# Patient Record
Sex: Male | Born: 1980 | Race: White | Hispanic: No | Marital: Single | Smoking: Current every day smoker
Health system: Southern US, Community
[De-identification: ages and names within clinical notes are randomized; demographics above are authoritative.]

## PROBLEM LIST (undated history)

## (undated) DIAGNOSIS — G43909 Migraine, unspecified, not intractable, without status migrainosus: Secondary | ICD-10-CM

## (undated) DIAGNOSIS — F329 Major depressive disorder, single episode, unspecified: Secondary | ICD-10-CM

## (undated) DIAGNOSIS — F32A Depression, unspecified: Secondary | ICD-10-CM

## (undated) DIAGNOSIS — F419 Anxiety disorder, unspecified: Secondary | ICD-10-CM

## (undated) HISTORY — DX: Migraine, unspecified, not intractable, without status migrainosus: G43.909

---

## 2004-11-25 ENCOUNTER — Emergency Department (HOSPITAL_COMMUNITY): Admission: EM | Admit: 2004-11-25 | Discharge: 2004-11-25 | Payer: Self-pay | Admitting: Emergency Medicine

## 2005-08-05 ENCOUNTER — Emergency Department (HOSPITAL_COMMUNITY): Admission: EM | Admit: 2005-08-05 | Discharge: 2005-08-06 | Payer: Self-pay | Admitting: *Deleted

## 2008-06-07 ENCOUNTER — Emergency Department (HOSPITAL_COMMUNITY): Admission: EM | Admit: 2008-06-07 | Discharge: 2008-06-07 | Payer: Self-pay | Admitting: Emergency Medicine

## 2008-06-19 ENCOUNTER — Emergency Department (HOSPITAL_COMMUNITY): Admission: EM | Admit: 2008-06-19 | Discharge: 2008-06-19 | Payer: Self-pay | Admitting: Emergency Medicine

## 2008-07-25 ENCOUNTER — Emergency Department (HOSPITAL_COMMUNITY): Admission: EM | Admit: 2008-07-25 | Discharge: 2008-07-25 | Payer: Self-pay | Admitting: Emergency Medicine

## 2008-07-26 ENCOUNTER — Emergency Department (HOSPITAL_COMMUNITY): Admission: EM | Admit: 2008-07-26 | Discharge: 2008-07-26 | Payer: Self-pay | Admitting: Emergency Medicine

## 2013-04-26 ENCOUNTER — Emergency Department (HOSPITAL_COMMUNITY)
Admission: EM | Admit: 2013-04-26 | Discharge: 2013-04-26 | Disposition: A | Discharge status: Home / Self Care | Payer: Self-pay | Attending: Emergency Medicine | Admitting: Emergency Medicine

## 2013-04-26 ENCOUNTER — Encounter (HOSPITAL_COMMUNITY): Payer: Self-pay | Admitting: *Deleted

## 2013-04-26 DIAGNOSIS — K047 Periapical abscess without sinus: Secondary | ICD-10-CM | POA: Insufficient documentation

## 2013-04-26 DIAGNOSIS — F17200 Nicotine dependence, unspecified, uncomplicated: Secondary | ICD-10-CM | POA: Insufficient documentation

## 2013-04-26 DIAGNOSIS — Z79899 Other long term (current) drug therapy: Secondary | ICD-10-CM | POA: Insufficient documentation

## 2013-04-26 DIAGNOSIS — K029 Dental caries, unspecified: Secondary | ICD-10-CM | POA: Insufficient documentation

## 2013-04-26 MED ORDER — OXYCODONE-ACETAMINOPHEN 5-325 MG PO TABS: 2.0000 | ORAL_TABLET | ORAL | Status: DC | PRN

## 2013-04-26 MED ORDER — IBUPROFEN 800 MG PO TABS: 800.0000 mg | ORAL_TABLET | Freq: Three times a day (TID) | ORAL | Status: DC

## 2013-04-26 MED ORDER — OXYCODONE-ACETAMINOPHEN 5-325 MG PO TABS
2.0000 | ORAL_TABLET | Freq: Once | ORAL | Status: AC
  Administered 2013-04-26: 2 via ORAL
  Filled 2013-04-26: qty 2

## 2013-04-26 MED ORDER — PENICILLIN V POTASSIUM 500 MG PO TABS: 500.0000 mg | ORAL_TABLET | Freq: Four times a day (QID) | ORAL | Status: AC

## 2013-04-26 NOTE — ED Notes (Signed)
Pt alert & oriented x4, stable gait. Patient given discharge instructions, paperwork & prescription(s). Patient  instructed to stop at the registration desk to finish any additional paperwork. Patient verbalized understanding. Pt left department w/ no further questions. 

## 2013-04-26 NOTE — ED Notes (Signed)
Pt reprtrs teeth hurting for 1 week. Pt has not contacted a dentist. Pt states problem is on the left top & bottom.

## 2013-04-26 NOTE — ED Provider Notes (Signed)
History     CSN: [id number]  Arrival date & time 04/26/13  0203   First MD Initiated Contact with Patient 04/26/13 [phone]      Chief Complaint  Patient presents with  . Dental Pain    (Consider location/radiation/quality/duration/timing/severity/associated sxs/prior treatment) HPI History is provided by the patient. Dental pain onset about 6 days ago progressively getting worse affecting left upper and lower mouth. No fevers. No vomiting. No difficulty swallowing or breathing. It hurts to chew. Pain is sharp in quality and not radiating. No trauma. Pain is moderate to severe. Worse tonight.  History reviewed. No pertinent past medical history.  History reviewed. No pertinent past surgical history.  No family history on file.  History  Substance Use Topics  . Smoking status: Current Every Day Smoker -- 1.00 packs/day    Types: Cigarettes  . Smokeless tobacco: Not on file  . Alcohol Use: No      Review of Systems  Constitutional: Negative for fever and chills.  HENT: Positive for dental problem. Negative for sore throat, drooling, mouth sores, neck pain and neck stiffness.   Eyes: Negative for pain.  Respiratory: Negative for shortness of breath.   Cardiovascular: Negative for chest pain.  Gastrointestinal: Negative for abdominal pain.  Genitourinary: Negative for dysuria.  Musculoskeletal: Negative for back pain.  Skin: Negative for rash.  Neurological: Negative for headaches.  All other systems reviewed and are negative.    Allergies  Review of patient's allergies indicates no known allergies.  Home Medications   Current Outpatient Rx  Name  Route  Sig  Dispense  Refill  . ibuprofen (ADVIL,MOTRIN) 800 MG tablet   Oral   Take 1 tablet (800 mg total) by mouth 3 (three) times daily.   21 tablet   0   . oxyCODONE-acetaminophen (PERCOCET/ROXICET) 5-325 MG per tablet   Oral   Take 2 tablets by mouth every 4 (four) hours as needed for pain.   6 tablet   0    . penicillin v potassium (VEETID) 500 MG tablet   Oral   Take 1 tablet (500 mg total) by mouth 4 (four) times daily.   40 tablet   0     BP 131/71  Pulse 79  Temp(Src) 97.8 F (36.6 C) (Oral)  Resp 20  Ht 5\' 10"  (1.778 m)  Wt 187 lb (84.823 kg)  BMI 26.83 kg/m2  SpO2 97%  Physical Exam  Constitutional: He is oriented to person, place, and time. He appears well-developed and well-nourished.  HENT:  Head: Normocephalic and atraumatic.  Very poor dentition, TTP L upper first molar and left lower first molar with severe decay, no gingival fluctuance. uvula midline, mild trismus  Eyes: EOM are normal. Pupils are equal, round, and reactive to light.  Neck: Neck supple.  Cardiovascular: Regular rhythm and intact distal pulses.   Pulmonary/Chest: Effort normal. No respiratory distress.  Musculoskeletal: Normal range of motion. He exhibits no edema.  Neurological: He is alert and oriented to person, place, and time.  Skin: Skin is warm and dry.    ED Course  Dental Date/Time: 04/26/2013 2:32 AM Performed by: Sunnie Nielsen Authorized by: Sunnie Nielsen Consent: Verbal consent obtained. Risks and benefits: risks, benefits and alternatives were discussed Consent given by: patient Patient understanding: patient states understanding of the procedure being performed Patient consent: the patient's understanding of the procedure matches consent given Procedure consent: procedure consent matches procedure scheduled Required items: required blood products, implants, devices, and special equipment available Patient  identity confirmed: verbally with patient Time out: Immediately prior to procedure a "time out" was called to verify the correct patient, procedure, equipment, support staff and site/side marked as required. Preparation: Patient was prepped and draped in the usual sterile fashion. Local anesthesia used: yes Local anesthetic: bupivacaine 0.5% without epinephrine Anesthetic total:  1.8 ml Patient tolerance: Patient tolerated the procedure well with no immediate complications. Comments: L lower first molar injected 27 G needle L upper first molar injected 27 G needle   (including critical care time)  Percocet by mouth  1. Dental abscess    Plan f/u DDS - referral provided. RX PCN and pain medications  Dental infection precautions provided and verbalized as understood MDM  Dental abscess Dental block Medications provided Vital signs and nursing notes reviewed and considered        Sunnie Nielsen, MD 04/26/13 [phone]

## 2013-09-01 ENCOUNTER — Encounter (HOSPITAL_COMMUNITY): Payer: Self-pay | Admitting: *Deleted

## 2013-09-01 ENCOUNTER — Emergency Department (HOSPITAL_COMMUNITY)
Admission: EM | Admit: 2013-09-01 | Discharge: 2013-09-01 | Disposition: A | Discharge status: Home / Self Care | Payer: Self-pay | Attending: Emergency Medicine | Admitting: Emergency Medicine

## 2013-09-01 DIAGNOSIS — Z792 Long term (current) use of antibiotics: Secondary | ICD-10-CM | POA: Insufficient documentation

## 2013-09-01 DIAGNOSIS — K0381 Cracked tooth: Secondary | ICD-10-CM | POA: Insufficient documentation

## 2013-09-01 DIAGNOSIS — Z791 Long term (current) use of non-steroidal anti-inflammatories (NSAID): Secondary | ICD-10-CM | POA: Insufficient documentation

## 2013-09-01 DIAGNOSIS — K029 Dental caries, unspecified: Secondary | ICD-10-CM | POA: Insufficient documentation

## 2013-09-01 DIAGNOSIS — K089 Disorder of teeth and supporting structures, unspecified: Secondary | ICD-10-CM | POA: Insufficient documentation

## 2013-09-01 DIAGNOSIS — K0889 Other specified disorders of teeth and supporting structures: Secondary | ICD-10-CM

## 2013-09-01 DIAGNOSIS — F17200 Nicotine dependence, unspecified, uncomplicated: Secondary | ICD-10-CM | POA: Insufficient documentation

## 2013-09-01 MED ORDER — HYDROCODONE-ACETAMINOPHEN 5-325 MG PO TABS: 2.0000 | ORAL_TABLET | ORAL | Status: DC | PRN

## 2013-09-01 MED ORDER — AMOXICILLIN 500 MG PO CAPS: 500.0000 mg | ORAL_CAPSULE | Freq: Three times a day (TID) | ORAL | Status: DC

## 2013-09-01 NOTE — ED Provider Notes (Signed)
CSN: [id number]     Arrival date & time 09/01/13  1738 History   First MD Initiated Contact with Patient 09/01/13 1839     Chief Complaint  Patient presents with  . Dental Pain   (Consider location/radiation/quality/duration/timing/severity/associated sxs/prior Treatment) Patient is a 32 y.o. male presenting with tooth pain. The history is provided by the patient. No language interpreter was used.  Dental Pain Location:  Upper Upper teeth location:  1/RU 3rd molar Quality:  Aching Severity:  Moderate Onset quality:  Gradual Duration:  2 days Progression:  Worsening Context: abscess and dental caries   Relieved by:  Nothing Worsened by:  Nothing tried Associated symptoms: facial pain     History reviewed. No pertinent past medical history. History reviewed. No pertinent past surgical history. No family history on file. History  Substance Use Topics  . Smoking status: Current Every Day Smoker -- 1.00 packs/day    Types: Cigarettes  . Smokeless tobacco: Not on file  . Alcohol Use: No    Review of Systems  All other systems reviewed and are negative.    Allergies  Review of patient's allergies indicates no known allergies.  Home Medications   Current Outpatient Rx  Name  Route  Sig  Dispense  Refill  . amoxicillin (AMOXIL) 500 MG capsule   Oral   Take 1 capsule (500 mg total) by mouth 3 (three) times daily.   21 capsule   0   . HYDROcodone-acetaminophen (NORCO/VICODIN) 5-325 MG per tablet   Oral   Take 2 tablets by mouth every 4 (four) hours as needed for pain.   20 tablet   0   . ibuprofen (ADVIL,MOTRIN) 800 MG tablet   Oral   Take 1 tablet (800 mg total) by mouth 3 (three) times daily.   21 tablet   0   . oxyCODONE-acetaminophen (PERCOCET/ROXICET) 5-325 MG per tablet   Oral   Take 2 tablets by mouth every 4 (four) hours as needed for pain.   6 tablet   0    BP 129/82  Pulse 92  Temp(Src) 98.2 F (36.8 C) (Oral)  Resp 16  Wt 187 lb (84.823 kg)   BMI 26.83 kg/m2  SpO2 98% Physical Exam  Nursing note and vitals reviewed. Constitutional: He appears well-developed and well-nourished.  HENT:  Head: Normocephalic and atraumatic.  Multiple broken decayed teeth.   Eyes: Pupils are equal, round, and reactive to light.  Neck: Normal range of motion.  Cardiovascular: Normal rate.   Pulmonary/Chest: Effort normal.  Musculoskeletal: Normal range of motion.  Neurological: He is alert.  Skin: Skin is warm.    ED Course  Procedures (including critical care time) Labs Review Labs Reviewed - No data to display Imaging Review No results found.  MDM   1. Tooth pain    amoxicillian and hydrocodone    Elson Areas, [place] 09/01/13 2128

## 2013-09-01 NOTE — ED Notes (Signed)
Dental pain x 2 days

## 2013-09-01 NOTE — ED Notes (Signed)
Here for dental pain. NAD

## 2013-09-03 NOTE — ED Provider Notes (Signed)
Medical screening examination/treatment/procedure(s) were performed by non-physician practitioner and as supervising physician I was immediately available for consultation/collaboration.   Alyss Granato L Sheriff Rodenberg, MD 09/03/13 0804 

## 2013-09-06 ENCOUNTER — Emergency Department (HOSPITAL_COMMUNITY)
Admission: EM | Admit: 2013-09-06 | Discharge: 2013-09-06 | Disposition: A | Discharge status: Home / Self Care | Payer: Self-pay | Attending: Emergency Medicine | Admitting: Emergency Medicine

## 2013-09-06 ENCOUNTER — Encounter (HOSPITAL_COMMUNITY): Payer: Self-pay | Admitting: Emergency Medicine

## 2013-09-06 DIAGNOSIS — K0889 Other specified disorders of teeth and supporting structures: Secondary | ICD-10-CM

## 2013-09-06 DIAGNOSIS — K029 Dental caries, unspecified: Secondary | ICD-10-CM | POA: Insufficient documentation

## 2013-09-06 DIAGNOSIS — K089 Disorder of teeth and supporting structures, unspecified: Secondary | ICD-10-CM | POA: Insufficient documentation

## 2013-09-06 DIAGNOSIS — F17200 Nicotine dependence, unspecified, uncomplicated: Secondary | ICD-10-CM | POA: Insufficient documentation

## 2013-09-06 DIAGNOSIS — Z792 Long term (current) use of antibiotics: Secondary | ICD-10-CM | POA: Insufficient documentation

## 2013-09-06 DIAGNOSIS — Z791 Long term (current) use of non-steroidal anti-inflammatories (NSAID): Secondary | ICD-10-CM | POA: Insufficient documentation

## 2013-09-06 MED ORDER — OXYCODONE-ACETAMINOPHEN 5-325 MG PO TABS
1.0000 | ORAL_TABLET | Freq: Once | ORAL | Status: AC
  Administered 2013-09-06: 1 via ORAL
  Filled 2013-09-06: qty 1

## 2013-09-06 MED ORDER — CLINDAMYCIN HCL 150 MG PO CAPS
300.0000 mg | ORAL_CAPSULE | Freq: Once | ORAL | Status: AC
  Administered 2013-09-06: 300 mg via ORAL
  Filled 2013-09-06: qty 2

## 2013-09-06 MED ORDER — CLINDAMYCIN HCL 150 MG PO CAPS: 300.0000 mg | ORAL_CAPSULE | Freq: Four times a day (QID) | ORAL | Status: DC

## 2013-09-06 MED ORDER — TRAMADOL HCL 50 MG PO TABS: 50.0000 mg | ORAL_TABLET | Freq: Four times a day (QID) | ORAL | Status: DC | PRN

## 2013-09-06 NOTE — ED Notes (Signed)
Continued dental pain since last visit.

## 2013-09-06 NOTE — ED Provider Notes (Signed)
CSN: [id number]     Arrival date & time 09/06/13  1508 History   First MD Initiated Contact with Patient 09/06/13 1620     Chief Complaint  Patient presents with  . Dental Pain   (Consider location/radiation/quality/duration/timing/severity/associated sxs/prior Treatment) HPI Comments: Angel [name] is a 32 y.o. male who presents to the Emergency Department complaining of dental pain for one week.  States the medication he received on previous visit are not helping the pain and he has ran out of the vicodin.  He states that he cannot afford dental follow-up.  Cold or hot liquids or food make the pain worse, nothing makes it better.  He denies fever, facial swelling, difficulty swallowing or breathing  Patient is a 31 y.o. male presenting with tooth pain. The history is provided by the patient.  Dental Pain Associated symptoms: no congestion, no facial swelling, no fever, no headaches and no neck pain     History reviewed. No pertinent past medical history. History reviewed. No pertinent past surgical history. No family history on file. History  Substance Use Topics  . Smoking status: Current Every Day Smoker -- 1.00 packs/day    Types: Cigarettes  . Smokeless tobacco: Not on file  . Alcohol Use: No    Review of Systems  Constitutional: Negative for fever and appetite change.  HENT: Positive for dental problem. Negative for congestion, facial swelling, sore throat and trouble swallowing.   Eyes: Negative for pain and visual disturbance.  Musculoskeletal: Negative for neck pain and neck stiffness.  Neurological: Negative for dizziness, facial asymmetry and headaches.  Hematological: Negative for adenopathy.  All other systems reviewed and are negative.    Allergies  Review of patient's allergies indicates no known allergies.  Home Medications   Current Outpatient Rx  Name  Route  Sig  Dispense  Refill  . amoxicillin (AMOXIL) 500 MG capsule   Oral   Take 1 capsule (500  mg total) by mouth 3 (three) times daily.   21 capsule   0   . HYDROcodone-acetaminophen (NORCO/VICODIN) 5-325 MG per tablet   Oral   Take 2 tablets by mouth every 4 (four) hours as needed for pain.   20 tablet   0   . ibuprofen (ADVIL,MOTRIN) 800 MG tablet   Oral   Take 1 tablet (800 mg total) by mouth 3 (three) times daily.   21 tablet   0   . oxyCODONE-acetaminophen (PERCOCET/ROXICET) 5-325 MG per tablet   Oral   Take 2 tablets by mouth every 4 (four) hours as needed for pain.   6 tablet   0    BP 117/77  Pulse 85  Temp(Src) 98 F (36.7 C) (Oral)  Resp 18  Ht 5\' 5"  (1.651 m)  Wt 182 lb (82.555 kg)  BMI 30.29 kg/m2  SpO2 100% Physical Exam  Nursing note and vitals reviewed. Constitutional: He is oriented to person, place, and time. He appears well-developed and well-nourished. No distress.  HENT:  Head: Normocephalic and atraumatic.  Right Ear: Tympanic membrane and ear canal normal.  Left Ear: Tympanic membrane and ear canal normal.  Mouth/Throat: Uvula is midline, oropharynx is clear and moist and mucous membranes are normal. No trismus in the jaw. Dental caries present. No dental abscesses or uvula swelling.  Multiple dental caries of the left upper and lower premolars and molars.  Discoloration to the remaining teeth.  No facial swelling, obvious dental abscess, trismus, or sublingual abnml. Pt handles secretions well  Neck: Normal range of motion. Neck supple.  Cardiovascular: Normal rate, regular rhythm and normal heart sounds.   No murmur heard. Pulmonary/Chest: Effort normal and breath sounds normal. No respiratory distress.  Musculoskeletal: Normal range of motion.  Lymphadenopathy:    He has no cervical adenopathy.  Neurological: He is alert and oriented to person, place, and time. He exhibits normal muscle tone. Coordination normal.  Skin: Skin is warm and dry.    ED Course  Procedures (including critical care time) Labs Review Labs Reviewed - No  data to display Imaging Review No results found.    MDM  Previous ED chart reviewed.   Multiple dental caries and ttp of the left upper and lower gums.  No drainable abscess at this time.  Pt given referral info.  Will prescribe clindamycin and ultram.  Pt understands that further narcotics are not indicated at this time.    VSS.  Pt appears stable for discharge.  Tiziana Cislo L. [place] Youngblood, PA-C 09/08/13 0016

## 2013-09-08 NOTE — ED Provider Notes (Signed)
Medical screening examination/treatment/procedure(s) were performed by non-physician practitioner and as supervising physician I was immediately available for consultation/collaboration.  Temprance Wyre R. Ostin Mathey, MD 09/08/13 1515 

## 2013-10-31 ENCOUNTER — Encounter (HOSPITAL_COMMUNITY): Payer: Self-pay | Admitting: Behavioral Health

## 2013-10-31 ENCOUNTER — Encounter (HOSPITAL_COMMUNITY): Payer: Self-pay | Admitting: Emergency Medicine

## 2013-10-31 ENCOUNTER — Emergency Department (HOSPITAL_COMMUNITY)
Admission: EM | Admit: 2013-10-31 | Discharge: 2013-10-31 | Disposition: A | Discharge status: Other Acute Inpatient Hospital | Payer: Self-pay | Attending: Emergency Medicine | Admitting: Emergency Medicine

## 2013-10-31 ENCOUNTER — Inpatient Hospital Stay (HOSPITAL_COMMUNITY)
Admission: AD | Admit: 2013-10-31 | Discharge: 2013-11-06 | DRG: 885 | Disposition: A | Discharge status: Home / Self Care | Payer: 59 | Source: Intra-hospital | Attending: Psychiatry | Admitting: Psychiatry

## 2013-10-31 DIAGNOSIS — F411 Generalized anxiety disorder: Secondary | ICD-10-CM | POA: Insufficient documentation

## 2013-10-31 DIAGNOSIS — R45851 Suicidal ideations: Secondary | ICD-10-CM

## 2013-10-31 DIAGNOSIS — X789XXA Intentional self-harm by unspecified sharp object, initial encounter: Secondary | ICD-10-CM | POA: Insufficient documentation

## 2013-10-31 DIAGNOSIS — F332 Major depressive disorder, recurrent severe without psychotic features: Principal | ICD-10-CM | POA: Diagnosis present

## 2013-10-31 DIAGNOSIS — Z5987 Material hardship due to limited financial resources, not elsewhere classified: Secondary | ICD-10-CM

## 2013-10-31 DIAGNOSIS — F17200 Nicotine dependence, unspecified, uncomplicated: Secondary | ICD-10-CM | POA: Insufficient documentation

## 2013-10-31 DIAGNOSIS — Z598 Other problems related to housing and economic circumstances: Secondary | ICD-10-CM

## 2013-10-31 DIAGNOSIS — Z79899 Other long term (current) drug therapy: Secondary | ICD-10-CM

## 2013-10-31 DIAGNOSIS — S51809A Unspecified open wound of unspecified forearm, initial encounter: Secondary | ICD-10-CM | POA: Insufficient documentation

## 2013-10-31 LAB — BASIC METABOLIC PANEL
CO2: 30 mEq/L (ref 19–32)
Calcium: 9 mg/dL (ref 8.4–10.5)
Creatinine, Ser: 0.9 mg/dL (ref 0.50–1.35)
GFR calc Af Amer: >90 mL/min (ref >90)
GFR calc non Af Amer: >90 mL/min (ref >90)
Sodium: 138 mEq/L (ref 135–145)

## 2013-10-31 LAB — CBC WITH DIFFERENTIAL/PLATELET
Basophils Absolute: 0 10*3/uL (ref 0.0–0.1)
Basophils Relative: 1 % (ref 0–1)
Eosinophils Absolute: 0.1 10*3/uL (ref 0.0–0.7)
Eosinophils Relative: 2 % (ref 0–5)
HCT: 46.5 % (ref 39.0–52.0)
Lymphocytes Relative: 43 % (ref 12–46)
MCH: 30.7 pg (ref 26.0–34.0)
MCHC: 35.1 g/dL (ref 30.0–36.0)
MCV: 87.6 fL (ref 78.0–100.0)
Monocytes Absolute: 0.5 10*3/uL (ref 0.1–1.0)
Platelets: 188 10*3/uL (ref 150–400)
RDW: 13.5 % (ref 11.5–15.5)
WBC: 5.5 10*3/uL (ref 4.0–10.5)

## 2013-10-31 LAB — HEPATIC FUNCTION PANEL
Albumin: 4 g/dL (ref 3.5–5.2)
Alkaline Phosphatase: 66 U/L (ref 39–117)
Bilirubin, Direct: <0.1 mg/dL (ref 0.0–0.3)
Total Bilirubin: 0.3 mg/dL (ref 0.3–1.2)

## 2013-10-31 LAB — ETHANOL: Alcohol, Ethyl (B): <11 mg/dL (ref 0–11)

## 2013-10-31 LAB — ACETAMINOPHEN LEVEL: Acetaminophen (Tylenol), Serum: <15 ug/mL (ref 10–30)

## 2013-10-31 MED ORDER — ALUM & MAG HYDROXIDE-SIMETH 200-200-20 MG/5ML PO SUSP: 30.0000 mL | ORAL | Status: DC | PRN

## 2013-10-31 MED ORDER — HYDROXYZINE HCL 25 MG PO TABS
25.0000 mg | ORAL_TABLET | Freq: Four times a day (QID) | ORAL | Status: DC | PRN
  Administered 2013-11-03 – 2013-11-05 (×4): 25 mg via ORAL
  Filled 2013-10-31: qty 1
  Filled 2013-10-31: qty 28
  Filled 2013-10-31 (×3): qty 1

## 2013-10-31 MED ORDER — NICOTINE 21 MG/24HR TD PT24
MEDICATED_PATCH | TRANSDERMAL | Status: AC
  Filled 2013-10-31: qty 1

## 2013-10-31 MED ORDER — ACETAMINOPHEN 325 MG PO TABS
650.0000 mg | ORAL_TABLET | Freq: Four times a day (QID) | ORAL | Status: DC | PRN
  Administered 2013-11-01 – 2013-11-03 (×2): 650 mg via ORAL
  Filled 2013-10-31 (×2): qty 2

## 2013-10-31 MED ORDER — TRAZODONE HCL 100 MG PO TABS
100.0000 mg | ORAL_TABLET | Freq: Every evening | ORAL | Status: DC | PRN
  Administered 2013-10-31: 100 mg via ORAL
  Filled 2013-10-31: qty 1

## 2013-10-31 MED ORDER — NICOTINE 21 MG/24HR TD PT24
21.0000 mg | MEDICATED_PATCH | Freq: Every day | TRANSDERMAL | Status: DC
  Administered 2013-10-31 – 2013-11-06 (×6): 21 mg via TRANSDERMAL
  Filled 2013-10-31 (×10): qty 1

## 2013-10-31 MED ORDER — BUPROPION HCL ER (XL) 150 MG PO TB24
150.0000 mg | ORAL_TABLET | Freq: Every day | ORAL | Status: DC
  Administered 2013-10-31 – 2013-11-01 (×2): 150 mg via ORAL
  Filled 2013-10-31 (×5): qty 1

## 2013-10-31 MED ORDER — BUTALBITAL-APAP-CAFFEINE 50-325-40 MG PO TABS: 1.0000 | ORAL_TABLET | Freq: Four times a day (QID) | ORAL | Status: DC | PRN

## 2013-10-31 MED ORDER — MAGNESIUM HYDROXIDE 400 MG/5ML PO SUSP: 30.0000 mL | Freq: Every day | ORAL | Status: DC | PRN

## 2013-10-31 MED ORDER — BUTALBITAL-APAP-CAFFEINE 50-325-40 MG PO TABS: 2.0000 | ORAL_TABLET | Freq: Four times a day (QID) | ORAL | Status: DC | PRN

## 2013-10-31 MED ORDER — BACITRACIN-NEOMYCIN-POLYMYXIN 400-5-5000 EX OINT
TOPICAL_OINTMENT | Freq: Two times a day (BID) | CUTANEOUS | Status: DC
  Administered 2013-10-31 – 2013-11-06 (×12): 1 via TOPICAL
  Filled 2013-10-31 (×5): qty 1

## 2013-10-31 NOTE — Progress Notes (Signed)
Patient ID: Angel [name], male   DOB: [birth year], 33 y.o.   MRN: [id number] This is a 32 year old male admitted for increased suicidal ideations after losing his job and his girlfriend. Pt mood is depressed and his affect is sad/depressed. Pt reports passive suicidal thoughts but is able to contract for safety. Pt denies drug and ETOH use. Pt has a hx of migraine HA. Pt has poor eye contact and forwards little to staff. Food and drink were provided. Writer oriented pt to the milieu and 15 minute checks were intiated for safety. Also, pt has a small self inflicted laceration to left forearm that reopened after taking a shower. Provider notified.
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
## 2013-10-31 NOTE — BH Assessment (Signed)
Tele Assessment Note   Angel [name] is an 32 y.o. male that was assessed via tele assessment this day per EDP Rancour.  This clinician obtained clinical information from EDP Rancour @ [phone].  Pt presents with suicide attempt by cutting his right arm superficially with broken glass.  Pt is still suicidal and unable to contract for safety.  Pt has a hx of a previous suicide attempt.  Pt stated he has been feeling sucidal for about 2 mos, since a break up and losing his job, but has been feeling depressed for about one year by report.  Pt has had no other mental health treatment other than being hospitalized at [facility] at age 50 for a previous suicide attempt due to depression.  Pt denies HI or psychosis.  Pt denies SA.  Pt is tearful and presents with depressed affect during assessment.  Pt has little support and is living with friends.  Pt stated he wants help.  Pt stated his only medical problem is migraines and pt cannot remember the medication he takes for this.  Pt self-referred to [facility].  Completed tele assessment, consulted with [facility] Berneice Heinrich and pt ran by Nanine Means, NP, @ [phone], who accepted pt to bed 506-2 to Dr. Elsie Saas.  Informed pt's nurse, Darel Hong, and she will arrange transport with [facility] to [facility] once pt's voluntary paperwork obtained.  Updated TTS staff and [facility] staff, including EDp Rancour.   Axis I: 296.33 Major Depressive Disorder, Recurrent, Severe Without Psychotic Features Axis II: Deferred Axis III: History reviewed. No pertinent past medical history. Axis IV: economic problems, housing problems, occupational problems, other psychosocial or environmental problems, problems related to social environment, problems with access to health care services and problems with primary support group Axis V: 21-30 behavior considerably influenced by delusions or hallucinations OR serious impairment in judgment, communication OR inability to function in almost all areas  Past  Medical History: History reviewed. No pertinent past medical history.  History reviewed. No pertinent past surgical history.  Family History: No family history on file.  Social History:  reports that he has been smoking Cigarettes.  He has been smoking about 1.00 pack per day. He does not have any smokeless tobacco history on file. He reports that he does not drink alcohol or use illicit drugs.  Additional Social History:  Alcohol / Drug Use Pain Medications: see MAR Prescriptions: see MAR Over the Counter: see MAR History of alcohol / drug use?: No history of alcohol / drug abuse Longest period of sobriety (when/how long):  (na) Negative Consequences of Use:  (na) Withdrawal Symptoms:  (na)  CIWA: CIWA-Ar BP: 107/74 mmHg Pulse Rate: 99 COWS:    Allergies: No Known Allergies  Home Medications:  (Not in a hospital admission)  OB/GYN Status:  No LMP for male patient.  General Assessment Data Location of Assessment: [facility] Is this a Tele or Face-to-Face Assessment?: Tele Assessment Is this an Initial Assessment or a Re-assessment for this encounter?: Initial Assessment Living Arrangements: Non-relatives/Friends Can pt return to current living arrangement?: Yes Admission Status: Voluntary Is patient capable of signing voluntary admission?: Yes Transfer from: Acute Hospital Referral Source: Self/Family/Friend  Medical Screening Exam [facility] Walk-in ONLY) Medical Exam completed: No Reason for MSE not completed: Other: (pt med cleared at [facility])  [facility] Crisis Care Plan Living Arrangements: Non-relatives/Friends Name of Psychiatrist: none Name of Therapist: none  Education Status Is patient currently in school?: No  Risk to self Suicidal Ideation: Yes-Currently Present  Suicidal Intent: Yes-Currently Present Is patient at risk for suicide?: Yes Suicidal Plan?: Yes-Currently Present Specify Current Suicidal Plan: pt cut self on arm superficially with glass Access to Means:  Yes Specify Access to Suicidal Means: sharps What has been your use of drugs/alcohol within the last 12 months?: pt denies Previous Attempts/Gestures: Yes How many times?: 1 (Age 43 - tried to cut wrists) Other Self Harm Risks: pt denies Triggers for Past Attempts: Other (Comment) (Depression) Intentional Self Injurious Behavior: None Family Suicide History: No Recent stressful life event(s): Loss (Comment);Financial Problems;Turmoil (Comment) (SI, recent breakup, no job, living with friends) Persecutory voices/beliefs?: No Depression: Yes Depression Symptoms: Despondent;Insomnia;Tearfulness;Isolating;Fatigue;Loss of interest in usual pleasures;Feeling worthless/self pity Substance abuse history and/or treatment for substance abuse?: No Suicide prevention information given to non-admitted patients: Not applicable  Risk to Others Homicidal Ideation: No Thoughts of Harm to Others: No Current Homicidal Intent: No Current Homicidal Plan: No Access to Homicidal Means: No Identified Victim: pt denies History of harm to others?: No Assessment of Violence: None Noted Violent Behavior Description: na - pt calm, cooperative Does patient have access to weapons?: No Criminal Charges Pending?: No Does patient have a court date: No  Psychosis Hallucinations: None noted Delusions: None noted  Mental Status Report Appear/Hygiene: Disheveled Eye Contact: Good Motor Activity: Freedom of movement;Unremarkable Speech: Logical/coherent;Soft Level of Consciousness: Quiet/awake;Crying Mood: Depressed;Sad Affect: Depressed;Sad;Appropriate to circumstance Anxiety Level: None Thought Processes: Coherent;Relevant Judgement: Unimpaired Orientation: Person;Place;Time;Situation;Appropriate for developmental age Obsessive Compulsive Thoughts/Behaviors: None  Cognitive Functioning Concentration: Decreased Memory: Recent Intact;Remote Intact IQ: Average Insight: Poor Impulse Control:  Fair Appetite: Fair Weight Loss:  (pt stated he has lost, but not sure how much) Weight Gain: 0 Sleep: Decreased Total Hours of Sleep:  (Between 4-8 hrs per night) Vegetative Symptoms: Staying in bed;Not bathing;Decreased grooming  ADLScreening [facility] Assessment Services) Patient's cognitive ability adequate to safely complete daily activities?: Yes Patient able to express need for assistance with ADLs?: Yes Independently performs ADLs?: Yes (appropriate for developmental age)  Prior Inpatient Therapy Prior Inpatient Therapy: Yes Prior Therapy Dates: Age 68 Prior Therapy Facilty/Provider(s): [facility] Reason for Treatment: Suicide attempt  Prior Outpatient Therapy Prior Outpatient Therapy: No Prior Therapy Dates: na Prior Therapy Facilty/Provider(s): na Reason for Treatment: na  ADL Screening (condition at time of admission) Patient's cognitive ability adequate to safely complete daily activities?: Yes Is the patient deaf or have difficulty hearing?: No Does the patient have difficulty seeing, even when wearing glasses/contacts?: No Does the patient have difficulty concentrating, remembering, or making decisions?: No Patient able to express need for assistance with ADLs?: Yes Does the patient have difficulty dressing or bathing?: No Independently performs ADLs?: Yes (appropriate for developmental age) Does the patient have difficulty walking or climbing stairs?: No  Home Assistive Devices/Equipment Home Assistive Devices/Equipment: None    Abuse/Neglect Assessment (Assessment to be complete while patient is alone) Physical Abuse: Denies Verbal Abuse: Denies Sexual Abuse: Denies Exploitation of patient/patient's resources: Denies Self-Neglect: Denies Values / Beliefs Cultural Requests During Hospitalization: None Spiritual Requests During Hospitalization: None Consults Spiritual Care Consult Needed: No Social Work Consult Needed: No Merchant navy officer (For  Healthcare) Advance Directive: Patient does not have advance directive;Patient would not like information    Additional Information 1:1 In Past 12 Months?: No CIRT Risk: No Elopement Risk: No Does patient have medical clearance?: Yes     Disposition:  Disposition Initial Assessment Completed for this Encounter: Yes Disposition of Patient: Inpatient treatment program Type of inpatient treatment program: Adult (Pt accepted  [facility])  Hetty Ely [facility] 10/31/2013 10:36 AM

## 2013-10-31 NOTE — ED Notes (Signed)
Pt has been accepted at [facility].  

## 2013-10-31 NOTE — ED Notes (Signed)
I spoke with [facility] at this time will send truck within 45 mins. Felicia Both

## 2013-10-31 NOTE — ED Notes (Signed)
Pt states a lot of things have happened in his life but will not say what. States " life stinks " pt avoids direst eye contact. Pt made aware of guidelines pertaining to his care

## 2013-10-31 NOTE — Tx Team (Signed)
Initial Interdisciplinary Treatment Plan  PATIENT STRENGTHS: (choose at least two) Average or above average intelligence Capable of independent living Motivation for treatment/growth  PATIENT STRESSORS: Loss of Girlfriend and job   PROBLEM LIST: Problem List/Patient Goals Date to be addressed Date deferred Reason deferred Estimated date of resolution  Suicidal Ideations 10/31/2013   11/07/2013  Depression 10/31/2013   11/07/2013  Loss of Relationship and Job 10/31/2013   11/07/2013                                       DISCHARGE CRITERIA:  Improved stabilization in mood, thinking, and/or behavior Motivation to continue treatment in a less acute level of care Need for constant or close observation no longer present Reduction of life-threatening or endangering symptoms to within safe limits Verbal commitment to aftercare and medication compliance  PRELIMINARY DISCHARGE PLAN: Outpatient therapy Return to previous living arrangement  PATIENT/FAMIILY INVOLVEMENT: This treatment plan has been presented to and reviewed with the patient, Angel [name], and/or family member.  The patient and family have been given the opportunity to ask questions and make suggestions.  Bryan Omura Shari Prows 10/31/2013, 2:31 PM

## 2013-10-31 NOTE — ED Provider Notes (Signed)
CSN: [id number]     Arrival date & time 10/31/13  4098 History  This chart was scribed for Glynn Octave, MD by Leone Payor, ED Scribe. This patient was seen in room APA15/[facility] and the patient's care was started 8:59 AM.    Chief Complaint  Patient presents with  . V70.1    HPI  HPI Comments: Angel [name] is a 32 y.o. male who presents to the Emergency Department complaining of ongoing, constant, unchanged SI that began about 1 month ago. Pt states he has a suicide plan that involved cutting his wrist. He reports cutting his left forearm this morning with a piece of broken glass. He denies having a history of depression or taking any medications for this. He denies taking any medications that were not prescribed to him. Pt states he lives with friends and states there is not a gun in the house. He denies HI, auditory or visual hallucinations.   History reviewed. No pertinent past medical history. History reviewed. No pertinent past surgical history. No family history on file. History  Substance Use Topics  . Smoking status: Current Every Day Smoker -- 1.00 packs/day    Types: Cigarettes  . Smokeless tobacco: Not on file  . Alcohol Use: No    Review of Systems A complete 10 system review of systems was obtained and all systems are negative except as noted in the HPI and PMH.   Allergies  Review of patient's allergies indicates no known allergies.  Home Medications   No current outpatient prescriptions on file. BP 113/81  Pulse 73  Temp(Src) 98.1 F (36.7 C) (Oral)  Resp 18  Wt 175 lb (79.379 kg)  SpO2 98% Physical Exam  Nursing note and vitals reviewed. Constitutional: He is oriented to person, place, and time. He appears well-developed and well-nourished. No distress.  HENT:  Head: Normocephalic and atraumatic.  Eyes: EOM are normal.  Neck: Neck supple. No tracheal deviation present.  Cardiovascular: Normal rate.   Pulmonary/Chest: Effort normal. No respiratory  distress.  Musculoskeletal: Normal range of motion.  Neurological: He is alert and oriented to person, place, and time.  Skin: Skin is warm and dry.  Mild superficial lacerations to the right forearm.   Psychiatric: His behavior is normal. His mood appears anxious.  Tearful and anxious.     ED Course  Procedures   DIAGNOSTIC STUDIES: Oxygen Saturation is 100% on RA, normal by my interpretation.    COORDINATION OF CARE: 9:00 AM Will order CBC, BMP, and drug screen. Discussed treatment plan with pt at bedside and pt agreed to plan.  Labs Review Labs Reviewed  SALICYLATE LEVEL - Abnormal; Notable for the following:    Salicylate Lvl <2.0 (*)    All other components within normal limits  CBC WITH DIFFERENTIAL  BASIC METABOLIC PANEL  ETHANOL  ACETAMINOPHEN LEVEL  HEPATIC FUNCTION PANEL  URINE RAPID DRUG SCREEN (HOSP PERFORMED)   Imaging Review No results found.  EKG Interpretation   None       MDM   1. Suicidal ideation    Suicidal thoughts with depression and plan a slight wrists. Denies HIV hallucinations. Denies alcohol or drug abuse.  Small laceration to right forearm. Tetanus is up-to-date.  Screen labs are unremarkable. Patient accepted at behavioral health hospital by Dr. Shela Commons.   I personally performed the services described in this documentation, which was scribed in my presence. The recorded information has been reviewed and is accurate.   Glynn Octave, MD 10/31/13 [phone]

## 2013-10-31 NOTE — [facility] Counselor (Signed)
Adult Comprehensive Assessment  Patient ID: WILLETT LEFEBER, male   DOB: 1981-04-28, 32 y.o.   MRN: [id number]  Information Source:    Current Stressors:  Educational / Learning stressors: None Employment / Job issues: Patient has been unemployed for six months Family Relationships: Family does not contact him unless they have a need Surveyor, quantity / Lack of resources (include bankruptcy): Struggling due to being out of work Housing / Lack of housing: none Physical health (include injuries & life threatening diseases): Non Social relationships: Does not like ot be in crowds Substance abuse: Denies Bereavement / Loss: Denies  Living/Environment/Situation:  Living Arrangements: Non-relatives/Friends Living conditions (as described by patient or guardian): Not good How long has patient lived in current situation?: three months What is atmosphere in current home: Temporary  Family History:  Marital status: Single Does patient have children?: No  Childhood History:  By whom was/is the patient raised?: Both parents Additional childhood history information: Patient reports having a poor and rough childhood Description of patient's relationship with caregiver when they were a child: Good with mother but father was never at home Patient's description of current relationship with people who raised him/her: No relationship with father - mother is deceased Does patient have siblings?: Yes Number of Siblings: 3 Description of patient's current relationship with siblings: Okay relationships Did patient suffer any verbal/emotional/physical/sexual abuse as a child?: No Did patient suffer from severe childhood neglect?: No Has patient ever been sexually abused/assaulted/raped as an adolescent or adult?: No Was the patient ever a victim of a crime or a disaster?: No Witnessed domestic violence?: No Has patient been effected by domestic violence as an adult?: No  Education:  Highest grade of school  patient has completed: 9th Currently a Consulting civil engineer?: No Learning disability?: No  Employment/Work Situation:   Employment situation: Unemployed Patient's job has been impacted by current illness: No What is the longest time patient has a held a job?: 20 years 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 Has patient ever been in the [employer]?: No Has patient ever served in Buyer, retail?: No  Financial Resources:   Surveyor, quantity resources: No income Does patient have a Lawyer or guardian?: No  Alcohol/Substance Abuse:   What has been your use of drugs/alcohol within the last 12 months?: Patient denies If attempted suicide, did drugs/alcohol play a role in this?: No Alcohol/Substance Abuse Treatment Hx: Denies past history Has alcohol/substance abuse ever caused legal problems?: No  Social Support System:   Forensic psychologist System: None Describe Community Support System: N/A Type of faith/religion: None How does patient's faith help to cope with current illness?: N/A  Leisure/Recreation:   Leisure and Hobbies: None  Strengths/Needs:   What things does the patient do well?: Kind hearted In what areas does patient struggle / problems for patient: Everything  Discharge Plan:   Does patient have access to transportation?: Yes Will patient be returning to same living situation after discharge?: Yes Currently receiving community mental health services: No If no, would patient like referral for services when discharged?: Yes (What county?) ([facility] [place]) Does patient have financial barriers related to discharge medications?: Yes Patient description of barriers related to discharge medications: No income or insurance.  Summary/Recommendations:  Jaelon Gatley is a 32 year old male admitted with Major Depression Disorder.  He will benefit from crisis stabilization, evaluation for medication, psycho-education groups for coping skills development, group therapy  and case management for discharge planning.     Allana Shrestha, Joesph July.  10/31/2013 

## 2013-10-31 NOTE — ED Notes (Signed)
Pt c/o feeling depressed and having SI.  Says has been thinking of cutting his wrists for the past 2 months.  EMS reports vss.  Denies HI.  Pt reports cut left forearm this morning with a piece of broken glass.  Small laceration noted.  No bleeding at this time.

## 2013-10-31 NOTE — ED Notes (Signed)
TTS being done 

## 2013-10-31 NOTE — ED Notes (Signed)
Unable to give report at present. Nurse not available 

## 2013-10-31 NOTE — [facility] Group Notes (Signed)
[facility] LCSW Group Therapy  Feelings Around Relapse  10/31/2013 2:25 PM  Type of Therapy:  Group Therapy  Participation Level:  Did Not Attend   Wynn Banker 10/31/2013, 2:25 PM

## 2013-11-01 ENCOUNTER — Encounter (HOSPITAL_COMMUNITY): Payer: Self-pay | Admitting: Psychiatry

## 2013-11-01 DIAGNOSIS — F411 Generalized anxiety disorder: Secondary | ICD-10-CM | POA: Diagnosis present

## 2013-11-01 MED ORDER — SERTRALINE HCL 25 MG PO TABS
25.0000 mg | ORAL_TABLET | Freq: Every day | ORAL | Status: DC
  Administered 2013-11-01 – 2013-11-03 (×3): 25 mg via ORAL
  Filled 2013-11-01 (×5): qty 1

## 2013-11-01 MED ORDER — PNEUMOCOCCAL VAC POLYVALENT 25 MCG/0.5ML IJ INJ
0.5000 mL | INJECTION | INTRAMUSCULAR | Status: AC
  Administered 2013-11-02: 0.5 mL via INTRAMUSCULAR

## 2013-11-01 MED ORDER — TRAZODONE HCL 50 MG PO TABS
50.0000 mg | ORAL_TABLET | Freq: Every evening | ORAL | Status: DC | PRN
  Administered 2013-11-01: 50 mg via ORAL
  Filled 2013-11-01: qty 1

## 2013-11-01 NOTE — Progress Notes (Signed)
D) Pt has attended the groups and interacts with select peers. Affect is blunted and mood depressed. Rates his depression at a 53, his hopelessness at a 6 and states he has had thoughts of SI on and off. Has attended the groups, but offers little in them. Today in goals group, Pt stated that he did not have any goals. C?O a headache this morning and was given tylenol. A) Pt given support when appropriate. Encouraged to attend the groups. Given praise when appropriate. Admits to Si on and off and contracts for safety.

## 2013-11-01 NOTE — Progress Notes (Signed)
Writer spoke with patient 1:1 and he reports that he has seen better day when writer asked how his day has been. Patient has a flat, sad affect and currently reports that he needs to find a job. 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 Patient is focused on the need to find a job and reports that he had an interview with [employer] in his home town but had no luck with being hired. Patient reports passive si and verbally contracts for safety, denies hi/a/v hallucinations. Encouraged patient to attend groups and listen in if not feeling up to participating. Patient was receptive. Safety maintained on unit with 15 min checks.

## 2013-11-01 NOTE — Progress Notes (Signed)
Adult Psychoeducational Group Note  Date:  11/01/2013 Time:  2:26 PM  Group Topic/Focus:  Healthy Communication:   The focus of this group is to discuss communication, barriers to communication, as well as healthy ways to communicate with others.  Participation Level:  Minimal  Participation Quality:  Resistant  Affect:  Depressed and Flat  Cognitive:  Appropriate  Insight: Lacking  Engagement in Group:  Limited  Modes of Intervention:  Activity and Discussion  Additional Comments:  Pt was resistant to open up and share during group session. Pt does not communicate or interact w/ peers. Pt insist he is "okay" and is hesitant to open up.   Roanna Banning, [place] N 11/01/2013, 2:26 PM

## 2013-11-01 NOTE — [facility] Group Notes (Signed)
[facility] Group Notes:  (Clinical Social Work)  11/01/2013   3:00-4:00PM  Summary of Progress/Problems:   The main focus of today's process group was for the patient to identify ways in which they have sabotaged their own mental health wellness/recovery.  Motivational interviewing was used to explore the reasons they engage in this behavior, and reasons they may have for wanting to change.  The Stages of Change were explained to the group using a handout, and patients identified where they are with regard to changing self-defeating behaviors.  The patient expressed that he is in the hospital for a suicide attempt, but he declined participating in the group discussion.  He did say at the end of the group that he is in the Contemplation Stage of Change.  Type of Therapy:  Process Group  Participation Level:  Active  Participation Quality:  Attentive  Affect:  Anxious, Depressed and Flat  Cognitive:  Alert  Insight:  Improving  Engagement in Therapy:  Improving  Modes of Intervention:  Education, Motivational Interviewing   Angel Mantle, LCSW 11/01/2013, 4:00pm

## 2013-11-01 NOTE — [facility] Group Notes (Signed)
[facility] Group Notes:  (Nursing/MHT/Case Management/Adjunct)  Date:  11/01/2013  Time:  2:44 PM  Type of Therapy:  Nurse Education  Participation Level:  Minimal  Participation Quality:  Appropriate  Affect:  Appropriate  Cognitive:  Appropriate  Insight:  Limited  Engagement in Group:  Lacking  Modes of Intervention:  Education  Summary of Progress/Problems:pt stated he did not have a goal for today  Nicole Cella 11/01/2013, 2:44 PM

## 2013-11-01 NOTE — Progress Notes (Signed)
Adult Psychoeducational Group Note  Date:  11/01/2013 Time:  8:00 pm  Group Topic/Focus:  Wrap-Up Group:   The focus of this group is to help patients review their daily goal of treatment and discuss progress on daily workbooks.  Participation Level:  Active  Participation Quality:  Resistant  Affect:  Resistant  Cognitive:  Lacking  Insight: Limited  Engagement in Group:  Resistant  Modes of Intervention:  Discussion, Education, Socialization and Support  Additional Comments:  Pt decided to not share anything during group. Pt did not speak.   Laural Benes, Annarose Ouellet 11/01/2013, 11:24 PM

## 2013-11-01 NOTE — [facility] Suicide Risk Assessment (Signed)
Suicide Risk Assessment  Admission Assessment     Nursing information obtained from:  Patient Demographic factors:  Male;Adolescent or young adult;Caucasian;Low socioeconomic status;Unemployed Current Mental Status:  Suicidal ideation indicated by patient;Suicide plan;Plan includes specific time, place, or method;Self-harm thoughts;Intention to act on suicide plan Loss Factors:  Decrease in vocational status Historical Factors:  Prior suicide attempts Risk Reduction Factors:  Living with another person, especially a relative  CLINICAL FACTORS:   Severe Anxiety and/or Agitation Depression:   Anhedonia Comorbid alcohol abuse/dependence Hopelessness Impulsivity Insomnia Dysthymia Alcohol/Substance Abuse/Dependencies Unstable or Poor Therapeutic Relationship Previous Psychiatric Diagnoses and Treatments  COGNITIVE FEATURES THAT CONTRIBUTE TO RISK:  Closed-mindedness Polarized thinking Thought constriction (tunnel vision)    SUICIDE RISK:   Severe:  Frequent, intense, and enduring suicidal ideation, specific plan, no subjective intent, but some objective markers of intent (i.e., choice of lethal method), the method is accessible, some limited preparatory behavior, evidence of impaired self-control, severe dysphoria/symptomatology, multiple risk factors present, and few if any protective factors, particularly a lack of social support.  PLAN OF CARE:  I certify that inpatient services furnished can reasonably be expected to improve the patient's condition.  Derrill Bagnell 11/01/2013, 11:23 AM

## 2013-11-01 NOTE — Progress Notes (Signed)
Writer observed patient standing outside the dayroom in the hallway. Writer asked how his day had been and patient reports that it has been about the same as yesterday. Patient has flat, sad affect and in our conversation is still very hopeless about his life currently. Patient talks about how he has lost everything for the second time and blames his brother for most of it. Writer spent 1:1 time with patient and suggested that he look into outpatient therapy once discharged and he is agreeable. Patient reports that he has a lot of stuff on his mind and doesn't really have anyone to talk to. Patient reports passive si and verbally contracts for safety, denies hi/a/v hallucinations. Safety maintained on unit with 15 min checks.

## 2013-11-01 NOTE — Progress Notes (Signed)
[facility] MD Progress Note  11/01/2013 9:57 AM Angel [name]  MRN:  [id number] Subjective:  Patient was pacing the floors this morning--discontinued his Wellbutrin and Zoloft started--Vistaril in place PRN for anxiety.  Sleep was fair, appetite is poor, patient remains severely depressed with a congruent affect, denies physical pain.  Dressing to right forearm dry and intact with no signs or symptoms of infections--monitoring continues. Diagnosis:   DSM5:  Depressive Disorders:  Major Depressive Disorder - Severe (296.23)  Axis I: Anxiety Disorder NOS and Major Depression, Recurrent severe Axis II: Deferred Axis III: History reviewed. No pertinent past medical history. Axis IV: economic problems, housing problems, occupational problems, other psychosocial or environmental problems, problems related to social environment and problems with primary support group Axis V: 41-50 serious symptoms  ADL's:  Intact  Sleep: Fair  Appetite:  Poor  Suicidal Ideation:  Plan:  cut himself Intent:  yes Means:  none Homicidal Ideation:  None  Psychiatric Specialty Exam: Review of Systems  Constitutional: Negative.   HENT: Negative.   Eyes: Negative.   Respiratory: Negative.   Cardiovascular: Negative.   Gastrointestinal: Negative.   Genitourinary: Negative.   Musculoskeletal: Negative.   Skin: Negative.   Neurological: Negative.   Endo/Heme/Allergies: Negative.   Psychiatric/Behavioral: Positive for depression and suicidal ideas. The patient is nervous/anxious.     Blood pressure 90/67, pulse 112, temperature 98 F (36.7 C), resp. rate 18.There is no weight on file to calculate BMI.  General Appearance: Casual  Eye Contact::  Minimal  Speech:  Normal Rate  Volume:  Normal  Mood:  Anxious and Depressed  Affect:  Congruent  Thought Process:  Coherent  Orientation:  Full (Time, Place, and Person)  Thought Content:  WDL  Suicidal Thoughts:  Yes.  with intent/plan  Homicidal Thoughts:   No  Memory:  Immediate;   Fair Recent;   Fair Remote;   Fair  Judgement:  Poor  Insight:  Lacking  Psychomotor Activity:  Increased  Concentration:  Fair  Recall:  Fair  Akathisia:  No  Handed:  Right  AIMS (if indicated):     Assets:  Leisure Time Physical Health Resilience Social Support  Sleep:      Current Medications: Current Facility-Administered Medications  Medication Dose Route Frequency Provider Last Rate Last Dose  . acetaminophen (TYLENOL) tablet 650 mg  650 mg Oral Q6H PRN Nanine Means, NP      . alum & mag hydroxide-simeth (MAALOX/MYLANTA) 200-200-20 MG/5ML suspension 30 mL  30 mL Oral Q4H PRN Nanine Means, NP      . butalbital-acetaminophen-caffeine (FIORICET, ESGIC) 50-325-40 MG per tablet 2 tablet  2 tablet Oral Q6H PRN Nanine Means, NP      . hydrOXYzine (ATARAX/VISTARIL) tablet 25 mg  25 mg Oral Q6H PRN Nanine Means, NP      . magnesium hydroxide (MILK OF MAGNESIA) suspension 30 mL  30 mL Oral Daily PRN Nanine Means, NP      . neomycin-bacitracin-polymyxin (NEOSPORIN) ointment   Topical BID Nanine Means, NP   1 application at 11/01/13 0835  . nicotine (NICODERM CQ - dosed in mg/24 hours) patch 21 mg  21 mg Transdermal Q0600 Nehemiah Settle, MD   21 mg at 11/01/13 4742  . sertraline (ZOLOFT) tablet 25 mg  25 mg Oral Daily Nanine Means, NP      . traZODone (DESYREL) tablet 50 mg  50 mg Oral QHS PRN Nanine Means, NP        Lab Results:  Results  for orders placed during the hospital encounter of 10/31/13 (from the past 48 hour(s))  CBC WITH DIFFERENTIAL     Status: None   Collection Time    10/31/13  9:04 AM      Result Value Range   WBC 5.5  4.0 - 10.5 K/uL   RBC 5.31  4.22 - 5.81 MIL/uL   Hemoglobin 16.3  13.0 - 17.0 g/dL   HCT 16.1  09.6 - 04.5 %   MCV 87.6  78.0 - 100.0 fL   MCH 30.7  26.0 - 34.0 pg   MCHC 35.1  30.0 - 36.0 g/dL   RDW 40.9  81.1 - 91.4 %   Platelets 188  150 - 400 K/uL   Neutrophils Relative % 46  43 - 77 %   Neutro Abs 2.5   1.7 - 7.7 K/uL   Lymphocytes Relative 43  12 - 46 %   Lymphs Abs 2.4  0.7 - 4.0 K/uL   Monocytes Relative 9  3 - 12 %   Monocytes Absolute 0.5  0.1 - 1.0 K/uL   Eosinophils Relative 2  0 - 5 %   Eosinophils Absolute 0.1  0.0 - 0.7 K/uL   Basophils Relative 1  0 - 1 %   Basophils Absolute 0.0  0.0 - 0.1 K/uL  BASIC METABOLIC PANEL     Status: None   Collection Time    10/31/13  9:04 AM      Result Value Range   Sodium 138  135 - 145 mEq/L   Potassium 3.5  3.5 - 5.1 mEq/L   Chloride 100  96 - 112 mEq/L   CO2 30  19 - 32 mEq/L   Glucose, Bld 93  70 - 99 mg/dL   BUN 8  6 - 23 mg/dL   Creatinine, Ser 7.82  0.50 - 1.35 mg/dL   Calcium 9.0  8.4 - 95.6 mg/dL   GFR calc non Af Amer >90  >90 mL/min   GFR calc Af Amer >90  >90 mL/min   Comment: (NOTE)     The eGFR has been calculated using the CKD EPI equation.     This calculation has not been validated in all clinical situations.     eGFR's persistently <90 mL/min signify possible Chronic Kidney     Disease.  ETHANOL     Status: None   Collection Time    10/31/13  9:04 AM      Result Value Range   Alcohol, Ethyl (B) <11  0 - 11 mg/dL   Comment:            LOWEST DETECTABLE LIMIT FOR     SERUM ALCOHOL IS 11 mg/dL     FOR MEDICAL PURPOSES ONLY  ACETAMINOPHEN LEVEL     Status: None   Collection Time    10/31/13  9:04 AM      Result Value Range   Acetaminophen (Tylenol), Serum <15.0  10 - 30 ug/mL   Comment:            THERAPEUTIC CONCENTRATIONS VARY     SIGNIFICANTLY. A RANGE OF 10-30     ug/mL MAY BE AN EFFECTIVE     CONCENTRATION FOR MANY PATIENTS.     HOWEVER, SOME ARE BEST TREATED     AT CONCENTRATIONS OUTSIDE THIS     RANGE.     ACETAMINOPHEN CONCENTRATIONS     >150 ug/mL AT 4 HOURS AFTER     INGESTION AND >50 ug/mL  AT 12     HOURS AFTER INGESTION ARE     OFTEN ASSOCIATED WITH TOXIC     REACTIONS.  SALICYLATE LEVEL     Status: Abnormal   Collection Time    10/31/13  9:04 AM      Result Value Range   Salicylate  Lvl <2.0 (*) 2.8 - 20.0 mg/dL  HEPATIC FUNCTION PANEL     Status: None   Collection Time    10/31/13  9:04 AM      Result Value Range   Total Protein 6.9  6.0 - 8.3 g/dL   Albumin 4.0  3.5 - 5.2 g/dL   AST 13  0 - 37 U/L   ALT 15  0 - 53 U/L   Alkaline Phosphatase 66  39 - 117 U/L   Total Bilirubin 0.3  0.3 - 1.2 mg/dL   Bilirubin, Direct <1.6  0.0 - 0.3 mg/dL   Indirect Bilirubin NOT CALCULATED  0.3 - 0.9 mg/dL    Physical Findings: AIMS: Facial and Oral Movements Muscles of Facial Expression: None, normal Lips and Perioral Area: None, normal Jaw: None, normal Tongue: None, normal,Extremity Movements Upper (arms, wrists, hands, fingers): None, normal Lower (legs, knees, ankles, toes): None, normal, Trunk Movements Neck, shoulders, hips: None, normal, Overall Severity Incapacitation due to abnormal movements: None, normal, Dental Status Current problems with teeth and/or dentures?: No Does patient usually wear dentures?: No  CIWA:    COWS:     Treatment Plan Summary: Daily contact with patient to assess and evaluate symptoms and progress in treatment Medication management  Plan:  Review of chart, vital signs, medications, and notes. 1-Individual and group therapy 2-Medication management for depression and anxiety:  Medications reviewed with the patient and due to his increased anxiety, Wellbutrin changed to Zoloft 3-Coping skills for depression, anxiety 4-Continue crisis stabilization and management 5-Address health issues--monitoring vital signs, stable 6-Treatment plan in progress to prevent relapse of depression and anxiety  Medical Decision Making Problem Points:  Established problem, stable/improving (1) and Review of psycho-social stressors (1) Data Points:  Review of new medications or change in dosage (2)  I certify that inpatient services furnished can reasonably be expected to improve the patient's condition.   Nanine Means, PMH-NP 11/01/2013, 9:57 AM

## 2013-11-02 MED ORDER — TRAZODONE HCL 100 MG PO TABS: 100.0000 mg | ORAL_TABLET | Freq: Every evening | ORAL | Status: DC | PRN

## 2013-11-02 MED ORDER — MIRTAZAPINE 15 MG PO TABS
15.0000 mg | ORAL_TABLET | Freq: Every day | ORAL | Status: DC
  Administered 2013-11-02 – 2013-11-04 (×3): 15 mg via ORAL
  Filled 2013-11-02 (×4): qty 1

## 2013-11-02 NOTE — Progress Notes (Signed)
Psychoeducational Group Note  Date: 11/02/2013 Time: 1015  Group Topic/Focus:  Making Healthy Choices:   The focus of this group is to help patients identify negative/unhealthy choices they were using prior to admission and identify positive/healthier coping strategies to replace them upon discharge.  Participation Level:  Active  Participation Quality:  Appropriate  Affect:  Appropriate  Cognitive:  Appropriate  Insight:  Engaged  Engagement in Group:  Engaged  Additional Comments:    11/02/2013,7:44 PM Satoru Milich, Joie Bimler

## 2013-11-02 NOTE — Progress Notes (Signed)
Adult Psychoeducational Group Note  Date:  11/02/2013 Time: 08:00pm Group Topic/Focus:  Wrap-Up Group:   The focus of this group is to help patients review their daily goal of treatment and discuss progress on daily workbooks.  Participation Level:  Minimal  Participation Quality:  Appropriate and Attentive  Affect:  Flat  Cognitive:  Alert and Appropriate  Insight: Limited  Engagement in Group:  Limited  Modes of Intervention:  Discussion and Education  Additional Comments: Pt attended and participated in group. Wrap up group consisted of going over unit rules and asking how their day went and if they had any concerns or questions. Pt stated his day went ok just having his ups and downs.   Shelly Bombard D 11/02/2013, 9:30 PM

## 2013-11-02 NOTE — Progress Notes (Signed)
Psychoeducational Group Note  Date: 11/02/2013 Time:  0930  Group Topic/Focus:  Gratefulness:  The focus of this group is to help patients identify what two things they are most grateful for in their lives. What helps ground them and to center them on their work to their recovery.  Participation Level:  Active  Participation Quality:  Appropriate  Affect:  Appropriate  Cognitive:  Oriented  Insight:  Lacking  Engagement in Group:  Engaged  Additional Comments:   Mahati Vajda A  

## 2013-11-02 NOTE — Progress Notes (Signed)
Adult Psychoeducational Group Note  Date:  11/02/2013 Time:  1:57 PM  Group Topic/Focus:  Therapeutic Activity  Participation Level:  Active  Participation Quality:  Appropriate  Affect:  Appropriate  Cognitive:  Appropriate  Insight: Appropriate  Engagement in Group:  Engaged  Modes of Intervention:  Activity  Additional Comments:  Pt appeared to enjoy the group activity. Pt actively participated in entire group session.   Tora Perches N 11/02/2013, 1:57 PM

## 2013-11-02 NOTE — Progress Notes (Signed)
[facility] MD Progress Note  11/02/2013 9:25 AM Angel [name]  MRN:  [id number] Subjective:  Sleep was poor due to nightmares and he kept waking up--sleep medication changed.  Appetite "not that good", depression is high with suicidal ideations, anxiety less than yesterday but pacing the halls at times.  He is worried most about getting a job, wants to find a new job that does not require him to be outside. Diagnosis:   DSM5:  Depressive Disorders:  Major Depressive Disorder - Severe (296.23)  Axis I: Anxiety Disorder NOS and Major Depression, Recurrent severe Axis II: Deferred Axis III: History reviewed. No pertinent past medical history. Axis IV: other psychosocial or environmental problems, problems related to social environment and problems with primary support group Axis V: 41-50 serious symptoms  ADL's:  Intact  Sleep: Poor  Appetite:  Poor  Suicidal Ideation:  Plan:  cut himself Intent:  yes Means:  no Homicidal Ideation:  None  Psychiatric Specialty Exam: Review of Systems  Constitutional: Negative.   HENT: Negative.   Eyes: Negative.   Respiratory: Negative.   Cardiovascular: Negative.   Gastrointestinal: Negative.   Genitourinary: Negative.   Musculoskeletal: Negative.   Skin: Negative.   Neurological: Negative.   Endo/Heme/Allergies: Negative.   Psychiatric/Behavioral: Positive for depression and suicidal ideas. The patient is nervous/anxious.     Blood pressure 100/71, pulse 86, temperature 97.8 F (36.6 C), resp. rate 20.There is no weight on file to calculate BMI.  General Appearance: Casual  Eye Contact::  Poor  Speech:  Normal Rate  Volume:  Decreased  Mood:  Anxious and Depressed  Affect:  Congruent  Thought Process:  Coherent  Orientation:  Full (Time, Place, and Person)  Thought Content:  WDL  Suicidal Thoughts:  Yes.  with intent/plan  Homicidal Thoughts:  No  Memory:  Immediate;   Fair Recent;   Fair Remote;   Fair  Judgement:  Poor   Insight:  Fair  Psychomotor Activity:  Decreased  Concentration:  Fair  Recall:  Fair  Akathisia:  No  Handed:  Right  AIMS (if indicated):     Assets:  Leisure Time Physical Health Resilience Social Support  Sleep:  Number of Hours: 6.5   Current Medications: Current Facility-Administered Medications  Medication Dose Route Frequency Provider Last Rate Last Dose  . acetaminophen (TYLENOL) tablet 650 mg  650 mg Oral Q6H PRN Nanine Means, NP   650 mg at 11/01/13 1030  . alum & mag hydroxide-simeth (MAALOX/MYLANTA) 200-200-20 MG/5ML suspension 30 mL  30 mL Oral Q4H PRN Nanine Means, NP      . butalbital-acetaminophen-caffeine (FIORICET, ESGIC) 50-325-40 MG per tablet 2 tablet  2 tablet Oral Q6H PRN Nanine Means, NP      . hydrOXYzine (ATARAX/VISTARIL) tablet 25 mg  25 mg Oral Q6H PRN Nanine Means, NP      . magnesium hydroxide (MILK OF MAGNESIA) suspension 30 mL  30 mL Oral Daily PRN Nanine Means, NP      . neomycin-bacitracin-polymyxin (NEOSPORIN) ointment   Topical BID Nanine Means, NP   1 application at 11/02/13 [phone]  . nicotine (NICODERM CQ - dosed in mg/24 hours) patch 21 mg  21 mg Transdermal Q0600 Nehemiah Settle, MD   21 mg at 11/02/13 0630  . pneumococcal 23 valent vaccine (PNU-IMMUNE) injection 0.5 mL  0.5 mL Intramuscular Tomorrow-1000 Nehemiah Settle, MD      . sertraline (ZOLOFT) tablet 25 mg  25 mg Oral Daily Nanine Means, NP   25  mg at 11/02/13 4540  . traZODone (DESYREL) tablet 100 mg  100 mg Oral QHS PRN Nanine Means, NP        Lab Results: No results found for this or any previous visit (from the past 48 hour(s)).  Physical Findings: AIMS: Facial and Oral Movements Muscles of Facial Expression: None, normal Lips and Perioral Area: None, normal Jaw: None, normal Tongue: None, normal,Extremity Movements Upper (arms, wrists, hands, fingers): None, normal Lower (legs, knees, ankles, toes): None, normal, Trunk Movements Neck, shoulders, hips:  None, normal, Overall Severity Severity of abnormal movements (highest score from questions above): None, normal Incapacitation due to abnormal movements: None, normal Patient's awareness of abnormal movements (rate only patient's report): No Awareness, Dental Status Current problems with teeth and/or dentures?: No Does patient usually wear dentures?: No  CIWA:    COWS:     Treatment Plan Summary: Daily contact with patient to assess and evaluate symptoms and progress in treatment Medication management  Plan:  Review of chart, vital signs, medications, and notes. 1-Individual and group therapy 2-Medication management for depression and anxiety:  Medications reviewed with the patient and his Trazodone discontinued and Remeron started 3-Coping skills for depression, anxiety 4-Continue crisis stabilization and management 5-Address health issues--monitoring vital signs, stable 6-Treatment plan in progress to prevent relapse of depression and anxiety  Medical Decision Making Problem Points:  Established problem, stable/improving (1) and Review of psycho-social stressors (1) Data Points:  Review of new medications or change in dosage (2)  I certify that inpatient services furnished can reasonably be expected to improve the patient's condition.   Nanine Means, PMH-NP 11/02/2013, 9:25 AM I agreed with findings and treatment plan of this patient

## 2013-11-02 NOTE — [facility] Group Notes (Signed)
.  [facility] Group Notes: (Clinical Social Work)   11/02/2013   3:00-4:00PM  Summary of Progress/Problems: The main focus of today's process group was to identify the patient's current support system and explore what other supports can be put in place. There was also an extensive discussion about what constitutes a healthy support versus an unhealthy support. With some group members' miscomprehension about the role of counseling in recovery, a great deal of the time was spent on discussing expectations for self and others in treatment. The patient refused to share what his current supports are and did not speak during group.  However, when asked if he would be willing to go to a counselor, he stated that he woud.   Type of Therapy: Process Group with Motivational Interviewing   Participation Level: Minimal  Participation Quality: Attentive   Affect: Blunted   Cognitive: Oriented   Insight: Improving   Engagement in Therapy: Improving   Modes of Intervention: Support and Processing   Ambrose Mantle, LCSW 11/02/2013, 4:27 PM

## 2013-11-02 NOTE — Progress Notes (Addendum)
D) Pt has attended the groups and does respond when asked questions. Interacts with select peers. Rates his depression at an 8 and his hopelessness at a 7. Has thoughts of SI on and off throughout the day. Does interact with select peers. And will  converse in 1:1 conversation with this Clinical research associate. Continued to talk about his mother and how her death has truly rocked his world A) given support and provided with a 1:1. Encouragement and praise given. Therapeutic humor used. R) Pt contracts for his safety.

## 2013-11-02 NOTE — Progress Notes (Signed)
Writer observed patient standing in the hallway as he usually does and Clinical research associate spoke with him 1:1. Patient appears brighter upon approach and he reported that his family visited him on today and he was surprised. Patient continues to report passive si and verbally contracts. Writer informed patient of sleep medication change and he is fine with the change. Patient voiced no complaints and is more talkative with Clinical research associate. Encouraged patient to speak with his social worker regarding any concerns or issues he needs help with before discharge. Safety maintained on unit with 15 min checks.

## 2013-11-03 DIAGNOSIS — R45851 Suicidal ideations: Secondary | ICD-10-CM

## 2013-11-03 DIAGNOSIS — F411 Generalized anxiety disorder: Secondary | ICD-10-CM

## 2013-11-03 MED ORDER — SERTRALINE HCL 50 MG PO TABS
50.0000 mg | ORAL_TABLET | Freq: Every day | ORAL | Status: DC
  Administered 2013-11-03 – 2013-11-05 (×3): 50 mg via ORAL
  Filled 2013-11-03 (×3): qty 1
  Filled 2013-11-03: qty 14
  Filled 2013-11-03: qty 1

## 2013-11-03 MED ORDER — SERTRALINE HCL 50 MG PO TABS
50.0000 mg | ORAL_TABLET | Freq: Every day | ORAL | Status: DC
  Filled 2013-11-03: qty 1

## 2013-11-03 MED ORDER — ASPIRIN-ACETAMINOPHEN-CAFFEINE 250-250-65 MG PO TABS
2.0000 | ORAL_TABLET | Freq: Three times a day (TID) | ORAL | Status: DC | PRN
  Administered 2013-11-03 – 2013-11-05 (×3): 2 via ORAL
  Filled 2013-11-03 (×3): qty 2

## 2013-11-03 NOTE — Progress Notes (Signed)
Pt reports to be adjusting well to the unit's regimen. The busy schedule keeps his mind occupied and at ease. He reports that Zoloft makes him feel weird. Writer verified with pt that his Zoloft is now scheduled for the night time to help with this previous concern voiced with his physician. He is denying any HI/AVH. He is passive SI and contracts for safety. Pt observed interacting appropriately within the milieu.  A: Writer administered scheduled and prn medications to pt. Continued support and availability as needed was extended to this pt. Staff continue to monitor pt with q56min checks.  R: No adverse drug reactions noted. Pt receptive to treatment. Pt remains safe at this time.

## 2013-11-03 NOTE — Progress Notes (Signed)
Adult Psychoeducational Group Note  Date:  11/03/2013 Time:  9:29 PM  Group Topic/Focus:  Wrap-Up Group:   The focus of this group is to help patients review their daily goal of treatment and discuss progress on daily workbooks.  Participation Level:  Active  Participation Quality:  Appropriate  Affect:  Appropriate  Cognitive:  Appropriate  Insight: Appropriate  Engagement in Group:  Engaged  Modes of Intervention:  Support  Additional Comments:  Pt stated that one thing he learned today was how to deal with things in a different way. He says an example would be if he is feeling depressed to start thinking positive thoughts. He stated that the skill usually helps and that when it doesn't he tries to just stay busy.   Tameeka Luo 11/03/2013, 9:29 PM

## 2013-11-03 NOTE — Progress Notes (Signed)
Patient ID: Angel [name], male   DOB: 05/16/1981, 32 y.o.   MRN: [id number] [facility] MD Progress Note  11/03/2013 2:30 PM Angel [name]  MRN:  [id number]  Subjective: patient reports that he is "so-so" today and feels that his zoloft is making him drowsy. He also states he didn't sleep that well. He states his appetite is up and down, his depression is about average 5/10, he still reports suicidal ideation and does not have a plan. He has had a previous attempt. He is concerned about getting a job when he leaves here. He also reports a headache which could be a side effect of the Zoloft, vs the migraine that he normally has. He states he has taken Fioricet in the past. This is discontinued and he will be encouraged to use Excedrine Migraine.  Diagnosis:   DSM5:  Depressive Disorders:  Major Depressive Disorder - Severe (296.23)  Axis I: Anxiety Disorder NOS and Major Depression, Recurrent severe Axis II: Deferred Axis III: History reviewed. No pertinent past medical history. Axis IV: other psychosocial or environmental problems, problems related to social environment and problems with primary support group Axis V: 41-50 serious symptoms  ADL's:  Intact  Sleep: Poor  Appetite:  Poor  Suicidal Ideation:  Plan:  cut himself Intent:  yes Means:  no Homicidal Ideation:  None  Psychiatric Specialty Exam: Review of Systems  Constitutional: Negative.   HENT: Negative.   Eyes: Negative.   Respiratory: Negative.   Cardiovascular: Negative.   Gastrointestinal: Negative.   Genitourinary: Negative.   Musculoskeletal: Negative.   Skin: Negative.   Neurological: Negative.   Endo/Heme/Allergies: Negative.   Psychiatric/Behavioral: Positive for depression and suicidal ideas. The patient is nervous/anxious.     Blood pressure 112/77, pulse 98, temperature 97.3 F (36.3 C), temperature source Oral, resp. rate 18.There is no weight on file to calculate BMI.  General Appearance: Casual  Eye  Contact::  Poor  Speech:  Normal Rate  Volume:  Decreased  Mood:  Anxious and Depressed  Affect:  Congruent  Thought Process:  Coherent  Orientation:  Full (Time, Place, and Person)  Thought Content:  WDL  Suicidal Thoughts:  Yes.  with intent/plan  Homicidal Thoughts:  No  Memory:  Immediate;   Fair Recent;   Fair Remote;   Fair  Judgement:  Poor  Insight:  Fair  Psychomotor Activity:  Decreased  Concentration:  Fair  Recall:  Fair  Akathisia:  No  Handed:  Right  AIMS (if indicated):     Assets:  Leisure Time Physical Health Resilience Social Support  Sleep:  Number of Hours: 6.75   Current Medications: Current Facility-Administered Medications  Medication Dose Route Frequency Provider Last Rate Last Dose  . acetaminophen (TYLENOL) tablet 650 mg  650 mg Oral Q6H PRN Nanine Means, NP   650 mg at 11/03/13 0820  . alum & mag hydroxide-simeth (MAALOX/MYLANTA) 200-200-20 MG/5ML suspension 30 mL  30 mL Oral Q4H PRN Nanine Means, NP      . aspirin-acetaminophen-caffeine (EXCEDRIN MIGRAINE) per tablet 2 tablet  2 tablet Oral Q8H PRN Verne Spurr, PA-C      . hydrOXYzine (ATARAX/VISTARIL) tablet 25 mg  25 mg Oral Q6H PRN Nanine Means, NP      . magnesium hydroxide (MILK OF MAGNESIA) suspension 30 mL  30 mL Oral Daily PRN Nanine Means, NP      . mirtazapine (REMERON) tablet 15 mg  15 mg Oral QHS Nanine Means, NP   15 mg at  11/02/13 2203  . neomycin-bacitracin-polymyxin (NEOSPORIN) ointment   Topical BID Nanine Means, NP   1 application at 11/03/13 0814  . nicotine (NICODERM CQ - dosed in mg/24 hours) patch 21 mg  21 mg Transdermal Q0600 Nehemiah Settle, MD   21 mg at 11/03/13 0616  . [START ON 11/04/2013] sertraline (ZOLOFT) tablet 50 mg  50 mg Oral Daily Verne Spurr, PA-C        Lab Results: No results found for this or any previous visit (from the past 48 hour(s)).  Physical Findings: AIMS: Facial and Oral Movements Muscles of Facial Expression: None, normal Lips  and Perioral Area: None, normal Jaw: None, normal Tongue: None, normal,Extremity Movements Upper (arms, wrists, hands, fingers): None, normal Lower (legs, knees, ankles, toes): None, normal, Trunk Movements Neck, shoulders, hips: None, normal, Overall Severity Severity of abnormal movements (highest score from questions above): None, normal Incapacitation due to abnormal movements: None, normal Patient's awareness of abnormal movements (rate only patient's report): No Awareness, Dental Status Current problems with teeth and/or dentures?: No Does patient usually wear dentures?: No  CIWA:  CIWA-Ar Total: 1 COWS:  COWS Total Score: 2  Treatment Plan Summary: Daily contact with patient to assess and evaluate symptoms and progress in treatment Medication management  Plan:  Review of chart, vital signs, medications, and notes. 1. D/C Fioricet. 2. Increased the Zoloft to 50mg  po qhs . 3. Will use Excedrine migraine for headaches. 4. Will monitor for side effects. Medical Decision Making Problem Points:  Established problem, stable/improving (1) and Review of psycho-social stressors (1) Data Points:  Review of new medications or change in dosage (2)  I certify that inpatient services furnished can reasonably be expected to improve the patient's condition.  Rona Ravens. Mashburn RPAC 2:36 PM 11/03/2013  Reviewed the information documented and agree with the treatment plan.  Macklyn Glandon,JANARDHAHA R. 11/03/2013 3:17 PM

## 2013-11-03 NOTE — Progress Notes (Addendum)
Patient ID: Angel [name], male   DOB: [birth year], 32 y.o.   MRN: [id number] D:  Patient's self inventory sheet, patient sleeps fair, improving appetite, low energy level, poor attention span.  Rated depression and anxiety 5, hopeless #3.  Denied withdrawals.  SI off/on, contracts for safety.  Denied physical problems.  Does have discharge plans.  No problems taking meds after discharge. A:  Medications administered per MD Orders.  Emotional support and encouragement given patient. R:  Denied HI.  SI of/on, contracts for safety.  Denied A/V hallucinations.  Will continue to monitor patient for safety with 15 minute checks.  Safety maintained. 1830  Patient stated his sock rubbed sore on upper right foot.  Covered with telfa non adhesive pad and tape.  Patient stated he would like to return to school, find a job he would enjoy for the remainder of his life.  Encouraged patient to talk to SW/staff and look into opportunities for him.  Patient has been cooperative and pleasant.

## 2013-11-03 NOTE — Tx Team (Signed)
Interdisciplinary Treatment Plan Update (Adult)  Date: 11/03/2013  Time Reviewed:  9:45 AM  Progress in Treatment: Attending groups: Yes Participating in groups:  Yes Taking medication as prescribed:  Yes Tolerating medication:  Yes Family/Significant othe contact made: CSW assessing  Patient understands diagnosis:  Yes Discussing patient identified problems/goals with staff:  Yes Medical problems stabilized or resolved:  Yes Denies suicidal/homicidal ideation: Yes Issues/concerns per patient self-inventory:  Yes Other:  New problem(s) identified: N/A  Discharge Plan or Barriers: CSW assessing for appropriate referrals.  Reason for Continuation of Hospitalization: Anxiety Depression Medication Stabilization  Comments: N/A  Estimated length of stay: 3-5 days  For review of initial/current patient goals, please see plan of care.  Attendees: Patient:     Family:     Physician:  Dr. Johnalagadda 11/03/2013 10:43 AM   Nursing:   Carol Davis, RN 11/03/2013 10:43 AM   Clinical Social Worker:  Avanti Jetter Horton, LCSW 11/03/2013 10:43 AM   Other: Neil Mashburn, PA 11/03/2013 10:43 AM   Other:  Roxanne Brand, care coordination 11/03/2013 10:43 AM   Other:  Amanda Lawson, RN 11/03/2013 10:43 AM   Other:  Beverly Knight, RN 11/03/2013 10:43 AM   Other:    Other:    Other:    Other:    Other:    Other:     Scribe for Treatment Team:   Horton, Keeshawn Fakhouri Nicole, 11/03/2013 10:43 AM   

## 2013-11-03 NOTE — [facility] Group Notes (Signed)
[facility] LCSW Aftercare Discharge Planning Group Note   11/03/2013 8:45 AM  Participation Quality:  Alert and Appropriate   Mood/Affect: Flat and Depressed  Depression Rating:  5  Anxiety Rating:  2  Thoughts of Suicide:  Pt endorses SI, denies HI  Will you contract for safety?   Yes  Current AVH:  Pt denies  Plan for Discharge/Comments:  Pt attended discharge planning group and actively participated in group.  CSW provided pt with today's workbook.  Pt states that he is up and down today.  Pt states that his discharge plan is to try to get a job and return home in [place].  Pt denies having follow up at this time.  CSW will assess for appropriate referrals.  No further needs voiced by pt at this time.    Transportation Means: Pt reports access to transportation - brother will pick pt up  Supports: No supports mentioned at this time  Reyes Ivan, LCSW 11/03/2013 10:08 AM

## 2013-11-03 NOTE — Progress Notes (Signed)
Recreation Therapy Notes  Date: 12.08.2014 Time: 3:00pm Location: [address] Day[address]  Group Topic: Wellness  Goal Area(s) Addresses:  Patient will define components of whole wellness. Patient will verbalize benefit to self of whole wellness.  Behavioral Response:  Appropriate   Intervention: Mind Map  Activity: Patients created a group flow chart relating to wellness and what makes up wellness. Patients were then asked to identify what they do to invest in each part of wellness.    Education: Wellness, Discharge Planning  Education Outcome: Acknowledges Education   Clinical Observations/Feedback: Patient actively engaged in group session, identifying dimensions of wellness, as well as giving examples of ways to invest in dimensions of wellness.   Marykay Lex Khizar Fiorella, LRT/CTRS  Sanela Evola L 11/03/2013 4:16 PM

## 2013-11-03 NOTE — [facility] Group Notes (Signed)
[facility] LCSW Group Therapy  11/03/2013  1:15 PM   Type of Therapy:  Group Therapy  Participation Level:  Active  Participation Quality:  Attentive, Sharing and Supportive  Affect:  Depressed and Flat  Cognitive:  Alert and Oriented  Insight:  Developing/Improving and Engaged  Engagement in Therapy:  Developing/Improving and Engaged  Modes of Intervention:  Clarification, Confrontation, Discussion, Education, Exploration, Limit-setting, Orientation, Problem-solving, Rapport Building, Dance movement psychotherapist, Socialization and Support  Summary of Progress/Problems: Pt identified obstacles faced currently and processed barriers involved in overcoming these obstacles. Pt identified steps necessary for overcoming these obstacles and explored motivation (internal and external) for facing these difficulties head on. Pt further identified one area of concern in their lives and chose a goal to focus on for today.   Pt shared that his biggest obstacle is finding a job.  Pt didn't want to elaborate further but appeared to actively listen to group discussion.    Angel Ivan, LCSW 11/03/2013 3:09 PM

## 2013-11-03 NOTE — Progress Notes (Signed)
Adult Psychoeducational Group Note  Date:  11/03/2013 Time:  11:00 AM  Group Topic/Focus:  Dimensions of Wellness:   The focus of this group is to introduce the topic of wellness and discuss the role each dimension of wellness plays in total health.  Participation Level:  Minimal  Participation Quality:  Appropriate  Affect:  Blunted  Cognitive:  Alert  Insight: Appropriate and Limited  Engagement in Group:  Limited  Modes of Intervention:  Education  Additional Comments:  Patient attended group but did not contribute to activity.  Merleen Milliner 11/03/2013, 3:20 PM

## 2013-11-04 MED ORDER — ONDANSETRON HCL 4 MG PO TABS
4.0000 mg | ORAL_TABLET | Freq: Three times a day (TID) | ORAL | Status: DC | PRN
  Administered 2013-11-04: 4 mg via ORAL
  Filled 2013-11-04: qty 1

## 2013-11-04 NOTE — Progress Notes (Signed)
Patient ID: Angel [name], male   DOB: May 19, 1981, 32 y.o.   MRN: [id number] D:  Patient's self inventory sheet, patient has poor sleep, good appetite, normal energy level, improving attention span.  Rated depression 7, hopeless 4, anxiety 5.  Denied withdrawals.  SI off/on, contracts for safety.  Had headache in past 24 hours.  Worst pain #7.  Plans to go home after discharge from [facility].  Needs financial assistance with medications after discharge. A:  Medications administered per MD orders.  Emotional support and encouragement given patient. R:  Denied HI.  SI off/on, contracts for safety.  Denied A/V hallucinations.  Will continue to monitor patient for safety with 15 minute checks.  Safety maintained.

## 2013-11-04 NOTE — Progress Notes (Signed)
The focus of this group is to educate the patient on the purpose and policies of crisis stabilization and provide a format to answer questions about their admission.  The group details unit policies and expectations of patients while admitted. Patient attended group and forward little information

## 2013-11-04 NOTE — Progress Notes (Signed)
Adult Psychoeducational Group Note  Date:  11/04/2013 Time:  6:29 PM  Group Topic/Focus:  Recovery Goals:   The focus of this group is to identify appropriate goals for recovery and establish a plan to achieve them.  Participation Level:  Active  Participation Quality:  Appropriate  Affect:  Appropriate  Cognitive:  Appropriate  Insight: Good  Engagement in Group:  Improving  Modes of Intervention:  Discussion  Additional Comments:  Pt attended recovery group this morning. Pt discussed appropriate goals for recovery and coping skills to use once discharge.   Angel [name] A 11/04/2013, 6:29 PM

## 2013-11-04 NOTE — Progress Notes (Signed)
Recreation Therapy Notes  Animal-Assisted Activity/Therapy (AAA/T) Program Checklist/Progress Notes Patient Eligibility Criteria Checklist & Daily Group note for Rec Tx Intervention  Date: 12.09.2014 Time: 2:45pm Location: [address] Day[address]    AAA/T Program Assumption of Risk Form signed by Patient/ or Parent Legal Guardian yes  Patient is free of allergies or sever asthma  yes  Patient reports no fear of animals yes  Patient reports no history of cruelty to animals yes   Patient understands his/her participation is voluntary yes.  Patient washes hands before animal contact yes  Patient washes hands after animal contact yes  Behavioral Response: Did not attend.   Agnieszka Newhouse L Elen Acero, LRT/CTRS  Burdette Gergely L 11/04/2013 4:12 PM 

## 2013-11-04 NOTE — [facility] Suicide Risk Assessment (Signed)
[facility] INPATIENT:  Family/Significant Other Suicide Prevention Education  Suicide Prevention Education:  Education Completed; Angel [name] - brother [phone]),  (name of family member/significant other) has been identified by the patient as the family member/significant other with whom the patient will be residing, and identified as the person(s) who will aid the patient in the event of a mental health crisis (suicidal ideations/suicide attempt).  With written consent from the patient, the family member/significant other has been provided the following suicide prevention education, prior to the and/or following the discharge of the patient.  The suicide prevention education provided includes the following:  Suicide risk factors  Suicide prevention and interventions  National Suicide Hotline telephone number  [facility] assessment telephone number  [facility] Emergency Assistance 911  [facility] and/or Residential Mobile Crisis Unit telephone number  Request made of family/significant other to:  Remove weapons (e.g., guns, rifles, knives), all items previously/currently identified as safety concern.    Remove drugs/medications (over-the-counter, prescriptions, illicit drugs), all items previously/currently identified as a safety concern.  The family member/significant other verbalizes understanding of the suicide prevention education information provided.  The family member/significant other agrees to remove the items of safety concern listed above.  Angel [name] 11/04/2013, 8:50 AM

## 2013-11-04 NOTE — [facility] Group Notes (Signed)
[facility] LCSW Group Therapy  11/04/2013  1:15 PM   Type of Therapy:  Group Therapy  Participation Level:  Minimal  Participation Quality: Minimal  Affect: Drowsy  Cognitive: Lacking  Insight:  Limited, Lacking  Engagement in Therapy:  Limited, Lacking  Modes of Intervention:  Clarification, Confrontation, Discussion, Education, Exploration, Limit-setting, Orientation, Problem-solving, Rapport Building, Dance movement psychotherapist, Socialization and Support  Summary of Progress/Problems: The topic for group therapy was feelings about diagnosis.  Pt slept for the duration of group discussion.   Angel Ivan, LCSW 11/04/2013 2:54 PM

## 2013-11-04 NOTE — Progress Notes (Signed)
D:  Pt passive SI-contracts for safety.Pt denies HI/AV. Pt is pleasant and cooperative. Pt wants to find a job [employer] when he leaves [facility].  A: Pt was offered support and encouragement. Pt was given scheduled medications. Pt was encourage to attend groups. Q 15 minute checks were done for safety.   R:Pt attends groups and interacts well with peers and staff. Pt is taking medication. Pt has no complaints at this time.Pt receptive to treatment and safety maintained on unit.

## 2013-11-04 NOTE — Progress Notes (Signed)
[name] MD Progress Note  11/04/2013 10:55 AM Angel [name]  MRN:  [id number]  Subjective:  Patient complaining of nausea--Zofran PRN ordered, sleep was restless and feels tired this morning, 5-6/10 depression with passive suicidal ideations, appetite is improving.  Working on depression coping skills.  Diagnosis:   DSM5:  Depressive Disorders:  Major Depressive Disorder - Severe (296.23)  Axis I: Major Depression, Recurrent severe Axis II: Deferred Axis III: History reviewed. No pertinent past medical history. Axis IV: other psychosocial or environmental problems, problems related to social environment and problems with primary support group Axis V: 41-50 serious symptoms  ADL's:  Intact  Sleep: Poor  Appetite:  Fair  Suicidal Ideation:  Plan:  none Intent:  none Means:  none Homicidal Ideation:  Denies   Psychiatric Specialty Exam: Review of Systems  Constitutional: Negative.   HENT: Negative.   Eyes: Negative.   Respiratory: Negative.   Cardiovascular: Negative.   Gastrointestinal: Positive for nausea.  Genitourinary: Negative.   Musculoskeletal: Negative.   Skin: Negative.   Neurological: Negative.   Endo/Heme/Allergies: Negative.   Psychiatric/Behavioral: Positive for depression and suicidal ideas. The patient is nervous/anxious.     Blood pressure 112/77, pulse 98, temperature 97.3 F (36.3 C), temperature source Oral, resp. rate 18.There is no weight on file to calculate BMI.  General Appearance: Casual  Eye Contact::  Fair  Speech:  Slow  Volume:  Normal  Mood:  Depressed  Affect:  Congruent  Thought Process:  Coherent  Orientation:  Full (Time, Place, and Person)  Thought Content:  WDL  Suicidal Thoughts:  Yes.  without intent/plan  Homicidal Thoughts:  No  Memory:  Immediate;   Fair Recent;   Fair Remote;   Fair  Judgement:  Fair  Insight:  Fair  Psychomotor Activity:  Decreased  Concentration:  Fair  Recall:  Fair  Akathisia:  No  Handed:   Right  AIMS (if indicated):     Assets:  Leisure Time Physical Health Resilience  Sleep:  Number of Hours: 6   Current Medications: Current Facility-Administered Medications  Medication Dose Route Frequency Provider Last Rate Last Dose  . acetaminophen (TYLENOL) tablet 650 mg  650 mg Oral Q6H PRN Nanine Means, NP   650 mg at 11/03/13 0820  . alum & mag hydroxide-simeth (MAALOX/MYLANTA) 200-200-20 MG/5ML suspension 30 mL  30 mL Oral Q4H PRN Nanine Means, NP      . aspirin-acetaminophen-caffeine (EXCEDRIN MIGRAINE) per tablet 2 tablet  2 tablet Oral Q8H PRN Verne Spurr, PA-C   2 tablet at 11/04/13 0736  . hydrOXYzine (ATARAX/VISTARIL) tablet 25 mg  25 mg Oral Q6H PRN Nanine Means, NP   25 mg at 11/03/13 2115  . magnesium hydroxide (MILK OF MAGNESIA) suspension 30 mL  30 mL Oral Daily PRN Nanine Means, NP      . mirtazapine (REMERON) tablet 15 mg  15 mg Oral QHS Nanine Means, NP   15 mg at 11/03/13 2113  . neomycin-bacitracin-polymyxin (NEOSPORIN) ointment   Topical BID Nanine Means, NP   1 application at 11/04/13 0737  . nicotine (NICODERM CQ - dosed in mg/24 hours) patch 21 mg  21 mg Transdermal Q0600 Nehemiah Settle, MD   21 mg at 11/03/13 0616  . ondansetron (ZOFRAN) tablet 4 mg  4 mg Oral Q8H PRN Nanine Means, NP      . sertraline (ZOLOFT) tablet 50 mg  50 mg Oral QHS Verne Spurr, PA-C   50 mg at 11/03/13 2113    Lab  Results: No results found for this or any previous visit (from the past 48 hour(s)).  Physical Findings: AIMS: Facial and Oral Movements Muscles of Facial Expression: None, normal Lips and Perioral Area: None, normal Jaw: None, normal Tongue: None, normal,Extremity Movements Upper (arms, wrists, hands, fingers): None, normal Lower (legs, knees, ankles, toes): None, normal, Trunk Movements Neck, shoulders, hips: None, normal, Overall Severity Severity of abnormal movements (highest score from questions above): None, normal Incapacitation due to abnormal  movements: None, normal Patient's awareness of abnormal movements (rate only patient's report): No Awareness, Dental Status Current problems with teeth and/or dentures?: No Does patient usually wear dentures?: No  CIWA:  CIWA-Ar Total: 1 COWS:  COWS Total Score: 2  Treatment Plan Summary: Daily contact with patient to assess and evaluate symptoms and progress in treatment Medication management  Plan:  Review of chart, vital signs, medications, and notes. 1-Individual and group therapy 2-Medication management for depression and anxiety:  Medications reviewed with the patient and Zofran ordered for nausea  3-Coping skills for depression, anxiety  4-Continue crisis stabilization and management 5-Address health issues--monitoring vital signs, stable 6-Treatment plan in progress to prevent relapse of depression and anxiety  Medical Decision Making Problem Points:  Established problem, stable/improving (1) and Review of psycho-social stressors (1) Data Points:  Review of medication regiment & side effects (2)  I certify that inpatient services furnished can reasonably be expected to improve the patient's condition.   Nanine Means, PMHNP 11/04/2013, 10:55 AM  Reviewed the information documented and agree with the treatment plan.  Chazlyn Cude,JANARDHAHA R. 11/04/2013 3:46 PM

## 2013-11-05 DIAGNOSIS — F332 Major depressive disorder, recurrent severe without psychotic features: Principal | ICD-10-CM

## 2013-11-05 MED ORDER — MIRTAZAPINE 15 MG PO TABS
7.5000 mg | ORAL_TABLET | Freq: Every day | ORAL | Status: DC
  Administered 2013-11-05: 7.5 mg via ORAL
  Filled 2013-11-05: qty 0.5
  Filled 2013-11-05: qty 7
  Filled 2013-11-05: qty 0.5

## 2013-11-05 NOTE — Progress Notes (Signed)
[facility] MD Progress Note  11/05/2013 2:25 PM Angel [name]  MRN:  [id number]  Subjective:  Patient has been depressed, anxious and no current suicidal thoughts today and last one is yesterday. He complaints of feeling some what sleepy during day time and asked to adjust his medications. He has been feeling sucidal for about 2 mos, since a break up and losing his job, but has been feeling depressed for about one year. Patient has history of being hospitalized at [facility] at age 67 for a previous suicide attempt due to depression. Patient denied nausea, sleep is better and appetite is good. He has normal energy and actively participating in groups. He rates 4/10 depression and anxiety 1/10 and contracts for safety.   Diagnosis:   DSM5:  Depressive Disorders:  Major Depressive Disorder - Severe (296.23)  Axis I: Major Depression, Recurrent severe Axis II: Deferred Axis III: History reviewed. No pertinent past medical history. Axis IV: other psychosocial or environmental problems, problems related to social environment and problems with primary support group Axis V: 41-50 serious symptoms  ADL's:  Intact  Sleep: Poor  Appetite:  Fair  Suicidal Ideation:  Plan:  none Intent:  none Means:  none Homicidal Ideation:  Denies   Psychiatric Specialty Exam: Review of Systems  Constitutional: Negative.   HENT: Negative.   Eyes: Negative.   Respiratory: Negative.   Cardiovascular: Negative.   Gastrointestinal: Positive for nausea.  Genitourinary: Negative.   Musculoskeletal: Negative.   Skin: Negative.   Neurological: Negative.   Endo/Heme/Allergies: Negative.   Psychiatric/Behavioral: Positive for depression and suicidal ideas. The patient is nervous/anxious.     Blood pressure 106/76, pulse 100, temperature 98.5 F (36.9 C), temperature source Oral, resp. rate 16.There is no weight on file to calculate BMI.  General Appearance: Casual  Eye Contact::  Fair  Speech:  Slow   Volume:  Normal  Mood:  Depressed  Affect:  Congruent  Thought Process:  Coherent  Orientation:  Full (Time, Place, and Person)  Thought Content:  WDL  Suicidal Thoughts:  Yes.  without intent/plan  Homicidal Thoughts:  No  Memory:  Immediate;   Fair Recent;   Fair Remote;   Fair  Judgement:  Fair  Insight:  Fair  Psychomotor Activity:  Decreased  Concentration:  Fair  Recall:  Fair  Akathisia:  No  Handed:  Right  AIMS (if indicated):     Assets:  Leisure Time Physical Health Resilience  Sleep:  Number of Hours: 5.5   Current Medications: Current Facility-Administered Medications  Medication Dose Route Frequency Provider Last Rate Last Dose  . acetaminophen (TYLENOL) tablet 650 mg  650 mg Oral Q6H PRN Nanine Means, NP   650 mg at 11/03/13 0820  . alum & mag hydroxide-simeth (MAALOX/MYLANTA) 200-200-20 MG/5ML suspension 30 mL  30 mL Oral Q4H PRN Nanine Means, NP      . aspirin-acetaminophen-caffeine (EXCEDRIN MIGRAINE) per tablet 2 tablet  2 tablet Oral Q8H PRN Verne Spurr, PA-C   2 tablet at 11/05/13 1045  . hydrOXYzine (ATARAX/VISTARIL) tablet 25 mg  25 mg Oral Q6H PRN Nanine Means, NP   25 mg at 11/05/13 1304  . magnesium hydroxide (MILK OF MAGNESIA) suspension 30 mL  30 mL Oral Daily PRN Nanine Means, NP      . mirtazapine (REMERON) tablet 7.5 mg  7.5 mg Oral QHS Nehemiah Settle, MD      . neomycin-bacitracin-polymyxin (NEOSPORIN) ointment   Topical BID Nanine Means, NP   1 application  at 11/05/13 0840  . nicotine (NICODERM CQ - dosed in mg/24 hours) patch 21 mg  21 mg Transdermal Q0600 Nehemiah Settle, MD   21 mg at 11/05/13 5784  . ondansetron (ZOFRAN) tablet 4 mg  4 mg Oral Q8H PRN Nanine Means, NP   4 mg at 11/04/13 1130  . sertraline (ZOLOFT) tablet 50 mg  50 mg Oral QHS Verne Spurr, PA-C   50 mg at 11/04/13 2200    Lab Results: No results found for this or any previous visit (from the past 48 hour(s)).  Physical Findings: AIMS: Facial and  Oral Movements Muscles of Facial Expression: None, normal Lips and Perioral Area: None, normal Jaw: None, normal Tongue: None, normal,Extremity Movements Upper (arms, wrists, hands, fingers): None, normal Lower (legs, knees, ankles, toes): None, normal, Trunk Movements Neck, shoulders, hips: None, normal, Overall Severity Severity of abnormal movements (highest score from questions above): None, normal Incapacitation due to abnormal movements: None, normal Patient's awareness of abnormal movements (rate only patient's report): No Awareness, Dental Status Current problems with teeth and/or dentures?: No Does patient usually wear dentures?: No  CIWA:  CIWA-Ar Total: 1 COWS:  COWS Total Score: 2  Treatment Plan Summary: Daily contact with patient to assess and evaluate symptoms and progress in treatment Medication management  Plan:  Review of chart, vital signs, medications, and notes. 1-Individual and group therapy 2-Medication management for depression and anxiety:   Medications reviewed with the patient and Zofran ordered for nausea  Decrease Remeron 7.5 mg PO Qhs 3-Coping skills for depression, anxiety  4-Continue crisis stabilization and management 5-Address health issues--monitoring vital signs, stable 6-Treatment plan in progress to prevent relapse of depression and anxiety 8. Disposition plans are in progress and his brother might come and pick up when ready to be discharge.  Medical Decision Making Problem Points:  Established problem, stable/improving (1) and Review of psycho-social stressors (1) Data Points:  Review of medication regiment & side effects (2)  I certify that inpatient services furnished can reasonably be expected to improve the patient's condition.    Bentlie Withem,JANARDHAHA R., MD12/08/2013 2:25 PM

## 2013-11-05 NOTE — Progress Notes (Signed)
Adult Psychoeducational Group Note  Date:  11/04/2013 Time:  8:00 pm  Group Topic/Focus:  Wrap-Up Group:   The focus of this group is to help patients review their daily goal of treatment and discuss progress on daily workbooks.  Participation Level:  Active  Participation Quality:  Appropriate and Sharing  Affect:  Appropriate  Cognitive:  Appropriate  Insight: Appropriate  Engagement in Group:  Engaged  Modes of Intervention:  Discussion, Education, Socialization and Support  Additional Comments:  Pt stated that he is in the hospital for an attempted suicide. Pt. Stated that enjoys reading and drawing.   Angel [name] 11/05/2013, 12:12 AM

## 2013-11-05 NOTE — Progress Notes (Signed)
[facility] Group Notes:  (Nursing/MHT/Case Management/Adjunct)  Date:  11/05/2013  Time:  9:49 PM  Type of Therapy:  Group Therapy  Participation Level:  Active  Participation Quality:  Appropriate  Affect:  Appropriate  Cognitive:  Appropriate  Insight:  Appropriate  Engagement in Group:  Engaged  Modes of Intervention:  Discussion  Summary of Progress/Problems:The patient expressed that the day wad long.The patient said that he felt ,staff needs more activities to do.  Octavio Manns 11/05/2013, 9:49 PM

## 2013-11-05 NOTE — Progress Notes (Signed)
Patient ID: Angel [name], male   DOB: 09-Feb-1981, 32 y.o.   MRN: [id number]  D: Patient presents with flat affect and depressed mood. Patient denies HI and A/V hallucinations. Patient is passive for SI but contracts for safety. Patient complains of anxiety, feeling like, "the walls are closing in." Patient also complains of a headache and requested medication for both complaints. Patient   A: Writer gave encouragement and support to patient to do deep breathing exercises. PRN Excedrin and Vistaril was given to patient for stated complaints.   R: Patient is cooperative and pleasant with Clinical research associate. Patient is seen periodically in the milieu. Patient now denies pain and presents with decreased anxiety. Q15 minute safety checks are maintained.

## 2013-11-05 NOTE — [facility] Group Notes (Signed)
[facility] LCSW Aftercare Discharge Planning Group Note   11/05/2013 3:04 PM    Participation Quality:  Appropraite  Mood/Affect:  Appropriate  Depression Rating:  5  Anxiety Rating:  2  Thoughts of Suicide:  No  Will you contract for safety?   NA  Current AVH:  No  Plan for Discharge/Comments:  Patient attended discharge planning group and actively participated in group.  He reports doing well and hopes to discharge home soon.  He will follow up with [facility] in [place].  CSW provided all participants with daily workbook.   Transportation Means: Patient has transportation.   Supports:  Patient has a support system.   Angel [name], Joesph July

## 2013-11-05 NOTE — Tx Team (Signed)
Interdisciplinary Treatment Plan Update   Date Reviewed:  11/05/2013  Time Reviewed:  9:33 AM  Progress in Treatment:   Attending groups: Yes Participating in groups: Yes Taking medication as prescribed: Yes  Tolerating medication: Yes Family/Significant other contact made: Yes, contact made with family Patient understands diagnosis: Yes  Discussing patient identified problems/goals with staff: Yes Medical problems stabilized or resolved: Yes Denies suicidal/homicidal ideation: Yes Patient has not harmed self or others: Yes  For review of initial/current patient goals, please see plan of care.  Estimated Length of Stay:  1 day - Discharge Thursday  Reasons for Continued Hospitalization:  Anxiety Depression Medication stabilization   New Problems/Goals identified:    Discharge Plan or Barriers:   Home with outpatient follow up Angel [name]  Additional Comments:  Attendees:  Patient:  11/05/2013 9:33 AM   Signature: Mervyn Gay, MD 11/05/2013 9:33 AM  Signature:   11/05/2013 9:33 AM  Signature:  11/05/2013 9:33 AM  Signature: Robbie Louis, RN 11/05/2013 9:33 AM  Signature:  Aloha Gell, RN 11/05/2013 9:33 AM  Signature:  Juline Patch, LCSW 11/05/2013 9:33 AM  Signature:  Reyes Ivan, LCSW 11/05/2013 9:33 AM  Signature: Conard Novak Coordinator 11/05/2013 9:33 AM  Signature:  Aloha Gell, RN 11/05/2013 9:33 AM  Signature: 11/05/2013  9:33 AM  Signature:   Onnie Boer, RN [facility] 11/05/2013  9:33 AM  Signature:  Elizbeth Squires, Team Lead [place] 11/05/2013  9:33 AM    Scribe for Treatment Team:   Juline Patch,  11/05/2013 9:33 AM

## 2013-11-05 NOTE — Progress Notes (Signed)
Recreation Therapy Notes  Date: 12.10.2014 Time: 3:00pm Location: [address] Dayroom  Group Topic: Leisure Education  Goal Area(s) Addresses:  Patient will identify positive leisure activities.  Patient will identify one positive benefit of participation in leisure activities.   Behavioral Response:  Appropriate  Intervention: Game  Activity: Group Leisure ABC's. Patients were split into teams of 4, as a team they were asked to identify leisure activities to correspond with each letter of the alphabet. Patient lists were combined to make large group list.  Education:  Leisure Education, Pharmacologist, Building control surveyor.   Education Outcome: Acknowledges understanding  Clinical Observations/Feedback: Patient actively engaged in group activity working well with her teammates to identify leisure activities to correspond with letters of the alphabet. Patient made no contributions to group discussion, but appeared to actively listen as he maintained appropriate eye contact with speaker.    Marykay Lex Bentleigh Waren, LRT/CTRS  Jearl Klinefelter 11/05/2013 3:58 PM

## 2013-11-06 DIAGNOSIS — F411 Generalized anxiety disorder: Secondary | ICD-10-CM

## 2013-11-06 MED ORDER — MIRTAZAPINE 7.5 MG PO TABS: 7.5000 mg | ORAL_TABLET | Freq: Every day | ORAL | Status: DC

## 2013-11-06 MED ORDER — HYDROXYZINE HCL 25 MG PO TABS: ORAL_TABLET | ORAL | Status: DC

## 2013-11-06 MED ORDER — SERTRALINE HCL 50 MG PO TABS: 50.0000 mg | ORAL_TABLET | Freq: Every day | ORAL | Status: DC

## 2013-11-06 MED ORDER — BACITRACIN-NEOMYCIN-POLYMYXIN 400-5-5000 EX OINT: TOPICAL_OINTMENT | Freq: Two times a day (BID) | CUTANEOUS | Status: DC

## 2013-11-06 NOTE — Progress Notes (Signed)
Patient ID: BURGESS SHERIFF, male   DOB: December 28, 1980, 32 y.o.   MRN: [id number]  Morning Wellness Group 9:00 AM  The focus of this group is to educate the patient on the purpose and policies of crisis stabilization and provide a format to answer questions about their admission.  The group details unit policies and expectations of patients while admitted.  Patient attended group and listened intently to Clinical research associate and other RN leading group. Patient participated in stretching exercises but did not participate in guided imagery. Patient was seen laughing at the end and smiling.

## 2013-11-06 NOTE — [facility] Suicide Risk Assessment (Signed)
Suicide Risk Assessment  Discharge Assessment     Demographic Factors:  Male, Adolescent or young adult, Low socioeconomic status and Unemployed  Mental Status Per Nursing Assessment::   On Admission:  Suicidal ideation indicated by patient;Suicide plan;Plan includes specific time, place, or method;Self-harm thoughts;Intention to act on suicide plan  Current Mental Status by Physician: Mental Status Examination: Patient appeared as per his stated age, casually dressed, and fairly groomed, and maintaining good eye contact. Patient has good mood and his affect was constricted. He has normal rate, rhythm, and volume of speech. His thought process is linear and goal directed. Patient has denied suicidal, homicidal ideations, intentions or plans. Patient has no evidence of auditory or visual hallucinations, delusions, and paranoia. Patient has fair insight judgment and impulse control.  Loss Factors: Financial problems/change in socioeconomic status  Historical Factors: Prior suicide attempts, Family history of mental illness or substance abuse and Impulsivity  Risk Reduction Factors:   Sense of responsibility to family, Religious beliefs about death, Living with another person, especially a relative, Positive social support, Positive therapeutic relationship and Positive coping skills or problem solving skills  Continued Clinical Symptoms:  Severe Anxiety and/or Agitation Depression:   Recent sense of peace/wellbeing Unstable or Poor Therapeutic Relationship Previous Psychiatric Diagnoses and Treatments Medical Diagnoses and Treatments/Surgeries  Cognitive Features That Contribute To Risk:  Polarized thinking    Suicide Risk:  Minimal: No identifiable suicidal ideation.  Patients presenting with no risk factors but with morbid ruminations; may be classified as minimal risk based on the severity of the depressive symptoms  Discharge Diagnoses:   AXIS I:  Generalized Anxiety Disorder  and Major Depression, Recurrent severe AXIS II:  Deferred AXIS III:  History reviewed. No pertinent past medical history. AXIS IV:  economic problems, occupational problems, other psychosocial or environmental problems, problems related to social environment and problems with primary support group AXIS V:  61-70 mild symptoms  Plan Of Care/Follow-up recommendations:  Activity:  As tolerated Diet:  Regular  Is patient on multiple antipsychotic therapies at discharge:  No   Has Patient had three or more failed trials of antipsychotic monotherapy by history:  No  Recommended Plan for Multiple Antipsychotic Therapies: NA  Marice Angelino,JANARDHAHA R. 11/06/2013, 11:08 AM

## 2013-11-06 NOTE — Discharge Summary (Signed)
Physician Discharge Summary Note  Patient:  Angel [name] is an 32 y.o., male MRN:  [id number] DOB:  1981-09-01 Patient phone:  [phone] (home)  Patient address:   [address] [place] [address],   Date of Admission:  10/31/2013 Date of Discharge: 11/06/13  Reason for Admission: Mood stabilization  Discharge Diagnoses: Principal Problem:   Major depressive disorder, recurrent episode, severe, without mention of psychotic behavior Active Problems:   Generalized anxiety disorder  Review of Systems  Constitutional: Negative.   HENT: Negative.   Eyes: Negative.   Respiratory: Negative.   Cardiovascular: Negative.   Gastrointestinal: Negative.   Genitourinary: Negative.   Musculoskeletal: Negative.   Skin: Negative.   Neurological: Negative.   Endo/Heme/Allergies: Negative.   Psychiatric/Behavioral: Positive for depression (Stabilized with medication prior to discharge) and hallucinations. Negative for suicidal ideas, memory loss and substance abuse. The patient is nervous/anxious (Stabilized with medication prior to discharge) and has insomnia (Stabilized with medication prior to discharge).    DSM5: Schizophrenia Disorders:  NA Obsessive-Compulsive Disorders:  NA Trauma-Stressor Disorders:  NA Substance/Addictive Disorders:  NA Depressive Disorders:  Major depressive disorder, recurrent episode, severe, without mention of psychotic behavior, Generalized anxiety disorder   Axis Diagnosis:   AXIS I:  Generalized Anxiety Disorder and Major depressive disorder, recurrent episode, severe, without mention of psychotic behavior AXIS II:  Deferred AXIS III:  History reviewed. No pertinent past medical history. AXIS IV:  other psychosocial or environmental problems and Chronic mental health issues AXIS V:  63  Level of Care:  OP  Hospital Course:  32 y.o. male that was assessed via tele assessment this day per EDP Rancour. This clinician obtained clinical  information from EDP Rancour @ [phone]. Pt presents with suicide attempt by cutting his right arm superficially with broken glass. Pt is still suicidal and unable to contract for safety. Pt has a hx of a previous suicide attempt. Pt stated he has been feeling sucidal for about 2 mos, since a break up and losing his job, but has been feeling depressed for about one year by report. Pt has had no other mental health treatment other than being hospitalized at [facility] at age 49 for a previous suicide attempt due to depression. Pt denies HI or psychosis. Pt denies SA. Pt is tearful and presents with depressed affect during assessment. Pt has little support and is living with friends. Pt stated he wants help. Pt stated his only medical problem is migraines and pt cannot remember the medication he takes for this. Pt self-referred to [facility].  While a patient in this hospital and after admission assessment/evaluation, it was determined based on patient's symptoms that her toxicology/UDS reports indicated no presence of alcohol and or any drugs in his systems. However, he presented feelings of increased depression, suicidal ideations and high anxiety symptoms. He did make a superficial lacerations to his right arm areas. It was then determined that he will need medication management to re-stabilize her current depressive mood symptoms as she has a history of major depressive disorder and Generalized Anxiety disorder. He was then ordered and received Sertraline  50 mg daily for depression, Remeron 7.5 mg Q bedtime for anxiety/depression and insomnia, Hydroxyzine 25 mg tid daily as needed for anxiety. He also was enrolled in group counseling sessions and activities where he was counseled and learned coping skills that should help him cope better and manage his symptoms effectively after discharge. He also received medication management and monitoring for her other previously existing  medical issues and concerns. He tolerated  his treatment regimen without any significant adverse effects and or reactions presented.   Patient did respond positively to his treatment regimen. This is evidenced by his daily reports of improved mood, reduction of symptoms and presentation of good affect/eye contact. He attended treatment team meeting this am and met with his treatment team members. Her reason for admission, present symptoms, response to treatment and discharge plans discussed with patient. Quaron endorsed that his symptoms has stabilized and that she is ready for discharge to pursue psychiatric care on outpatient basis. It was then agreed upon that patient will follow-up care at the [facility] in [place], [place] on 11/07/13 at 07;45 am.   He has been instructed and informed that the appointment at [facility] is a walk-in appointment. The address, date, time and contact information for this clinic provided for patient in writing.  Upon discharge, Mr. Kincheloe adamantly denies any suicidal, homicidal ideations, auditory, visual hallucinations, paranoia and or delusional thoughts. He was provided with 14 days worth supply samples of his [facility] discharge medications. He left [facility] with all personal belongings in no apparent distress. Transportation per family.  Consults:  psychiatry  Significant Diagnostic Studies:  labs: CBC with diff, CMP, UDS, toxicology tests, U/A  Discharge Vitals:   Blood pressure 96/62, pulse 108, temperature 97.8 F (36.6 C), temperature source Oral, resp. rate 16. There is no weight on file to calculate BMI. Lab Results:   No results found for this or any previous visit (from the past 72 hour(s)).  Physical Findings: AIMS: Facial and Oral Movements Muscles of Facial Expression: None, normal Lips and Perioral Area: None, normal Jaw: None, normal Tongue: None, normal,Extremity Movements Upper (arms, wrists, hands, fingers): None, normal Lower (legs, knees, ankles, toes): None, normal, Trunk  Movements Neck, shoulders, hips: None, normal, Overall Severity Severity of abnormal movements (highest score from questions above): None, normal Incapacitation due to abnormal movements: None, normal Patient's awareness of abnormal movements (rate only patient's report): No Awareness, Dental Status Current problems with teeth and/or dentures?: No Does patient usually wear dentures?: No  CIWA:  CIWA-Ar Total: 1 COWS:  COWS Total Score: 2  Psychiatric Specialty Exam: See Psychiatric Specialty Exam and Suicide Risk Assessment completed by Attending Physician prior to discharge.  Discharge destination:  Home  Is patient on multiple antipsychotic therapies at discharge:  No   Has Patient had three or more failed trials of antipsychotic monotherapy by history:  No  Recommended Plan for Multiple Antipsychotic Therapies: NA     Medication List    STOP taking these medications       FIORICET 50-325-40 MG per tablet  Generic drug:  butalbital-acetaminophen-caffeine     ibuprofen 200 MG tablet  Commonly known as:  ADVIL,MOTRIN      TAKE these medications     Indication   hydrOXYzine 25 MG tablet  Commonly known as:  ATARAX/VISTARIL  Take 1 tablet (25 mg ) three times daily for anxiety/sleep   Indication:  Anxiety associated with Organic Disease, Tension     mirtazapine 7.5 MG tablet  Commonly known as:  REMERON  Take 1 tablet (7.5 mg total) by mouth at bedtime. For depression/sleep   Indication:  Trouble Sleeping     neomycin-bacitracin-polymyxin ointment  Commonly known as:  NEOSPORIN  Apply topically 2 (two) times daily. apply to right forearm/temple: For infection   Indication:  Prevention of Bacterial Infection     sertraline 50 MG tablet  Commonly known  as:  ZOLOFT  Take 1 tablet (50 mg total) by mouth at bedtime. For depression   Indication:  Major Depressive Disorder       Follow-up Information   Follow up with Ar[facility] On 11/07/2013. (Please arrive at  7:45 on Friday, November 07, 2013)    Contact information:   Physical Address: [address] [place], [place] [address] Phone: [phone] Fax: [phone]     Follow-up recommendations: Activity:  As tolerated Diet: As recommended by your primary care doctor. Keep all scheduled follow-up appointments as recommended.   Comments: Take all your medications as prescribed by your mental healthcare provider. Report any adverse effects and or reactions from your medicines to your outpatient provider promptly. Patient is instructed and cautioned to not engage in alcohol and or illegal drug use while on prescription medicines. In the event of worsening symptoms, patient is instructed to call the crisis hotline, 911 and or go to the nearest ED for appropriate evaluation and treatment of symptoms. Follow-up with your primary care provider for your other medical issues, concerns and or health care needs.    Total Discharge Time:  Greater than 30 minutes.  Signed: Sanjuana Kava, PMHNP, FNP-BC 11/06/2013, 10:32 AM  The patient was seen for psychiatric evaluation, suicide risk assessment and formulated treatment plan, after discussing the case with the treatment team made disposition and treatment plan. Reviewed the information documented and agree with the treatment plan.Darrol Jump R. 11/06/2013 1:35 PM

## 2013-11-06 NOTE — Progress Notes (Signed)
Patient ID: Angel [name], male   DOB: October 15, 1981, 32 y.o.   MRN: [id number]  Pt. Denies SI/HI and A/V hallucinations. Belongings (1 black belt) returned to patient at time of discharge. Patient denies any pain or discomfort. Patient verbalized that he was ready to get home. He states he feels better and coming to [facility] helped him. Discharge instructions and medications were reviewed with patient. Patient verbalized understanding of both medications and discharge instructions. Patient filled out patient satisfaction survey before discharge. No questions or concerns were presented to Clinical research associate by patient.

## 2013-11-06 NOTE — Progress Notes (Deleted)
D: Pt is eager to discharge and to start rehab. He was pleasant and had no concerns he wished for this writer to address at this time. Pt is in good standing with his wife. Wife is supportive of pt. Pt attended group this evening. Pt observed interacting appropriately within the milieu. Pt is denying any SIHI/AVH.  P: Continued support and availability as needed was extended to this pt. Staff continue to monitor pt with q15min checks.  R: Pt receptive to treatment. Pt remains safe at this time.   

## 2013-11-06 NOTE — Progress Notes (Signed)
[facility] Adult Case Management Discharge Plan :  Will you be returning to the same living situation after discharge: Yes,  Patient is returning to his home. At discharge, do you have transportation home?:Yes,  Brother to transport patient home. Do you have the ability to pay for your medications:No. Patient needs assistance with indigent medications   Release of information consent forms completed and in the chart;  Patient's signature needed at discharge.  Patient to Follow up at: Follow-up Information   Follow up with Arna Medici On 11/07/2013. (Please arrive at 7:45 on Friday, November 07, 2013)    Contact information:   Physical Address: [address] [place], [place] [address] Phone: [phone] Fax: [phone]      Patient denies SI/HI:  Patient no longer endorsing SI/HI or other thoughts of self harm.  Safety Planning and Suicide Prevention discussed: .Reviewed with all patients during discharge planning group  Jantz Main, Joesph July 11/06/2013, 10:43 AM

## 2013-11-06 NOTE — Progress Notes (Signed)
D: Pt is anxious in affect and mood. Pt's anxiety controlled with vistaril. Pt is passively SI. Pt is denying any HI/AVH. Pt attended group this evening. Pt observed interacting appropriately within the milieu. At times pt paces the hall as a coping mechanism for his anxiety. He is starting to fill enclosed here.   A: Writer administered scheduled and prn medications to pt. Continued support and availability as needed was extended to this pt. Staff continue to monitor pt with q5min checks.  R: No adverse drug reactions noted. Pt receptive to treatment. Pt remains safe at this time.

## 2013-11-11 NOTE — Progress Notes (Signed)
Patient Discharge Instructions:  After Visit Summary (AVS):   Faxed to:  11/11/13 Discharge Summary Note:   Faxed to:  11/11/13 Psychiatric Admission Assessment Note:   Faxed to:  11/11/13 Suicide Risk Assessment - Discharge Assessment:   Faxed to:  11/11/13 Faxed/Sent to the Next Level Care provider:  11/11/13 Faxed to [facility] @ [phone]  Jerelene Redden, 11/11/2013, 2:15 PM

## 2014-12-27 ENCOUNTER — Encounter (HOSPITAL_COMMUNITY): Payer: Self-pay | Admitting: Emergency Medicine

## 2014-12-27 ENCOUNTER — Emergency Department (HOSPITAL_COMMUNITY)
Admission: EM | Admit: 2014-12-27 | Discharge: 2014-12-28 | Disposition: A | Discharge status: Other Acute Inpatient Hospital | Payer: Self-pay | Attending: Emergency Medicine | Admitting: Emergency Medicine

## 2014-12-27 DIAGNOSIS — Y9289 Other specified places as the place of occurrence of the external cause: Secondary | ICD-10-CM | POA: Insufficient documentation

## 2014-12-27 DIAGNOSIS — Z720 Tobacco use: Secondary | ICD-10-CM | POA: Insufficient documentation

## 2014-12-27 DIAGNOSIS — Z79899 Other long term (current) drug therapy: Secondary | ICD-10-CM | POA: Insufficient documentation

## 2014-12-27 DIAGNOSIS — Z7289 Other problems related to lifestyle: Secondary | ICD-10-CM

## 2014-12-27 DIAGNOSIS — Y998 Other external cause status: Secondary | ICD-10-CM | POA: Insufficient documentation

## 2014-12-27 DIAGNOSIS — R45851 Suicidal ideations: Secondary | ICD-10-CM | POA: Insufficient documentation

## 2014-12-27 DIAGNOSIS — Y9389 Activity, other specified: Secondary | ICD-10-CM | POA: Insufficient documentation

## 2014-12-27 DIAGNOSIS — S51811A Laceration without foreign body of right forearm, initial encounter: Secondary | ICD-10-CM | POA: Insufficient documentation

## 2014-12-27 DIAGNOSIS — X788XXA Intentional self-harm by other sharp object, initial encounter: Secondary | ICD-10-CM | POA: Insufficient documentation

## 2014-12-27 DIAGNOSIS — S51812A Laceration without foreign body of left forearm, initial encounter: Secondary | ICD-10-CM | POA: Insufficient documentation

## 2014-12-27 HISTORY — DX: Depression, unspecified: F32.A

## 2014-12-27 HISTORY — DX: Major depressive disorder, single episode, unspecified: F32.9

## 2014-12-27 HISTORY — DX: Anxiety disorder, unspecified: F41.9

## 2014-12-27 LAB — CBC WITH DIFFERENTIAL/PLATELET
BASOS ABS: 0.1 10*3/uL (ref 0.0–0.1)
Basophils Relative: 1 % (ref 0–1)
EOS ABS: 0.3 10*3/uL (ref 0.0–0.7)
Eosinophils Relative: 3 % (ref 0–5)
HCT: 44.5 % (ref 39.0–52.0)
HEMOGLOBIN: 15.5 g/dL (ref 13.0–17.0)
LYMPHS PCT: 37 % (ref 12–46)
Lymphs Abs: 3.6 10*3/uL (ref 0.7–4.0)
MCH: 30.2 pg (ref 26.0–34.0)
MCHC: 34.8 g/dL (ref 30.0–36.0)
MCV: 86.6 fL (ref 78.0–100.0)
MONO ABS: 0.8 10*3/uL (ref 0.1–1.0)
Monocytes Relative: 8 % (ref 3–12)
Neutro Abs: 4.9 10*3/uL (ref 1.7–7.7)
Neutrophils Relative %: 51 % (ref 43–77)
PLATELETS: 205 10*3/uL (ref 150–400)
RBC: 5.14 MIL/uL (ref 4.22–5.81)
RDW: 13 % (ref 11.5–15.5)
WBC: 9.5 10*3/uL (ref 4.0–10.5)

## 2014-12-27 LAB — RAPID URINE DRUG SCREEN, HOSP PERFORMED
Amphetamines: NOT DETECTED
BARBITURATES: NOT DETECTED
Benzodiazepines: NOT DETECTED
Cocaine: NOT DETECTED
OPIATES: NOT DETECTED
Tetrahydrocannabinol: POSITIVE — AB

## 2014-12-27 LAB — COMPREHENSIVE METABOLIC PANEL
ALBUMIN: 4.5 g/dL (ref 3.5–5.2)
ALK PHOS: 67 U/L (ref 39–117)
ALT: 18 U/L (ref 0–53)
ANION GAP: 5 (ref 5–15)
AST: 19 U/L (ref 0–37)
BUN: 11 mg/dL (ref 6–23)
CO2: 26 mmol/L (ref 19–32)
Calcium: 9 mg/dL (ref 8.4–10.5)
Chloride: 106 mmol/L (ref 96–112)
Creatinine, Ser: 0.87 mg/dL (ref 0.50–1.35)
Glucose, Bld: 87 mg/dL (ref 70–99)
POTASSIUM: 3.3 mmol/L — AB (ref 3.5–5.1)
SODIUM: 137 mmol/L (ref 135–145)
Total Bilirubin: 0.8 mg/dL (ref 0.3–1.2)
Total Protein: 7.6 g/dL (ref 6.0–8.3)

## 2014-12-27 LAB — ETHANOL: Alcohol, Ethyl (B): 5 mg/dL (ref 0–9)

## 2014-12-27 MED ORDER — MIRTAZAPINE 7.5 MG PO TABS
7.5000 mg | ORAL_TABLET | Freq: Every day | ORAL | Status: DC
  Filled 2014-12-27 (×2): qty 1

## 2014-12-27 MED ORDER — MIRTAZAPINE 15 MG PO TBDP
15.0000 mg | ORAL_TABLET | Freq: Every day | ORAL | Status: DC
  Administered 2014-12-27: 15 mg via ORAL
  Filled 2014-12-27: qty 1

## 2014-12-27 MED ORDER — POTASSIUM CHLORIDE CRYS ER 20 MEQ PO TBCR
40.0000 meq | EXTENDED_RELEASE_TABLET | Freq: Once | ORAL | Status: AC
Start: 2014-12-27 — End: 2014-12-27
  Administered 2014-12-27: 40 meq via ORAL
  Filled 2014-12-27: qty 2

## 2014-12-27 MED ORDER — NICOTINE 21 MG/24HR TD PT24
21.0000 mg | MEDICATED_PATCH | Freq: Every day | TRANSDERMAL | Status: DC
  Administered 2014-12-27 – 2014-12-28 (×2): 21 mg via TRANSDERMAL
  Filled 2014-12-27 (×2): qty 1

## 2014-12-27 MED ORDER — ONDANSETRON HCL 4 MG PO TABS: 4.0000 mg | ORAL_TABLET | Freq: Three times a day (TID) | ORAL | Status: DC | PRN

## 2014-12-27 MED ORDER — SERTRALINE HCL 50 MG PO TABS
50.0000 mg | ORAL_TABLET | Freq: Every day | ORAL | Status: DC
  Administered 2014-12-27: 50 mg via ORAL
  Filled 2014-12-27: qty 1

## 2014-12-27 MED ORDER — ACETAMINOPHEN 325 MG PO TABS: 650.0000 mg | ORAL_TABLET | ORAL | Status: DC | PRN

## 2014-12-27 MED ORDER — MIRTAZAPINE 15 MG PO TABS
15.0000 mg | ORAL_TABLET | Freq: Every day | ORAL | Status: DC
  Filled 2014-12-27 (×2): qty 1

## 2014-12-27 MED ORDER — HYDROXYZINE HCL 25 MG PO TABS: 25.0000 mg | ORAL_TABLET | Freq: Three times a day (TID) | ORAL | Status: DC | PRN

## 2014-12-27 MED ORDER — MIRTAZAPINE 15 MG PO TBDP
ORAL_TABLET | ORAL | Status: AC
  Filled 2014-12-27: qty 1

## 2014-12-27 NOTE — ED Notes (Signed)
Patient brought in via EMS. Alert and oriented. Airway patent. Ambulatory. Patient c/o being "stressed out" and depressed. Per EMS patient wondering the streets. Patient has bilateral superficial cuts to forearms that were dressed by EMS paramedic. Gauze and kerlix dressing clean and intact. Patient reports suicidal ideation but denies any homicidal ideation. When asked if something triggered his SI he states "just life."

## 2014-12-27 NOTE — BHH Counselor (Signed)
Per Janann Augustori Burkett, NP, pt meets criteria for inpt treatment. [facility] T[facility] to seek placement.  TTS Counselor informed pt's attending physician at [facility], Dr. Deretha EmoryZackowski, of disposition.   Cyndie MullAnna Shelina Luo, [facility] Triage Specialist

## 2014-12-27 NOTE — ED Provider Notes (Addendum)
CSN: [id number]     Arrival date & time 12/27/14  1840 History   First MD Initiated Contact with Patient 12/27/14 1850     Chief Complaint  Patient presents with  . V70.1     (Consider location/radiation/quality/duration/timing/severity/associated sxs/prior Treatment) The history is provided by the patient and the EMS personnel.   34 year old male brought in by EMS suicidal thoughts and not cutting to both arms multiple lacerations. Patient states that the he does not want to live. Patient is had prior admissions for suicidal thoughts and depression problems.    Past Medical History  Diagnosis Date  . Depression   . Anxiety    History reviewed. No pertinent past surgical history. History reviewed. No pertinent family history. History  Substance Use Topics  . Smoking status: Current Every Day Smoker -- 1.00 packs/day for 20 years    Types: Cigarettes  . Smokeless tobacco: Never Used  . Alcohol Use: No    Review of Systems  Constitutional: Negative for fever.  HENT: Negative for congestion.   Eyes: Negative for redness.  Respiratory: Negative for shortness of breath.   Cardiovascular: Negative for chest pain.  Gastrointestinal: Negative for abdominal pain.  Genitourinary: Negative for dysuria.  Musculoskeletal: Negative for back pain and neck pain.  Skin: Positive for wound.  Neurological: Negative for headaches.  Hematological: Does not bruise/bleed easily.  Psychiatric/Behavioral: Positive for suicidal ideas and self-injury. Negative for confusion.      Allergies  Review of patient's allergies indicates no known allergies.  Home Medications   Prior to Admission medications   Medication Sig Start Date End Date Taking? Authorizing Provider  ibuprofen (ADVIL,MOTRIN) 200 MG tablet Take 400-600 mg by mouth every 6 (six) hours as needed for headache.   Yes Historical Provider, MD  TRAZODONE HCL PO Take by mouth at bedtime.   Yes Historical Provider, MD  hydrOXYzine  (ATARAX/VISTARIL) 25 MG tablet Take 1 tablet (25 mg ) three times daily for anxiety/sleep 11/06/13   Sanjuana Kava, NP  mirtazapine (REMERON) 7.5 MG tablet Take 1 tablet (7.5 mg total) by mouth at bedtime. For depression/sleep 11/06/13   Sanjuana Kava, NP  neomycin-bacitracin-polymyxin (NEOSPORIN) ointment Apply topically 2 (two) times daily. apply to right forearm/temple: For infection Patient not taking: Reported on 12/27/2014 11/06/13   Sanjuana Kava, NP  sertraline (ZOLOFT) 50 MG tablet Take 1 tablet (50 mg total) by mouth at bedtime. For depression 11/06/13   Sanjuana Kava, NP   BP 118/77 mmHg  Pulse 81  Temp(Src) 98.8 F (37.1 C) (Oral)  Resp 20  Ht  (1.727 m)  Wt 171 lb (77.565 kg)  BMI 26.01 kg/m2  SpO2 100% Physical Exam  Constitutional: He is oriented to person, place, and time. He appears well-developed and well-nourished. No distress.  HENT:  Head: Normocephalic and atraumatic.  Mouth/Throat: Oropharynx is clear and moist.  Eyes: Conjunctivae and EOM are normal. Pupils are equal, round, and reactive to light.  Neck: Normal range of motion.  Cardiovascular: Normal rate, regular rhythm and normal heart sounds.   No murmur heard. Pulmonary/Chest: Effort normal and breath sounds normal. No respiratory distress.  Abdominal: Soft. Bowel sounds are normal. There is no tenderness.  Musculoskeletal: Normal range of motion.  Patient with multiple superficial lacerations to the anterior surface of both forearms. Radial pulses are 2+ bilaterally. Good range of motion of the fingers bilaterally. No sensory deficit.  Neurological: He is alert and oriented to person, place, and time. No cranial  nerve deficit. He exhibits normal muscle tone. Coordination normal.  Skin: Skin is warm. No rash noted.  Nursing note and vitals reviewed.   ED Course  Procedures (including critical care time) Labs Review Labs Reviewed  COMPREHENSIVE METABOLIC PANEL - Abnormal; Notable for the following:     Potassium 3.3 (*)    All other components within normal limits  URINE RAPID DRUG SCREEN (HOSP PERFORMED) - Abnormal; Notable for the following:    Tetrahydrocannabinol POSITIVE (*)    All other components within normal limits  CBC WITH DIFFERENTIAL/PLATELET  ETHANOL   Results for orders placed or performed during the hospital encounter of 12/27/14  CBC WITH DIFFERENTIAL  Result Value Ref Range   WBC 9.5 4.0 - 10.5 K/uL   RBC 5.14 4.22 - 5.81 MIL/uL   Hemoglobin 15.5 13.0 - 17.0 g/dL   HCT 16.144.5 09.639.0 - 04.552.0 %   MCV 86.6 78.0 - 100.0 fL   MCH 30.2 26.0 - 34.0 pg   MCHC 34.8 30.0 - 36.0 g/dL   RDW 40.913.0 81.111.5 - 91.415.5 %   Platelets 205 150 - 400 K/uL   Neutrophils Relative % 51 43 - 77 %   Neutro Abs 4.9 1.7 - 7.7 K/uL   Lymphocytes Relative 37 12 - 46 %   Lymphs Abs 3.6 0.7 - 4.0 K/uL   Monocytes Relative 8 3 - 12 %   Monocytes Absolute 0.8 0.1 - 1.0 K/uL   Eosinophils Relative 3 0 - 5 %   Eosinophils Absolute 0.3 0.0 - 0.7 K/uL   Basophils Relative 1 0 - 1 %   Basophils Absolute 0.1 0.0 - 0.1 K/uL  Comprehensive metabolic panel  Result Value Ref Range   Sodium 137 135 - 145 mmol/L   Potassium 3.3 (L) 3.5 - 5.1 mmol/L   Chloride 106 96 - 112 mmol/L   CO2 26 19 - 32 mmol/L   Glucose, Bld 87 70 - 99 mg/dL   BUN 11 6 - 23 mg/dL   Creatinine, Ser 7.820.87 0.50 - 1.35 mg/dL   Calcium 9.0 8.4 - 95.610.5 mg/dL   Total Protein 7.6 6.0 - 8.3 g/dL   Albumin 4.5 3.5 - 5.2 g/dL   AST 19 0 - 37 U/L   ALT 18 0 - 53 U/L   Alkaline Phosphatase 67 39 - 117 U/L   Total Bilirubin 0.8 0.3 - 1.2 mg/dL   GFR calc non Af Amer >90 >90 mL/min   GFR calc Af Amer >90 >90 mL/min   Anion gap 5 5 - 15  Drug screen panel, emergency  Result Value Ref Range   Opiates NONE DETECTED NONE DETECTED   Cocaine NONE DETECTED NONE DETECTED   Benzodiazepines NONE DETECTED NONE DETECTED   Amphetamines NONE DETECTED NONE DETECTED   Tetrahydrocannabinol POSITIVE (A) NONE DETECTED   Barbiturates NONE DETECTED NONE  DETECTED  Ethanol  Result Value Ref Range   Alcohol, Ethyl (B) 5 0 - 9 mg/dL     Imaging Review No results found.   EKG Interpretation None      MDM   Final diagnoses:  Suicidal ideation  Deliberate self-cutting    Patient brought in by EMS. Patient is alert and oriented. He was found wandering the streets. Patient reported these feeling suicidal. Denies any homicidal ideation. Multiple bilateral superficial cuts to both forearms. Redressed here in the emergency department. Patient states tetanus is up-to-date.  Patient avidly states he does not want to live. Patient's lab workup negative except for some  mild hypokalemia. Patient was given some oral potassium.  Patient most likely requires admission based on his presentation at this time. We'll consult ACT team.   Vanetta Mulders, MD 12/27/14 2041  The patient interviewed by behavioral health. They are recommending inpatient placement. They're working on a bed.  Vanetta Mulders, MD 12/27/14 2133

## 2014-12-27 NOTE — BH Assessment (Addendum)
Tele Assessment Note   Angel [name] is a 34 y.o., Caucasian, single male who presents to [facility] with suicidal ideation and bilateral lacerations to forearms. Pt presents with depressed mood and congruent affect, logical and coherent speech, and normal thought process without any indication of delusional thinking. Pt is disheveled, maintains good eye-contact, and is oriented x4. Pt was brought to ED via EMS after being found wandering the streets. Pt reports a long hx of depression and anxiety, at least since the time that he was a teenager. Pt states that he attempted to kill himself earlier this evening by cutting his arms and wrists. Pt states that he is currently under a lot of stress but would not elaborate besides stating that he was kicked out of his residence earlier today and was now homeless. Pt was somewhat guarded throughout the session and provided very little information to the counselor beyond what was specifically asked. However, he states not feeling well and experiencing a severe headache at the moment. Pt reports symptoms of depression including feelings of hopelessness, suicidal ideations, fatigue, feelings of worthlessness, crying spells, loss of interest in previously-enjoyed activities, increased irritability, decreased appetite, and increased sleep of up to 14 hrs per night. Pt has been psychiatrically hospitalized at least twice in the past, at [facility] in 2014 and at [place] in 2015. Pt has never seen a therapist or psychiatrist on an outpatient basis. Pt says he has attempted suicide numerous times, via OD, attempted hanging, cutting, etc. but that he "has never succeeded". Pt denies any current HI or any hx of HI. He reports that he has no social supports. No known family hx of mental illness, suicide, or SA. Pt denies any hx of any type of abuse. Pt denies any drug or alcohol use but tested positive for THC tonight. He denies access to weapons or firearms. Pt is open to inpt  treatment.  Per Janann August, NP, pt meets criteria for inpt treatment. No available beds at [facility]. TTS to seek placement.  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 Depression   . Anxiety    Axis IV: economic problems, housing problems, other psychosocial or environmental problems and problems with primary support group Axis V: 21-30 behavior considerably influenced by delusions or hallucinations OR serious impairment in judgment, communication OR inability to function in almost all areas  Past Medical History:  Past Medical History  Diagnosis Date  . Depression   . Anxiety     History reviewed. No pertinent past surgical history.  Family History: History reviewed. No pertinent family history.  Social History:  reports that he has been smoking Cigarettes.  He has a 20 pack-year smoking history. He has never used smokeless tobacco. He reports that he does not drink alcohol or use illicit drugs.  Additional Social History:  Alcohol / Drug Use Pain Medications: None reported Prescriptions: See PTA List Over the Counter: See PTA List History of alcohol / drug use?: Yes Longest period of sobriety (when/how long): Unknown; Pt denied any drug or alcohol use during assessment Substance #1 Name of Substance 1: THC 1 - Age of First Use: [place]; Pt did not admit use 1 - Amount (size/oz): [place]: Pt did not admit use 1 - Frequency: [place]: Pt did not admit use 1 -  Duration: [place]: Pt did not admit use 1 - Last Use / Amount: Tested positive for THC on 12/27/14  CIWA: CIWA-Ar BP: 118/77 mmHg Pulse Rate: 81 COWS:    PATIENT STRENGTHS: (choose at least two) Ability for insight Average or above average intelligence Communication skills Physical Health  Allergies: No Known Allergies  Home Medications:  (Not in a hospital  admission)  OB/GYN Status:  No LMP for male patient.  General Assessment Data Location of Assessment: [facility] Is this a Tele or Face-to-Face Assessment?: Tele Assessment Is this an Initial Assessment or a Re-assessment for this encounter?: Initial Assessment Living Arrangements: Other (Comment) (Homeless since today; Was kicked out of friends' residence) Can pt return to current living arrangement?: Yes Admission Status: Voluntary Is patient capable of signing voluntary admission?: Yes Transfer from: Other (Comment) Soil scientist(Community) Referral Source: Self/Family/Friend     [facility] Living Arrangements: Other (Comment) (Homeless since today; Was kicked out of friends' residence) Name of Psychiatrist: None Name of Therapist: None  Education Status Is patient currently in school?: No Current Grade: na Highest grade of school patient has completed: na Name of school: na Contact person: na  Risk to self with the past 6 months Suicidal Ideation: Yes-Currently Present Suicidal Intent: Yes-Currently Present Is patient at risk for suicide?: Yes Suicidal Plan?: Yes-Currently Present Specify Current Suicidal Plan: Cutting arms/wrists Access to Means: Yes Specify Access to Suicidal Means: Anything sharp What has been your use of drugs/alcohol within the last 12 months?: Pt denies any use but tested positive for THC today Previous Attempts/Gestures: Yes How many times?:  ("Multiple") Other Self Harm Risks: Has attempted suicide multiple times in multiple ways Triggers for Past Attempts: Unknown (Pt said "just stress") Intentional Self Injurious Behavior: Cutting Comment - Self Injurious Behavior: Cutting arms/wrists Family Suicide History: Unknown Recent stressful life event(s): Financial Problems, Other (Comment) (Pt says he's been under alot of stress, did not elaborate) Persecutory voices/beliefs?: No Depression: Yes Depression Symptoms: Despondent, Tearfulness, Isolating,  Fatigue, Loss of interest in usual pleasures, Feeling worthless/self pity, Feeling angry/irritable Substance abuse history and/or treatment for substance abuse?: No Suicide prevention information given to non-admitted patients: Not applicable  Risk to Others within the past 6 months Homicidal Ideation: No Thoughts of Harm to Others: No Current Homicidal Intent: No Current Homicidal Plan: No Access to Homicidal Means: No Identified Victim: na History of harm to others?: No Assessment of Violence: None Noted Violent Behavior Description: None noted besides throwing objects when angry, per pt Does patient have access to weapons?: No Criminal Charges Pending?: No Does patient have a court date: No  Psychosis Hallucinations: None noted Delusions: None noted  Mental Status Report Appear/Hygiene: Disheveled, Poor hygiene Eye Contact: Good Motor Activity: Freedom of movement Speech: Logical/coherent Level of Consciousness: Quiet/awake Mood: Depressed Affect: Depressed Anxiety Level: Minimal Thought Processes: Coherent, Relevant Judgement: Partial Orientation: Person, Place, Time, Situation Obsessive Compulsive Thoughts/Behaviors: None  Cognitive Functioning Concentration: Normal Memory: Recent Intact, Remote Intact IQ: Average Insight: Fair Impulse Control: Fair Appetite: Poor Weight Loss: 0 Weight Gain: 0 Sleep: Increased Total Hours of Sleep: 14 Vegetative Symptoms: Staying in bed, Not bathing, Decreased grooming  ADLScreening [facility]) Patient's cognitive ability adequate to safely complete daily activities?: Yes Patient able to express need for assistance with ADLs?: Yes Independently performs ADLs?: Yes (appropriate for developmental age)  Prior Inpatient Therapy Prior Inpatient Therapy: Yes Prior Therapy Dates: 2015 Prior Therapy Facilty/Provider(s): [facility] Reason for Treatment: Suicidal ideation  Prior Outpatient Therapy  Prior Outpatient  Therapy: No Prior Therapy Dates: na Prior Therapy Facilty/Provider(s): na Reason for Treatment: na  ADL Screening (condition at time of admission) Patient's cognitive ability adequate to safely complete daily activities?: Yes Is the patient deaf or have difficulty hearing?: No Does the patient have difficulty seeing, even when wearing glasses/contacts?: No Does the patient have difficulty concentrating, remembering, or making decisions?: No Patient able to express need for assistance with ADLs?: Yes Does the patient have difficulty dressing or bathing?: No Independently performs ADLs?: Yes (appropriate for developmental age) Does the patient have difficulty walking or climbing stairs?: No Weakness of Legs: None Weakness of Arms/Hands: None  Home Assistive Devices/Equipment Home Assistive Devices/Equipment: None    Abuse/Neglect Assessment (Assessment to be complete while patient is alone) Physical Abuse: Denies Verbal Abuse: Denies Sexual Abuse: Denies Exploitation of patient/patient's resources: Denies Self-Neglect: Denies Values / Beliefs Cultural Requests During Hospitalization: None Spiritual Requests During Hospitalization: None   Advance Directives (For Healthcare) Does patient have an advance directive?: No    Additional Information 1:1 In Past 12 Months?: No CIRT Risk: No Elopement Risk: No Does patient have medical clearance?: Yes     Disposition: Per Janann August, NP, pt meets criteria for inpt treatment. [facility] at [facility] TTS to seek placement. Disposition Initial Assessment Completed for this Encounter: Yes Disposition of Patient: Inpatient treatment program Type of inpatient treatment program: Adult  Cyndie Mull, [facility] Triage Specialist  12/27/2014 9:21 PM

## 2014-12-27 NOTE — BHH Counselor (Signed)
TTS Counselor spoke with nursing staff at [facility] and asked for tele-assessment cart to be placed in pt's room. Counselor also reviewed pt chart in [facility].  Assessment to begin shortly.   Cyndie MullAnna Maysel Mccolm, [facility] Triage Specialist

## 2014-12-28 ENCOUNTER — Inpatient Hospital Stay: Payer: Self-pay | Admitting: Psychiatry

## 2014-12-28 NOTE — ED Provider Notes (Signed)
Pt awake/alert, no distress He has no complaints [facility] He is appropriate for transfer    Joya Gaskinsonald W Demerius Podolak, MD 12/28/14 [phone]

## 2014-12-28 NOTE — BHH Counselor (Signed)
Angel [name], [facility] confirms adult unit is currently at capacity. TTS contacted the following facilities for placement:  NO APPROPRIATE FACILITIES CURRENTLY HAVE AVAILABLE BEDS.  AT CAPACITY: [facility], per [facility], per [facility], per [facility], per [facility], per [facility], per [employer]Crystal Alvia [facility] Mar[facility], per [facility], per [facility], per [facility], per [facility], per [facility], per [facility], per [facility], per [facility], per [employer]Sheila [facility], per [facility], per [facility], per Nancy FetterJustin [facility], per [employer] Gr[employer] [place], per [place], per [employer] Energyae [place], per CiscoChristina  NO RESPONSE: [facility] [place]

## 2014-12-28 NOTE — ED Notes (Signed)
Report given to Ivette LoyalGwynn, RN at [facility] for [facility]A.

## 2014-12-28 NOTE — Progress Notes (Addendum)
CSW informed pt's nurse, Lurena JoinerRebecca, that per Olivia Mackieressa @[place] , [facility] Dr. Jennet MaduroPucilowska to 313A bed.   Olivia Mackieressa requested to call report at [phone] when pt. has transportation.  Per Olivia Mackieressa pt. to come after 7pm.   Melbourne Abtsatia Metztli Sachdev, LCSWA Disposition staff 12/28/2014 3:55 PM

## 2014-12-28 NOTE — ED Provider Notes (Signed)
6:30 PM BP 105/76 mmHg  Pulse 77  Temp(Src) 97.7 F (36.5 C) (Oral)  Resp 20  Ht 5\' 8"  (1.727 m)  Wt 171 lb (77.565 kg)  BMI 26.01 kg/m2  SpO2 100% The patient appears reasonably stabilized for transfer considering the current resources, flow, and capabilities available in the ED at this time, and I doubt any other [facility] requiring further screening and/or treatment in the ED prior to transfer. He is not under IVC   Joya Gaskinsonald W Sameeha Rockefeller, MD 12/28/14 [phone]

## 2014-12-28 NOTE — ED Notes (Signed)
[facility] and said pt has a bed at [facility] bed 313-A and report and pt can be transported after 7pm. [facility] [name] number is [phone] accepting MD Dr. Jennet MaduroPucilowska.

## 2015-03-28 NOTE — H&P (Signed)
PATIENT NAME:  Angel [name], Angel [name] MR#:  [id number] DATE OF BIRTH:  06-02-1981  DATE OF ADMISSION:  12/28/2014  DATE OF ASSESSMENT: 12/29/2014  REFERRING PHYSICIAN:  [facility] MD   ATTENDING PHYSICIAN: Nayla Dias B. Jennet Maduro, MD   IDENTIFYING DATA: Mr. Brubacher is a 34 year old male with a history of severe depression.   CHIEF COMPLAINT: "I cut myself."   HISTORY OF PRESENT ILLNESS: Mr. Amano has a long history of depression with first hospitalization at the age of 43. He has been unemployed and homeless for the past 10 years. He applied for disability, but has not heard back from his lawyer yet. He and became increasingly depressed and suicidal when he was asked to leave his friend's house when he was staying temporarily. In order to relieve the pain and as a means to suicide, he cut both forearms multiple times with a piece of glass superficially. He did not require stitches. He endorsed many symptoms of depression with poor sleep, decreased appetite, anhedonia, feeling of guilt, hopelessness, worthlessness, poor energy and concentration, crying spells, social isolation, and now suicidal ideation. The patient denies psychotic symptoms. He does endorse excessive anxiety with  panic attacks and social anxiety. He denies symptoms suggestive of bipolar mania. He does not use alcohol or drugs.   PAST PSYCHIATRIC HISTORY: Depressed all of his life. First hospitalization for suicide attempt at [facility] at the age of 19. He has been hospitalized once a year for the past 4 years. The last hospitalization was at [facility] in December 2014. At that time he was started on Zoloft for depression and hydroxyzine for anxiety, and was followed up with [facility]. The patient reports that he initially Zoloft was helpful, but in spite of dose increase, he does not find it useful now. He has never engage in therapy at [facility]. He reports 1 suicide attempt at the age of 35; and then other suicide  gestures by cutting more recently.   FAMILY PSYCHIATRIC HISTORY: He has a paternal aunt who had multiple hospitalizations at [facility]; she also attempted suicide.   PAST MEDICAL HISTORY: None.   ALLERGIES: No known drug allergies.   MEDICATIONS ON ADMISSION: Zoloft 100 mg daily, trazodone 100 mg at bedtime.   SOCIAL HISTORY: He dropped out of school in the eighth grade to help his family financially. He has never been married, has no children. He has no family to speak of. He has been unemployed for the past 10 years, although, in reviewing [facility] chart, apparently he was admitted there  in December after he broke up with a girlfriend and lost his job. This must have been temporary. He applied for disability, but still awaits their response. He is homeless at the moment, but will not be discharged to the homeless shelter as he still has a friend who would  take him in also, the patient believes.   REVIEW OF SYSTEMS:  CONSTITUTIONAL: No fevers or chills. No weight changes.  EYES: No double or blurred vision.  ENT: No hearing loss.  RESPIRATORY: No shortness of breath or cough.  CARDIOVASCULAR: No chest pain or orthopnea.  GASTROINTESTINAL: No abdominal pain, nausea, vomiting, or diarrhea.  GENITOURINARY: No incontinence or frequency.  ENDOCRINE: No heat or cold intolerance.  LYMPHATIC: No anemia or easy bruising.  INTEGUMENTARY: No acne or rash.  MUSCULOSKELETAL: No muscle or joint pain.  NEUROLOGIC: No tingling or weakness.  PSYCHIATRIC: See history of present illness for details.   PHYSICAL EXAMINATION:  VITAL SIGNS: Blood pressure 105/70, pulse 86, respirations 20, temperature 98.2.  GENERAL: This is a well-developed male, looking much older than his stated age, in no acute distress.  HEENT: The pupils are equal, round and reactive to light. Sclerae are anicteric.  NECK: Supple. No thyromegaly.  LUNGS: Clear to auscultation. No dullness to percussion.  HEART:  Regular rhythm and rate. No murmurs, rubs or gallops.  ABDOMEN: Soft, nontender, nondistended. Positive bowel sounds.  MUSCULOSKELETAL: Normal muscle strength in all extremities.  SKIN: No rashes or bruises.  LYMPHATIC: No cervical adenopathy.  NEUROLOGIC: Cranial nerves II through XII are intact.   LABORATORY DATA: All laboratories were obtained at  [facility]. Urine tox screen negative for substances, except for cannabis. CBC within normal limits. LFTs within normal limits. Chemistries are within normal limits.   MENTAL STATUS EXAMINATION ON ADMISSION: The patient is alert and oriented to person, place, time and situation. He is pleasant, polite and cooperative. There is severe psychomotor retardation. He maintains limited eye contact. His speech is soft. There is paucity of speech. He is poorly groomed with a strong body odor. He wears hospital scrubs. His mood is depressed with flat affect. Thought process is logical and goal oriented. He endorses suicidal ideation, chronic. There are no thoughts of hurting others, and there are no delusions or paranoia. There is no auditory or visual hallucinations. His cognition is grossly intact. Registration, recall, short and long-term memory are intact. He is of average intelligence and fund of knowledge. His insight and judgment are fair.   SUICIDE RISK ASSESSMENT ON ADMISSION: This is a patient with a long history of depression and a suicide attempt, poor treatment compliance and multiple social stressors, who came to the hospital after another suicidal gesture by a cutting his wrists, bilaterally.   INITIAL DIAGNOSES:  AXIS I: Major depressive disorder, recurrent, severe. Anxiety disorder, not otherwise specified.  AXIS II: Deferred.  AXIS III: Multiple scratches on both forearms.   PLAN: The patient was admitted to [facility] unit for safety, stabilization and medication management. He was  initially placed on suicide precautions and was closely monitored for any unsafe behaviors.  1.  Suicidal ideation: The patient is able to contract for safety.  2.  Mood. He was restarted on Zoloft and Remeron at [facility]. It is unclear whether the patient had been taking any medication prior to. He has no income. [facility] is [facility] at best. We will continue Zoloft and Remeron at night. We will also add trazodone for insomnia.  3.  Superficial cuts. We will treat with Neosporin.   DISPOSITION: He will be discharged to a friend's house, and will follow up with [facility].    ____________________________ Ellin GoodieJolanta B. Jennet MaduroPucilowska, MD jbp:MT D: 12/29/2014 14:57:32 ET T: 12/29/2014 15:48:05 ET JOB#: [id number]  cc: Dynisha Due B. Jennet MaduroPucilowska, MD, <Dictator> Shari ProwsJOLANTA B Mava Suares MD ELECTRONICALLY SIGNED 01/10/2015 21:53

## 2015-07-17 DIAGNOSIS — G4489 Other headache syndrome: Secondary | ICD-10-CM | POA: Insufficient documentation

## 2015-07-17 DIAGNOSIS — F17200 Nicotine dependence, unspecified, uncomplicated: Secondary | ICD-10-CM | POA: Insufficient documentation

## 2015-07-17 DIAGNOSIS — F17210 Nicotine dependence, cigarettes, uncomplicated: Secondary | ICD-10-CM

## 2015-09-14 ENCOUNTER — Encounter: Payer: Self-pay | Admitting: Physician Assistant

## 2015-09-14 ENCOUNTER — Ambulatory Visit: Payer: Self-pay | Admitting: Physician Assistant

## 2015-09-14 VITALS — BP 120/84 | HR 83 | Temp 97.5°F | Ht 67.5 in | Wt 192.0 lb

## 2015-09-14 DIAGNOSIS — F17200 Nicotine dependence, unspecified, uncomplicated: Secondary | ICD-10-CM

## 2015-09-14 NOTE — Patient Instructions (Signed)
Smoking Cessation, Tips for Success If you are ready to quit smoking, congratulations! You have chosen to help yourself be healthier. Cigarettes bring nicotine, tar, carbon monoxide, and other irritants into your body. Your lungs, heart, and blood vessels will be able to work better without these poisons. There are many different ways to quit smoking. Nicotine gum, nicotine patches, a nicotine inhaler, or nicotine nasal spray can help with physical craving. Hypnosis, support groups, and medicines help break the habit of smoking. WHAT THINGS CAN I DO TO MAKE QUITTING EASIER?  Here are some tips to help you quit for good:  Pick a date when you will quit smoking completely. Tell all of your friends and family about your plan to quit on that date.  Do not try to slowly cut down on the number of cigarettes you are smoking. Pick a quit date and quit smoking completely starting on that day.  Throw away all cigarettes.   Clean and remove all ashtrays from your home, work, and car.  On a card, write down your reasons for quitting. Carry the card with you and read it when you get the urge to smoke.  Cleanse your body of nicotine. Drink enough water and fluids to keep your urine clear or pale yellow. Do this after quitting to flush the nicotine from your body.  Learn to predict your moods. Do not let a bad situation be your excuse to have a cigarette. Some situations in your life might tempt you into wanting a cigarette.  Never have "just one" cigarette. It leads to wanting another and another. Remind yourself of your decision to quit.  Change habits associated with smoking. If you smoked while driving or when feeling stressed, try other activities to replace smoking. Stand up when drinking your coffee. Brush your teeth after eating. Sit in a different chair when you read the paper. Avoid alcohol while trying to quit, and try to drink fewer caffeinated beverages. Alcohol and caffeine may urge you to  smoke.  Avoid foods and drinks that can trigger a desire to smoke, such as sugary or spicy foods and alcohol.  Ask people who smoke not to smoke around you.  Have something planned to do right after eating or having a cup of coffee. For example, plan to take a walk or exercise.  Try a relaxation exercise to calm you down and decrease your stress. Remember, you may be tense and nervous for the first 2 weeks after you quit, but this will pass.  Find new activities to keep your hands busy. Play with a pen, coin, or rubber band. Doodle or draw things on paper.  Brush your teeth right after eating. This will help cut down on the craving for the taste of tobacco after meals. You can also try mouthwash.   Use oral substitutes in place of cigarettes. Try using lemon drops, carrots, cinnamon sticks, or chewing gum. Keep them handy so they are available when you have the urge to smoke.  When you have the urge to smoke, try deep breathing.  Designate your home as a nonsmoking area.  If you are a heavy smoker, ask your health care provider about a prescription for nicotine chewing gum. It can ease your withdrawal from nicotine.  Reward yourself. Set aside the cigarette money you save and buy yourself something nice.  Look for support from others. Join a support group or smoking cessation program. Ask someone at home or at work to help you with your plan   to quit smoking.  Always ask yourself, "Do I need this cigarette or is this just a reflex?" Tell yourself, "Today, I choose not to smoke," or "I do not want to smoke." You are reminding yourself of your decision to quit.  Do not replace cigarette smoking with electronic cigarettes (commonly called e-cigarettes). The safety of e-cigarettes is unknown, and some may contain harmful chemicals.  If you relapse, do not give up! Plan ahead and think about what you will do the next time you get the urge to smoke. HOW WILL I FEEL WHEN I QUIT SMOKING? You  may have symptoms of withdrawal because your body is used to nicotine (the addictive substance in cigarettes). You may crave cigarettes, be irritable, feel very hungry, cough often, get headaches, or have difficulty concentrating. The withdrawal symptoms are only temporary. They are strongest when you first quit but will go away within 10-14 days. When withdrawal symptoms occur, stay in control. Think about your reasons for quitting. Remind yourself that these are signs that your body is healing and getting used to being without cigarettes. Remember that withdrawal symptoms are easier to treat than the major diseases that smoking can cause.  Even after the withdrawal is over, expect periodic urges to smoke. However, these cravings are generally short lived and will go away whether you smoke or not. Do not smoke! WHAT RESOURCES ARE AVAILABLE TO HELP ME QUIT SMOKING? Your health care provider can direct you to community resources or hospitals for support, which may include:  Group support.  Education.  Hypnosis.  Therapy.   This information is not intended to replace advice given to you by your health care provider. Make sure you discuss any questions you have with your health care provider.   Document Released: 08/11/2004 Document Revised: 12/04/2014 Document Reviewed: 05/01/2013 Elsevier Interactive Patient Education 2016 Elsevier Inc.  

## 2015-09-14 NOTE — Progress Notes (Signed)
   BP 120/84 mmHg  Pulse 83  Temp(Src) 97.5 F (36.4 C)  Ht 5' 7.5" (1.715 m)  Wt 192 lb (87.091 kg)  BMI 29.61 kg/m2  SpO2 97%   Subjective:    Patient ID: Angel [name], male    DOB: 30-Mar-1981, 34 y.o.   MRN: [id number]  HPI: Angel [name] is a 34 y.o. male presenting on 09/14/2015 for Follow-up   HPI   Chief Complaint  Patient presents with  . Follow-up   Pt is still going to [facility] for his depression/anxiety. Gets headaches sometimes.  States maybe 4/wk. States tylenol or ibu usu make it go away.  Relevant past medical, surgical, family and social history reviewed and updated as indicated. Interim medical history since our last visit reviewed. Allergies and medications reviewed and updated.   Current outpatient prescriptions:  .  citalopram (CELEXA) 20 MG tablet, Take 20 mg by mouth daily., Disp: , Rfl:  .  ibuprofen (ADVIL,MOTRIN) 200 MG tablet, Take 400-600 mg by mouth every 6 (six) hours as needed for headache., Disp: , Rfl:  .  traZODone (DESYREL) 100 MG tablet, Take 100 mg by mouth at bedtime., Disp: , Rfl:    Review of Systems  Constitutional: Negative for fever, chills, diaphoresis, appetite change, fatigue and unexpected weight change.  HENT: Positive for hearing loss. Negative for congestion, dental problem, drooling, ear pain, facial swelling, mouth sores, sneezing, sore throat, trouble swallowing and voice change.   Eyes: Negative for pain, discharge, redness, itching and visual disturbance.  Respiratory: Negative for cough, choking, shortness of breath and wheezing.   Cardiovascular: Negative for chest pain, palpitations and leg swelling.  Gastrointestinal: Negative for vomiting, abdominal pain, diarrhea, constipation and blood in stool.  Endocrine: Negative for cold intolerance, heat intolerance and polydipsia.  Genitourinary: Negative for dysuria, hematuria and decreased urine volume.  Musculoskeletal: Negative for back pain, arthralgias and gait  problem.  Skin: Negative for rash.  Allergic/Immunologic: Negative for environmental allergies.  Neurological: Positive for headaches. Negative for seizures, syncope and light-headedness.  Hematological: Negative for adenopathy.  Psychiatric/Behavioral: Positive for dysphoric mood. Negative for suicidal ideas and agitation. The patient is nervous/anxious.     Per HPI unless specifically indicated above     Objective:    BP 120/84 mmHg  Pulse 83  Temp(Src) 97.5 F (36.4 C)  Ht 5' 7.5" (1.715 m)  Wt 192 lb (87.091 kg)  BMI 29.61 kg/m2  SpO2 97%  Wt Readings from Last 3 Encounters:  09/14/15 192 lb (87.091 kg)  08/27/14 169 lb 9.6 oz (76.93 kg)  08/27/14 169 lb (76.658 kg)    Physical Exam  Constitutional: He is oriented to person, place, and time. He appears well-developed and well-nourished.  HENT:  Head: Normocephalic and atraumatic.  Neck: Neck supple.  Cardiovascular: Normal rate and regular rhythm.   Pulmonary/Chest: Effort normal and breath sounds normal. He has no wheezes.  Abdominal: Soft. Bowel sounds are normal. There is no tenderness.  Musculoskeletal: He exhibits no edema.  Lymphadenopathy:    He has no cervical adenopathy.  Neurological: He is alert and oriented to person, place, and time.  Skin: Skin is warm and dry.  Psychiatric: He has a normal mood and affect. His behavior is normal.  Vitals reviewed.     Assessment & Plan:     Encounter Diagnosis  Name Primary?  . Tobacco use disorder Yes    -Counseled on smoking cessation -F/u 1 year. rto sooner prn

## 2016-08-28 ENCOUNTER — Ambulatory Visit: Payer: Self-pay

## 2016-09-13 ENCOUNTER — Ambulatory Visit: Payer: Self-pay | Admitting: Physician Assistant

## 2016-09-21 ENCOUNTER — Encounter: Payer: Self-pay | Admitting: Physician Assistant

## 2017-05-16 ENCOUNTER — Encounter (HOSPITAL_COMMUNITY): Payer: Self-pay | Admitting: Emergency Medicine

## 2017-05-16 ENCOUNTER — Inpatient Hospital Stay (HOSPITAL_COMMUNITY)
Admission: EM | Admit: 2017-05-16 | Discharge: 2017-05-23 | DRG: 885 | Disposition: A | Discharge status: Home / Self Care | Payer: No Typology Code available for payment source | Source: Intra-hospital | Attending: Psychiatry | Admitting: Psychiatry

## 2017-05-16 ENCOUNTER — Emergency Department (HOSPITAL_COMMUNITY)
Admission: EM | Admit: 2017-05-16 | Discharge: 2017-05-16 | Disposition: A | Discharge status: Other Acute Inpatient Hospital | Payer: Self-pay | Attending: Emergency Medicine | Admitting: Emergency Medicine

## 2017-05-16 ENCOUNTER — Encounter (HOSPITAL_COMMUNITY): Payer: Self-pay

## 2017-05-16 ENCOUNTER — Emergency Department (HOSPITAL_COMMUNITY): Payer: Self-pay

## 2017-05-16 DIAGNOSIS — S51812A Laceration without foreign body of left forearm, initial encounter: Secondary | ICD-10-CM | POA: Diagnosis present

## 2017-05-16 DIAGNOSIS — F17210 Nicotine dependence, cigarettes, uncomplicated: Secondary | ICD-10-CM | POA: Diagnosis present

## 2017-05-16 DIAGNOSIS — Y929 Unspecified place or not applicable: Secondary | ICD-10-CM | POA: Insufficient documentation

## 2017-05-16 DIAGNOSIS — G4700 Insomnia, unspecified: Secondary | ICD-10-CM | POA: Diagnosis present

## 2017-05-16 DIAGNOSIS — F419 Anxiety disorder, unspecified: Secondary | ICD-10-CM | POA: Diagnosis present

## 2017-05-16 DIAGNOSIS — Y939 Activity, unspecified: Secondary | ICD-10-CM | POA: Insufficient documentation

## 2017-05-16 DIAGNOSIS — R45851 Suicidal ideations: Secondary | ICD-10-CM | POA: Insufficient documentation

## 2017-05-16 DIAGNOSIS — S60222A Contusion of left hand, initial encounter: Secondary | ICD-10-CM | POA: Insufficient documentation

## 2017-05-16 DIAGNOSIS — S51811A Laceration without foreign body of right forearm, initial encounter: Secondary | ICD-10-CM | POA: Diagnosis present

## 2017-05-16 DIAGNOSIS — F332 Major depressive disorder, recurrent severe without psychotic features: Secondary | ICD-10-CM | POA: Diagnosis present

## 2017-05-16 DIAGNOSIS — F339 Major depressive disorder, recurrent, unspecified: Principal | ICD-10-CM | POA: Diagnosis present

## 2017-05-16 DIAGNOSIS — Z818 Family history of other mental and behavioral disorders: Secondary | ICD-10-CM | POA: Diagnosis not present

## 2017-05-16 DIAGNOSIS — Z915 Personal history of self-harm: Secondary | ICD-10-CM | POA: Diagnosis not present

## 2017-05-16 DIAGNOSIS — Z79899 Other long term (current) drug therapy: Secondary | ICD-10-CM | POA: Insufficient documentation

## 2017-05-16 DIAGNOSIS — W269XXA Contact with unspecified sharp object(s), initial encounter: Secondary | ICD-10-CM | POA: Insufficient documentation

## 2017-05-16 DIAGNOSIS — Y999 Unspecified external cause status: Secondary | ICD-10-CM | POA: Insufficient documentation

## 2017-05-16 LAB — CBC WITH DIFFERENTIAL/PLATELET
BASOS ABS: 0 10*3/uL (ref 0.0–0.1)
Basophils Relative: 0 %
EOS PCT: 1 %
Eosinophils Absolute: 0.1 10*3/uL (ref 0.0–0.7)
HCT: 45.9 % (ref 39.0–52.0)
Hemoglobin: 16.1 g/dL (ref 13.0–17.0)
LYMPHS PCT: 32 %
Lymphs Abs: 2.4 10*3/uL (ref 0.7–4.0)
MCH: 29.9 pg (ref 26.0–34.0)
MCHC: 35.1 g/dL (ref 30.0–36.0)
MCV: 85.3 fL (ref 78.0–100.0)
MONOS PCT: 7 %
Monocytes Absolute: 0.6 10*3/uL (ref 0.1–1.0)
Neutro Abs: 4.5 10*3/uL (ref 1.7–7.7)
Neutrophils Relative %: 60 %
PLATELETS: 204 10*3/uL (ref 150–400)
RBC: 5.38 MIL/uL (ref 4.22–5.81)
RDW: 13 % (ref 11.5–15.5)
WBC: 7.6 10*3/uL (ref 4.0–10.5)

## 2017-05-16 LAB — RAPID URINE DRUG SCREEN, HOSP PERFORMED
Amphetamines: NOT DETECTED
BARBITURATES: NOT DETECTED
Benzodiazepines: NOT DETECTED
COCAINE: NOT DETECTED
Opiates: NOT DETECTED
TETRAHYDROCANNABINOL: POSITIVE — AB

## 2017-05-16 LAB — COMPREHENSIVE METABOLIC PANEL
ALT: 16 U/L — ABNORMAL LOW (ref 17–63)
AST: 16 U/L (ref 15–41)
Albumin: 4.3 g/dL (ref 3.5–5.0)
Alkaline Phosphatase: 71 U/L (ref 38–126)
Anion gap: 9 (ref 5–15)
BILIRUBIN TOTAL: 0.6 mg/dL (ref 0.3–1.2)
BUN: 10 mg/dL (ref 6–20)
CO2: 25 mmol/L (ref 22–32)
Calcium: 8.9 mg/dL (ref 8.9–10.3)
Chloride: 102 mmol/L (ref 101–111)
Creatinine, Ser: 0.94 mg/dL (ref 0.61–1.24)
GFR calc Af Amer: >60 mL/min (ref >60)
GFR calc non Af Amer: >60 mL/min (ref >60)
Glucose, Bld: 101 mg/dL — ABNORMAL HIGH (ref 65–99)
POTASSIUM: 3.6 mmol/L (ref 3.5–5.1)
Sodium: 136 mmol/L (ref 135–145)
TOTAL PROTEIN: 7.2 g/dL (ref 6.5–8.1)

## 2017-05-16 LAB — ETHANOL: Alcohol, Ethyl (B): <5 mg/dL (ref <5)

## 2017-05-16 MED ORDER — TRAZODONE HCL 50 MG PO TABS
50.0000 mg | ORAL_TABLET | Freq: Every evening | ORAL | Status: DC | PRN
  Administered 2017-05-16 – 2017-05-18 (×2): 50 mg via ORAL
  Filled 2017-05-16: qty 2
  Filled 2017-05-16: qty 1

## 2017-05-16 MED ORDER — ALUM & MAG HYDROXIDE-SIMETH 200-200-20 MG/5ML PO SUSP: 30.0000 mL | ORAL | Status: DC | PRN

## 2017-05-16 MED ORDER — IBUPROFEN 400 MG PO TABS
600.0000 mg | ORAL_TABLET | Freq: Three times a day (TID) | ORAL | Status: DC | PRN
  Administered 2017-05-16: 600 mg via ORAL
  Filled 2017-05-16: qty 2

## 2017-05-16 MED ORDER — ACETAMINOPHEN 325 MG PO TABS
650.0000 mg | ORAL_TABLET | Freq: Four times a day (QID) | ORAL | Status: DC | PRN
  Administered 2017-05-20: 650 mg via ORAL
  Filled 2017-05-16: qty 2

## 2017-05-16 MED ORDER — FLUOXETINE HCL 20 MG PO CAPS
20.0000 mg | ORAL_CAPSULE | Freq: Every day | ORAL | Status: DC
  Administered 2017-05-16 – 2017-05-23 (×8): 20 mg via ORAL
  Filled 2017-05-16 (×9): qty 1
  Filled 2017-05-16: qty 7
  Filled 2017-05-16: qty 1

## 2017-05-16 MED ORDER — TRAZODONE HCL 50 MG PO TABS: 50.0000 mg | ORAL_TABLET | Freq: Every day | ORAL | Status: DC

## 2017-05-16 MED ORDER — NICOTINE 21 MG/24HR TD PT24
21.0000 mg | MEDICATED_PATCH | Freq: Every day | TRANSDERMAL | Status: DC
  Administered 2017-05-16: 21 mg via TRANSDERMAL
  Filled 2017-05-16: qty 1

## 2017-05-16 MED ORDER — NICOTINE 21 MG/24HR TD PT24
21.0000 mg | MEDICATED_PATCH | Freq: Every day | TRANSDERMAL | Status: DC
  Administered 2017-05-16 – 2017-05-23 (×8): 21 mg via TRANSDERMAL
  Filled 2017-05-16 (×11): qty 1

## 2017-05-16 MED ORDER — LORAZEPAM 1 MG PO TABS
1.0000 mg | ORAL_TABLET | Freq: Four times a day (QID) | ORAL | Status: DC | PRN
  Administered 2017-05-16: 1 mg via ORAL
  Filled 2017-05-16: qty 1

## 2017-05-16 MED ORDER — MAGNESIUM HYDROXIDE 400 MG/5ML PO SUSP: 30.0000 mL | Freq: Every day | ORAL | Status: DC | PRN

## 2017-05-16 MED ORDER — TRAZODONE HCL 50 MG PO TABS
50.0000 mg | ORAL_TABLET | Freq: Every day | ORAL | Status: DC
  Filled 2017-05-16 (×2): qty 1

## 2017-05-16 MED ORDER — HYDROXYZINE HCL 25 MG PO TABS
25.0000 mg | ORAL_TABLET | Freq: Four times a day (QID) | ORAL | Status: DC | PRN
Start: 2017-05-16 — End: 2017-05-23
  Administered 2017-05-18 (×2): 25 mg via ORAL
  Filled 2017-05-16: qty 1
  Filled 2017-05-16: qty 12

## 2017-05-16 MED ORDER — CITALOPRAM HYDROBROMIDE 20 MG PO TABS
20.0000 mg | ORAL_TABLET | Freq: Every day | ORAL | Status: DC
  Filled 2017-05-16 (×3): qty 1

## 2017-05-16 NOTE — ED Notes (Signed)
[facility] will go to room 407, bed1; Donell SievertSpencer Simon is accepting MD, can arrive after 0800, call report to [phone]

## 2017-05-16 NOTE — Progress Notes (Signed)
Adult Psychoeducational Group Note  Date:  05/16/2017 Time:  10:48 PM  Group Topic/Focus:  Wrap-Up Group:   The focus of this group is to help patients review their daily goal of treatment and discuss progress on daily workbooks.  Participation Level:  Active  Participation Quality:  Appropriate  Affect:  Appropriate  Cognitive:  Alert  Insight: Appropriate  Engagement in Group:  Engaged  Modes of Intervention:  Discussion  Additional Comments:  Patient stated, "my day sucked".   Viggo Perko L Daymond Cordts 05/16/2017, 10:48 PM

## 2017-05-16 NOTE — ED Provider Notes (Signed)
[facility]-EMERGENCY DEPT Provider Note   CSN: [id number] Arrival date & time: 05/16/17  [id number]     History   Chief Complaint Chief Complaint  Patient presents with  . V70.1    HPI Angel [name] is a 36 y.o. male.  The history is provided by the patient.  Mental Health Problem  Presenting symptoms: depression, suicidal thoughts, suicidal threats and suicide attempt   Degree of incapacity (severity):  Severe Onset quality:  Gradual Duration:  2 months Timing:  Constant Progression:  Worsening Chronicity:  Recurrent Context: noncompliance and stressful life event   Context: not alcohol use and not drug abuse   Relieved by:  Nothing Worsened by:  Nothing Associated symptoms: no abdominal pain   pt reports increasing depression for 2 months He became so angry yesterday he punched a trailer with left hand He also reports cutting his forearms in anger He denies any overdose or drug abuse No fever/vomiting Other than left hand, no other pain complaints He called crisis hotline who advised ER evaluation  Past Medical History:  Diagnosis Date  . Anxiety   . Depression   . Migraine     Patient Active Problem List   Diagnosis Date Noted  . Tobacco use disorder 07/17/2015  . Headache syndrome 07/17/2015  . Nicotine dependence 07/17/2015  . Generalized anxiety disorder 11/01/2013  . Major depressive disorder, recurrent episode, severe, without mention of psychotic behavior 10/31/2013    History reviewed. No pertinent surgical history.     Home Medications    Prior to Admission medications   Medication Sig Start Date End Date Taking? Authorizing Provider  citalopram (CELEXA) 20 MG tablet Take 20 mg by mouth daily.    [provider]  ibuprofen (ADVIL,MOTRIN) 200 MG tablet Take 400-600 mg by mouth every 6 (six) hours as needed for headache.    [provider]  traZODone (DESYREL) 100 MG tablet Take 100 mg by mouth at bedtime.    [provider]     Family History Family History  Problem Relation Age of Onset  . Cancer Mother        brain  . Diabetes Brother   . Hypertension Brother     Social History Social History  Substance Use Topics  . Smoking status: Current Every Day Smoker    Packs/day: 1.00    Years: 20.00    Types: Cigarettes  . Smokeless tobacco: Never Used  . Alcohol use No     Allergies   Patient has no known allergies.   Review of Systems Review of Systems  Constitutional: Negative for fever.  Gastrointestinal: Negative for abdominal pain.  Musculoskeletal: Positive for arthralgias.  Skin: Positive for wound.  Psychiatric/Behavioral: Positive for suicidal ideas.  All other systems reviewed and are negative.    Physical Exam Updated Vital Signs There were no vitals taken for this visit.  Physical Exam  CONSTITUTIONAL: Disheveled, no distress HEAD: Normocephalic/atraumatic EYES: EOMI ENMT: Mucous membranes moist NECK: supple no meningeal signs SPINE/BACK:entire spine nontender CV: S1/S2 noted, no murmurs/rubs/gallops noted LUNGS: Lungs are clear to auscultation bilaterally, no apparent distress ABDOMEN: soft, nontender NEURO: Pt is awake/alert/appropriate, moves all extremitiesx4.  No facial droop.   EXTREMITIES: pulses normal/equal, full ROM Scattered superficial lacerations to both forearms.  Tenderness/swelling to left hand.  No lacerations to left hand SKIN: warm, color normal PSYCH: appears depressed.  Poor eye contact  ED Treatments / Results  Labs (all labs ordered are listed, but only abnormal results are displayed) Labs  Reviewed  CBC WITH DIFFERENTIAL/PLATELET  COMPREHENSIVE METABOLIC PANEL  ETHANOL  RAPID URINE DRUG SCREEN, HOSP PERFORMED    EKG  EKG Interpretation None       Radiology No results found.  Procedures Procedures    Medications Ordered in ED Medications  ibuprofen (ADVIL,MOTRIN) tablet 600 mg (600 mg Oral Given 05/16/17 0417)  nicotine  (NICODERM CQ - dosed in mg/24 hours) patch 21 mg (21 mg Transdermal Patch Applied 05/16/17 0417)  LORazepam (ATIVAN) tablet 1 mg (1 mg Oral Given 05/16/17 0417)  citalopram (CELEXA) tablet 20 mg (not administered)     Initial Impression / Assessment and Plan / ED Course  I have reviewed the triage vital signs and the nursing notes.  Pertinent labs & imaging results that were available during my care of the patient were reviewed by me and considered in my medical decision making (see chart for details).     Pt stable He appears depressed He is here voluntarily Awaiting psych evaluation He will likely need admit Pt medically stable at this time   Final Clinical Impressions(s) / ED Diagnoses   Final diagnoses:  Suicidal ideation  Contusion of left hand, initial encounter    New Prescriptions New Prescriptions   No medications on file     Zadie Rhine, MD 05/16/17 0430

## 2017-05-16 NOTE — Progress Notes (Signed)
Admission note:  Pt, who prefers to be called Angel [name], was admitted to [facility] hall voluntarily from [facility] with a plan to cut his wrists and bleed to death. Upon admission, he was cooperative and polite. He denied SI and said his last thoughts of self harm were yesterday. He denied HI, AVH, and pain and contracted for safety while on the unit. He said he has a long history of cutting -- only on his forearms, and his last instance was a week ago. His bilateral forearms have superficial scratches that are open to air. His admission skin assessment does not yield any other instances of cutting -- he has a few skin tags, some acne to his torso, and a tattoo on his right forearm.   Pt says he lives with a friend but has no sources of social support. He says he's having financial difficulties and will have trouble affording medication on discharge. He reported drinking only rarely, denied using drugs (UDS positive for THC), and said he smoked 1.5 PPD. He requested a nicotine patch and said he would like to quit. He reported that he gets "ringing in his ears" as well as migraines occasionally. He denied any hx of abuse.   Pt was oriented to unit and hall. He was urged to approach staff with needs, concerns, and questions. He remains safe with 15-minute checks and rounding in place. Will continue to monitor for needs and safety.

## 2017-05-16 NOTE — ED Triage Notes (Signed)
Pt has plan to kill himself by slitting his wrists. Pt states he has been depressed x 2 months. Pt states he called crisis hotline tonight.

## 2017-05-16 NOTE — Tx Team (Signed)
Initial Treatment Plan 05/16/2017 11:35 AM Angel DoctorMichael R Freund ZOX:[id number]    PATIENT STRESSORS: Financial difficulties Medication change or noncompliance   PATIENT STRENGTHS: Communication skills General fund of knowledge Physical Health   PATIENT IDENTIFIED PROBLEMS: Depression  Self-harm and suicidal thoughts  "low self-esteem"  "Not to cut myself no more"               DISCHARGE CRITERIA:  Ability to meet basic life and health needs Improved stabilization in mood, thinking, and/or behavior Medical problems require only outpatient monitoring  PRELIMINARY DISCHARGE PLAN: Outpatient therapy  PATIENT/FAMILY INVOLVEMENT: This treatment plan has been presented to and reviewed with the patient, Angel [name].  The patient and family have been given the opportunity to ask questions and make suggestions.  Angel SimmeringShugart, Ansel Ferrall M, RN 05/16/2017, 11:35 AM

## 2017-05-16 NOTE — [facility] Group Notes (Signed)
[facility] 05/16/2017 1:15pm  Type of Therapy: Mental Health Association Presentation  Participation Level: Pt invited. Did not attend.   Vito BackersLynn B. Beverely PaceBryant, MSW, LCSWA 05/16/2017 2:40 PM

## 2017-05-16 NOTE — [facility] Suicide Risk Assessment (Addendum)
[facility]   Nursing information obtained from:   patient and chart  Demographic factors:   36 year old single male, no children, lives a roommate, unemployed, no current source of income Current Mental Status:   see below Loss Factors:   financial stressors, housing stressors ( roommates son and son's GF recently moved in)  Historical Factors:   history of depression, history of social anxiety symptoms, history of prior psychiatric admissions Risk Reduction Factors:   resilience   Total Time spent with patient: 45 minutes Principal Problem: Depression Diagnosis:   Patient Active Problem List   Diagnosis Date Noted  . Major depressive disorder, recurrent episode (HCC) [F33.9] 05/16/2017  . Tobacco use disorder [F17.200] 07/17/2015  . Headache syndrome [G44.89] 07/17/2015  . Nicotine dependence [F17.200] 07/17/2015  . Generalized anxiety disorder [F41.1] 11/01/2013  . Major depressive disorder, recurrent episode, severe, without mention of psychotic behavior [F33.2] 10/31/2013    Continued Clinical Symptoms:  Alcohol Use Disorder Identification Test Final Score (AUDIT): 4 The "Alcohol Use Disorders Identification Test", Guidelines for Use in Primary Care, Second Edition.  World Science writerHealth Organization [facility](WHO). Score between 0-7:  no or low risk or alcohol related problems. Score between 8-15:  moderate risk of alcohol related problems. Score between 16-19:  high risk of alcohol related problems. Score 20 or above:  warrants further diagnostic evaluation for alcohol dependence and treatment.   CLINICAL FACTORS:  36 year old male- see social history above .  Presented to hospital voluntarily , reported worsening depression, which he states is chronic but worsening,suicidal ideations , with thoughts of cutting wrists. Of note, he has several superficial cuts on forearms, no bleeding, no sutures. States these were self inflicted and occurred within the last 2-3  days. Endorses neuro-vegetative symptoms- poor appetite, poor energy level, poor sleep, anhedonia. Denies psychotic symptoms. He states he has been feeling very stressed, and describes mainly financial . He also states that roommate's son and girlfriend recently moved in, making the home environment more tense . He reports he had been prescribed Celexa in the past , but states he has been off it for 2-3 months because " it stopped working ".  He has had several prior psychiatric admissions, most recently 2016 . States admissions have been fore depression. Denies history of suicide attempts, but describes history of self cutting in the past. Denies history of psychosis. Denies history of mania. Denies history of PTSD. Describes history of social anxiety, anxious when speaking in front of others.  Denies medical illnesses, NKDA. Smokes 1 PPD .  Denies alcohol or drug abuse . ( UDS positive for cannabis- states he smokes occasionally)   Mother deceased ( brain cancer) , father alive, patient states he is distant from father, has two brothers and a sister. States there is a strong history of alcohol use disorder in family ( uncles )  Dx- MDD, severe, no psychotic symptoms.  Social Anxiety by history   Plan- inpatient admission We discussed treatment options, [facility] start Prozac at 20 mgrs QDAY    Musculoskeletal: Strength & Muscle Tone: within normal limits Gait & Station: normal Patient leans: N/A  Psychiatric Specialty Exam: Physical Exam  ROS no headache, no chest pain, no vomiting   Blood pressure 112/73, pulse 93, temperature 98.7 F (37.1 C), temperature source Oral, resp. rate 16, height 5' 6.75" (1.695 m), weight 82.6 kg (182 lb).Body mass index is 28.72 kg/m.  General Appearance: Fairly Groomed  Eye Contact:  Good  Speech:  Normal Rate  Volume:  Decreased  Mood:  Anxious and Depressed  Affect:  constricted, anxious, improves partially as session progresses    Thought Process:  Linear and Descriptions of Associations: Intact  Orientation:  Full (Time, Place, and Person)- he is fully oriented x 3   Thought Content:  no hallucinations, no delusions, not internally preoccupied   Suicidal Thoughts:  No denies any current suicidal or self injurious ideations, denies any homicidal or violent ideations  Homicidal Thoughts:  No  Memory:  recent and remote grossly intact   Judgement:  Fair  Insight:  Fair  Psychomotor Activity:  Decreased  Concentration:  Concentration: Fair and Attention Span: Fair  Recall:  Good  Fund of Knowledge:  Good  Language:  Good  Akathisia:  Negative  Handed:  Left  AIMS (if indicated):     Assets:  Desire for Improvement Resilience  ADL's:  Fair   Cognition:  WNL  Sleep:         COGNITIVE FEATURES THAT CONTRIBUTE TO RISK:  Closed-mindedness and Loss of executive function    SUICIDE RISK:   Moderate:  Frequent suicidal ideation with limited intensity, and duration, some specificity in terms of plans, no associated intent, good self-control, limited dysphoria/symptomatology, some risk factors present, and identifiable protective factors, including available and accessible social support.  PLAN OF CARE: Patient will be admitted to inpatient psychiatric unit for stabilization and safety. Will provide and encourage milieu participation. Provide medication management and maked adjustments as needed.  Will follow daily.    I certify that inpatient services furnished can reasonably be expected to improve the patient's condition.   Craige Cotta, MD 05/16/2017, 4:24 PM

## 2017-05-16 NOTE — BH Assessment (Addendum)
Tele Assessment Note   Angel [name] is an 36 y.o. male who came voluntarily to the [facility] tonight c/o SI with a plan to cut his wrists to bleed to death. Pt denies HI and AVH. Pt sts he has attempted suicide once when he was 36 yo and he was hospitalized following the attempt. Pt has a hx of superficial cutting that began in his early 45s and continues at this time. Pt sts his last cutting was about 1 week ago and healing wounds were visible. Pt sts he has been struggling financially for years due to unemployment and has not been able to get approved for disability income. Pt was homeless but now sts he lives with a friend. Pt sts he has been psychiatrically hospitalized on 4 occasions with the most recent at [facility] in 2016. Pt has previously been diagnosed with MDD and GAD. Pt sts he does not currently see a provider for medication management or therapy. Pt sts he is not currently prescribed any psychiatric medications. Pt's symptoms of depression including sadness, fatigue, excessive guilt, decreased self esteem, tearfulness / crying spells, self isolation, lack of motivation for activities and pleasure, irritability, negative outlook, difficulty thinking & concentrating, feeling helpless and hopeless, sleep and eating disturbances. Pt sts his most recent bout of depression started about 2 months ago.Pt sts he has a hx of panic attacks with his last attack occurring 2 days ago. Pt sts st times he has 1-2 attacks per day.   Pt sts he has never been married and has no children. Pt sts he has no support from his family. Pt has a 7th grade education. Pt sts his biggest stressor is being able to get money for food. Pt sts he sometimes punchs walls when angry and has thrown objects in the past. Pt denies hurting anyone. Pt sts he has no legal hx with LE and has no pending charges. Pt sts he has no access to guns. Pt's paternal aunt attempted suicide and has a hx of depression. Pt sts he averages about 5 hours of  sleep each night and struggles to eat regularly. Pt denies any hx of abuse. Pt sts he smokes cigarettes (about 1 pack daily) and tested positive for Cannabis tonight in the ED although he denies current use.  Pt was dressed in scrubs and was sitting on hishospital bed. Pt was alert, cooperative and pleasant. Pt kept good eye contact, spoke in a clear tone and at a normal pace. Pt moved in a normal manner when moving. Pt's thought process was coherent and relevant and judgement was impaired.  No indication of delusional thinking or response to internal stimuli. Pt's mood was stated as depressed and anxious and his blunted affect was congruent.  Pt was oriented x 4, to person, place, time and situation.   Diagnosis: MDD, Recurrent, Severe without psychotic features; GAD by hx  Past Medical History:  Past Medical History:  Diagnosis Date  . Anxiety   . Depression   . Migraine     History reviewed. No pertinent surgical history.  Family History:  Family History  Problem Relation Age of Onset  . Cancer Mother        brain  . Diabetes Brother   . Hypertension Brother     Social History:  reports that he has been smoking Cigarettes.  He has a 20.00 pack-year smoking history. He has never used smokeless tobacco. He reports that he does not drink alcohol or use drugs.  Additional  Social History:  Alcohol / Drug Use Prescriptions: SEE MAR History of alcohol / drug use?: Yes Longest period of sobriety (when/how long): UNKNOWN Substance #1 Name of Substance 1: CANNABIS 1 - Age of First Use: 15 1 - Amount (size/oz): VARIED 1 - Frequency: VARIED 1 - Duration: YEARS 1 - Last Use / Amount: STS STOPPED IN 2016 Substance #2 Name of Substance 2: NICOTINE/CIGARETTES 2 - Age of First Use: 15 2 - Amount (size/oz): 1 PACK 2 - Frequency: DAILY 2 - Duration: ONGOING 2 - Last Use / Amount: YESTERDAY  CIWA:   COWS:    PATIENT STRENGTHS: (choose at least two) Average or above average  intelligence Capable of independent living Communication skills  Allergies: No Known Allergies  Home Medications:  (Not in a hospital admission)  OB/GYN Status:  No LMP for male patient.  General Assessment Data Location of Assessment: [facility] TTS Assessment: In system Is this a Tele or Face-to-Face Assessment?: Tele Assessment Is this an Initial Assessment or a Re-assessment for this encounter?: Initial Assessment Marital status: Single Living Arrangements: Non-relatives/Friends Can pt return to current living arrangement?: Yes Admission Status: Voluntary Is patient capable of signing voluntary admission?: Yes Referral Source: Self/Family/Friend Insurance type:  (SELF PAY)     Crisis Care Plan Living Arrangements: Non-relatives/Friends Name of Psychiatrist:  (NONE) Name of Therapist:  (NONE)  Education Status Is patient currently in school?: No Highest grade of school patient has completed:  (7)  Risk to self with the past 6 months Suicidal Ideation: Yes-Currently Present Has patient been a risk to self within the past 6 months prior to admission? : Yes Suicidal Intent: Yes-Currently Present Has patient had any suicidal intent within the past 6 months prior to admission? : Yes Is patient at risk for suicide?: Yes Suicidal Plan?: Yes-Currently Present Has patient had any suicidal plan within the past 6 months prior to admission? : Yes Specify Current Suicidal Plan:  (TO CUT HIS WRISTS TO BLEED TO DEATH) Access to Means: Yes (STS NO ACCESS TO GUNS) Specify Access to Suicidal Means:  (KNIFE) What has been your use of drugs/alcohol within the last 12 months?:  (REGULAR USE) Previous Attempts/Gestures: Yes How many times?:  (1 AT 36 YO) Other Self Harm Risks:  (HX OF CUTTING) Triggers for Past Attempts: Unpredictable Intentional Self Injurious Behavior: Cutting, Damaging (CUTTING CURRENTLY; PUNCHES WALLS WHEN ANGRY) Family Suicide History: Yes (PAT AUNT  ATTEMPTED) Recent stressful life event(s): Financial Problems Persecutory voices/beliefs?: No Depression: Yes Depression Symptoms: Tearfulness, Isolating, Fatigue, Guilt, Loss of interest in usual pleasures, Feeling worthless/self pity, Feeling angry/irritable Substance abuse history and/or treatment for substance abuse?: Yes (NO TX REPORTED) Suicide prevention information given to non-admitted patients: Not applicable  Risk to Others within the past 6 months Homicidal Ideation: No (DENIES) Does patient have any lifetime risk of violence toward others beyond the six months prior to admission? : No (DENIES) Thoughts of Harm to Others: No (DENIES) Current Homicidal Intent: No Current Homicidal Plan: No Access to Homicidal Means: No Identified Victim:  (NONE) History of harm to others?: No Assessment of Violence: None Noted Does patient have access to weapons?: No Criminal Charges Pending?: No (DENIES ANY ARRESTS) Does patient have a court date: No Is patient on probation?: No  Psychosis Hallucinations: None noted (DENIES) Delusions: None noted  Mental Status Report Appearance/Hygiene: Disheveled Eye Contact: Good Motor Activity: Freedom of movement Speech: Logical/coherent Level of Consciousness: Quiet/awake Mood: Depressed Affect: Blunted, Depressed Anxiety Level: Minimal Thought Processes: Coherent, Relevant  Judgement: Impaired Orientation: Person, Place, Time, Situation Obsessive Compulsive Thoughts/Behaviors: None  Cognitive Functioning Concentration: Decreased Memory: Recent Intact, Remote Intact IQ: Average Insight: Poor Impulse Control: Fair Appetite: Fair Weight Loss:  (0) Weight Gain:  (0) Sleep: No Change Total Hours of Sleep:  (5) Vegetative Symptoms: Decreased grooming  ADLScreening [facility] Assessment Services) Patient's cognitive ability adequate to safely complete daily activities?: Yes Patient able to express need for assistance with ADLs?:  Yes Independently performs ADLs?: Yes (appropriate for developmental age)  Prior Inpatient Therapy Prior Inpatient Therapy: Yes Prior Therapy Dates:  (M ULTIPLE FROM AGE 35 YO) Prior Therapy Facilty/Provider(s):  ([facility]) Reason for Treatment:  (MDD, GAD)  Prior Outpatient Therapy Prior Outpatient Therapy: Yes Prior Therapy Dates:  (2016-2017) Prior Therapy Facilty/Provider(s):  [facility]) Reason for Treatment:  (MDD, GAD) Does patient have an ACCT team?: No Does patient have Intensive In-House Services?  : No Does patient have [facility] services? : No Does patient have P4CC services?: No  ADL Screening (condition at time of admission) Patient's cognitive ability adequate to safely complete daily activities?: Yes Patient able to express need for assistance with ADLs?: Yes Independently performs ADLs?: Yes (appropriate for developmental age)       Abuse/Neglect Assessment (Assessment to be complete while patient is alone) Physical Abuse: Denies Verbal Abuse: Denies Sexual Abuse: Denies Exploitation of patient/patient's resources: Denies Self-Neglect: Denies     Merchant navy officerAdvance Directives (For Healthcare) Does Patient Have a Medical Advance Directive?: No Would patient like information on creating a medical advance directive?: No - Patient declined    Additional Information 1:1 In Past 12 Months?: No CIRT Risk: No Elopement Risk: No Does patient have medical clearance?: Yes     Disposition:  Disposition Initial Assessment Completed for this Encounter: Yes Disposition of Patient: Other dispositions Other disposition(s): Other (Comment) (PENDING REVIEW W [facility] EXTENDER)  Recommend IP tx per Donell SievertSpencer Simon, PA.  [facility] Fransico MichaelKim Brooks, AC. Room 407-1. Pt coming after 8 am   Spoke with Dr. Bebe ShaggyWickline, EDP at [facility] and advised of recommendation.   Beryle FlockMary Kash [place], MS, CRC, [facility] [place] [facility] Triage Specialist [facility] Kamuela Magos T 05/16/2017 5:00 AM

## 2017-05-16 NOTE — Progress Notes (Signed)
D: Pt was in bed in his room upon initial approach.  Pt presents with depressed affect and mood.  He has poor eye contact and forwards little information to Clinical research associatewriter.  Writer and pt made a goal for pt to be safe tonight.  Pt denies SI/HI, denies hallucinations, denies pain.  Pt stayed in his room for the majority of the night, when he was visible in milieu he primarily kept to himself.  Pt attended evening group.    A: Introduced self to pt.  Actively listened to pt and offered support and encouragement.   PRN medication administered for sleep.  Q15 minute safety checks maintained.  R: Pt is safe on the unit.  Pt is compliant with medication.  Pt verbally contracts for safety.  Will continue to monitor and assess.

## 2017-05-16 NOTE — H&P (Signed)
Psychiatric Admission Assessment Adult  Patient Identification: Angel [name]  MRN:  [id number]  Date of Evaluation:  05/17/2017  Chief Complaint: Worsening symptoms of depression triggering suicidal ideations & an attempt by superficial cuts to bilateral arms.  Principal Diagnosis: Major depressive disorder, recurrent episodes, severe without psychotic features.  Diagnosis:   Patient Active Problem List   Diagnosis Date Noted  . Major depressive disorder, recurrent episode, severe, without mention of psychotic behavior [F33.2] 10/31/2013    Priority: High  . Generalized anxiety disorder [F41.1] 11/01/2013    Priority: Medium  . Major depressive disorder, recurrent episode (HCC) [F33.9] 05/16/2017  . Tobacco use disorder [F17.200] 07/17/2015  . Headache syndrome [G44.89] 07/17/2015  . Nicotine dependence [F17.200] 07/17/2015   History of Present Illness: Angel [name] is a 36 year old Caucasian male with hx of chronic depression & cannabis use disorder. Presented to hospital voluntarily , reported worsening depression, which he states is chronic but worsening,suicidal ideations , with thoughts of cutting wrists. Of note, he has several superficial cuts on forearms, no bleeding, no sutures. States these were self inflicted and occurred within the last 2-3 days. Endorses neuro-vegetative symptoms- poor appetite, poor energy level, poor sleep, anhedonia. Denies psychotic symptoms. He states he has been feeling very stressed, and describes mainly financial . He also states that roommate's son and girlfriend recently moved in, making the home environment more tense. He reports he had been prescribed Celexa in the past , but states he has been off it for 2-3 months because " it stopped working ". He has had several prior psychiatric admissions, most recently 2016 . States admissions have been fore depression. Denies history of suicide attempts, but describes history of self cutting in the past. Denies  history of psychosis. Denies history of mania. Denies history of PTSD. Describes history of social anxiety, anxious when speaking in front of others.  Associated Signs/Symptoms:  Depression Symptoms:  depressed mood,  (Hypo) Manic Symptoms:  Impulsivity,  Anxiety Symptoms:  Excessive Worry,  Psychotic Symptoms:  Denies any hallucinations, delusional thoughts or paranoia.  PTSD Symptoms: None reported  Total Time spent with patient: 1 hour  Past Psychiatric History: Major depressive disorder  Is the patient at risk to self? Yes.    Has the patient been a risk to self in the past 6 months? Yes.    Has the patient been a risk to self within the distant past? Yes.    Is the patient a risk to others? No.  Has the patient been a risk to others in the past 6 months? No.  Has the patient been a risk to others within the distant past? No.   Prior Inpatient Therapy: Yes, [facility] x multiple times. Prior Outpatient Therapy: Yes, [facility] in [place].  Alcohol Screening: 1. How often do you have a drink containing alcohol?: Monthly or less 2. How many drinks containing alcohol do you have on a typical day when you are drinking?: 3 or 4 3. How often do you have six or more drinks on one occasion?: Less than monthly Preliminary Score: 2 4. How often during the last year have you found that you were not able to stop drinking once you had started?: Less than monthly 5. How often during the last year have you failed to do what was normally expected from you becasue of drinking?: Never 6. How often during the last year have you needed a first drink in the morning to get yourself going after a heavy  drinking session?: Never 7. How often during the last year have you had a feeling of guilt of remorse after drinking?: Never 8. How often during the last year have you been unable to remember what happened the night before because you had been drinking?: Never 9. Have you or someone else been  injured as a result of your drinking?: No 10. Has a relative or friend or a doctor or another health worker been concerned about your drinking or suggested you cut down?: No Alcohol Use Disorder Identification Test Final Score (AUDIT): 4 Brief Intervention: AUDIT score less than 7 or less-screening does not suggest unhealthy drinking-brief intervention not indicated  Substance Abuse History in the last 12 months:  Yes.    Consequences of Substance Abuse: Medical Consequences:  Liver damage, Possible death by overdose Legal Consequences:  Arrests, jail time, Loss of driving privilege. Family Consequences:  Family discord, divorce and or separation.  Previous Psychotropic Medications: Yes, (Citalopram)  Psychological Evaluations: No   Past Medical History:  Past Medical History:  Diagnosis Date  . Anxiety   . Depression   . Migraine    History reviewed. No pertinent surgical history.  Family History:  Family History  Problem Relation Age of Onset  . Cancer Mother        brain  . Diabetes Brother   . Hypertension Brother    Family Psychiatric  History: Major depression: Paternal/maternal aunts. Denies any familial hx of suicide.  Tobacco Screening: Have you used any form of tobacco in the last 30 days? (Cigarettes, Smokeless Tobacco, Cigars, and/or Pipes): Yes Tobacco use, Select all that apply: 5 or more cigarettes per day Are you interested in Tobacco Cessation Medications?: Yes, will notify MD for an order Counseled patient on smoking cessation including recognizing danger situations, developing coping skills and basic information about quitting provided: Yes  Social History:  History  Alcohol Use No     History  Drug Use No    Additional Social History: Single.  Allergies:  No Known Allergies  Lab Results:  Results for orders placed or performed during the hospital encounter of 05/16/17 (from the past 48 hour(s))  Lipid panel     Status: Abnormal   Collection  Time: 05/17/17  6:30 AM  Result Value Ref Range   Cholesterol 202 (H) 0 - 200 mg/dL   Triglycerides 161 (H) <150 mg/dL   HDL 41 >09 mg/dL   Total CHOL/HDL Ratio 4.9 RATIO   VLDL 33 0 - 40 mg/dL   LDL Cholesterol 604 (H) 0 - 99 mg/dL    Comment:        Total Cholesterol/HDL:CHD Risk Coronary Heart Disease Risk Table                     Men   Women  1/2 Average Risk   3.4   3.3  Average Risk       5.0   4.4  2 X Average Risk   9.6   7.1  3 X Average Risk  23.4   11.0        Use the calculated Patient Ratio above and the CHD Risk Table to determine the patient's CHD Risk.        ATP III CLASSIFICATION (LDL):  <100     mg/dL   Optimal  540-981  mg/dL   Near or Above                    Optimal  130-159  mg/dL   Borderline  161-096  mg/dL   High  >045     mg/dL   Very High Performed at [facility], [address], [place], [place] [address]   TSH     Status: None   Collection Time: 05/17/17  6:30 AM  Result Value Ref Range   TSH 0.939 0.350 - 4.500 uIU/mL    Comment: Performed by a 3rd Generation assay with a functional sensitivity of <=0.01 uIU/mL. Performed at [facility], [address] [place], [place] [address]    Blood Alcohol level:  Lab Results  Component Value Date   ETH <5 05/16/2017   ETH 5 12/27/2014   Metabolic Disorder Labs:  No results found for: HGBA1C, MPG No results found for: PROLACTIN Lab Results  Component Value Date   CHOL 202 (H) 05/17/2017   TRIG 165 (H) 05/17/2017   HDL 41 05/17/2017   CHOLHDL 4.9 05/17/2017   VLDL 33 05/17/2017   LDLCALC 128 (H) 05/17/2017    Current Medications: Current Facility-Administered Medications  Medication Dose Route Frequency Provider Last Rate Last Dose  . acetaminophen (TYLENOL) tablet 650 mg  650 mg Oral Q6H PRN Vertis Scheib I, NP      . alum & mag hydroxide-simeth (MAALOX/MYLANTA) 200-200-20 MG/5ML suspension 30 mL  30 mL Oral Q4H PRN Sammy Douthitt I, NP      . FLUoxetine  (PROZAC) capsule 20 mg  20 mg Oral Daily Cobos, Rockey Situ, MD   20 mg at 05/17/17 0801  . hydrOXYzine (ATARAX/VISTARIL) tablet 25 mg  25 mg Oral Q6H PRN Melynda Krzywicki I, NP      . magnesium hydroxide (MILK OF MAGNESIA) suspension 30 mL  30 mL Oral Daily PRN Chrstopher Malenfant I, NP      . nicotine (NICODERM CQ - dosed in mg/24 hours) patch 21 mg  21 mg Transdermal Q0600 Armandina Stammer I, NP   21 mg at 05/17/17 0801  . traZODone (DESYREL) tablet 50 mg  50 mg Oral QHS PRN Cobos, Rockey Situ, MD   50 mg at 05/16/17 2109   PTA Medications: Prescriptions Prior to Admission  Medication Sig Dispense Refill Last Dose  . citalopram (CELEXA) 20 MG tablet Take 20 mg by mouth daily.   05/15/2017 at Unknown time  . ibuprofen (ADVIL,MOTRIN) 200 MG tablet Take 400-600 mg by mouth every 6 (six) hours as needed for headache.   Taking  . traZODone (DESYREL) 100 MG tablet Take 100 mg by mouth at bedtime.   Past Week at Unknown time   Musculoskeletal: Strength & Muscle Tone: within normal limits Gait & Station: normal Patient leans: N/A  Psychiatric Specialty Exam: Physical Exam  Constitutional: He is oriented to person, place, and time. He appears well-developed.  HENT:  Head: Normocephalic.  Eyes: Pupils are equal, round, and reactive to light.  Neck: Normal range of motion.  Cardiovascular: Normal rate.   Respiratory: Effort normal.  GI: Soft.  Genitourinary:  Genitourinary Comments: Deferred  Musculoskeletal: Normal range of motion.  Neurological: He is alert and oriented to person, place, and time.  Skin: Skin is warm and dry.    Review of Systems  Constitutional: Negative.   HENT: Negative.   Eyes: Negative.   Respiratory: Negative.   Cardiovascular: Negative.   Gastrointestinal: Negative.   Genitourinary: Negative.   Skin: Negative.   Endo/Heme/Allergies: Negative.   Psychiatric/Behavioral: Positive for depression and substance abuse (UDS positive for THC). Negative for memory loss. The  patient  is nervous/anxious and has insomnia.     Blood pressure 112/73, pulse 93, temperature 98.7 F (37.1 C), temperature source Oral, resp. rate 16, height 5' 6.75" (1.695 m), weight 82.6 kg (182 lb).Body mass index is 28.72 kg/m.  General Appearance: Disheveled.  Eye Contact:  Good  Speech:  Normal Rate  Volume:  Decreased  Mood:  Anxious and Depressed  Affect:  constricted, anxious, improves partially as session progresses   Thought Process:  Linear and Descriptions of Associations: Intact  Orientation:  Full (Time, Place, and Person)- he is fully oriented x 3   Thought Content:  no hallucinations, no delusions, not internally preoccupied   Suicidal Thoughts:  No denies any current suicidal or self injurious ideations, denies any homicidal or violent ideations  Homicidal Thoughts:  No  Memory:  recent and remote grossly intact   Judgement:  Fair  Insight:  Fair  Psychomotor Activity:  Decreased  Concentration:  Concentration: Fair and Attention Span: Fair  Recall:  Good  Fund of Knowledge:  Good  Language:  Good  Akathisia:  Negative  Handed:  Left  AIMS (if indicated):     Assets:  Desire for Improvement Resilience  ADL's:  Fair   Cognition:  WNL  Sleep:  6.25     Treatment Plan/Recommendations: 1. Admit for crisis management and stabilization, estimated length of stay 3-5 days.  2. Medication management to reduce current symptoms to base line and improve the patient's overall level of functioning: See MAR , Md's SRA & treatment plan. 3. Treat health problems as indicated.  4. Develop treatment plan to decrease risk of relapse upon discharge and the need for readmission.  5. Psycho-social education regarding relapse prevention and self care.  6. Health care follow up as needed for medical problems.  7. Review, reconcile, and reinstate any pertinent home medications for other health issues where appropriate. 8. Call for consults with hospitalist for any additional specialty  patient care services as needed.  Observation Level/Precautions:  15 minute checks  Laboratory:  Per ED, UDS positive for THC.  Psychotherapy: Group sessions  Medications: See MAR   Consultations: As needed  Discharge Concerns: Safety, mood stability   Estimated LOS: 3-5 days  Other: Admit to the [facility].    Physician Treatment Plan for Primary Diagnosis: Will initiate medication management for mood stability. Set up an outpatient psychiatric services for medication management. Will encourage medication adherence with psychiatric medications.  Long Term Goal(s): Improvement in symptoms so as ready for discharge  Short Term Goals: Ability to identify changes in lifestyle to reduce recurrence of condition will improve, Ability to verbalize feelings will improve and Ability to demonstrate self-control will improve  Physician Treatment Plan for Secondary Diagnosis: Active Problems:   Major depressive disorder, recurrent episode (HCC)  Long Term Goal(s): Improvement in symptoms so as ready for discharge  Short Term Goals: Ability to identify and develop effective coping behaviors will improve, Compliance with prescribed medications will improve and Ability to identify triggers associated with substance abuse/mental health issues will improve  I certify that inpatient services furnished can reasonably be expected to improve the patient's condition.    Sanjuana KavaNwoko, Daimion Adamcik I, NP, PMHNP, FNP-BC 6/21/20189:30 AM

## 2017-05-16 NOTE — Plan of Care (Signed)
Problem: Safety: Goal: Periods of time without injury will increase Outcome: Progressing Pt remains safe on the unit.

## 2017-05-17 LAB — LIPID PANEL
CHOL/HDL RATIO: 4.9 ratio
Cholesterol: 202 mg/dL — ABNORMAL HIGH (ref 0–200)
HDL: 41 mg/dL (ref >40)
LDL Cholesterol: 128 mg/dL — ABNORMAL HIGH (ref 0–99)
Triglycerides: 165 mg/dL — ABNORMAL HIGH (ref <150)
VLDL: 33 mg/dL (ref 0–40)

## 2017-05-17 LAB — TSH: TSH: 0.939 u[IU]/mL (ref 0.350–4.500)

## 2017-05-17 MED ORDER — ARIPIPRAZOLE 2 MG PO TABS
2.0000 mg | ORAL_TABLET | Freq: Every day | ORAL | Status: DC
  Administered 2017-05-17 – 2017-05-18 (×2): 2 mg via ORAL
  Filled 2017-05-17 (×4): qty 1

## 2017-05-17 NOTE — [facility] Group Notes (Signed)
[facility] Group Notes:  (Nursing/MHT/Case Management/Adjunct)  Date:  05/17/2017  Time:  0850  Type of Therapy:  Nurse Education  Participation Level:  Active  Participation Quality:  Appropriate and Attentive  Affect:  Appropriate  Cognitive:  Alert  Insight:  Improving  Engagement in Group:  Engaged  Modes of Intervention:  Support  Summary of Progress/Problems: Patient's goal today is to "stay awake."  Patient was engaged and asked appropriate questions.  Cranford MonBeaudry, Dimas Scheck Evans 05/17/2017, 3:54 PM

## 2017-05-17 NOTE — [facility] Suicide Risk Assessment (Signed)
[facility] INPATIENT:  Family/Significant Other Suicide Prevention Education  Suicide Prevention Education:  Education Completed; Angel [name], Pt's brother [phone], has been identified by the patient as the family member/significant other with whom the patient will be residing, and identified as the person(s) who will aid the patient in the event of a mental health crisis (suicidal ideations/suicide attempt).  With written consent from the patient, the family member/significant other has been provided the following suicide prevention education, prior to the and/or following the discharge of the patient.  The suicide prevention education provided includes the following:  Suicide risk factors  Suicide prevention and interventions  National Suicide Hotline telephone number  [name] assessment telephone number  [facility] Emergency Assistance 911  [facility] and/or Residential Mobile Crisis Unit telephone number  Request made of family/significant other to:  Remove weapons (e.g., guns, rifles, knives), all items previously/currently identified as safety concern.    Remove drugs/medications (over-the-counter, prescriptions, illicit drugs), all items previously/currently identified as a safety concern.  The family member/significant other verbalizes understanding of the suicide prevention education information provided.  The family member/significant other agrees to remove the items of safety concern listed above.  Angel [name] 05/17/2017, 3:50 PM

## 2017-05-17 NOTE — Tx Team (Signed)
Interdisciplinary Treatment and Diagnostic Plan Update 05/17/2017 Time of Session: 9:30am  LAVAN IMES  MRN: [id number]  Principal Diagnosis: Major depressive disorder, recurrent episodes, severe without psychotic features.  Secondary Diagnoses: Active Problems:   Major depressive disorder, recurrent episode ([place])   Current Medications:  Current Facility-Administered Medications  Medication Dose Route Frequency Provider Last Rate Last Dose  . acetaminophen (TYLENOL) tablet 650 mg  650 mg Oral Q6H PRN Nwoko, Agnes I, NP      . alum & mag hydroxide-simeth (MAALOX/MYLANTA) 200-200-20 MG/5ML suspension 30 mL  30 mL Oral Q4H PRN Nwoko, Agnes I, NP      . ARIPiprazole (ABILIFY) tablet 2 mg  2 mg Oral Daily Cobos, Myer Peer, MD   2 mg at 05/17/17 1430  . FLUoxetine (PROZAC) capsule 20 mg  20 mg Oral Daily Cobos, Myer Peer, MD   20 mg at 05/17/17 0801  . hydrOXYzine (ATARAX/VISTARIL) tablet 25 mg  25 mg Oral Q6H PRN Nwoko, Agnes I, NP      . magnesium hydroxide (MILK OF MAGNESIA) suspension 30 mL  30 mL Oral Daily PRN Nwoko, Agnes I, NP      . nicotine (NICODERM CQ - dosed in mg/24 hours) patch 21 mg  21 mg Transdermal Q0600 Lindell Spar I, NP   21 mg at 05/17/17 0801  . traZODone (DESYREL) tablet 50 mg  50 mg Oral QHS PRN Cobos, Myer Peer, MD   50 mg at 05/16/17 2109    PTA Medications: Prescriptions Prior to Admission  Medication Sig Dispense Refill Last Dose  . citalopram (CELEXA) 20 MG tablet Take 20 mg by mouth daily.   05/15/2017 at Unknown time  . ibuprofen (ADVIL,MOTRIN) 200 MG tablet Take 400-600 mg by mouth every 6 (six) hours as needed for headache.   Taking  . traZODone (DESYREL) 100 MG tablet Take 100 mg by mouth at bedtime.   Past Week at Unknown time    Treatment Modalities: Medication Management, Group therapy, Case management,  1 to 1 session with clinician, Psychoeducation, Recreational therapy.  Patient Stressors: Financial difficulties Medication change or  noncompliance Patient Strengths: Curator fund of knowledge Physical Health  Physician Treatment Plan for Primary Diagnosis: Major depressive disorder, recurrent episodes, severe without psychotic features. Long Term Goal(s): Improvement in symptoms so as ready for discharge Short Term Goals: Ability to identify changes in lifestyle to reduce recurrence of condition will improve Ability to verbalize feelings will improve Ability to demonstrate self-control will improve Ability to identify and develop effective coping behaviors will improve Compliance with prescribed medications will improve Ability to identify triggers associated with substance abuse/mental health issues will improve  Medication Management: Evaluate patient's response, side effects, and tolerance of medication regimen.  Therapeutic Interventions: 1 to 1 sessions, Unit Group sessions and Medication administration.  Evaluation of Outcomes: Not Met  Physician Treatment Plan for Secondary Diagnosis: Active Problems:   Major depressive disorder, recurrent episode ([place])  Long Term Goal(s): Improvement in symptoms so as ready for discharge  Short Term Goals: Ability to identify changes in lifestyle to reduce recurrence of condition will improve Ability to verbalize feelings will improve Ability to demonstrate self-control will improve Ability to identify and develop effective coping behaviors will improve Compliance with prescribed medications will improve Ability to identify triggers associated with substance abuse/mental health issues will improve  Medication Management: Evaluate patient's response, side effects, and tolerance of medication regimen.  Therapeutic Interventions: 1 to 1 sessions, Unit Group sessions and Medication administration.  Evaluation of Outcomes: Not Met  RN Treatment Plan for Primary Diagnosis: Major depressive disorder, recurrent episodes, severe without psychotic  features. Long Term Goal(s): Knowledge of disease and therapeutic regimen to maintain health will improve  Short Term Goals: Compliance with prescribed medications will improve  Medication Management: RN will administer medications as ordered by provider, will assess and evaluate patient's response and provide education to patient for prescribed medication. RN will report any adverse and/or side effects to prescribing provider.  Therapeutic Interventions: 1 on 1 counseling sessions, Psychoeducation, Medication administration, Evaluate responses to treatment, Monitor vital signs and CBGs as ordered, Perform/monitor CIWA, COWS, AIMS and Fall Risk screenings as ordered, Perform wound care treatments as ordered.  Evaluation of Outcomes: Not Met  LCSW Treatment Plan for Primary Diagnosis: Major depressive disorder, recurrent episodes, severe without psychotic features. Long Term Goal(s): Safe transition to appropriate next level of care at discharge, Engage patient in therapeutic group addressing interpersonal concerns. Short Term Goals: Engage patient in aftercare planning with referrals and resources, Increase ability to appropriately verbalize feelings, Increase emotional regulation, Identify triggers associated with mental health/substance abuse issues and Increase skills for wellness and recovery  Therapeutic Interventions: Assess for all discharge needs, 1 to 1 time with Social worker, Explore available resources and support systems, Assess for adequacy in community support network, Educate family and significant other(s) on suicide prevention, Complete Psychosocial Assessment, Interpersonal group therapy.  Evaluation of Outcomes: Not Met  Progress in Treatment: Attending groups: Pt is new to milieu, continuing to assess  Participating in groups: Pt is new to milieu, continuing to assess  Taking medication as prescribed: Yes, MD continues to assess for medication changes as needed Toleration  medication: Yes, no side effects reported at this time Family/Significant other contact made: No, CSW assessing for appropriate contact Patient understands diagnosis: Continuing to assess Discussing patient identified problems/goals with staff: Yes Medical problems stabilized or resolved: Yes Denies suicidal/homicidal ideation: No, pt recently admitted with SI. Issues/concerns per patient self-inventory: None Other: N/A  New problem(s) identified: None identified at this time.   New Short Term/Long Term Goal(s): None identified at this time.   Discharge Plan or Barriers: Pt will return home and follow up with an outpatient provider.  Reason for Continuation of Hospitalization:  Anxiety  Depression Medication stabilization Suicidal ideation  Estimated Length of Stay: 3-5 days  Attendees: Patient: 05/17/2017 4:31 PM  Physician: Dr. Parke Poisson 05/17/2017 4:31 PM  Nursing: Chrys Racer RN; Jinny Sanders, RN 05/17/2017 4:31 PM  RN Care Manager: 05/17/2017 4:31 PM  Social Worker: Matthew Saras, [place] 05/17/2017 4:31 PM  Recreational Therapist:  05/17/2017 4:31 PM  Other: Lindell Spar, NP 05/17/2017 4:31 PM  Other:  05/17/2017 4:31 PM  Other: 05/17/2017 4:31 PM  Scribe for Treatment Team: Georga Kaufmann, MSW,LCSWA 05/17/2017 4:31 PM

## 2017-05-17 NOTE — Progress Notes (Signed)
Patient did not attend karaoke group tonight. 

## 2017-05-17 NOTE — [facility] Counselor (Signed)
Adult Comprehensive Assessment  Patient ID: Angel [name], male   DOB: 1981/07/28, 36 y.o.   MRN: [id number]  Information Source:  Current Stressors:  Educational / Learning stressors: None Employment / Job issues: Pt has been doing odd jobs occasionally but nothing consistent Family Relationships: Family does not contact him unless they have a need Surveyor, quantity / Lack of resources (include bankruptcy): Very limited income Housing / Lack of housing: none- Pt living with a friend Physical health (include injuries & life threatening diseases): None Social relationships: Does not like ot be in crowds; very limited social support Substance abuse: Denies Bereavement / Loss: Denies  Living/Environment/Situation:  Living Arrangements: Non-relatives/Friends Living conditions (as described by patient or guardian): overall good but does not get along with one of the roommates who is a substance user How long has patient lived in current situation?: "a couple years" What is atmosphere in current home: "Okay"  Family History:  Marital status: Single Does patient have children?: No  Childhood History:  By whom was/is the patient raised?: Both parents Additional childhood history information: Patient reports having a poor and rough childhood Description of patient's relationship with caregiver when they were a child: Good with mother but father was never at home Patient's description of current relationship with people who raised him/her: No relationship with father - mother is deceased Does patient have siblings?: Yes Number of Siblings: 3 Description of patient's current relationship with siblings: Okay relationships Did patient suffer any verbal/emotional/physical/sexual abuse as a child?: No Did patient suffer from severe childhood neglect?: No Has patient ever been sexually abused/assaulted/raped as an adolescent or adult?: No Was the patient ever a victim of a crime or a disaster?:  No Witnessed domestic violence?: No Has patient been effected by domestic violence as an adult?: No  Education:  Highest grade of school patient has completed: 9th Currently a Consulting civil engineer?: No Learning disability?: No  Employment/Work Situation:   Employment situation: Working odd jobs Patient's job has been impacted by current illness: No What is the longest time patient has a held a job?: 20 years 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 Has patient ever been in the [employer]?: No Has patient ever served in Buyer, retail?: No  Financial Resources:   Financial resources: No income Does patient have a Lawyer or guardian?: No  Alcohol/Substance Abuse:   What has been your use of drugs/alcohol within the last 12 months?: Patient denies If attempted suicide, did drugs/alcohol play a role in this?: No Alcohol/Substance Abuse Treatment Hx: Denies past history Has alcohol/substance abuse ever caused legal problems?: No  Social Support System:   Conservation officer, nature Support System: Fair Museum/gallery exhibitions officer System: Brother is supportive Type of faith/religion: None How does patient's faith help to cope with current illness?: N/A  Leisure/Recreation:   Leisure and Hobbies: None  Strengths/Needs:   What things does the patient do well?: Kind hearted In what areas does patient struggle / problems for patient: Everything  Discharge Plan:   Does patient have access to transportation?: Yes Will patient be returning to same living situation after discharge?: Yes Currently receiving community mental health services: No If no, would patient like referral for services when discharged?: Yes ([facility] in [place]) Does patient have financial barriers related to discharge medications?: Yes Patient description of barriers related to discharge medications: No income or insurance.  Summary/Recommendations:  Patient is a 36 year old male with a  diagnosis of Major Depressive Disorder. Pt presented to the  hospital with thoughts of suicide and self-harming gestures. Pt reports primary trigger(s) for admission include limited income and stressful living situation. Patient will benefit from crisis stabilization, medication evaluation, group therapy and psycho education in addition to case management for discharge planning. At discharge it is recommended that Pt remain compliant with established discharge plan and continued treatment.   Angel ShanksLauren Janeal Abadi, LCSW Clinical Social Work [phone]

## 2017-05-17 NOTE — [facility] Group Notes (Signed)
[facility] LCSW Group Therapy 05/17/2017 1:15 PM  Type of Therapy: Group Therapy- Emotion Regulation  Participation Level: Pt was present for the duration of group. Did not participate in the discussion but listened attentively throughout.  Donnelly StagerLynn Snigdha Howser, MSW, LCSWA 05/17/2017 4:02 PM

## 2017-05-17 NOTE — Progress Notes (Signed)
[facility] MD Progress Note  05/17/2017 1:19 PM Angel [name]  MRN:  [id number] Subjective:  Patient reports ongoing depression, sadness . Denies medication side effects.  Objective : I have discussed case with treatment team and have met with patient. As reported by staff, he presents sad, with fair eye contact and soft speech.  Patient presents depressed, sad, with a subdued, blunted  affect .  Endorses symptoms such as anhedonia, decreased energy. Denies any current suicidal ideations and contracts for safety on unit . Does not endorse psychotic symptoms, and does not appear internally preoccupied . No disruptive or agitated behaviors on unit, has been isolative in room, but is going to some groups. Currently on Prozac, denies side effects thus far .    Principal Problem:  Major Depression Diagnosis:   Patient Active Problem List   Diagnosis Date Noted  . Major depressive disorder, recurrent episode ([place]) [F33.9] 05/16/2017  . Tobacco use disorder [F17.200] 07/17/2015  . Headache syndrome [G44.89] 07/17/2015  . Nicotine dependence [F17.200] 07/17/2015  . Generalized anxiety disorder [F41.1] 11/01/2013  . 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 Anxiety   . Depression   . Migraine    History reviewed. No pertinent surgical history. Family History:  Family History  Problem Relation Age of Onset  . Cancer Mother        brain  . Diabetes Brother   . Hypertension Brother    Social History:  History  Alcohol Use No     History  Drug Use No    Social History   Social History  . Marital status: Single    Spouse name: N/A  . Number of children: N/A  . Years of education: N/A   Social History Main Topics  . Smoking status: Current Every Day Smoker    Packs/day: 1.50    Years: 20.00    Types:  Cigarettes  . Smokeless tobacco: Never Used  . Alcohol use No  . Drug use: No  . Sexual activity: Not Asked   Other Topics Concern  . None   Social History Narrative  . None   Additional Social History:   Sleep: Fair  Appetite:  Fair  Current Medications: Current Facility-Administered Medications  Medication Dose Route Frequency Provider Last Rate Last Dose  . acetaminophen (TYLENOL) tablet 650 mg  650 mg Oral Q6H PRN Nwoko, Agnes I, NP      . alum & mag hydroxide-simeth (MAALOX/MYLANTA) 200-200-20 MG/5ML suspension 30 mL  30 mL Oral Q4H PRN Nwoko, Agnes I, NP      . FLUoxetine (PROZAC) capsule 20 mg  20 mg Oral Daily Leodis Alcocer, Myer Peer, MD   20 mg at 05/17/17 0801  . hydrOXYzine (ATARAX/VISTARIL) tablet 25 mg  25 mg Oral Q6H PRN Nwoko, Agnes I, NP      . magnesium hydroxide (MILK OF MAGNESIA) suspension 30 mL  30 mL Oral Daily PRN Nwoko, Agnes I, NP      . nicotine (NICODERM CQ - dosed in mg/24 hours) patch 21 mg  21 mg Transdermal Q0600 Lindell Spar I, NP   21 mg at 05/17/17 0801  . traZODone (DESYREL) tablet 50 mg  50 mg Oral QHS PRN Mahsa Hanser, Myer Peer, MD   50 mg at 05/16/17 2109    Lab Results:  Results for orders placed or performed during  the hospital encounter of 05/16/17 (from the past 48 hour(s))  Lipid panel     Status: Abnormal   Collection Time: 05/17/17  6:30 AM  Result Value Ref Range   Cholesterol 202 (H) 0 - 200 mg/dL   Triglycerides 165 (H) <150 mg/dL   HDL 41 >40 mg/dL   Total CHOL/HDL Ratio 4.9 RATIO   VLDL 33 0 - 40 mg/dL   LDL Cholesterol 128 (H) 0 - 99 mg/dL    Comment:        Total Cholesterol/HDL:CHD Risk Coronary Heart Disease Risk Table                     Men   Women  1/2 Average Risk   3.4   3.3  Average Risk       5.0   4.4  2 X Average Risk   9.6   7.1  3 X Average Risk  23.4   11.0        Use the calculated Patient Ratio above and the CHD Risk Table to determine the patient's CHD Risk.        ATP III CLASSIFICATION (LDL):  <100      mg/dL   Optimal  100-129  mg/dL   Near or Above                    Optimal  130-159  mg/dL   Borderline  160-189  mg/dL   High  >190     mg/dL   Very High Performed at [facility] [address], [place], [place] [address]   TSH     Status: None   Collection Time: 05/17/17  6:30 AM  Result Value Ref Range   TSH 0.939 0.350 - 4.500 uIU/mL    Comment: Performed by a 3rd Generation assay with a functional sensitivity of <=0.01 uIU/mL. Performed at [facility], [place] [address], [place], [place] [address]     Blood Alcohol level:  Lab Results  Component Value Date   ETH <5 05/16/2017   ETH 5 82/95/6213    Metabolic Disorder Labs: No results found for: HGBA1C, MPG No results found for: PROLACTIN Lab Results  Component Value Date   CHOL 202 (H) 05/17/2017   TRIG 165 (H) 05/17/2017   HDL 41 05/17/2017   CHOLHDL 4.9 05/17/2017   VLDL 33 05/17/2017   LDLCALC 128 (H) 05/17/2017    Physical Findings: AIMS: Facial and Oral Movements Muscles of Facial Expression: None, normal Lips and Perioral Area: None, normal Jaw: None, normal Tongue: None, normal,Extremity Movements Upper (arms, wrists, hands, fingers): None, normal Lower (legs, knees, ankles, toes): None, normal, Trunk Movements Neck, shoulders, hips: None, normal, Overall Severity Severity of abnormal movements (highest score from questions above): None, normal Incapacitation due to abnormal movements: None, normal Patient's awareness of abnormal movements (rate only patient's report): No Awareness, Dental Status Current problems with teeth and/or dentures?: No Does patient usually wear dentures?: No  CIWA:    COWS:     Musculoskeletal: Strength & Muscle Tone: within normal limits Gait & Station: normal Patient leans: N/A  Psychiatric Specialty Exam: Physical Exam  ROS no chest pain, no dyspnea, no vomiting   Blood pressure 112/73, pulse 93, temperature 98.7 F (37.1 C), temperature  source Oral, resp. rate 16, height 5' 6.75" (1.695 m), weight 82.6 kg (182 lb).Body mass index is 28.72 kg/m.  General Appearance: Fairly Groomed  Eye Contact:  Fair  Speech:  Normal Rate  Volume:  Decreased  Mood:  Depressed  Affect:  constricted, sad  Thought Process:  Linear and Descriptions of Associations: Intact  Orientation:  Other:  fully alert and attentive  Thought Content:  no hallucinations, no delusions, not internally preoccupied   Suicidal Thoughts:  No denies suicidal or self injurious ideations at this time, contracts for safety on unit , denies violent or homicidal ideations  Homicidal Thoughts:  No  Memory:  recent and remote grossly intact   Judgement:  Fair  Insight:  Fair  Psychomotor Activity:  Decreased  Concentration:  Concentration: Good and Attention Span: Good  Recall:  Good  Fund of Knowledge:  Good  Language:  Fair  Akathisia:  Negative  Handed:  Left  AIMS (if indicated):     Assets:  Desire for Improvement Resilience  ADL's:  fair  Cognition:  WNL  Sleep:  Number of Hours: 6.75   Assessment - patient remains depressed, constricted in affect, sad. Denies suicidal ideations and contracts for safety on unit at this time. He is somewhat less isolative today, more visible on unit. Currently on Prozac, which he is tolerating well thus far. Denies medication side effects.   Treatment Plan Summary: Daily contact with patient to assess and evaluate symptoms and progress in treatment, Medication management, Plan inpatient management and medications as below Encourage group and milieu participation, to work on coping skills and symptom reduction Continue Prozac 20 mgrs QDAY for depression Start Abilify 2 mgrs QDAY as antidepressant augmentation Continue Vistaril 25 mgrs Q 6 hours PRN for anxiety as needed  Continue Trazodone 50 mgrs QHS PRN for insomnia  Continue Nicotine transdermal for nicotine cravings Treatment team working on disposition planning  options Jenne Campus, MD 05/17/2017, 1:19 PM

## 2017-05-17 NOTE — Progress Notes (Signed)
D) Pt. Affect flat.  Pt. Denies pain.  Currently denies SI and states "I'm not that kind of person" when asked about HI. Denies A/V hallucinations.Reports low energy, and poor concentration and rated anxiety 6/10 and depression 4/10  (10 worst) per self-inventory.  Noted sitting alone, mood appears very depressed, not interacting with others.   A) Abilify 2mg  started.  Medication education reviewed. R ) Pt. Receptive and offers no c/o at this time.

## 2017-05-18 LAB — HEMOGLOBIN A1C
HEMOGLOBIN A1C: 5.3 % (ref 4.8–5.6)
MEAN PLASMA GLUCOSE: 105 mg/dL

## 2017-05-18 LAB — PROLACTIN: Prolactin: 17.3 ng/mL — ABNORMAL HIGH (ref 4.0–15.2)

## 2017-05-18 MED ORDER — ARIPIPRAZOLE 5 MG PO TABS
5.0000 mg | ORAL_TABLET | Freq: Every day | ORAL | Status: DC
  Administered 2017-05-19 – 2017-05-23 (×5): 5 mg via ORAL
  Filled 2017-05-18: qty 7
  Filled 2017-05-18 (×6): qty 1

## 2017-05-18 NOTE — Progress Notes (Signed)
Recreation Therapy Notes  Date: 05/18/17 Time: 0930 Location: [address] Dayroom  Group Topic: Stress Management  Goal Area(s) Addresses:  Patient will verbalize importance of using healthy stress management.  Patient will identify positive emotions associated with healthy stress management.   Intervention: Stress Management  Activity : Progressive Muscle Relaxation.  LRT introduced the stress management technique of progressive muscle relaxation.  LRT read [name] script to guide patients through the process of pmr so they could get the full scope of the technique.  Patients were to follow along as the script was read to fully engage in technique.   Education: Stress Management, Discharge Planning.   Education Outcome: Acknowledges edcuation/In group clarification offered/Needs additional education  Clinical Observations/Feedback: Pt did not attend group.   Angel [name], LRT/CTRS        Angel [name] 05/18/2017 11:28 AM 

## 2017-05-18 NOTE — Progress Notes (Signed)
Data. Patient denies SI/HI/AVH. Patient interacting minimally with staff and other patients. Will spend time in the dayroom, but stays by himself, coloring. Will interact with others if they initiate the interaction. Affect is very flat and does not brighten. Patient has been noted with his head in his hands and his head down frequently on the unit. Patient is disheveled and has a strong body odor. Patient was encouraged to shower, and agreed to do so today.  Action. Emotional support and encouragement offered. Education provided on medication, indications and side effect. Q 15 minute checks done for safety. Response. Safety on the unit maintained through 15 minute checks.  Medications taken as prescribed. Attended groups. Remained calm and appropriate through out shift.

## 2017-05-18 NOTE — Progress Notes (Signed)
Nursing Progress Note: 7p-7a D: Pt currently presents with a pleasant/sad affect and behavior. Pt states "I had a good day. Learned good things in group and focused on me today." Interacting appropriately with milieu. Pt reports bad sleep with current medication regimen.   A: Pt provided with medications per providers orders. Pt's labs and vitals were monitored throughout the night. Pt supported emotionally and encouraged to express concerns and questions. Pt educated on medications.  R: Pt's safety ensured with 15 minute and environmental checks. Pt currently denies SI/HI/Self Harm and AVH. Pt verbally contracts to seek staff if SI/HI or A/VH occurs and to consult with staff before acting on any harmful thoughts. Will continue to monitor.

## 2017-05-18 NOTE — Progress Notes (Signed)
[facility] MD Progress Note  05/18/2017 1:09 PM Angel [name]  MRN:  [id number] Subjective:  He reports ongoing depression, sadness. At this time denies suicidal ideations. He is tolerating medications well at present .  Objective : I have discussed case with treatment team and have met with patient. Patient continues to present depressed, although affect is less constricted, still exhibiting a sad affect, but today it is more reactive and  he smiles at times appropriately. Grooming is fair and presents malodorous . He denies suicidal ideations at this time.  Thus far is tolerating medications well, denies side effects. Remains isolative, spending most time in room, with limited group participation and little interaction with peers. No disruptive or agitated behaviors, cooperative and polite on approach. At this time on Abilify and on Prozac- both these medications are new for patient. Thus far tolerating well .     Principal Problem:  Major Depression Diagnosis:   Patient Active Problem List   Diagnosis Date Noted  . Major depressive disorder, recurrent episode ([place]) [F33.9] 05/16/2017  . Tobacco use disorder [F17.200] 07/17/2015  . Headache syndrome [G44.89] 07/17/2015  . Nicotine dependence [F17.200] 07/17/2015  . Generalized anxiety disorder [F41.1] 11/01/2013  . 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 Anxiety   . Depression   . Migraine    History reviewed. No pertinent surgical history. Family History:  Family History  Problem Relation Age of Onset  . Cancer Mother        brain  . Diabetes Brother   . Hypertension Brother    Social History:  History  Alcohol Use No     History  Drug Use No    Social History   Social History  . Marital status: Single    Spouse name: N/A  . Number of  children: N/A  . Years of education: N/A   Social History Main Topics  . Smoking status: Current Every Day Smoker    Packs/day: 1.50    Years: 20.00    Types: Cigarettes  . Smokeless tobacco: Never Used  . Alcohol use No  . Drug use: No  . Sexual activity: Not Asked   Other Topics Concern  . None   Social History Narrative  . None   Additional Social History:   Sleep: Good  Appetite:  Fair  Current Medications: Current Facility-Administered Medications  Medication Dose Route Frequency Provider Last Rate Last Dose  . acetaminophen (TYLENOL) tablet 650 mg  650 mg Oral Q6H PRN Nwoko, Agnes I, NP      . alum & mag hydroxide-simeth (MAALOX/MYLANTA) 200-200-20 MG/5ML suspension 30 mL  30 mL Oral Q4H PRN Nwoko, Agnes I, NP      . ARIPiprazole (ABILIFY) tablet 2 mg  2 mg Oral Daily Jalisa Sacco, Myer Peer, MD   2 mg at 05/18/17 7893  . FLUoxetine (PROZAC) capsule 20 mg  20 mg Oral Daily Stephanieann Popescu, Myer Peer, MD   20 mg at 05/18/17 0823  . hydrOXYzine (ATARAX/VISTARIL) tablet 25 mg  25 mg Oral Q6H PRN Lindell Spar I, NP   25 mg at 05/18/17 8101  . magnesium hydroxide (MILK OF MAGNESIA) suspension 30 mL  30 mL Oral Daily PRN Nwoko, Agnes I, NP      . nicotine (NICODERM CQ - dosed in mg/24 hours) patch 21 mg  21 mg Transdermal  U2025 Lindell Spar I, NP   21 mg at 05/18/17 0825  . traZODone (DESYREL) tablet 50 mg  50 mg Oral QHS PRN Paeton Studer, Myer Peer, MD   50 mg at 05/16/17 2109    Lab Results:  Results for orders placed or performed during the hospital encounter of 05/16/17 (from the past 48 hour(s))  Hemoglobin A1c     Status: None   Collection Time: 05/17/17  6:30 AM  Result Value Ref Range   Hgb A1c MFr Bld 5.3 4.8 - 5.6 %    Comment: (NOTE)         Pre-diabetes: 5.7 - 6.4         Diabetes: >6.4         Glycemic control for adults with diabetes: <7.0    Mean Plasma Glucose 105 mg/dL    Comment: (NOTE) Performed At: [facility] [place], [place]  [id number] Lindon Romp MD [id number] Performed at [facility], [place] [address] [place], [place] [address]   Lipid panel     Status: Abnormal   Collection Time: 05/17/17  6:30 AM  Result Value Ref Range   Cholesterol 202 (H) 0 - 200 mg/dL   Triglycerides 165 (H) <150 mg/dL   HDL 41 >40 mg/dL   Total CHOL/HDL Ratio 4.9 RATIO   VLDL 33 0 - 40 mg/dL   LDL Cholesterol 128 (H) 0 - 99 mg/dL    Comment:        Total Cholesterol/HDL:CHD Risk Coronary Heart Disease Risk Table                     Men   Women  1/2 Average Risk   3.4   3.3  Average Risk       5.0   4.4  2 X Average Risk   9.6   7.1  3 X Average Risk  23.4   11.0        Use the calculated Patient Ratio above and the CHD Risk Table to determine the patient's CHD Risk.        ATP III CLASSIFICATION (LDL):  <100     mg/dL   Optimal  100-129  mg/dL   Near or Above                    Optimal  130-159  mg/dL   Borderline  160-189  mg/dL   High  >190     mg/dL   Very High Performed at [place] [address], [place], [place] [address]   Prolactin     Status: Abnormal   Collection Time: 05/17/17  6:30 AM  Result Value Ref Range   Prolactin 17.3 (H) 4.0 - 15.2 ng/mL    Comment: (NOTE) Performed At: [facility] [place], [place] [id number] Lindon Romp MD [id number] Performed at [facility], [place] [address], [place], [place] [address]   TSH     Status: None   Collection Time: 05/17/17  6:30 AM  Result Value Ref Range   TSH 0.939 0.350 - 4.500 uIU/mL    Comment: Performed by a 3rd Generation assay with a functional sensitivity of <=0.01 uIU/mL. Performed at [facility], [place] [address], [place], [place] [address]     Blood Alcohol level:  Lab Results  Component Value Date   [facility] <5 05/16/2017   ETH 5 17/51/0258    Metabolic Disorder  Labs: Lab Results  Component Value Date   HGBA1C 5.3 05/17/2017   MPG  105 05/17/2017   Lab Results  Component Value Date   PROLACTIN 17.3 (H) 05/17/2017   Lab Results  Component Value Date   CHOL 202 (H) 05/17/2017   TRIG 165 (H) 05/17/2017   HDL 41 05/17/2017   CHOLHDL 4.9 05/17/2017   VLDL 33 05/17/2017   LDLCALC 128 (H) 05/17/2017    Physical Findings: AIMS: Facial and Oral Movements Muscles of Facial Expression: None, normal Lips and Perioral Area: None, normal Jaw: None, normal Tongue: None, normal,Extremity Movements Upper (arms, wrists, hands, fingers): None, normal Lower (legs, knees, ankles, toes): None, normal, Trunk Movements Neck, shoulders, hips: None, normal, Overall Severity Severity of abnormal movements (highest score from questions above): None, normal Incapacitation due to abnormal movements: None, normal Patient's awareness of abnormal movements (rate only patient's report): No Awareness, Dental Status Current problems with teeth and/or dentures?: No Does patient usually wear dentures?: No  CIWA:    COWS:     Musculoskeletal: Strength & Muscle Tone: within normal limits Gait & Station: normal Patient leans: N/A  Psychiatric Specialty Exam: Physical Exam  ROS  Denies headache, no chest pain, no dyspnea   Blood pressure 108/84, pulse 84, temperature 98 F (36.7 C), resp. rate 16, height 5' 6.75" (1.695 m), weight 82.6 kg (182 lb).Body mass index is 28.72 kg/m.  General Appearance: Disheveled  Eye Contact:  Fair, improved compared to admission  Speech:  Normal Rate  Volume:  Decreased  Mood:  remains depressed   Affect:  although sad, constricted, is more reactive now, and smiles at times appropriately  Thought Process:  Goal Directed and Descriptions of Associations: Circumstantial  Orientation:  Other:  fully alert and attentive  Thought Content:  denies hallucinations, no delusions, does not appear internally preoccupied   Suicidal Thoughts:  No  Denies current suicidal plan or intention and contracts for safety    Homicidal Thoughts:  No  Memory:  recent and remote grossly intact   Judgement:  Fair  Insight:  Fair  Psychomotor Activity:  Decreased  Concentration:  Concentration: Good and Attention Span: Good  Recall:  Good  Fund of Knowledge:  Good  Language:  Fair  Akathisia:  Negative  Handed:  Left  AIMS (if indicated):     Assets:  Desire for Improvement Resilience  ADL's:  fair  Cognition:  WNL  Sleep:  Number of Hours: 6.5   Assessment - patient continues to present sad, depressed, isolative.Poor grooming . Affect does seem to be improving / gradually becoming more reactive , as well as presenting with improving eye contact . Denies SI at this time. Thus far tolerating Prozac, Abilify well .   Treatment Plan Summary: Treatment plan reviewed as below today 6/22  Daily contact with patient to assess and evaluate symptoms and progress in treatment, Medication management, Plan inpatient management and medications as below Encourage group and milieu participation, to work on coping skills and symptom reduction Continue Prozac 20 mgrs QDAY for depression Increase Abilify to 5  mgrs QDAY as antidepressant augmentation Continue Vistaril 25 mgrs Q 6 hours PRN for anxiety as needed  Continue Trazodone 50 mgrs QHS PRN for insomnia  Continue Nicotine transdermal for nicotine cravings Treatment team working on disposition planning options Jenne Campus, MD 05/18/2017, 1:09 PM   Patient ID: Kathlynn Grate, male   DOB: 10-25-1981, 36 y.o.   MRN: [id number]

## 2017-05-18 NOTE — Plan of Care (Signed)
Problem: Self-Concept: Goal: Ability to disclose and discuss suicidal ideas will improve Outcome: Progressing Patient has been able to discuss his feelings of depression, this shift. He denies SI this shift.

## 2017-05-18 NOTE — [facility] Group Notes (Signed)
[facility] LCSW Group Therapy 05/18/2017 1:15pm  Type of Therapy: Group Therapy- Feelings Around Relapse and Recovery  Participation Level: Active   Participation Quality:  Appropriate  Affect:  Appropriate  Cognitive: Alert and Oriented   Insight:  Developing   Engagement in Therapy: Developing/Improving and Engaged   Modes of Intervention: Clarification, Confrontation, Discussion, Education, Exploration, Limit-setting, Orientation, Problem-solving, Rapport Building, Dance movement psychotherapisteality Testing, Socialization and Support  Summary of Progress/Problems: The topic for today was feelings about relapse. The group discussed what relapse prevention is to them and identified triggers that they are on the path to relapse. Members also processed their feeling towards relapse and were able to relate to common experiences. Group also discussed coping skills that can be used for relapse prevention. Pt reports that he has a hard time with preventing relapse because he feels like he makes the same mistakes over and over. Pt presented with a flat and depressed affect and spent most of the group bent over in his chair with his head resting in his hands. CSW offered pt several suggestions and attempted to challenge pt's thoughts but pt continually had a reason for why each suggestion would not work. Pt presents as hopeless.   Therapeutic Modalities:   Cognitive Behavioral Therapy Solution-Focused Therapy Assertiveness Training Relapse Prevention Therapy   Donnelly StagerLynn Cristal Qadir, MSW, Theresia MajorsLCSWA [phone] 05/18/2017 2:53 PM

## 2017-05-19 ENCOUNTER — Encounter (HOSPITAL_COMMUNITY): Payer: Self-pay | Admitting: Behavioral Health

## 2017-05-19 MED ORDER — TRAZODONE HCL 100 MG PO TABS
100.0000 mg | ORAL_TABLET | Freq: Every evening | ORAL | Status: DC | PRN
  Administered 2017-05-20 – 2017-05-22 (×3): 100 mg via ORAL
  Filled 2017-05-19 (×2): qty 1
  Filled 2017-05-19: qty 7
  Filled 2017-05-19: qty 1

## 2017-05-19 NOTE — Plan of Care (Signed)
Problem: Self-Concept: Goal: Ability to disclose and discuss suicidal ideas will improve Outcome: Progressing Patient is becoming more open with discussing his feelings through out the shift.

## 2017-05-19 NOTE — Progress Notes (Signed)
Adult Psychoeducational Group Note  Date:  05/19/2017 Time:  10:00 am  Group Topic/Focus:  Identifying Needs:   The focus of this group is to help patients identify their personal needs that have been historically problematic and identify healthy behaviors to address their needs.  Participation Level:  Active  Participation Quality:  Appropriate  Affect:  Appropriate  Cognitive:  Appropriate  Insight: Appropriate  Engagement in Group:  Improving  Modes of Intervention:  Discussion and Education  Additional Comments:    Lydiah Pong L 05/19/2017, 10:00 am  

## 2017-05-19 NOTE — Progress Notes (Signed)
[facility]  05/19/2017 10:58 AM Angel [name]  MRN:  [id number]  Subjective: " This is about my fourth day here. Things are well just a little tired. Sleeping medicine doesn't seem to be helping a lot."  Objective: Face to face evaluation completed and chart reviewed. During this evaluation, patient is alert and oriented x3, calm, and cooperative. His mood appears depressed and affected congruent although he is pleasant on approach. Patient continues to endorse a depressed/sad mood rating depression as 3/10 with 0 being none and 10 being the worst although he endorses slight improvement. He denies suicidal ideations at this time. He denies homicidal ideas or psychosis and there are no signs of delusions, paranoia, or other psychotic process. He endorses good appetite yet reports poor sleeping pattern with the administration  of Trazodone. There are disruptive behaviors reported or observed. He reports his goal today is to attend and participate in unit activities. It appears that patient is isolative with limited group participation although he did attend morning group. Patient is able to contract for safety on the unit at this time.     Principal Problem:  Major Depression Diagnosis:   Patient Active Problem List   Diagnosis Date Noted  . Major depressive disorder, recurrent episode ([facility]) [F33.9] 05/16/2017  . Tobacco use disorder [F17.200] 07/17/2015  . Headache syndrome [G44.89] 07/17/2015  . Nicotine dependence [F17.200] 07/17/2015  . Generalized anxiety disorder [F41.1] 11/01/2013  . 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 Anxiety   . Depression   . Migraine    History reviewed. No pertinent surgical history. Family History:  Family History  Problem Relation Age of Onset  . Cancer Mother         brain  . Diabetes Brother   . Hypertension Brother    Social History:  History  Alcohol Use No     History  Drug Use No    Social History   Social History  . Marital status: Single    Spouse name: N/A  . Number of children: N/A  . Years of education: N/A   Social History Main Topics  . Smoking status: Current Every Day Smoker    Packs/day: 1.50    Years: 20.00    Types: Cigarettes  . Smokeless tobacco: Never Used  . Alcohol use No  . Drug use: No  . Sexual activity: Not Asked   Other Topics Concern  . None   Social History Narrative  . None   Additional Social History:   Sleep: Poor  Appetite:  Fair  Current Medications: Current Facility-Administered Medications  Medication Dose Route Frequency Provider Last Rate Last Dose  . acetaminophen (TYLENOL) tablet 650 mg  650 mg Oral Q6H PRN Nwoko, Agnes I, NP      . alum & mag hydroxide-simeth (MAALOX/MYLANTA) 200-200-20 MG/5ML suspension 30 mL  30 mL Oral Q4H PRN Nwoko, Agnes I, NP      . ARIPiprazole (ABILIFY) tablet 5 mg  5 mg Oral Daily Cobos, Rockey SituFernando A, MD   5 mg at 05/19/17 0739  . FLUoxetine (PROZAC) capsule 20 mg  20 mg Oral Daily Cobos, Rockey SituFernando A, MD   20 mg at 05/19/17 0738  . hydrOXYzine (ATARAX/VISTARIL) tablet 25 mg  25 mg Oral Q6H PRN Armandina StammerNwoko, Agnes I, NP   25 mg at 05/18/17 2117  .  magnesium hydroxide (MILK OF MAGNESIA) suspension 30 mL  30 mL Oral Daily PRN Nwoko, Agnes I, NP      . nicotine (NICODERM CQ - dosed in mg/24 hours) patch 21 mg  21 mg Transdermal Q0600 Armandina Stammer I, NP   21 mg at 05/19/17 0739  . traZODone (DESYREL) tablet 50 mg  50 mg Oral QHS PRN Cobos, Rockey Situ, MD   50 mg at 05/18/17 2117    Lab Results:  No results found for this or any previous visit (from the past 48 hour(s)).  Blood Alcohol level:  Lab Results  Component Value Date   ETH <5 05/16/2017   ETH 5 12/27/2014    Metabolic Disorder Labs: Lab Results  Component Value Date   HGBA1C 5.3 05/17/2017   MPG  105 05/17/2017   Lab Results  Component Value Date   PROLACTIN 17.3 (H) 05/17/2017   Lab Results  Component Value Date   CHOL 202 (H) 05/17/2017   TRIG 165 (H) 05/17/2017   HDL 41 05/17/2017   CHOLHDL 4.9 05/17/2017   VLDL 33 05/17/2017   LDLCALC 128 (H) 05/17/2017    Physical Findings: AIMS: Facial and Oral Movements Muscles of Facial Expression: None, normal Lips and Perioral Area: None, normal Jaw: None, normal Tongue: None, normal,Extremity Movements Upper (arms, wrists, hands, fingers): None, normal Lower (legs, knees, ankles, toes): None, normal, Trunk Movements Neck, shoulders, hips: None, normal, Overall Severity Severity of abnormal movements (highest score from questions above): None, normal Incapacitation due to abnormal movements: None, normal Patient's awareness of abnormal movements (rate only patient's report): No Awareness, Dental Status Current problems with teeth and/or dentures?: No Does patient usually wear dentures?: No  CIWA:    COWS:     Musculoskeletal: Strength & Muscle Tone: within normal limits Gait & Station: normal Patient leans: N/A  Psychiatric Specialty Exam: Physical Exam  Nursing note and vitals reviewed. Constitutional: He is oriented to person, place, and time.  Neurological: He is alert and oriented to person, place, and time.    Review of Systems  Psychiatric/Behavioral: Positive for depression. Negative for hallucinations, memory loss, substance abuse and suicidal ideas. The patient has insomnia. The patient is not nervous/anxious.   All other systems reviewed and are negative.   Denies headache, no chest pain, no dyspnea   Blood pressure 114/85, pulse 94, temperature 98.3 F (36.8 C), resp. rate 16, height 5' 6.75" (1.695 m), weight 182 lb (82.6 kg).Body mass index is 28.72 kg/m.  General Appearance: Disheveled  Eye Contact:  Fair, improved compared to admission  Speech:  Normal Rate  Volume:  Decreased  Mood:  remains  depressed   Affect:  Depressed brightens on approach   Thought Process:  Goal Directed and Descriptions of Associations: Circumstantial  Orientation:  Other:  fully alert and attentive  Thought Content:  denies hallucinations, no delusions, does not appear internally preoccupied   Suicidal Thoughts:  No  Denies current suicidal plan or intention and contracts for safety   Homicidal Thoughts:  No  Memory:  recent and remote grossly intact   Judgement:  Fair  Insight:  Fair  Psychomotor Activity:  Decreased  Concentration:  Concentration: Good and Attention Span: Good  Recall:  Good  Fund of Knowledge:  Good  Language:  Fair  Akathisia:  Negative  Handed:  Left  AIMS (if indicated):     Assets:  Desire for Improvement Resilience  ADL's:  fair  Cognition:  WNL  Sleep:  Number of Hours:  6.75   Assessment - patient continues to present with a depressed mood. Because he has remained isolative with little participation in unit milieu, he reports his goal for today is to be more participative. Denies SI at this time. To reduce symptoms to baseline and improve overall level of functioning will continue the following treatment plan with adjustments as noted below;   Treatment Plan Summary: Treatment plan reviewed as below today 6/22  Daily contact with patient to assess and evaluate symptoms and progress in treatment, Medication management, Plan inpatient management and medications as below Encourage group and milieu participation, to work on coping skills and symptom reduction Continue Prozac 20 mgrs QDAY for depression Increase Abilify to 5  mgrs QDAY as antidepressant augmentation Continue Vistaril 25 mgrs Q 6 hours PRN for anxiety as needed  Increase Trazodone to 100 mgrs QHS PRN for insomnia  Continue Nicotine transdermal for nicotine cravings Treatment team working on disposition planning options Angel Magnuson, NP 05/19/2017, 10:58 AM   Patient ID: Angel [name], male   DOB:  Mar 28, 1981, 36 y.o.   MRN: [id number]

## 2017-05-19 NOTE — Progress Notes (Signed)
Data. Patient denies SI/HI/AVH. Patient has spent time in the day room coloring this shift, sitting by himself. He has also spent a large amount of time in his room napping. Affect  brighter this shift. He will smile with interactions. He reported, "I am starting to feel better, every day." Action. Emotional support and encouragement offered. Education provided on medication, indications and side effect. Q 15 minute checks done for safety. Response. Safety on the unit maintained through 15 minute checks.  Medications taken as prescribed. Attended groups. Remained calm and appropriate through out shift.

## 2017-05-19 NOTE — [facility] Group Notes (Signed)
Adult Therapy Group Note (Clinical Social Work)  Date:  05/19/2017  Time:  11:00AM-12:00PM  Group Topic/Focus:  HEALTHY COPING SKILLS  Today's group focused on identifying healthy coping skills already in use by each patient, as well as healthy coping skills they would like to learn.  There was much sharing, support, psychoeducation, and encouragement provided.  Participation Level:  Active  Participation Quality:  Attentive  Affect:  Blunted  Cognitive:  Appropriate  Insight: Limited  Engagement in Group:  Limited  Modes of Intervention:  Discussion, Support and Processing  Additional Comments:  The patient shared that healthy coping already used is coloring, and he would like to learn more coping skills to use when he is depressed, was not specific about what.  Carloyn JaegerMareida J Grossman-Orr 04/28/2017, 1:28 PM

## 2017-05-20 ENCOUNTER — Encounter (HOSPITAL_COMMUNITY): Payer: Self-pay | Admitting: Behavioral Health

## 2017-05-20 DIAGNOSIS — Z79899 Other long term (current) drug therapy: Secondary | ICD-10-CM

## 2017-05-20 DIAGNOSIS — F419 Anxiety disorder, unspecified: Secondary | ICD-10-CM

## 2017-05-20 DIAGNOSIS — F17210 Nicotine dependence, cigarettes, uncomplicated: Secondary | ICD-10-CM

## 2017-05-20 DIAGNOSIS — G4700 Insomnia, unspecified: Secondary | ICD-10-CM

## 2017-05-20 DIAGNOSIS — F329 Major depressive disorder, single episode, unspecified: Secondary | ICD-10-CM

## 2017-05-20 NOTE — Progress Notes (Signed)
Writer spoke with patient 1:1 and reports having had a good day but was bored with not a whole lot to do around here. He reported that he was tired of coloring so writer had some word searches printed for him and other patients. He reports that his brother and family are glad that he came to get help. He is hopeful to discharge soon. Support given and safety maintained on unit with 15 min checks.

## 2017-05-20 NOTE — [facility] Group Notes (Signed)
[facility] Group Notes:  (Nursing/MHT/Case Management/Adjunct)  Date:  05/20/2017  Time:  3:20 PM  Type of Therapy:  Nurse Education  Participation Level:  Active  Participation Quality:  Appropriate  Affect:  Appropriate  Cognitive:  Appropriate  Insight:  Lacking  Engagement in Group:  Engaged  Modes of Intervention:  Activity  Summary of Progress/Problems:   Each patient was tasked with witting positive attributes about the other patients. Discussion followed about how positive statements can help self esteem.  Almira Barenny G Gill Delrossi 05/20/2017, 3:20 PM

## 2017-05-20 NOTE — Progress Notes (Signed)
Writer observed patient lying in bed asleep with eyes closed and respirations even, no distress noted. Writer will continue to monitor patient and speak with him later on in the shift. Safety maintained on uit with 15 min checks.

## 2017-05-20 NOTE — Progress Notes (Signed)
Data. Patient denies SI/HI/AVH. Patient continues to sit in the day room, by himself. He reports, "There is just so much coloring I can do. I'm really board." His affect is brighter today and does brighten faster than previously. Has spent time this afternoon napping in his room.   Action. Emotional support and encouragement offered. Education provided on medication, indications and side effect. Q 15 minute checks done for safety. Response. Safety on the unit maintained through 15 minute checks.  Medications taken as prescribed. Attended groups. Remained calm and appropriate through out shift.

## 2017-05-20 NOTE — Progress Notes (Signed)
[facility] MD Progress Note  05/20/2017 9:08 AM Angel [name]  MRN:  [id number]  Subjective: " I slept a lot yesterday because I was bored."  Objective: Face to face evaluation completed and chart reviewed. During this evaluation, patient is alert and oriented x3, calm, and cooperative. Patient continues to present with a depressed/sad mood. His affect appears to be improving as he is noted smiling at times during the evaluation. He currently rates depression as 2/10 with 0 being none and 10 being the worst. Endorses a height ned level of anxiety and rates anxiety as 6/10 based of the above noted scale. He endorses he rested well last night and denies alterations or difficulties in appetite. He continues to participate minimally in therapeutic milieu and per his report, missed two group sessions yesterday. Patient endorsed that he was bored and tired because of his depression. He denies suicidal ideations at this time. He denies homicidal ideas or psychosis and there are no signs of delusions, paranoia, or other psychotic process.There are disruptive behaviors reported or observed. Appears to be tolerating medications well.  At this time, patient is able to contract for safety on the unit at this time.     Principal Problem:  Major Depression Diagnosis:   Patient Active Problem List   Diagnosis Date Noted  . Major depressive disorder, recurrent episode (HCC) [F33.9] 05/16/2017  . Tobacco use disorder [F17.200] 07/17/2015  . Headache syndrome [G44.89] 07/17/2015  . Nicotine dependence [F17.200] 07/17/2015  . Generalized anxiety disorder [F41.1] 11/01/2013  . 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 Anxiety   . Depression   . Migraine    History reviewed. No pertinent surgical history. Family History:  Family History   Problem Relation Age of Onset  . Cancer Mother        brain  . Diabetes Brother   . Hypertension Brother    Social History:  History  Alcohol Use No     History  Drug Use No    Social History   Social History  . Marital status: Single    Spouse name: N/A  . Number of children: N/A  . Years of education: N/A   Social History Main Topics  . Smoking status: Current Every Day Smoker    Packs/day: 1.50    Years: 20.00    Types: Cigarettes  . Smokeless tobacco: Never Used  . Alcohol use No  . Drug use: No  . Sexual activity: Not Asked   Other Topics Concern  . None   Social History Narrative  . None   Additional Social History:   Sleep: improved  Appetite:  Fair  Current Medications: Current Facility-Administered Medications  Medication Dose Route Frequency Provider Last Rate Last Dose  . acetaminophen (TYLENOL) tablet 650 mg  650 mg Oral Q6H PRN Armandina Stammer I, NP   650 mg at 05/20/17 0747  . alum & mag hydroxide-simeth (MAALOX/MYLANTA) 200-200-20 MG/5ML suspension 30 mL  30 mL Oral Q4H PRN Nwoko, Agnes I, NP      . ARIPiprazole (ABILIFY) tablet 5 mg  5 mg Oral Daily Cobos, Rockey Situ, MD   5 mg at 05/20/17 0745  . FLUoxetine (PROZAC) capsule 20 mg  20 mg Oral Daily Cobos, Rockey Situ, MD   20 mg at 05/20/17 0745  . hydrOXYzine (ATARAX/VISTARIL) tablet 25 mg  25 mg Oral Q6H PRN Armandina Stammer I, NP   25 mg at 05/18/17 2117  . magnesium hydroxide (MILK OF MAGNESIA) suspension 30 mL  30 mL Oral Daily PRN Nwoko, Agnes I, NP      . nicotine (NICODERM CQ - dosed in mg/24 hours) patch 21 mg  21 mg Transdermal Q0600 Armandina Stammer I, NP   21 mg at 05/20/17 0745  . traZODone (DESYREL) tablet 100 mg  100 mg Oral QHS PRN Denzil Magnuson, NP        Lab Results:  No results found for this or any previous visit (from the past 48 hour(s)).  Blood Alcohol level:  Lab Results  Component Value Date   ETH <5 05/16/2017   ETH 5 12/27/2014    Metabolic Disorder Labs: Lab  Results  Component Value Date   HGBA1C 5.3 05/17/2017   MPG 105 05/17/2017   Lab Results  Component Value Date   PROLACTIN 17.3 (H) 05/17/2017   Lab Results  Component Value Date   CHOL 202 (H) 05/17/2017   TRIG 165 (H) 05/17/2017   HDL 41 05/17/2017   CHOLHDL 4.9 05/17/2017   VLDL 33 05/17/2017   LDLCALC 128 (H) 05/17/2017    Physical Findings: AIMS: Facial and Oral Movements Muscles of Facial Expression: None, normal Lips and Perioral Area: None, normal Jaw: None, normal Tongue: None, normal,Extremity Movements Upper (arms, wrists, hands, fingers): None, normal Lower (legs, knees, ankles, toes): None, normal, Trunk Movements Neck, shoulders, hips: None, normal, Overall Severity Severity of abnormal movements (highest score from questions above): None, normal Incapacitation due to abnormal movements: None, normal Patient's awareness of abnormal movements (rate only patient's report): No Awareness, Dental Status Current problems with teeth and/or dentures?: No Does patient usually wear dentures?: No  CIWA:    COWS:     Musculoskeletal: Strength & Muscle Tone: within normal limits Gait & Station: normal Patient leans: N/A  Psychiatric Specialty Exam: Physical Exam  Nursing note and vitals reviewed. Constitutional: He is oriented to person, place, and time.  Neurological: He is alert and oriented to person, place, and time.    Review of Systems  Psychiatric/Behavioral: Positive for depression. Negative for hallucinations, memory loss, substance abuse and suicidal ideas. The patient has insomnia. The patient is not nervous/anxious.   All other systems reviewed and are negative.   Denies headache, no chest pain, no dyspnea   Blood pressure 115/79, pulse 91, temperature 98.4 F (36.9 C), temperature source Oral, resp. rate 18, height 5' 6.75" (1.695 m), weight 182 lb (82.6 kg).Body mass index is 28.72 kg/m.  General Appearance: Fairly Groomed  Eye Contact:  Fair   Speech:  Normal Rate  Volume:  Decreased  Mood:  remains depressed   Affect:  Depressed brightens on approach   Thought Process:  Goal Directed and Descriptions of Associations: Circumstantial  Orientation:  Other:  fully alert and attentive  Thought Content:  denies hallucinations, no delusions, does not appear internally preoccupied   Suicidal Thoughts:  No  Denies current suicidal plan or intention and contracts for safety   Homicidal Thoughts:  No  Memory:  recent and remote grossly intact   Judgement:  Fair  Insight:  Fair  Psychomotor Activity:  Decreased  Concentration:  Concentration: Good and Attention Span: Good  Recall:  Good  Fund of Knowledge:  Good  Language:  Fair  Akathisia:  Negative  Handed:  Left  AIMS (if indicated):     Assets:  Desire for Improvement Resilience  ADL's:  fair  Cognition:  WNL  Sleep:  Number of Hours: 6.75   Assessment - patient continues to present with a depressed mood although he endorses som improvement in depressive symtpoms. He remains isolative with little participation in unit milieu. Denies SI at this time. To reduce symptoms to baseline and improve overall level of functioning will continue the following treatment plan without adjustments;   Treatment Plan Summary: Treatment plan reviewed as below today 6/22  Daily contact with patient to assess and evaluate symptoms and progress in treatment, Medication management, Plan inpatient management and medications as below Encourage group and milieu participation, to work on coping skills and symptom reduction Continue Prozac 20 mgrs QDAY for depression Increase Abilify to 5  mgrs QDAY as antidepressant augmentation Continue Vistaril 25 mgrs Q 6 hours PRN for anxiety as needed  ContinueTrazodone to 100 mgrs QHS PRN for insomnia  Continue Nicotine transdermal for nicotine cravings Treatment team working on disposition planning options Denzil MagnusonLaShunda Mihcael Ledee, NP 05/20/2017, 9:08 AM   Patient  ID: Angel [name], male   DOB: [birth year], 36 y.o.   MRN: [id number]

## 2017-05-20 NOTE — [facility] Group Notes (Signed)
[facility]  ([facility]   05/06/2017    1:00-2:00PM  Summary of Progress/Problems:   The main focus of today's process group was to   1)  discuss importance of adding supports to stay well once out of the hospital  2)  generate ideas about what healthy supports can be added   An emphasis was placed on using counselor, doctor, therapy groups, 12-step groups, and problem-specific support groups to expand supports.    The patient stated his brothers and his friends are his healthy supports right now.  At first he could not identify any supports to add, but eventually did state he needs a psychiatrist to stay on his medications.  Type of Therapy:  Process Group with Motivational Interviewing  Participation Level:  Active  Participation Quality:  Attentive  Affect:  Blunted  Cognitive:  Alert  Insight:  Improving  Engagement in Therapy:  Engaged  Modes of Intervention:   Education, Support and Processing  Ambrose MantleMareida Grossman-Orr, LCSW 05/06/2017    2:17 PM

## 2017-05-20 NOTE — [facility] Group Notes (Signed)
Adult Psychoeducational Group Note  Date:  05/20/2017 Time:  9:15 PM  Group Topic/Focus:  Wrap-Up Group:   The focus of this group is to help patients review their daily goal of treatment and discuss progress on daily workbooks.  Participation Level:  Active  Participation Quality:  Appropriate  Affect:  Appropriate  Cognitive:  Appropriate  Insight: Appropriate  Engagement in Group:  Engaged  Modes of Intervention:  Discussion  Additional Comments:  Pt. Goal for the day was to stay awake and not sleep the day away. Pt met goal for the day.  Wallace Going, Saifan Rayford E 05/20/2017, 9:15 PM

## 2017-05-21 NOTE — [facility] Group Notes (Signed)
[facility] LCSW Group Therapy  05/21/2017 1:15pm  Type of Therapy: Group Therapy   Topic: Overcoming Obstacles  Participation Level: Pt was present for the duration of the group. Pt did not participate in the discussion but listened attentively throughout.  Donzel Romack B Keyaira Clapham, MSW, LCSWA [phone]   

## 2017-05-21 NOTE — Progress Notes (Signed)
Patient ID: Angel DoctorMichael R Brum, male   DOB: 1981/03/20, 36 y.o.   MRN: [id number]  DAR: Pt. Denies SI/HI and A/V Hallucinations. He reports sleep is good, appetite is fair, energy level is normal, and concentration is good. He rates depression, hopelessness, and anxiety 0/10. However, patient's affect is flat and mood is depressed on writer's observation. His facial expression appears as frowning. Patient does not report any pain or discomfort at this time. Support and encouragement provided to the patient. Scheduled medications administered to patient per physician's orders. Patient is minimal with this Clinical research associatewriter but cooperative. He is seen in the milieu intermittently however is minimal with peers. Q15 minute checks are maintained for safety.

## 2017-05-21 NOTE — Progress Notes (Signed)
[facility] MD Progress Note  05/21/2017 1:52 PM LENDELL GALLICK  MRN:  [id number] Subjective:  Patient states that he is feeling better . He feels medications are helping . As he improves he is focusing more on being discharged soon, and complains of feeling bored on unit . Denies suicidal ideations .  Objective : I have discussed case with treatment team and have met with patient. Patient denies severe  depression at this time, and although affect is still somewhat blunted, is is more reactive, and smiles at times appropriately. Group participation has improved, and he is more visible in milieu, although tends to keep to self. Pleasant on approach. At this time he is future oriented, and states he is hoping to discharge soon, and plans to return to live with a friend.  Denies suicidal or self injurious ideations, describes improved appetite, energy level.  He denies medication side effects.     Principal Problem:  Major Depression Diagnosis:   Patient Active Problem List   Diagnosis Date Noted  . Major depressive disorder, recurrent episode ([place]) [F33.9] 05/16/2017  . Tobacco use disorder [F17.200] 07/17/2015  . Headache syndrome [G44.89] 07/17/2015  . Nicotine dependence [F17.200] 07/17/2015  . Generalized anxiety disorder [F41.1] 11/01/2013  . 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 Anxiety   . Depression   . Migraine    History reviewed. No pertinent surgical history. Family History:  Family History  Problem Relation Age of Onset  . Cancer Mother        brain  . Diabetes Brother   . Hypertension Brother    Social History:  History  Alcohol Use No     History  Drug Use No    Social History   Social History  . Marital status: Single    Spouse name: N/A  . Number of children: N/A  . Years of  education: N/A   Social History Main Topics  . Smoking status: Current Every Day Smoker    Packs/day: 1.50    Years: 20.00    Types: Cigarettes  . Smokeless tobacco: Never Used  . Alcohol use No  . Drug use: No  . Sexual activity: Not Asked   Other Topics Concern  . None   Social History Narrative  . None   Additional Social History:   Sleep: Good  Appetite:  Good  Current Medications: Current Facility-Administered Medications  Medication Dose Route Frequency Provider Last Rate Last Dose  . acetaminophen (TYLENOL) tablet 650 mg  650 mg Oral Q6H PRN Lindell Spar I, NP   650 mg at 05/20/17 0747  . alum & mag hydroxide-simeth (MAALOX/MYLANTA) 200-200-20 MG/5ML suspension 30 mL  30 mL Oral Q4H PRN Nwoko, Agnes I, NP      . ARIPiprazole (ABILIFY) tablet 5 mg  5 mg Oral Daily Delmas Faucett, Myer Peer, MD   5 mg at 05/21/17 0804  . FLUoxetine (PROZAC) capsule 20 mg  20 mg Oral Daily Ceria Suminski, Myer Peer, MD   20 mg at 05/21/17 0804  . hydrOXYzine (ATARAX/VISTARIL) tablet 25 mg  25 mg Oral Q6H PRN Lindell Spar I, NP   25 mg at 05/18/17 2117  . magnesium hydroxide (MILK OF MAGNESIA) suspension 30 mL  30 mL Oral Daily PRN Nwoko, Agnes I, NP      . nicotine (NICODERM CQ - dosed in  mg/24 hours) patch 21 mg  21 mg Transdermal Q0600 Lindell Spar I, NP   21 mg at 05/21/17 0803  . traZODone (DESYREL) tablet 100 mg  100 mg Oral QHS PRN Mordecai Maes, NP   100 mg at 05/20/17 2132    Lab Results:  No results found for this or any previous visit (from the past 48 hour(s)).  Blood Alcohol level:  Lab Results  Component Value Date   ETH <5 05/16/2017   ETH 5 50/35/4656    Metabolic Disorder Labs: Lab Results  Component Value Date   HGBA1C 5.3 05/17/2017   MPG 105 05/17/2017   Lab Results  Component Value Date   PROLACTIN 17.3 (H) 05/17/2017   Lab Results  Component Value Date   CHOL 202 (H) 05/17/2017   TRIG 165 (H) 05/17/2017   HDL 41 05/17/2017   CHOLHDL 4.9 05/17/2017   VLDL 33  05/17/2017   LDLCALC 128 (H) 05/17/2017    Physical Findings: AIMS: Facial and Oral Movements Muscles of Facial Expression: None, normal Lips and Perioral Area: None, normal Jaw: None, normal Tongue: None, normal,Extremity Movements Upper (arms, wrists, hands, fingers): None, normal Lower (legs, knees, ankles, toes): None, normal, Trunk Movements Neck, shoulders, hips: None, normal, Overall Severity Severity of abnormal movements (highest score from questions above): None, normal Incapacitation due to abnormal movements: None, normal Patient's awareness of abnormal movements (rate only patient's report): No Awareness, Dental Status Current problems with teeth and/or dentures?: No Does patient usually wear dentures?: No  CIWA:    COWS:     Musculoskeletal: Strength & Muscle Tone: within normal limits Gait & Station: normal Patient leans: N/A  Psychiatric Specialty Exam: Physical Exam  ROS  Denies headache, no chest pain, no dyspnea   Blood pressure 106/80, pulse 95, temperature 97.9 F (36.6 C), temperature source Oral, resp. rate 20, height 5' 6.75" (1.695 m), weight 82.6 kg (182 lb).Body mass index is 28.72 kg/m.  General Appearance: partially improved grooming   Eye Contact:  improving eye contact, and better related   Speech:  Normal Rate  Volume:  Decreased  Mood:  reports mood is improving   Affect:  less blunted, more reactive  Thought Process:  Goal Directed  Orientation:  Other:  fully alert and attentive  Thought Content:  denies hallucinations, no delusions, does not appear internally preoccupied   Suicidal Thoughts:  No  Denies current suicidal plan or intention and contracts for safety   Homicidal Thoughts:  No  Memory:  recent and remote grossly intact   Judgement:  Fair- improving   Insight:  Fair  Psychomotor Activity:  improving   Concentration:  Concentration: Good and Attention Span: Good  Recall:  Good  Fund of Knowledge:  Good  Language:  Fair   Akathisia:  Negative  Handed:  Left  AIMS (if indicated):     Assets:  Desire for Improvement Resilience  ADL's:  fair  Cognition:  WNL  Sleep:  Number of Hours: 6.25   Assessment - at this time patient is presenting with  improving mood and a more reactive range of affect. Denies SI.   Tolerating medications ( Abilify, Prozac )  well, denies side effects.   Treatment Plan Summary: Treatment plan reviewed as below today 6/25 Daily contact with patient to assess and evaluate symptoms and progress in treatment, Medication management, Plan inpatient management and medications as below Encourage group and milieu participation, to work on coping skills and symptom reduction Continue Prozac 20 mgrs QDAY  for depression Continue Abilify  5  mgrs QDAY as antidepressant augmentation Continue Vistaril 25 mgrs Q 6 hours PRN for anxiety as needed  Continue Trazodone 50 mgrs QHS PRN for insomnia  Continue Nicotine transdermal for nicotine cravings Treatment team working on disposition planning options Jenne Campus, MD 05/21/2017, 1:51 PM   Patient ID: Kathlynn Grate, male   DOB: 1981-10-17, 36 y.o.   MRN: [id number]

## 2017-05-21 NOTE — Progress Notes (Signed)
Recreation Therapy Notes  Date: 05/21/17 Time: 0930 Location: [address] Dayroom  Group Topic: Stress Management  Goal Area(s) Addresses:  Patient will verbalize importance of using healthy stress management.  Patient will identify positive emotions associated with healthy stress management.   Intervention: Stress Management  Activity :  [place]ace[place].  LRT introduced the stress management technique of guided imagery.  LRT read a script to allow patients the opportunity for take a mental journey to the beach.  Patients were to follow along as LRT read script to engage in the technique.  Education: Stress Management, Discharge Planning.   Education Outcome: Acknowledges edcuation/In group clarification offered/Needs additional education  Clinical Observations/Feedback: Pt did not attend group.   Saffron Busey Lillia AbedLIndsay, LRT/CTRS        Lillia AbedLindsay, Braleigh Massoud A 05/21/2017 1:02 PM

## 2017-05-22 NOTE — Progress Notes (Signed)
  DATA ACTION RESPONSE  Objective- Pt. is visible in the dayroom, seen interacting with peers. Presents with a blunt affect and mood. Guarded and minimal with interaction. No further c/o. No abnormal s/s.  Subjective- Denies having any SI/HI/AVH/Pain at this time. Pt. states "I am ready to go home". Is cooperative and remain safe on the unit.  1:1 interaction in private to establish rapport. Encouragement, education, & support given from staff.  PRN Trazodone requested and will re-eval accordingly.   Safety maintained with Q 15 checks. Continue with POC.

## 2017-05-22 NOTE — [facility] Group Notes (Signed)
[facility] LCSW Group Therapy 05/22/2017 1:15 PM  Type of Therapy: Group Therapy- Feelings about Diagnosis  Participation Level: Minimal  Participation Quality:  Appropriate  Affect:  Appropriate  Cognitive: Alert and Oriented   Insight:  Developing   Engagement in Therapy: Developing/Improving and Engaged   Modes of Intervention: Clarification, Confrontation, Discussion, Education, Exploration, Limit-setting, Orientation, Problem-solving, Rapport Building, Dance movement psychotherapisteality Testing, Socialization and Support  Description of Group:   This group will allow patients to explore their thoughts and feelings about diagnoses they have received. Patients will be guided to explore their level of understanding and acceptance of these diagnoses. Facilitator will encourage patients to process their thoughts and feelings about the reactions of others to their diagnosis, and will guide patients in identifying ways to discuss their diagnosis with significant others in their lives. This group will be process-oriented, with patients participating in exploration of their own experiences as well as giving and receiving support and challenge from other group members.  Summary of Progress/Problems:  Pt was present for the duration of the group and participated minimally. CSW asked pt what he would say to his diagnosis if his diagnosis was a person that was standing in front of him. Pt responded, "I'd tell it to go the hell away". Pt went on to explain that he would want to punch his diagnosis in the face if it was a person standing in front of him.  Therapeutic Modalities:   Cognitive Behavioral Therapy Solution Focused Therapy Motivational Interviewing Relapse Prevention Therapy  Donnelly StagerLynn Naara Kelty, MSW, LCSW 05/22/2017 3:23 PM

## 2017-05-22 NOTE — Plan of Care (Signed)
Problem: Safety: Goal: Periods of time without injury will increase Outcome: Progressing Pt. remains a low fall risk, denies SI/HI/AVH at this time, Q 15 checks in effect.    

## 2017-05-22 NOTE — Tx Team (Signed)
Interdisciplinary Treatment and Diagnostic Plan Update 05/22/2017 Time of Session: 9:30am  Rosalie DoctorMichael R Mccormac  MRN: [id number]  Principal Diagnosis: Major depressive disorder, recurrent episodes, severe without psychotic features.  Secondary Diagnoses: Active Problems:   Major depressive disorder, recurrent episode (HCC)   Current Medications:  Current Facility-Administered Medications  Medication Dose Route Frequency Provider Last Rate Last Dose  . acetaminophen (TYLENOL) tablet 650 mg  650 mg Oral Q6H PRN Armandina StammerNwoko, Agnes I, NP   650 mg at 05/20/17 0747  . alum & mag hydroxide-simeth (MAALOX/MYLANTA) 200-200-20 MG/5ML suspension 30 mL  30 mL Oral Q4H PRN Nwoko, Agnes I, NP      . ARIPiprazole (ABILIFY) tablet 5 mg  5 mg Oral Daily Cobos, Rockey SituFernando A, MD   5 mg at 05/22/17 0831  . FLUoxetine (PROZAC) capsule 20 mg  20 mg Oral Daily Cobos, Fernando A, MD   20 mg at 05/22/17 0830  . hydrOXYzine (ATARAX/VISTARIL) tablet 25 mg  25 mg Oral Q6H PRN Armandina StammerNwoko, Agnes I, NP   25 mg at 05/18/17 2117  . magnesium hydroxide (MILK OF MAGNESIA) suspension 30 mL  30 mL Oral Daily PRN Nwoko, Agnes I, NP      . nicotine (NICODERM CQ - dosed in mg/24 hours) patch 21 mg  21 mg Transdermal Q0600 Armandina StammerNwoko, Agnes I, NP   21 mg at 05/22/17 0830  . traZODone (DESYREL) tablet 100 mg  100 mg Oral QHS PRN Denzil Magnusonhomas, Lashunda, NP   100 mg at 05/21/17 2106    PTA Medications: Prescriptions Prior to Admission  Medication Sig Dispense Refill Last Dose  . citalopram (CELEXA) 20 MG tablet Take 20 mg by mouth daily.   05/15/2017 at Unknown time  . ibuprofen (ADVIL,MOTRIN) 200 MG tablet Take 400-600 mg by mouth every 6 (six) hours as needed for headache.   Taking  . traZODone (DESYREL) 100 MG tablet Take 100 mg by mouth at bedtime.   Past Week at Unknown time    Treatment Modalities: Medication Management, Group therapy, Case management,  1 to 1 session with clinician, Psychoeducation, Recreational therapy.  Patient Stressors:  Financial difficulties Medication change or noncompliance Patient Strengths: Wellsite geologistCommunication skills General fund of knowledge Physical Health  Physician Treatment Plan for Primary Diagnosis: Major depressive disorder, recurrent episodes, severe without psychotic features. Long Term Goal(s): Improvement in symptoms so as ready for discharge Short Term Goals: Ability to identify changes in lifestyle to reduce recurrence of condition will improve Ability to verbalize feelings will improve Ability to demonstrate self-control will improve Ability to identify and develop effective coping behaviors will improve Compliance with prescribed medications will improve Ability to identify triggers associated with substance abuse/mental health issues will improve  Medication Management: Evaluate patient's response, side effects, and tolerance of medication regimen.  Therapeutic Interventions: 1 to 1 sessions, Unit Group sessions and Medication administration.  Evaluation of Outcomes: Progressing  Physician Treatment Plan for Secondary Diagnosis: Active Problems:   Major depressive disorder, recurrent episode (HCC)  Long Term Goal(s): Improvement in symptoms so as ready for discharge  Short Term Goals: Ability to identify changes in lifestyle to reduce recurrence of condition will improve Ability to verbalize feelings will improve Ability to demonstrate self-control will improve Ability to identify and develop effective coping behaviors will improve Compliance with prescribed medications will improve Ability to identify triggers associated with substance abuse/mental health issues will improve  Medication Management: Evaluate patient's response, side effects, and tolerance of medication regimen.  Therapeutic Interventions: 1 to 1 sessions, Unit Group sessions  and Medication administration.  Evaluation of Outcomes: Progressing  RN Treatment Plan for Primary Diagnosis: Major depressive disorder,  recurrent episodes, severe without psychotic features. Long Term Goal(s): Knowledge of disease and therapeutic regimen to maintain health will improve  Short Term Goals: Compliance with prescribed medications will improve  Medication Management: RN will administer medications as ordered by provider, will assess and evaluate patient's response and provide education to patient for prescribed medication. RN will report any adverse and/or side effects to prescribing provider.  Therapeutic Interventions: 1 on 1 counseling sessions, Psychoeducation, Medication administration, Evaluate responses to treatment, Monitor vital signs and CBGs as ordered, Perform/monitor CIWA, COWS, AIMS and Fall Risk screenings as ordered, Perform wound care treatments as ordered.  Evaluation of Outcomes: Progressing  LCSW Treatment Plan for Primary Diagnosis: Major depressive disorder, recurrent episodes, severe without psychotic features. Long Term Goal(s): Safe transition to appropriate next level of care at discharge, Engage patient in therapeutic group addressing interpersonal concerns. Short Term Goals: Engage patient in aftercare planning with referrals and resources, Increase ability to appropriately verbalize feelings, Increase emotional regulation, Identify triggers associated with mental health/substance abuse issues and Increase skills for wellness and recovery  Therapeutic Interventions: Assess for all discharge needs, 1 to 1 time with Social worker, Explore available resources and support systems, Assess for adequacy in community support network, Educate family and significant other(s) on suicide prevention, Complete Psychosocial Assessment, Interpersonal group therapy.  Evaluation of Outcomes: Progressing  Progress in Treatment: Attending groups: Yes Participating in groups: No Taking medication as prescribed: Yes, MD continues to assess for medication changes as needed Toleration medication: Yes, no side  effects reported at this time Family/Significant other contact made: Yes, pt's brother contacted. Patient understands diagnosis: Developing insight  Discussing patient identified problems/goals with staff: Yes Medical problems stabilized or resolved: Yes Denies suicidal/homicidal ideation: Yes Issues/concerns per patient self-inventory: None Other: N/A  New problem(s) identified: None identified at this time.   New Short Term/Long Term Goal(s): None identified at this time.   Discharge Plan or Barriers: Pt will return home and follow up with an outpatient provider.  Reason for Continuation of Hospitalization:  Anxiety  Depression Medication stabilization Suicidal ideation  Estimated Length of Stay: 1-3 days; Estimated discharge date 05/25/17  Attendees: Patient: 05/22/2017 4:49 PM  Physician: Dr. Jama Flavors 05/22/2017 4:49 PM  Nursing: Rayfield Citizen RN; Jesusita Oka, RN 05/22/2017 4:49 PM  RN Care Manager: 05/22/2017 4:49 PM  Social Worker: Donnelly Stager, LCSWA 05/22/2017 4:49 PM  Recreational Therapist:  05/22/2017 4:49 PM  Other: Armandina Stammer, NP 05/22/2017 4:49 PM  Other:  05/22/2017 4:49 PM  Other: 05/22/2017 4:49 PM  Scribe for Treatment Team: Jonathon Jordan, MSW,LCSWA 05/22/2017 4:49 PM

## 2017-05-22 NOTE — Progress Notes (Signed)
D: Pt was in the dayroom upon initial approach.  Pt presents with depressed affect and mood.  His goal was "staying awake."  Pt denies SI/HI, denies hallucinations, denies pain.  Pt has been visible in milieu interacting with peers and staff appropriately.  Pt attended evening group.    A: Introduced self to pt.  Actively listened to pt and offered support and encouragement. PRN medication administered for sleep.  Q15 minute safety checks maintained.  R: Pt is safe on the unit.  Pt is compliant with medication.  Pt verbally contracts for safety.  Will continue to monitor and assess.

## 2017-05-22 NOTE — Progress Notes (Signed)
D: Pt presents with blunted affect and anxious mood on initial approach. Denies SI, HI, AVH and pain. Rates his depression 0/10, hopelessness 0/10 and anxiety 4/10 when assessed. Reports he's sleeping and eating well. Pt's goal for today is "going home and taking care of myself".  A: Availability and support provided to pt. All medications administered as per MD's order. Writer encouraged pt to voice concerns and comply with treatment regimen.  Q 15 minutes safety checks maintained on and off unit without outburst or self harm gestures to note thus far.  R: Pt receptive to care. Attended scheduled unit groups. Denies concerns at this time. Remains safe on and off unit.

## 2017-05-22 NOTE — Progress Notes (Signed)
Recreation Therapy Notes  Animal-Assisted Activity (AAA) Program Checklist/Progress Notes Patient Eligibility Criteria Checklist & Daily Group note for Rec TxIntervention  Date: 06.26.2018 Time: 2:45pm Location: [address] Dayroom   AAA/T Program Assumption of Risk Form signed by Patient/ or Parent Legal Guardian Yes  Patient is free of allergies or sever asthma Yes  Patient reports no fear of animals Yes  Patient reports no history of cruelty to animals Yes  Patient understands his/her participation is voluntary Yes  Patient washes hands before animal contact Yes  Patient washes hands after animal contact Yes  Behavioral Response: Appropriate   Education:Hand Washing, Appropriate Animal Interaction   Education Outcome: Acknowledges education.   Clinical Observations/Feedback: Patient attended session and interacted appropriately with therapy dog and peers.    Angel [name] Angel [name], Angel [name]        Angel [name] 05/22/2017 3:06 PM 

## 2017-05-22 NOTE — Progress Notes (Signed)
[facility] MD Progress Note  05/22/2017 2:27 PM Angel [name]  MRN:  [id number] Subjective:  Patient is reporting feeling better than on admission.  Denies medication side effects. Denies suicidal ideations .  Objective : I have discussed case with treatment team and have met with patient. Staff report is that patient describes improved mood, but that affect remains flat, depressed . Patient presents with partially improved mood and range of affect. Remains vaguely constricted, but smiles and even laughs briefly at times during session. Eye contact has also improved partially. He denies suicidal or self injurious ideations at this time. Thus far he is tolerating medications well, denies side effects. No disruptive or agitated behaviors on unit, he is going to some groups. In groups he is attentive, but participation is limited . Denies suicidal ideations. As he improves he is becoming more future oriented, and is expressing a plan of returning to live with friend after discharge.      Principal Problem:  Major Depression Diagnosis:   Patient Active Problem List   Diagnosis Date Noted  . Major depressive disorder, recurrent episode ([place]) [F33.9] 05/16/2017  . Tobacco use disorder [F17.200] 07/17/2015  . Headache syndrome [G44.89] 07/17/2015  . Nicotine dependence [F17.200] 07/17/2015  . Generalized anxiety disorder [F41.1] 11/01/2013  . 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 Anxiety   . Depression   . Migraine    History reviewed. No pertinent surgical history. Family History:  Family History  Problem Relation Age of Onset  . Cancer Mother        brain  . Diabetes Brother   . Hypertension Brother    Social History:  History  Alcohol Use No     History  Drug Use No    Social History    Social History  . Marital status: Single    Spouse name: N/A  . Number of children: N/A  . Years of education: N/A   Social History Main Topics  . Smoking status: Current Every Day Smoker    Packs/day: 1.50    Years: 20.00    Types: Cigarettes  . Smokeless tobacco: Never Used  . Alcohol use No  . Drug use: No  . Sexual activity: Not Asked   Other Topics Concern  . None   Social History Narrative  . None   Additional Social History:   Sleep: Good  Appetite:  Good  Current Medications: Current Facility-Administered Medications  Medication Dose Route Frequency Provider Last Rate Last Dose  . acetaminophen (TYLENOL) tablet 650 mg  650 mg Oral Q6H PRN Lindell Spar I, NP   650 mg at 05/20/17 0747  . alum & mag hydroxide-simeth (MAALOX/MYLANTA) 200-200-20 MG/5ML suspension 30 mL  30 mL Oral Q4H PRN Nwoko, Agnes I, NP      . ARIPiprazole (ABILIFY) tablet 5 mg  5 mg Oral Daily Cobos, Myer Peer, MD   5 mg at 05/22/17 0831  . FLUoxetine (PROZAC) capsule 20 mg  20 mg Oral Daily Cobos, Fernando A, MD   20 mg at 05/22/17 0830  . hydrOXYzine (ATARAX/VISTARIL) tablet 25 mg  25 mg Oral Q6H PRN Lindell Spar I, NP   25 mg at 05/18/17 2117  . magnesium hydroxide (MILK OF MAGNESIA) suspension 30 mL  30 mL Oral Daily PRN Lindell Spar I, NP      .  nicotine (NICODERM CQ - dosed in mg/24 hours) patch 21 mg  21 mg Transdermal Q0600 Lindell Spar I, NP   21 mg at 05/22/17 0830  . traZODone (DESYREL) tablet 100 mg  100 mg Oral QHS PRN Mordecai Maes, NP   100 mg at 05/21/17 2106    Lab Results:  No results found for this or any previous visit (from the past 48 hour(s)).  Blood Alcohol level:  Lab Results  Component Value Date   ETH <5 05/16/2017   ETH 5 88/91/6945    Metabolic Disorder Labs: Lab Results  Component Value Date   HGBA1C 5.3 05/17/2017   MPG 105 05/17/2017   Lab Results  Component Value Date   PROLACTIN 17.3 (H) 05/17/2017   Lab Results  Component Value Date    CHOL 202 (H) 05/17/2017   TRIG 165 (H) 05/17/2017   HDL 41 05/17/2017   CHOLHDL 4.9 05/17/2017   VLDL 33 05/17/2017   LDLCALC 128 (H) 05/17/2017    Physical Findings: AIMS: Facial and Oral Movements Muscles of Facial Expression: None, normal Lips and Perioral Area: None, normal Jaw: None, normal Tongue: None, normal,Extremity Movements Upper (arms, wrists, hands, fingers): None, normal Lower (legs, knees, ankles, toes): None, normal, Trunk Movements Neck, shoulders, hips: None, normal, Overall Severity Severity of abnormal movements (highest score from questions above): None, normal Incapacitation due to abnormal movements: None, normal Patient's awareness of abnormal movements (rate only patient's report): No Awareness, Dental Status Current problems with teeth and/or dentures?: No Does patient usually wear dentures?: No  CIWA:    COWS:     Musculoskeletal: Strength & Muscle Tone: within normal limits Gait & Station: normal Patient leans: N/A  Psychiatric Specialty Exam: Physical Exam  ROS no chest pain, no dyspnea , no vomiting   Blood pressure 107/79, pulse 85, temperature 99.1 F (37.3 C), temperature source Oral, resp. rate 18, height 5' 6.75" (1.695 m), weight 82.6 kg (182 lb).Body mass index is 28.72 kg/m.  General Appearance: Fairly Groomed  Eye Contact:  improving eye contact, and presents better related   Speech:  Normal Rate  Volume:  Decreased  Mood:  improved compared to admission presentation   Affect:  still restricted, but smiles briefly at times   Thought Process:  Goal Directed and Descriptions of Associations: Circumstantial  Orientation:  Other:  fully alert and attentive  Thought Content:  no hallucinations endorsed, not internally preoccupied, no delusions expressed   Suicidal Thoughts:  No  Denies current suicidal plan or intention and contracts for safety   Homicidal Thoughts:  No  Memory:  recent and remote grossly intact   Judgement:  Other:   gradually improving   Insight:  Fair and improving   Psychomotor Activity:  improving   Concentration:  Concentration: Good and Attention Span: Good  Recall:  Good  Fund of Knowledge:  Good  Language:  Fair  Akathisia:  Negative  Handed:  Left  AIMS (if indicated):     Assets:  Desire for Improvement Resilience  ADL's:  fair  Cognition:  WNL  Sleep:  Number of Hours: 6.75   Assessment - patient is presenting with partial improvement of mood , anxiety. Denies suicidal ideations- states he is feeling better and presents future oriented, wanting to return to live with a friend after discharge. Thus far tolerating Prozac, Abilify combination well .   Treatment Plan Summary: Treatment plan reviewed as below today 6/26 Daily contact with patient to assess and evaluate symptoms and progress in  treatment, Medication management, Plan inpatient management and medications as below Encourage group and milieu participation, to work on coping skills and symptom reduction Continue Prozac 20 mgrs QDAY for depression Continue Abilify  5  mgrs QDAY as antidepressant augmentation Continue Vistaril 25 mgrs Q 6 hours PRN for anxiety as needed  Continue Trazodone 50 mgrs QHS PRN for insomnia  Continue Nicotine transdermal for nicotine cravings Treatment team working on disposition planning options Jenne Campus, MD 05/22/2017, 2:27 PM   Patient ID: Kathlynn Grate, male   DOB: 06-Apr-1981, 36 y.o.   MRN: [id number] Patient ID: ORIS STAFFIERI, male   DOB: 03/04/1981, 36 y.o.   MRN: [id number]

## 2017-05-22 NOTE — Progress Notes (Signed)
Adult Psychoeducational Group Note  Date:  05/22/2017 Time:  10:25 PM  Group Topic/Focus:  Wrap-Up Group:   The focus of this group is to help patients review their daily goal of treatment and discuss progress on daily workbooks.  Participation Level:  Active  Participation Quality:  Appropriate  Affect:  Appropriate  Cognitive:  Alert  Insight: Appropriate  Engagement in Group:  Engaged  Modes of Intervention:  Discussion  Additional Comments:  Patient stated his day started off bad, but got better. Patient's goal for today was to talk to doctor about a discharge plan. Patient stated he will be discharging tomorrow.   Rosalie Buenaventura L Jerimyah Vandunk 05/22/2017, 10:25 PM

## 2017-05-23 ENCOUNTER — Encounter (HOSPITAL_COMMUNITY): Payer: Self-pay | Admitting: Behavioral Health

## 2017-05-23 MED ORDER — HYDROXYZINE HCL 25 MG PO TABS: 25.0000 mg | ORAL_TABLET | Freq: Four times a day (QID) | ORAL | 0 refills | Status: DC | PRN

## 2017-05-23 MED ORDER — ARIPIPRAZOLE 5 MG PO TABS: 5.0000 mg | ORAL_TABLET | Freq: Every day | ORAL | 0 refills | Status: DC

## 2017-05-23 MED ORDER — NICOTINE 21 MG/24HR TD PT24: 21.0000 mg | MEDICATED_PATCH | Freq: Every day | TRANSDERMAL | 0 refills | Status: DC

## 2017-05-23 MED ORDER — TRAZODONE HCL 100 MG PO TABS: 100.0000 mg | ORAL_TABLET | Freq: Every evening | ORAL | 0 refills | Status: DC | PRN

## 2017-05-23 MED ORDER — FLUOXETINE HCL 20 MG PO CAPS: 20.0000 mg | ORAL_CAPSULE | Freq: Every day | ORAL | 0 refills | Status: DC

## 2017-05-23 NOTE — [facility] Suicide Risk Assessment (Addendum)
[facility]   Principal Problem: Depression Discharge Diagnoses:  Patient Active Problem List   Diagnosis Date Noted  . Major depressive disorder, recurrent episode (HCC) [F33.9] 05/16/2017  . Tobacco use disorder [F17.200] 07/17/2015  . Headache syndrome [G44.89] 07/17/2015  . Nicotine dependence [F17.200] 07/17/2015  . Generalized anxiety disorder [F41.1] 11/01/2013  . Major depressive disorder, recurrent episode, severe, without mention of psychotic behavior [F33.2] 10/31/2013    Total Time spent with patient: 30 minutes  Musculoskeletal: Strength & Muscle Tone: within normal limits Gait & Station: normal Patient leans: N/A  Psychiatric Specialty Exam: ROS denies chest pain, no shortness of breath, no nausea, no vomiting, no fever   Blood pressure 98/80, pulse 92, temperature 97.7 F (36.5 C), resp. rate 18, height 5' 6.75" (1.695 m), weight 82.6 kg (182 lb).Body mass index is 28.72 kg/m.  General Appearance: improved grooming  Eye Contact::  Good  Speech:  Normal Rate409  Volume:  Normal  Mood:  denies depression, states mood is better   Affect:  more reactive, smiles at times appropriately   Thought Process:  Linear and Descriptions of Associations: Intact- mildly slowed    Orientation:  Full (Time, Place, and Person)  Thought Content:  denies hallucinations, no delusions, not internally preoccupied  Suicidal Thoughts:  No at this time denies suicidal or self injurious ideations, denies any homicidal or violent ideations  Homicidal Thoughts:  No  Memory:  recent and remote grossly intact   Judgement:  Fair- improved   Insight:  Fair  Psychomotor Activity:  Normal  Concentration:  Good  Recall:  Good  Fund of Knowledge:Good  Language: Good  Akathisia:  Negative  Handed:  Right  AIMS (if indicated):    no involuntary of abnormal movements noted or reported   Assets:  Communication Skills Desire for Improvement Resilience  Sleep:   Number of Hours: 6.75  Cognition: WNL  ADL's:  Intact   Mental Status Per Nursing Assessment::   On Admission:     Demographic Factors:  36 year old male, single, no children, unemployed ,lives with friend   Loss Factors: financial and housing stressors   Historical Factors: History of depression, prior psychiatric admissions, history of self cutting, no history of suicide attempts   Risk Reduction Factors:   Positive coping skills or problem solving skills  Continued Clinical Symptoms:  At this time patient is alert , attentive, calm, mood is improved compared to admission , states he feels better and currently denies severe depression or neuro-vegetative symptoms, affect is more reactive, no thought disorder, no suicidal or self injurious ideations, no homicidal or violent ideations, no hallucinations, no delusions. Denies medication side effects. No disruptive or agitated behaviors on unit.   Cognitive Features That Contribute To Risk:  No gross cognitive deficits noted upon discharge. Is alert , attentive, and oriented x 3   Suicide Risk:  Mild:  Suicidal ideation of limited frequency, intensity, duration, and specificity.  There are no identifiable plans, no associated intent, mild dysphoria and related symptoms, good self-control (both objective and subjective assessment), few other risk factors, and identifiable protective factors, including available and accessible social support.  Follow-up Information    [facility].   Why:  Please go for a walk-in appointment within 7 days of discharge to be established for outpatient services. Walk-in hours are Mon 9a-12p, Tues 12p-3p, and Thurs 8a-11a. Please arrive as early as possible to be sure that you are seen. Thank you. Contact information:  [address] [place] [address] [phone]           Plan Of Care/Follow-up recommendations:  Activity:  as tolerated  Diet:  Regular Tests:  NA Other:  See  below  Patient is leaving unit in good spirits  Plans to return home Plans to to follow up as above.   Craige Cotta, MD 05/23/2017, 12:07 PM

## 2017-05-23 NOTE — Progress Notes (Signed)
Patient ID: Angel [name], male   DOB: August 27, 1981, 36 y.o.   MRN: [id number]  Discharge Note: Belongings returned to patient at time of discharge. Patient denies any pain or discomfort. Discharge instructions and medications were reviewed with patient. Patient verbalized understanding of both medications and discharge instructions. Patient discharged to lobby where his ride was waiting. Q15 minute safety checks maintained until time of discharge. No distress upon discharge.

## 2017-05-23 NOTE — Progress Notes (Signed)
  [facility] :  Will you be returning to the same living situation after discharge:  Yes,  pt returning home. At discharge, do you have transportation home?: Yes,  pt has access to transportation. Do you have the ability to pay for your medications: Yes,  prescriptions and samples provided.  Release of information consent forms completed and in the chart;  Patient's signature needed at discharge.  Patient to Follow up at: Follow-up Information    [facility].   Why:  Please go for a walk-in appointment within 7 days of discharge to be established for outpatient services. Walk-in hours are Mon 9a-12p, Tues 12p-3p, and Thurs 8a-11a. Please arrive as early as possible to be sure that you are seen. Thank you. Contact information: [address] [place] [address] [phone]           Next level of care provider [facility]:no  Safety Planning and Suicide Prevention discussed: Yes,  with pt and with pt's brother.  Have you used any form of tobacco in the last 30 days? (Cigarettes, Smokeless Tobacco, Cigars, and/or Pipes): Yes  Has patient been referred to the Quitline?: Patient refused referral  Patient has been referred for addiction treatment: Yes  Jonathon JordanLynn B Daena Alper, MSW, LCSWA 05/23/2017, 11:53 AM

## 2017-05-23 NOTE — Progress Notes (Signed)
Recreation Therapy Notes  Date: 05/23/17 Time: 0930 Location: 300 [address] Dayroom  Group Topic: Stress Management  Goal Area(s) Addresses:  Patient will verbalize importance of using healthy stress management.  Patient will identify positive emotions associated with healthy stress management.   Intervention: Stress Management  Activity :  Meditation.  LRT introduced the stress management technique of meditation.  LRT played a meditation from the calm app to get patients engaged in the technique.  Patients were to follow along as the meditation played to participate.  Education:  Stress Management, Discharge Planning.   Education Outcome: Acknowledges edcuation/In group clarification offered/Needs additional education  Clinical Observations/Feedback: Pt did not attend group.   Caroll RancherMarjette Uziah Sorter, LRT/CTRS         Caroll RancherLindsay, Deiondra Denley A 05/23/2017 11:31 AM

## 2017-05-23 NOTE — Discharge Summary (Signed)
Physician Discharge Summary Note  Patient:  Angel [name] is an 36 y.o., male MRN:  [id number] DOB:  20-Dec-1980 Patient phone:  [phone] (home)  Patient address:   [address] [place] [address],  Total Time spent with patient: 30 minutes  Date of Admission:  05/16/2017 Date of Discharge: 05/23/2017  Reason for Admission:  Chronic depression,  cannabis use disorder, and SI.   Principal Problem: <principal problem not specified> Discharge Diagnoses: Patient Active Problem List   Diagnosis Date Noted  . Major depressive disorder, recurrent episode (HCC) [F33.9] 05/16/2017  . Tobacco use disorder [F17.200] 07/17/2015  . Headache syndrome [G44.89] 07/17/2015  . Nicotine dependence [F17.200] 07/17/2015  . Generalized anxiety disorder [F41.1] 11/01/2013  . Major depressive disorder, recurrent episode, severe, without mention of psychotic behavior [F33.2] 10/31/2013    Past Psychiatric History: Major depressive disorder  Past Medical History:  Past Medical History:  Diagnosis Date  . Anxiety   . Depression   . Migraine    History reviewed. No pertinent surgical history. Family History:  Family History  Problem Relation Age of Onset  . Cancer Mother        brain  . Diabetes Brother   . Hypertension Brother    Family Psychiatric  History: Major depression: Paternal/maternal aunts. Denies any familial hx of suicide. Social History:  History  Alcohol Use No     History  Drug Use No    Social History   Social History  . Marital status: Single    Spouse name: N/A  . Number of children: N/A  . Years of education: N/A   Social History Main Topics  . Smoking status: Current Every Day Smoker    Packs/day: 1.50    Years: 20.00    Types: Cigarettes  . Smokeless tobacco: Never Used  . Alcohol use No  . Drug use: No  . Sexual activity: Not Asked   Other Topics Concern  . None   Social History Narrative  . None    Hospital Course: Angel [name] is a 36 year old  Caucasian male with hx of chronic depression & cannabis use disorder. Presented to hospital voluntarily , reported worsening depression, which he states is chronic but worsening,suicidal ideations , with thoughts of cutting wrists. Of note, he has several superficial cuts on forearms, no bleeding, no sutures. States these were self inflicted and occurred within the last 2-3 days. Endorses neuro-vegetative symptoms- poor appetite, poor energy level, poor sleep, anhedonia. Denies psychotic symptoms. He states he has been feeling very stressed, and describes mainly financial . He also states that roommate's son and girlfriend recently moved in, making the home environment more tense. He reports he had been prescribed Celexa in the past , but states he has been off it for 2-3 months because " it stopped working ". He has had several prior psychiatric admissions, most recently 2016 . States admissions have been fore depression. Denies history of suicide attempts, but describes history of self cutting in the past. Denies history of psychosis. Denies history of mania. Denies history of PTSD. Describes history of social anxiety, anxious when speaking in front of others.   After the above admission assessment and during this hospital course, patients presenting symptoms were identified. Labs were reviewed and her UDS was positive for THC. No detoxification treatments were required. Patient was treated and discharged with the following medications; Prozac 20 mgrs QDAY for depression, Abilify  5  mgrs QDAY as antidepressant augmentationVistaril 25 mgrs Q 6 hours  PRN for anxiety as needed, Trazodone 50 mgrs QHS PRN for insomnia and Nicotine transdermal for nicotine cravings. AA/NA meetings were offered & held on the unit. While on the unit, patient was able to verbalize learned coping skills for better management of depression and suicidal thoughts upon returning home.    During the course of his hospitalization,  improvement was monitored by observation and Angel [name]'s daily  report of symptom reduction, presentation of good affect,and overall improvement in mood & behavior. . Patient tolerated her treatment regimen without any adverse effects reported.  Angel [name]'s case was presented during treatment team meeting this morning. The team members were all in agreement that Angel NeedleMichael was both mentally & medically stable to be discharged to continue mental health care on an outpatient basis as noted below. He was provided with all the necessary information needed to make this appointment without problems.  Upon discharge, Angel NeedleMichael denied any SI/HI, AVH, delusional thoughts, or paranoia. He was provided with a 7 day sample of [facility] medications to be taken to his local phamacy. [facility] in no apparent distress. Transportation per patients arrangement.  Physical Findings: AIMS: Facial and Oral Movements Muscles of Facial Expression: None, normal Lips and Perioral Area: None, normal Jaw: None, normal Tongue: None, normal,Extremity Movements Upper (arms, wrists, hands, fingers): None, normal Lower (legs, knees, ankles, toes): None, normal, Trunk Movements Neck, shoulders, hips: None, normal, Overall Severity Severity of abnormal movements (highest score from questions above): None, normal Incapacitation due to abnormal movements: None, normal Patient's awareness of abnormal movements (rate only patient's report): No Awareness, Dental Status Current problems with teeth and/or dentures?: No Does patient usually wear dentures?: No  CIWA:    COWS:     Musculoskeletal: Strength & Muscle Tone: within normal limits Gait & Station: normal Patient leans: N/A  Psychiatric Specialty Exam: SEE SRA BY MD Physical Exam  Nursing note and vitals reviewed. Constitutional: He is oriented to person, place, and time.  Neurological: He is alert and oriented to person, place, and time.     Review of Systems  Psychiatric/Behavioral: Positive for depression (stable ) and substance abuse (Hx of substance abuse ). Negative for hallucinations (stable ), memory loss and suicidal ideas. The patient is nervous/anxious (stable) and has insomnia (stable ).   All other systems reviewed and are negative.   Blood pressure 98/80, pulse 92, temperature 97.7 F (36.5 C), resp. rate 18, height 5' 6.75" (1.695 m), weight 182 lb (82.6 kg).Body mass index is 28.72 kg/m.   Have you used any form of tobacco in the last 30 days? (Cigarettes, Smokeless Tobacco, Cigars, and/or Pipes): Yes  Has this patient used any form of tobacco in the last 30 days? (Cigarettes, Smokeless Tobacco, Cigars, and/or Pipes) Yes. Prescription for Nicoderm patch for smoking cessation provided at discharge.   Blood Alcohol level:  Lab Results  Component Value Date   ETH <5 05/16/2017   ETH 5 12/27/2014    Metabolic Disorder Labs:  Lab Results  Component Value Date   HGBA1C 5.3 05/17/2017   MPG 105 05/17/2017   Lab Results  Component Value Date   PROLACTIN 17.3 (H) 05/17/2017   Lab Results  Component Value Date   CHOL 202 (H) 05/17/2017   TRIG 165 (H) 05/17/2017   HDL 41 05/17/2017   CHOLHDL 4.9 05/17/2017   VLDL 33 05/17/2017   LDLCALC 128 (H) 05/17/2017    See Psychiatric Specialty Exam and Suicide Risk Assessment completed by Attending  Physician prior to discharge.  Discharge destination:  Home  Is patient on multiple antipsychotic therapies at discharge:  No   Has Patient had three or more failed trials of antipsychotic monotherapy by history:  No  Recommended Plan for Multiple Antipsychotic Therapies: NA   Allergies as of 05/23/2017   No Known Allergies     Medication List    STOP taking these medications   citalopram 20 MG tablet Commonly known as:  CELEXA   ibuprofen 200 MG tablet Commonly known as:  ADVIL,MOTRIN     TAKE these medications     Indication  ARIPiprazole 5 MG  tablet Commonly known as:  ABILIFY Take 1 tablet (5 mg total) by mouth daily. For mood stabilization Start taking on:  05/24/2017  Indication:  mood disorder   FLUoxetine 20 MG capsule Commonly known as:  PROZAC Take 1 capsule (20 mg total) by mouth daily. For depression Start taking on:  05/24/2017  Indication:  Major Depressive Disorder   hydrOXYzine 25 MG tablet Commonly known as:  ATARAX/VISTARIL Take 1 tablet (25 mg total) by mouth every 6 (six) hours as needed for anxiety. For anxiety  Indication:  Anxiety Neurosis   nicotine 21 mg/24hr patch Commonly known as:  NICODERM CQ - dosed in mg/24 hours Place 1 patch (21 mg total) onto the skin daily at 6 (six) AM. For nicotine dependence Start taking on:  05/24/2017  Indication:  Nicotine Addiction   traZODone 100 MG tablet Commonly known as:  DESYREL Take 1 tablet (100 mg total) by mouth at bedtime as needed for sleep. What changed:  when to take this  reasons to take this  Indication:  Trouble Sleeping      Follow-up Information    [facility] Follow up.   Why:  Please go for a walk-in appointment within 7 days of discharge to be established for outpatient services. Walk-in hours are Mon 9a-12p, Tues 12p-3p, and Thurs 8a-11a. Please arrive as early as possible to be sure that you are seen. Thank you. Contact information: [address] [place] [address] [phone]           Follow-up recommendations:  Follow up with your outpatient provided for any medical issues. Activity & diet as recommended by your primary care provider.  Comments:  Patient is instructed prior to discharge to: Take all medications as prescribed by his/her mental healthcare provider. Report any adverse effects and or reactions from the medicines to his/her outpatient provider promptly. Patient has been instructed & cautioned: To not engage in alcohol and or illegal drug use while on prescription medicines. In the event of worsening  symptoms, patient is instructed to call the crisis hotline, 911 and or go to the nearest ED for appropriate evaluation and treatment of symptoms. To follow-up with his/her primary care provider for your other medical issues, concerns and or health care needs.  Signed: Denzil Magnuson, NP 05/23/2017, 10:42 AM   Patient seen, Suicide Assessment Completed.  Disposition Plan Reviewed

## 2017-05-23 NOTE — Progress Notes (Signed)
Patient ID: Angel [name], male   DOB: 10/16/1981, 36 y.o.   MRN: [id number]  DAR: Pt. Denies SI/HI and A/V Hallucinations. He reports sleep is good, appetite is good, energy level is normal, and concentration is good. He rates depression 0/10, hopelessness 0/10, and anxiety 0/10. His affect is blunted and mood appears depressed although patient is pleasant and cooperative during interaction. Patient does not report any pain or discomfort at this time. Support and encouragement provided to the patient. Patient is minimal with Clinical research associatewriter but is seen in the milieu. He reports that he feels ready for discharge. Q15 minute checks are maintained for safety.

## 2018-03-09 ENCOUNTER — Encounter (HOSPITAL_COMMUNITY): Payer: Self-pay | Admitting: Emergency Medicine

## 2018-03-09 ENCOUNTER — Other Ambulatory Visit: Payer: Self-pay

## 2018-03-09 ENCOUNTER — Emergency Department (HOSPITAL_COMMUNITY)
Admission: EM | Admit: 2018-03-09 | Discharge: 2018-03-09 | Disposition: A | Discharge status: Other Acute Inpatient Hospital | Payer: Self-pay | Attending: Emergency Medicine | Admitting: Emergency Medicine

## 2018-03-09 ENCOUNTER — Encounter (HOSPITAL_COMMUNITY): Payer: Self-pay | Admitting: *Deleted

## 2018-03-09 ENCOUNTER — Inpatient Hospital Stay (HOSPITAL_COMMUNITY)
Admission: AD | Admit: 2018-03-09 | Discharge: 2018-03-15 | DRG: 885 | Disposition: A | Discharge status: Home / Self Care | Payer: No Typology Code available for payment source | Source: Intra-hospital | Attending: Psychiatry | Admitting: Psychiatry

## 2018-03-09 DIAGNOSIS — F332 Major depressive disorder, recurrent severe without psychotic features: Secondary | ICD-10-CM | POA: Insufficient documentation

## 2018-03-09 DIAGNOSIS — G4700 Insomnia, unspecified: Secondary | ICD-10-CM | POA: Diagnosis not present

## 2018-03-09 DIAGNOSIS — F411 Generalized anxiety disorder: Secondary | ICD-10-CM | POA: Diagnosis present

## 2018-03-09 DIAGNOSIS — Z91120 Patient's intentional underdosing of medication regimen due to financial hardship: Secondary | ICD-10-CM

## 2018-03-09 DIAGNOSIS — T43226A Underdosing of selective serotonin reuptake inhibitors, initial encounter: Secondary | ICD-10-CM | POA: Diagnosis present

## 2018-03-09 DIAGNOSIS — K219 Gastro-esophageal reflux disease without esophagitis: Secondary | ICD-10-CM | POA: Diagnosis not present

## 2018-03-09 DIAGNOSIS — T43596A Underdosing of other antipsychotics and neuroleptics, initial encounter: Secondary | ICD-10-CM | POA: Diagnosis present

## 2018-03-09 DIAGNOSIS — R12 Heartburn: Secondary | ICD-10-CM | POA: Diagnosis not present

## 2018-03-09 DIAGNOSIS — Y999 Unspecified external cause status: Secondary | ICD-10-CM | POA: Insufficient documentation

## 2018-03-09 DIAGNOSIS — Z915 Personal history of self-harm: Secondary | ICD-10-CM | POA: Diagnosis not present

## 2018-03-09 DIAGNOSIS — R45851 Suicidal ideations: Secondary | ICD-10-CM | POA: Diagnosis present

## 2018-03-09 DIAGNOSIS — Y929 Unspecified place or not applicable: Secondary | ICD-10-CM | POA: Insufficient documentation

## 2018-03-09 DIAGNOSIS — F17210 Nicotine dependence, cigarettes, uncomplicated: Secondary | ICD-10-CM | POA: Diagnosis present

## 2018-03-09 DIAGNOSIS — Z046 Encounter for general psychiatric examination, requested by authority: Secondary | ICD-10-CM | POA: Insufficient documentation

## 2018-03-09 DIAGNOSIS — S51811A Laceration without foreign body of right forearm, initial encounter: Secondary | ICD-10-CM | POA: Insufficient documentation

## 2018-03-09 DIAGNOSIS — R450 Nervousness: Secondary | ICD-10-CM | POA: Diagnosis not present

## 2018-03-09 DIAGNOSIS — X781XXA Intentional self-harm by knife, initial encounter: Secondary | ICD-10-CM | POA: Insufficient documentation

## 2018-03-09 DIAGNOSIS — Z23 Encounter for immunization: Secondary | ICD-10-CM | POA: Insufficient documentation

## 2018-03-09 DIAGNOSIS — Y9389 Activity, other specified: Secondary | ICD-10-CM | POA: Insufficient documentation

## 2018-03-09 DIAGNOSIS — F419 Anxiety disorder, unspecified: Secondary | ICD-10-CM | POA: Diagnosis not present

## 2018-03-09 DIAGNOSIS — S51812A Laceration without foreign body of left forearm, initial encounter: Secondary | ICD-10-CM | POA: Insufficient documentation

## 2018-03-09 DIAGNOSIS — Z818 Family history of other mental and behavioral disorders: Secondary | ICD-10-CM | POA: Diagnosis not present

## 2018-03-09 LAB — COMPREHENSIVE METABOLIC PANEL
ALBUMIN: 4.2 g/dL (ref 3.5–5.0)
ALK PHOS: 51 U/L (ref 38–126)
ALT: 20 U/L (ref 17–63)
AST: 17 U/L (ref 15–41)
Anion gap: 10 (ref 5–15)
BILIRUBIN TOTAL: 0.5 mg/dL (ref 0.3–1.2)
BUN: 8 mg/dL (ref 6–20)
CALCIUM: 9.1 mg/dL (ref 8.9–10.3)
CO2: 25 mmol/L (ref 22–32)
Chloride: 102 mmol/L (ref 101–111)
Creatinine, Ser: 0.73 mg/dL (ref 0.61–1.24)
GFR calc non Af Amer: >60 mL/min (ref >60)
GLUCOSE: 93 mg/dL (ref 65–99)
POTASSIUM: 3.4 mmol/L — AB (ref 3.5–5.1)
SODIUM: 137 mmol/L (ref 135–145)
TOTAL PROTEIN: 7 g/dL (ref 6.5–8.1)

## 2018-03-09 LAB — CBC WITH DIFFERENTIAL/PLATELET
BASOS PCT: 1 %
Basophils Absolute: 0 10*3/uL (ref 0.0–0.1)
EOS ABS: 0.1 10*3/uL (ref 0.0–0.7)
Eosinophils Relative: 2 %
HCT: 42.4 % (ref 39.0–52.0)
HEMOGLOBIN: 14.4 g/dL (ref 13.0–17.0)
LYMPHS ABS: 2.5 10*3/uL (ref 0.7–4.0)
Lymphocytes Relative: 40 %
MCH: 29 pg (ref 26.0–34.0)
MCHC: 34 g/dL (ref 30.0–36.0)
MCV: 85.3 fL (ref 78.0–100.0)
MONO ABS: 0.5 10*3/uL (ref 0.1–1.0)
Monocytes Relative: 9 %
Neutro Abs: 3.1 10*3/uL (ref 1.7–7.7)
Neutrophils Relative %: 48 %
Platelets: 211 10*3/uL (ref 150–400)
RBC: 4.97 MIL/uL (ref 4.22–5.81)
RDW: 13.3 % (ref 11.5–15.5)
WBC: 6.3 10*3/uL (ref 4.0–10.5)

## 2018-03-09 LAB — RAPID URINE DRUG SCREEN, HOSP PERFORMED
AMPHETAMINES: NOT DETECTED
BENZODIAZEPINES: NOT DETECTED
Barbiturates: NOT DETECTED
COCAINE: NOT DETECTED
OPIATES: NOT DETECTED
TETRAHYDROCANNABINOL: POSITIVE — AB

## 2018-03-09 LAB — ETHANOL

## 2018-03-09 MED ORDER — TETANUS-DIPHTH-ACELL PERTUSSIS 5-2.5-18.5 LF-MCG/0.5 IM SUSP
0.5000 mL | Freq: Once | INTRAMUSCULAR | Status: AC
  Administered 2018-03-09: 0.5 mL via INTRAMUSCULAR
  Filled 2018-03-09: qty 0.5

## 2018-03-09 MED ORDER — ALUM & MAG HYDROXIDE-SIMETH 200-200-20 MG/5ML PO SUSP: 30.0000 mL | ORAL | Status: DC | PRN

## 2018-03-09 MED ORDER — NICOTINE POLACRILEX 2 MG MT GUM
CHEWING_GUM | OROMUCOSAL | Status: AC
  Administered 2018-03-09: 2 mg
  Filled 2018-03-09: qty 1

## 2018-03-09 MED ORDER — ACETAMINOPHEN 325 MG PO TABS
650.0000 mg | ORAL_TABLET | Freq: Four times a day (QID) | ORAL | Status: DC | PRN
  Administered 2018-03-13 – 2018-03-14 (×2): 650 mg via ORAL
  Filled 2018-03-09 (×2): qty 2

## 2018-03-09 MED ORDER — TRAZODONE HCL 50 MG PO TABS
50.0000 mg | ORAL_TABLET | Freq: Every evening | ORAL | Status: DC | PRN
  Administered 2018-03-09 – 2018-03-10 (×2): 50 mg via ORAL
  Filled 2018-03-09 (×7): qty 1

## 2018-03-09 MED ORDER — PNEUMOCOCCAL VAC POLYVALENT 25 MCG/0.5ML IJ INJ: 0.5000 mL | INJECTION | INTRAMUSCULAR | Status: DC

## 2018-03-09 MED ORDER — NICOTINE 21 MG/24HR TD PT24
21.0000 mg | MEDICATED_PATCH | Freq: Every day | TRANSDERMAL | Status: DC
  Administered 2018-03-10 – 2018-03-15 (×6): 21 mg via TRANSDERMAL
  Filled 2018-03-09 (×10): qty 1

## 2018-03-09 MED ORDER — HYDROXYZINE HCL 25 MG PO TABS
25.0000 mg | ORAL_TABLET | Freq: Three times a day (TID) | ORAL | Status: DC | PRN
  Administered 2018-03-09 – 2018-03-14 (×7): 25 mg via ORAL
  Filled 2018-03-09 (×3): qty 1
  Filled 2018-03-09: qty 10
  Filled 2018-03-09 (×2): qty 1
  Filled 2018-03-09: qty 10
  Filled 2018-03-09 (×2): qty 1

## 2018-03-09 MED ORDER — MAGNESIUM HYDROXIDE 400 MG/5ML PO SUSP: 30.0000 mL | Freq: Every day | ORAL | Status: DC | PRN

## 2018-03-09 NOTE — ED Provider Notes (Signed)
[facility] EMERGENCY DEPARTMENT Provider Note   CSN: [id number] Arrival date & time: 03/09/18  1729     History   Chief Complaint Chief Complaint  Patient presents with  . V70.1    HPI Angel [name] is a 37 y.o. male.  Patient states that he wants to kill himself.  He has been cutting his arm because he is depressed and wants to kill himself  The history is provided by the patient. No language interpreter was used.  Altered Mental Status   This is a recurrent problem. The problem has not changed since onset.Pertinent negatives include no confusion, no seizures and no hallucinations. Risk factors include illicit drug use. His past medical history does not include seizures.    Past Medical History:  Diagnosis Date  . Anxiety   . Depression   . Migraine     Patient Active Problem List   Diagnosis Date Noted  . Major depressive disorder, recurrent episode (HCC) 05/16/2017  . Tobacco use disorder 07/17/2015  . Headache syndrome 07/17/2015  . Nicotine dependence 07/17/2015  . Generalized anxiety disorder 11/01/2013  . Major depressive disorder, recurrent episode, severe, without mention of psychotic behavior 10/31/2013    History reviewed. No pertinent surgical history.      Home Medications    Prior to Admission medications   Not on File    Family History Family History  Problem Relation Age of Onset  . Cancer Mother        brain  . Diabetes Brother   . Hypertension Brother     Social History Social History   Tobacco Use  . Smoking status: Current Every Day Smoker    Packs/day: 1.50    Years: 20.00    Pack years: 30.00    Types: Cigarettes  . Smokeless tobacco: Never Used  Substance Use Topics  . Alcohol use: No  . Drug use: No     Allergies   Patient has no known allergies.   Review of Systems Review of Systems  Constitutional: Negative for appetite change and fatigue.  HENT: Negative for congestion, ear discharge and sinus  pressure.   Eyes: Negative for discharge.  Respiratory: Negative for cough.   Cardiovascular: Negative for chest pain.  Gastrointestinal: Negative for abdominal pain and diarrhea.  Genitourinary: Negative for frequency and hematuria.  Musculoskeletal: Negative for back pain.  Skin: Negative for rash.  Neurological: Negative for seizures and headaches.  Psychiatric/Behavioral: Positive for dysphoric mood. Negative for confusion and hallucinations.     Physical Exam Updated Vital Signs BP (!) 107/97 (BP Location: Left Arm)   Pulse 92   Temp 99.1 F (37.3 C) (Oral)   Resp 19   Wt 81.6 kg (180 lb)   SpO2 96%   BMI 28.40 kg/m   Physical Exam  Constitutional: He is oriented to person, place, and time. He appears well-developed.  HENT:  Head: Normocephalic.  Eyes: Conjunctivae and EOM are normal. No scleral icterus.  Neck: Neck supple. No thyromegaly present.  Cardiovascular: Normal rate and regular rhythm. Exam reveals no gallop and no friction rub.  No murmur heard. Pulmonary/Chest: No stridor. He has no wheezes. He has no rales. He exhibits no tenderness.  Abdominal: He exhibits no distension. There is no tenderness. There is no rebound.  Musculoskeletal: Normal range of motion. He exhibits no edema.  She has multiple superficial lacerations to both arms no need for sutures.  Lymphadenopathy:    He has no cervical adenopathy.  Neurological: He is  oriented to person, place, and time. He exhibits normal muscle tone. Coordination normal.  Skin: No rash noted. No erythema.  Psychiatric:  Patient is suicidal     ED Treatments / Results  Labs (all labs ordered are listed, but only abnormal results are displayed) Labs Reviewed  RAPID URINE DRUG SCREEN, HOSP PERFORMED - Abnormal; Notable for the following components:      Result Value   Tetrahydrocannabinol POSITIVE (*)    All other components within normal limits  COMPREHENSIVE METABOLIC PANEL - Abnormal; Notable for the  following components:   Potassium 3.4 (*)    All other components within normal limits  CBC WITH DIFFERENTIAL/PLATELET  ETHANOL    EKG None  Radiology No results found.  Procedures Procedures (including critical care time)  Medications Ordered in ED Medications  Tdap (BOOSTRIX) injection 0.5 mL (0.5 mLs Intramuscular Given 03/09/18 1834)     Initial Impression / Assessment and Plan / ED Course  I have reviewed the triage vital signs and the nursing notes.  Pertinent labs & imaging results that were available during my care of the patient were reviewed by me and considered in my medical decision making (see chart for details).     Patient is suicidal and medically cleared.  He is going to behavioral health for continued treatment  Final Clinical Impressions(s) / ED Diagnoses   Final diagnoses:  Suicidal ideations    ED Discharge Orders    None       Bethann BerkshireZammit, Shaylee Stanislawski, MD 03/09/18 1925

## 2018-03-09 NOTE — Progress Notes (Signed)
Angel [name] is a 37 year old male pt [facility] after cutting his wrists in a suicide attempt. He reports on-going depression and suicidal thoughts and reports that he has not had his medications for the past couple of months as he reports financial difficulties and not being able to afford the co-pay. He denies any substance abuse issues and reports that he would like to get back on some medications while he is here. He does have lacerations to both wrists that are dressed upon admission. He denies any current SI and is able to contract for safety while in the hospital. He reports that he lives with friends and reports that he can go back there after discharge. Angel [name] was oriented to the unit and safety maintained.

## 2018-03-09 NOTE — ED Notes (Signed)
BH called to let Wynona CanesChristine know that Pt will be recommended for inpatient placement.  Nurse informed.

## 2018-03-09 NOTE — ED Notes (Signed)
Pt given meal tray  Pt paced in scrubs, belongings placed in locker  Pt wanded by security

## 2018-03-09 NOTE — BH Assessment (Signed)
Tele Assessment Note   Patient Name: Angel [name] MRN: [id number] Referring Physician: Dr. Bethann BerkshireJoseph Zammit, MD Location of Patient: [facility] Location of Provider: Behavioral Health TTS Department  Angel [name] is a 37 y.o. male who was brought to the [facility] by EMS due to a suicide attempt by cutting the outer sides of his arms with razors. Pt states he has been "really depressed and suicidal," which started a few weeks ago. He states this is due to his roommate and his roommate's girlfriend always arguing. Pt acknowledges he stopped taking his medication approximately 3 months ago and states he did this due to not having the co-pay to see his psychiatrist nor his therapist. Pt states he has no insurance, so he is provided services on a sliding-scale fee.  Pt verifies he cut himself in an attempt to end his life. He acknowledges he has a history of suicide and that the last attempt was the last time he went to the hospital, which he believes was in 2018. Pt denies any HI and AVH. He shares he does engage in NSSIB via cutting and that the last incident was last week. Pt states he's attempted suicide approximately 4-5 times. He estimates he's been in the hospital 5-6 times, the last time being in 2018.  Pt denies any history of abuse at the hands of someone else. He denies any pending criminal charges, any upcoming court hearings, or being on probation. He states he is unemployed and does not receive a disability check. He denies any substance use.  Pt is oriented x4. His recent memory is intact, though his remote memory is impaired. Pt was cooperative throughout the assessment. Pt's judgement, insight, and impulse control are impaired.     Diagnosis: F33.2, Major depressive disorder, Recurrent episode, Severe   Past Medical History:  Past Medical History:  Diagnosis Date  . Anxiety   . Depression   . Migraine     History reviewed. No pertinent surgical history.  Family  History:  Family History  Problem Relation Age of Onset  . Cancer Mother        brain  . Diabetes Brother   . Hypertension Brother     Social History:  reports that he has been smoking cigarettes.  He has a 30.00 pack-year smoking history. He has never used smokeless tobacco. He reports that he does not drink alcohol or use drugs.  Additional Social History:  Alcohol / Drug Use Pain Medications: Please see MAR Prescriptions: Please see MAR Over the Counter: Please see MAR History of alcohol / drug use?: No history of alcohol / drug abuse Longest period of sobriety (when/how long): Please see MAR  CIWA: CIWA-Ar BP: (!) 107/97 Pulse Rate: 92 COWS:    Allergies: No Known Allergies  Home Medications:  (Not in a hospital admission)  OB/GYN Status:  No LMP for male patient.  General Assessment Data Location of Assessment: [facility] TTS Assessment: In system Is this a Tele or Face-to-Face Assessment?: Tele Assessment Is this an Initial Assessment or a Re-assessment for this encounter?: Initial Assessment Marital status: Single Maiden name: Rennis Hardingllis Is patient pregnant?: No Pregnancy Status: No Living Arrangements: Non-relatives/Friends Can pt return to current living arrangement?: Yes Admission Status: Voluntary Is patient capable of signing voluntary admission?: Yes Referral Source: Self/Family/Friend Insurance type: None  Medical Screening Exam [facility] Walk-in ONLY) Medical Exam completed: Yes  Crisis Care Plan Living Arrangements: Non-relatives/Friends Legal Guardian: Other:(N/A) [facility]: [facility] Name of Therapist: Judeth Cornfield - at [facility]  Education Status Is patient currently in school?: No Is the patient employed, unemployed or receiving disability?: Unemployed  Risk to self with the past 6 months Suicidal Ideation: Yes-Currently Present Has patient been a risk to self within the past 6 months prior to admission? : Yes Suicidal  Intent: Yes-Currently Present Has patient had any suicidal intent within the past 6 months prior to admission? : Yes Is patient at risk for suicide?: Yes Suicidal Plan?: Yes-Currently Present Has patient had any suicidal plan within the past 6 months prior to admission? : Yes Specify Current Suicidal Plan: Pt cut his arms today Access to Means: Yes Specify Access to Suicidal Means: Pt has access to objects to cut himself with What has been your use of drugs/alcohol within the last 12 months?: Pt denies Previous Attempts/Gestures: Yes How many times?: 5 Other Self Harm Risks: Unknown Triggers for Past Attempts: Other personal contacts, Unpredictable(Pt states he stopped his medication 3 months ago; arguing) Intentional Self Injurious Behavior: Cutting Comment - Self Injurious Behavior: Pt states he cut himself last week; has been doing this "a long time" Family Suicide History: Yes Recent stressful life event(s): Conflict (Comment), Other (Comment)(Roommate & girlfriend arguing, stopped meds 3 months ago) Persecutory voices/beliefs?: No Depression: Yes Depression Symptoms: Despondent, Tearfulness, Isolating, Fatigue, Guilt, Feeling worthless/self pity, Feeling angry/irritable Substance abuse history and/or treatment for substance abuse?: No Suicide prevention information given to non-admitted patients: Not applicable  Risk to Others within the past 6 months Homicidal Ideation: No Does patient have any lifetime risk of violence toward others beyond the six months prior to admission? : No Thoughts of Harm to Others: No Current Homicidal Intent: No Current Homicidal Plan: No Access to Homicidal Means: No Identified Victim: N/A History of harm to others?: No Assessment of Violence: On admission Violent Behavior Description: None noted Does patient have access to weapons?: No Criminal Charges Pending?: No Does patient have a court date: No Is patient on probation?:  No  Psychosis Hallucinations: None noted Delusions: None noted  Mental Status Report Appearance/Hygiene: Bizarre, In scrubs Eye Contact: Fair Motor Activity: Unremarkable Speech: Soft, Unremarkable Level of Consciousness: Quiet/awake Mood: Anxious, Sullen Affect: Anxious, Sullen Anxiety Level: Minimal Thought Processes: Relevant, Circumstantial Judgement: Impaired Orientation: Person, Place, Situation, Time Obsessive Compulsive Thoughts/Behaviors: None  Cognitive Functioning Concentration: Fair Memory: Recent Intact, Remote Impaired Is patient IDD: No Is patient DD?: No Insight: Poor Impulse Control: Poor Appetite: Good Have you had any weight changes? : No Change Sleep: No Change Total Hours of Sleep: 7 Vegetative Symptoms: Staying in bed  ADLScreening [facility] Assessment Services) Patient's cognitive ability adequate to safely complete daily activities?: Yes Patient able to express need for assistance with ADLs?: Yes Independently performs ADLs?: Yes (appropriate for developmental age)  Prior Inpatient Therapy Prior Inpatient Therapy: Yes Prior Therapy Dates: 2018, unknown Prior Therapy Facilty/Provider(s): [facility], unknown Reason for Treatment: MH, SI  Prior Outpatient Therapy Prior Outpatient Therapy: Yes Prior Therapy Dates: Unknown Prior Therapy Facilty/Provider(s): [facility] Reason for Treatment: MH, SI Does patient have an ACCT team?: No Does patient have Intensive In-House Services?  : No Does patient have [facility] services? : No Does patient have P4CC services?: No  ADL Screening (condition at time of admission) Patient's cognitive ability adequate to safely complete daily activities?: Yes Is the patient deaf or have difficulty hearing?: No Does the patient have difficulty seeing, even when wearing glasses/contacts?: No Does the patient have difficulty concentrating, remembering, or  making decisions?: No Patient able to express need for assistance with  ADLs?: Yes Does the patient have difficulty dressing or bathing?: No Independently performs ADLs?: Yes (appropriate for developmental age) Does the patient have difficulty walking or climbing stairs?: No       Abuse/Neglect Assessment (Assessment to be complete while patient is alone) Abuse/Neglect Assessment Can Be Completed: Yes Physical Abuse: Denies Verbal Abuse: Denies Sexual Abuse: Denies Exploitation of patient/patient's resources: Denies Self-Neglect: Denies Values / Beliefs Cultural Requests During Hospitalization: None Spiritual Requests During Hospitalization: None Consults Spiritual Care Consult Needed: No Social Work Consult Needed: No Merchant navy officer (For Healthcare) Does Patient Have a Medical Advance Directive?: No Would patient like information on creating a medical advance directive?: No - Patient declined          Disposition: Hillery Jacks NP reviewed pt's chart and information and determined he meets the criteria for inpatient hospitalization. The unit secretary, Claris Che, was informed of this decision at 35. Pt was accepted at [facility]; Claris Che was informed of this information at 87.   Disposition Initial Assessment Completed for this Encounter: Yes Patient referred to: Other (Comment)(Tanika Lewis NP determined pt meets criteria for inpt hosp)  This service was provided via telemedicine using a 2-way, interactive audio and video technology.  Names of all persons participating in this telemedicine service and their role in this encounter. Name: Angel [name] Role: Patient     Ralph Dowdy 03/09/2018 6:41 PM

## 2018-03-09 NOTE — Tx Team (Signed)
Initial Treatment Plan 03/09/2018 10:48 PM Angel [name] ZOX:[id number]    PATIENT STRESSORS: Financial difficulties Medication change or noncompliance   PATIENT STRENGTHS: Ability for insight Average or above average intelligence Capable of independent living General fund of knowledge   PATIENT IDENTIFIED PROBLEMS: Depression Suicidal thoughts "Not been taking my medicine"                     DISCHARGE CRITERIA:  Ability to meet basic life and health needs Improved stabilization in mood, thinking, and/or behavior Verbal commitment to aftercare and medication compliance  PRELIMINARY DISCHARGE PLAN: Attend aftercare/continuing care group Return to previous living arrangement  PATIENT/FAMILY INVOLVEMENT: This treatment plan has been presented to and reviewed with the patient, Angel [name], and/or family member, .  The patient and family have been given the opportunity to ask questions and make suggestions.  Liliauna Santoni, EffieBrook [name], [place]RN 03/09/2018, 10:48 PM

## 2018-03-09 NOTE — ED Triage Notes (Signed)
Per EMS, pt here for SI. Pt has lacerations on top of both arms from a razor blade. Pt began cutting himself yesterday. Pt has stopped taking all him medications, including psych meds 2 weeks ago. Bleeding controlled.

## 2018-03-09 NOTE — ED Notes (Signed)
[facility] Pt has been accepted there to rm [phone]. , by Hillery Jacksanika Lewis, NP, Attending is Dr. Jama Flavorsobos.  Can arrive at 8PM. Dr. Estell HarpinZammit and Trula Orehristina informed.

## 2018-03-09 NOTE — ED Notes (Signed)
TTS consult in process. 

## 2018-03-10 DIAGNOSIS — Z818 Family history of other mental and behavioral disorders: Secondary | ICD-10-CM

## 2018-03-10 DIAGNOSIS — F332 Major depressive disorder, recurrent severe without psychotic features: Principal | ICD-10-CM

## 2018-03-10 DIAGNOSIS — F419 Anxiety disorder, unspecified: Secondary | ICD-10-CM

## 2018-03-10 DIAGNOSIS — R45851 Suicidal ideations: Secondary | ICD-10-CM

## 2018-03-10 DIAGNOSIS — F17210 Nicotine dependence, cigarettes, uncomplicated: Secondary | ICD-10-CM

## 2018-03-10 MED ORDER — BACITRACIN-NEOMYCIN-POLYMYXIN OINTMENT TUBE
TOPICAL_OINTMENT | Freq: Two times a day (BID) | CUTANEOUS | Status: DC
  Administered 2018-03-11 – 2018-03-14 (×6): via TOPICAL
  Filled 2018-03-10: qty 1
  Filled 2018-03-10: qty 14.17

## 2018-03-10 MED ORDER — ARIPIPRAZOLE 5 MG PO TABS
5.0000 mg | ORAL_TABLET | Freq: Every day | ORAL | Status: DC
  Administered 2018-03-10 – 2018-03-14 (×5): 5 mg via ORAL
  Filled 2018-03-10 (×7): qty 1
  Filled 2018-03-10: qty 7

## 2018-03-10 MED ORDER — FLUOXETINE HCL 20 MG PO CAPS
20.0000 mg | ORAL_CAPSULE | Freq: Every day | ORAL | Status: DC
  Administered 2018-03-10 – 2018-03-15 (×6): 20 mg via ORAL
  Filled 2018-03-10 (×8): qty 1
  Filled 2018-03-10: qty 7

## 2018-03-10 NOTE — [facility] Group Notes (Signed)
[facility] LCSW Group Therapy Note  Date/Time: 03/10/18, 1000  Type of Therapy/Topic:  Group Therapy:  Feelings about Diagnosis  Participation Level:  None   Mood: unsure--appeared irritable   Description of Group:    This group will allow patients to explore their thoughts and feelings about diagnoses they have received. Patients will be guided to explore their level of understanding and acceptance of these diagnoses. Facilitator will encourage patients to process their thoughts and feelings about the reactions of others to their diagnosis, and will guide patients in identifying ways to discuss their diagnosis with significant others in their lives. This group will be process-oriented, with patients participating in exploration of their own experiences as well as giving and receiving support and challenge from other group members.   Therapeutic Goals: 1. Patient will demonstrate understanding of diagnosis as evidence by identifying two or more symptoms of the disorder:  2. Patient will be able to express two feelings regarding the diagnosis 3. Patient will demonstrate ability to communicate their needs through discussion and/or role plays  Summary of Patient Progress: Pt came to group but kept his head down, made no comments during discussion.  Pt did respond to several CSW questions but did not appear engaged.        Therapeutic Modalities:   Cognitive Behavioral Therapy Brief Therapy Feelings Identification   Daleen SquibbGreg Kayshaun Polanco, LCSW

## 2018-03-10 NOTE — [facility] Suicide Risk Assessment (Signed)
[facility]   Nursing information obtained from:    Demographic factors:    Current Mental Status:    Loss Factors:    Historical Factors:    Risk Reduction Factors:     Total Time spent with patient: 30 minutes Principal Problem: <principal problem not specified> Diagnosis:   Patient Active Problem List   Diagnosis Date Noted  . Severe recurrent major depression without psychotic features (HCC) [F33.2] 03/09/2018  . Major depressive disorder, recurrent episode (HCC) [F33.9] 05/16/2017  . Tobacco use disorder [F17.200] 07/17/2015  . Headache syndrome [G44.89] 07/17/2015  . Nicotine dependence [F17.200] 07/17/2015  . Generalized anxiety disorder [F41.1] 11/01/2013  . Major depressive disorder, recurrent episode, severe, without mention of psychotic behavior [F33.2] 10/31/2013   Subjective Data: Patient is seen and examined.  Patient is a 37 year old male with a past psychiatric history significant for depression and anxiety who presented to the [facility] with suicidal ideation.  Patient has a history of depression anxiety and stated he had stopped taking his medications approximately 3 months ago.  He stated he did not have the co-pay to be able to see a psychiatrist or therapist.  He had no insurance and was on a sliding scale.  He cut both wrists in an attempt to end his life.  He was [facility].  He was discharged on Abilify, hydroxyzine and fluoxetine.  The patient stated that when he went today marked after discharge that they changed his medications, but he does not recall what they were changed to.  He stated been in the psychiatric hospital at least 20 times throughout his lifetime.  He stated his greatest stressor was that he was being evicted from a place that he been living a great deal of time.  He stated that his roommate would not pay his rent.  He was admitted to the hospital for evaluation and  stabilization.  Continued Clinical Symptoms:  Alcohol Use Disorder Identification Test Final Score (AUDIT): 0 The "Alcohol Use Disorders Identification Test", Guidelines for Use in Primary Care, Second Edition.  World Science writerHealth Organization [facility](WHO). Score between 0-7:  no or low risk or alcohol related problems. Score between 8-15:  moderate risk of alcohol related problems. Score between 16-19:  high risk of alcohol related problems. Score 20 or above:  warrants further diagnostic evaluation for alcohol dependence and treatment.   CLINICAL FACTORS:   Depression:   Anhedonia Hopelessness Impulsivity Insomnia   Musculoskeletal: Strength & Muscle Tone: within normal limits Gait & Station: normal Patient leans: N/A  Psychiatric Specialty Exam: Physical Exam  Constitutional: He is oriented to person, place, and time. He appears well-developed and well-nourished.  HENT:  Head: Normocephalic and atraumatic.  Respiratory: Effort normal.  Musculoskeletal: Normal range of motion.  Neurological: He is alert and oriented to person, place, and time.    ROS  Blood pressure 97/73, pulse 96, temperature 98.1 F (36.7 C), temperature source Oral, resp. rate 18, height 5\' 8"  (1.727 m), weight 81.2 kg (179 lb).Body mass index is 27.22 kg/m.  General Appearance: Disheveled  Eye Contact:  Poor  Speech:  Slow  Volume:  Decreased  Mood:  Depressed  Affect:  Congruent  Thought Process:  Coherent  Orientation:  Full (Time, Place, and Person)  Thought Content:  Logical  Suicidal Thoughts:  Yes.  without intent/plan  Homicidal Thoughts:  No  Memory:  Immediate;   Fair  Judgement:  Impaired  Insight:  Lacking  Psychomotor Activity:  Psychomotor Retardation  Concentration:  Concentration: Poor  Recall:  Poor  Fund of Knowledge:  Fair  Language:  Fair  Akathisia:  No  Handed:  Right  AIMS (if indicated):     Assets:  Desire for Improvement  ADL's:  Intact  Cognition:  WNL  Sleep:  Number  of Hours: 6.75      COGNITIVE FEATURES THAT CONTRIBUTE TO RISK:  None    SUICIDE RISK:   Mild:  Suicidal ideation of limited frequency, intensity, duration, and specificity.  There are no identifiable plans, no associated intent, mild dysphoria and related symptoms, good self-control (both objective and subjective assessment), few other risk factors, and identifiable protective factors, including available and accessible social support.  PLAN OF CARE: Patient is seen and examined.  Patient is a 37 year old male with a past psychiatric history significant for major depression as well as anxiety.  He had a self-inflicted wound to the wrist bilaterally.  He admitted to helplessness, hopelessness and worthlessness.  He admitted to suicidal ideation.  He has been previously hospitalized on at least 20 occasions.  He was last hospitalized in 2018.  He stopped his medications approximately 3 months ago.  He will be admitted to the inpatient unit.  He will be integrated into the milieu.  I am going to restart his Prozac and Abilify.  We will continue the hydroxyzine.  He will attend groups.  He will see the social worker individually.  His drug screen had marijuana in it, but no alcohol, and he stated he had not been drinking alcohol recently.  We will still monitor for alcohol withdrawal syndromes given his history.  He will be placed on 15-minute checks for safety.  I certify that inpatient services furnished can reasonably be expected to improve the patient's condition.   Antonieta Pert, MD 03/10/2018, 9:53 AM

## 2018-03-10 NOTE — [facility] Counselor (Signed)
Adult Comprehensive Assessment  Patient ID: Angel [name], male   DOB: May 16, 1981, 37 y.o.   MRN: [id number]  Information Source:  Current Stressors:  Employment / Job issues: Pt continues to have no steady employment. Family Relationships: Limited contact with siblings, none with father. Financial / Lack of resources (include bankruptcy): Very limited income, significant financial stress. Housing / Lack of housing: none- Pt living with a friend and the friend's girlfriend. "They argue all the time."   Living/Environment/Situation:  Living Arrangements: Non-relatives/Friends Living conditions (as described by patient or guardian): stressful due to roommate and his girlfriend arguing all the time. How long has patient lived in current situation?: 5-6 years What is atmosphere in current home: stressful  Family History:  Marital status: Single Currently sexually active: No Sexual orientation: heterosexual Does patient have children?: No  Childhood History:  By whom was/is the patient raised?: Both parents Additional childhood history information: Patient reports having a poor and rough childhood: significant poverty and father was gone all the time, cheated on pt's mother regularly. Parents divorced when pt was 14. Description of patient's relationship with caregiver when they were a child: Good with mother but father was never at home Patient's description of current relationship with people who raised him/her: No relationship with father - mother is deceased Does patient have siblings?: Yes Number of Siblings: 3 Description of patient's current relationship with siblings: Okay relationships Did patient suffer any verbal/emotional/physical/sexual abuse as a child?: No Did patient suffer from severe childhood neglect?: No Has patient ever been sexually abused/assaulted/raped as an adolescent or adult?: No Was the patient ever a victim of a crime or a disaster?: No Witnessed  domestic violence?: No Has patient been effected by domestic violence as an adult?: No  Education:  Highest grade of school patient has completed: 9th Currently a student?: No Learning disability?: yes,  Unspecified  Employment/Work Situation:  Employment situation: Working odd jobs Patient's job has been impacted by current illness: No What is the longest time patient has a held a job?: 20 years 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 If attempted suicide, did drugs/alcohol play a role in this?: No Alcohol/Substance Abuse Treatment Hx: Denies past history Has alcohol/substance abuse ever caused legal problems?: No  Social Support System: Patient's Community Support System: Fair Describe Community Support System: Brother is supportive. Type of faith/religion: None How does patient's faith help to cope with current illness?: N/A  Leisure/Recreation:  Leisure and Hobbies: None  Strengths/Needs:  What things does the patient do well?: Kind hearted, drawing. In what areas does patient struggle / problems for patient: Everything  Discharge Plan:  Does patient have access to transportation?: Yes Will patient be returning to same living situation after discharge?: Yes Currently receiving community mental health services: No If no, would patient like referral for services when discharged?: Yes [facility] in [place] or [place]) Does patient have financial barriers related to discharge medications?: Yes Patient description of barriers related to discharge medications: No  income or insurance.     Summary/Recommendations:   Summary and Recommendations (to be completed by the evaluator): Pt is  37 year old [place] [facility]  Pt is diagnosed with major depressive disorder and was admitted after a suicide attempt.  Pt reports increased depression due to financial stress and a stressful living situation.  Recommendations for pt include crisis stabilization, therapeutic milieu, attend and participate in groups, medicaiton management, and development of comprhensive mental wellness plan.  Lorri FrederickWierda, Trejan Buda Jon. 03/10/2018

## 2018-03-10 NOTE — Progress Notes (Signed)
D Patient is seen OOB UAL on the 400 hall today..he is quiet, he is isolative, he avoids eye contact. He wears hospital-supplied patietn scrubs and both of his upper forearms have kelex wrapped around them ( he says he cut is arms purposely).      A He completed his daily assessment today and on this he wrote he denied SI today and he rated his depression, hopelessness and anxiety " 07/05/09", respectively.     R Safety is in place and poc includes encouraging pt to identify healthier coping skills.

## 2018-03-10 NOTE — Progress Notes (Signed)
Patient ID: Angel [name], male   DOB: Apr 21, 1981, 37 y.o.   MRN: [id number] DAR: Pt isolated to room; remained in bed with eyes closed. Pt at the time of assessment endorsed moderate depression. Pt denied any anxiety, SI/HI or AVH; "I am just a little sleepy and depressed." Pt was med compliant. Will continue to monitor for safety.

## 2018-03-10 NOTE — Progress Notes (Signed)
Writer entered patients room and observed him lying in bed awake Write iIntroduced self to him. Writer offered medication to help him rest and he reported that he would need something to help him sleep. He was offered a snack but declined and returned to his room.

## 2018-03-10 NOTE — H&P (Signed)
Psychiatric Admission Assessment Adult  Patient Identification: Angel [name] MRN:  [id number] Date of Evaluation:  03/10/2018 Chief Complaint:  MDD,rec,sev Principal Diagnosis: <principal problem not specified> Diagnosis:   Patient Active Problem List   Diagnosis Date Noted  . Severe recurrent major depression without psychotic features ([place]) [F33.2] 03/09/2018  . Major depressive disorder, recurrent episode ([place]) [F33.9] 05/16/2017  . Tobacco use disorder [F17.200] 07/17/2015  . Headache syndrome [G44.89] 07/17/2015  . Nicotine dependence [F17.200] 07/17/2015  . Generalized anxiety disorder [F41.1] 11/01/2013  . Major depressive disorder, recurrent episode, severe, without mention of psychotic behavior [F33.2] 10/31/2013   History of Present Illness: Patient is seen and examined.  Patient is a 37 year old male with a past psychiatric history significant for depression and anxiety who presented to a local emergency department with suicidal ideation.  The patient has a reported history of depression as well as anxiety and stated he had stopped taking his medications proximally 3 months ago.  He stated he did not take his medications because he was unable to have the co-pay to see his psychiatrist or therapist at his local mental health center.  He was going to the clinic on a sliding scale.  He was notified this week that he was going to be evicted, and that depressed him even more so.  He stated his roommate was supposed to be paying that rent.  He became upset, and did superficial cuts on both wrists in an attempt to end his life.  He has multiple psychiatric hospitalizations in the past.  He stated been hospitalized about 20 times in the past.  His last hospitalization here was in 2018.  He was discharged on Abilify, hydroxyzine and fluoxetine.  The patient stated that the outpatient clinic had changed his medications, but he was unable to recall why they change them or what they changed them  to.  He was admitted to the hospital for evaluation and stabilization. Associated Signs/Symptoms: Depression Symptoms:  depressed mood, anhedonia, insomnia, psychomotor retardation, fatigue, feelings of worthlessness/guilt, difficulty concentrating, hopelessness, suicidal thoughts without plan, suicidal attempt, anxiety, loss of energy/fatigue, (Hypo) Manic Symptoms:  Impulsivity, Anxiety Symptoms:  Excessive Worry, Psychotic Symptoms:  Patient denied any psychotic symptoms PTSD Symptoms: Negative Total Time spent with patient: 45 minutes  Past Psychiatric History: Patient admitted to multiple hospitalizations in the past.  He also admitted that he been hospitalized in the past because of substance related issues including alcohol.  Is the patient at risk to self? Yes.    Has the patient been a risk to self in the past 6 months? Yes.    Has the patient been a risk to self within the distant past? No.  Is the patient a risk to others? No.  Has the patient been a risk to others in the past 6 months? No.  Has the patient been a risk to others within the distant past? No.   Prior Inpatient Therapy:   Prior Outpatient Therapy:    Alcohol Screening: 1. How often do you have a drink containing alcohol?: Never 2. How many drinks containing alcohol do you have on a typical day when you are drinking?: 1 or 2 3. How often do you have six or more drinks on one occasion?: Never AUDIT-C Score: 0 4. How often during the last year have you found that you were not able to stop drinking once you had started?: Never 5. How often during the last year have you failed to do what was normally expected  from you becasue of drinking?: Never 6. How often during the last year have you needed a first drink in the morning to get yourself going after a heavy drinking session?: Never 7. How often during the last year have you had a feeling of guilt of remorse after drinking?: Never 8. How often during the  last year have you been unable to remember what happened the night before because you had been drinking?: Never 9. Have you or someone else been injured as a result of your drinking?: No 10. Has a relative or friend or a doctor or another health worker been concerned about your drinking or suggested you cut down?: No Alcohol Use Disorder Identification Test Final Score (AUDIT): 0 Intervention/Follow-up: AUDIT Score <7 follow-up not indicated Substance Abuse History in the last 12 months:  Yes.   Consequences of Substance Abuse: Negative Previous Psychotropic Medications: Yes  Psychological Evaluations: Yes  Past Medical History:  Past Medical History:  Diagnosis Date  . Anxiety   . Depression   . Migraine    History reviewed. No pertinent surgical history. Family History:  Family History  Problem Relation Age of Onset  . Cancer Mother        brain  . Diabetes Brother   . Hypertension Brother    Family Psychiatric  History: Major depression.  Paternal/maternal aunts.  Denied family history of suicide attempts. Tobacco Screening: Have you used any form of tobacco in the last 30 days? (Cigarettes, Smokeless Tobacco, Cigars, and/or Pipes): Yes Tobacco use, Select all that apply: 5 or more cigarettes per day Are you interested in Tobacco Cessation Medications?: Yes, will notify MD for an order Counseled patient on smoking cessation including recognizing danger situations, developing coping skills and basic information about quitting provided: Refused/Declined practical counseling Social History:  Social History   Substance and Sexual Activity  Alcohol Use No     Social History   Substance and Sexual Activity  Drug Use No    Additional Social History:                           Allergies:  No Known Allergies Lab Results:  Results for orders placed or performed during the hospital encounter of 03/09/18 (from the past 48 hour(s))  Rapid urine drug screen (hospital  performed)     Status: Abnormal   Collection Time: 03/09/18  5:42 PM  Result Value Ref Range   Opiates NONE DETECTED NONE DETECTED   Cocaine NONE DETECTED NONE DETECTED   Benzodiazepines NONE DETECTED NONE DETECTED   Amphetamines NONE DETECTED NONE DETECTED   Tetrahydrocannabinol POSITIVE (A) NONE DETECTED   Barbiturates NONE DETECTED NONE DETECTED    Comment: (NOTE) DRUG SCREEN FOR MEDICAL PURPOSES ONLY.  IF CONFIRMATION IS NEEDED FOR ANY PURPOSE, NOTIFY LAB WITHIN 5 DAYS. LOWEST DETECTABLE LIMITS FOR URINE DRUG SCREEN Drug Class                     Cutoff (ng/mL) Amphetamine and metabolites    1000 Barbiturate and metabolites    200 Benzodiazepine                 891 Tricyclics and metabolites     300 Opiates and metabolites        300 Cocaine and metabolites        300 THC  52 Performed at [facility], [address] [place], [place] [address]   CBC with Differential/Platelet     Status: None   Collection Time: 03/09/18  5:56 PM  Result Value Ref Range   WBC 6.3 4.0 - 10.5 K/uL   RBC 4.97 4.22 - 5.81 MIL/uL   Hemoglobin 14.4 13.0 - 17.0 g/dL   HCT 42.4 39.0 - 52.0 %   MCV 85.3 78.0 - 100.0 fL   MCH 29.0 26.0 - 34.0 pg   MCHC 34.0 30.0 - 36.0 g/dL   RDW 13.3 11.5 - 15.5 %   Platelets 211 150 - 400 K/uL   Neutrophils Relative % 48 %   Neutro Abs 3.1 1.7 - 7.7 K/uL   Lymphocytes Relative 40 %   Lymphs Abs 2.5 0.7 - 4.0 K/uL   Monocytes Relative 9 %   Monocytes Absolute 0.5 0.1 - 1.0 K/uL   Eosinophils Relative 2 %   Eosinophils Absolute 0.1 0.0 - 0.7 K/uL   Basophils Relative 1 %   Basophils Absolute 0.0 0.0 - 0.1 K/uL    Comment: Performed at [facility], [address], [place], [place] [address]  Comprehensive metabolic panel     Status: Abnormal   Collection Time: 03/09/18  5:56 PM  Result Value Ref Range   Sodium 137 135 - 145 mmol/L   Potassium 3.4 (L) 3.5 - 5.1 mmol/L   Chloride 102 101 - 111 mmol/L   CO2 25 22 - 32 mmol/L    Glucose, Bld 93 65 - 99 mg/dL   BUN 8 6 - 20 mg/dL   Creatinine, Ser 0.73 0.61 - 1.24 mg/dL   Calcium 9.1 8.9 - 10.3 mg/dL   Total Protein 7.0 6.5 - 8.1 g/dL   Albumin 4.2 3.5 - 5.0 g/dL   AST 17 15 - 41 U/L   ALT 20 17 - 63 U/L   Alkaline Phosphatase 51 38 - 126 U/L   Total Bilirubin 0.5 0.3 - 1.2 mg/dL   GFR calc non Af Amer >60 >60 mL/min   GFR calc Af Amer >60 >60 mL/min    Comment: (NOTE) The eGFR has been calculated using the CKD EPI equation. This calculation has not been validated in all clinical situations. eGFR's persistently <60 mL/min signify possible Chronic Kidney Disease.    Anion gap 10 5 - 15    Comment: Performed at [facility], [address], [place], [place] [address]  Ethanol     Status: None   Collection Time: 03/09/18  5:57 PM  Result Value Ref Range   Alcohol, Ethyl (B) <10 <10 mg/dL    Comment: Performed at [facility], [address], [place], [place] [address]    Blood Alcohol level:  Lab Results  Component Value Date   [facility] <10 03/09/2018   ETH <5 10/93/2355    Metabolic Disorder Labs:  Lab Results  Component Value Date   HGBA1C 5.3 05/17/2017   MPG 105 05/17/2017   Lab Results  Component Value Date   PROLACTIN 17.3 (H) 05/17/2017   Lab Results  Component Value Date   CHOL 202 (H) 05/17/2017   TRIG 165 (H) 05/17/2017   HDL 41 05/17/2017   CHOLHDL 4.9 05/17/2017   VLDL 33 05/17/2017   LDLCALC 128 (H) 05/17/2017    Current Medications: Current Facility-Administered Medications  Medication Dose Route Frequency Provider Last Rate Last Dose  . acetaminophen (TYLENOL) tablet 650 mg  650 mg Oral Q6H PRN Rozetta Nunnery, NP      .  alum & mag hydroxide-simeth (MAALOX/MYLANTA) 200-200-20 MG/5ML suspension 30 mL  30 mL Oral Q4H PRN Lindon Romp A, NP      . ARIPiprazole (ABILIFY) tablet 5 mg  5 mg Oral QHS Sharma Covert, MD      . FLUoxetine (PROZAC) capsule 20 mg  20 mg Oral Daily Sharma Covert, MD      . hydrOXYzine  (ATARAX/VISTARIL) tablet 25 mg  25 mg Oral TID PRN Rozetta Nunnery, NP   25 mg at 03/09/18 2225  . magnesium hydroxide (MILK OF MAGNESIA) suspension 30 mL  30 mL Oral Daily PRN Lindon Romp A, NP      . nicotine (NICODERM CQ - dosed in mg/24 hours) patch 21 mg  21 mg Transdermal Daily Sharma Covert, MD   21 mg at 03/10/18 0826  . pneumococcal 23 valent vaccine (PNU-IMMUNE) injection 0.5 mL  0.5 mL Intramuscular Tomorrow-1000 Sharma Covert, MD      . traZODone (DESYREL) tablet 50 mg  50 mg Oral QHS,MR X 1 Lindon Romp A, NP   50 mg at 03/09/18 2225   PTA Medications: No medications prior to admission.    Musculoskeletal: Strength & Muscle Tone: within normal limits Gait & Station: normal Patient leans: N/A  Psychiatric Specialty Exam: Physical Exam  ROS  Blood pressure 97/73, pulse 96, temperature 98.1 F (36.7 C), temperature source Oral, resp. rate 18, height 5' 8" (1.727 m), weight 81.2 kg (179 lb).Body mass index is 27.22 kg/m.  General Appearance: Disheveled  Eye Contact:  Minimal  Speech:  Slow  Volume:  Decreased  Mood:  Depressed  Affect:  Congruent  Thought Process:  Disorganized  Orientation:  Full (Time, Place, and Person)  Thought Content:  Logical  Suicidal Thoughts:  Yes.  without intent/plan  Homicidal Thoughts:  No  Memory:  Immediate;   Fair  Judgement:  Impaired  Insight:  Lacking  Psychomotor Activity:  Increased  Concentration:  Concentration: Fair  Recall:  Poor  Fund of Knowledge:  Fair  Language:  Good  Akathisia:  No  Handed:  Right  AIMS (if indicated):     Assets:  Desire for Improvement  ADL's:  Intact  Cognition:  WNL  Sleep:  Number of Hours: 6.75    Treatment Plan Summary: Daily contact with patient to assess and evaluate symptoms and progress in treatment, Medication management and Plan Patient is seen and examined.  Patient is a 37 year old male with a history of depression, anxiety and cannabis use disorder.  He is been off  his psychiatric medications over the last 3 months, and as well is recently found out he is being evicted.  He did some superficial injury to wrist bilaterally.  We will admit him into the hospital.  We will integrate him into the milieu.  He will be watched every 15 minutes.  He will be encouraged to attend groups.  Social work will be involved with him especially with regard to housing.  We will attempt to get some old records from [facility] with regard to why his medications were changed.  Hopefully we will be able to get him doing better in the fairly rapid manner.  Observation Level/Precautions:  15 minute checks  Laboratory:  Chemistry Profile  Psychotherapy:    Medications:    Consultations:    Discharge Concerns:    Estimated LOS:  Other:     Physician Treatment Plan for Primary Diagnosis: <principal problem not specified> Long Term Goal(s): Improvement in  symptoms so as ready for discharge  Short Term Goals: Ability to identify changes in lifestyle to reduce recurrence of condition will improve, Ability to verbalize feelings will improve, Ability to disclose and discuss suicidal ideas, Ability to demonstrate self-control will improve, Ability to identify and develop effective coping behaviors will improve, Ability to maintain clinical measurements within normal limits will improve, Compliance with prescribed medications will improve and Ability to identify triggers associated with substance abuse/mental health issues will improve  Physician Treatment Plan for Secondary Diagnosis: Active Problems:   Severe recurrent major depression without psychotic features ([place])  Long Term Goal(s): Improvement in symptoms so as ready for discharge  Short Term Goals: Ability to identify changes in lifestyle to reduce recurrence of condition will improve, Ability to verbalize feelings will improve, Ability to disclose and discuss suicidal ideas, Ability to demonstrate self-control will improve, Ability  to identify and develop effective coping behaviors will improve, Ability to maintain clinical measurements within normal limits will improve, Compliance with prescribed medications will improve and Ability to identify triggers associated with substance abuse/mental health issues will improve  I certify that inpatient services furnished can reasonably be expected to improve the patient's condition.    Sharma Covert, MD 4/14/201911:51 AM

## 2018-03-10 NOTE — [facility] Group Notes (Addendum)
Identifying Needs   Date:  03/10/2018  Time:  2:17 PM  Type of Therapy:  Nurse EducationThe group focuses on teaching patients how to identify their needs and then how ot develop the skills needed to get those needs met in a healthier way.  Participation Level:  Pt did not attend.  Participation Quality:    Affect:    Cognitive:  Alert  Insight:    Engagement in Group:   Modes of Intervention:    Summary of Progress/Problems:  Lauralyn Primes 03/10/2018, 2:17 PM

## 2018-03-11 DIAGNOSIS — K219 Gastro-esophageal reflux disease without esophagitis: Secondary | ICD-10-CM

## 2018-03-11 DIAGNOSIS — G4700 Insomnia, unspecified: Secondary | ICD-10-CM

## 2018-03-11 DIAGNOSIS — R12 Heartburn: Secondary | ICD-10-CM

## 2018-03-11 DIAGNOSIS — R450 Nervousness: Secondary | ICD-10-CM

## 2018-03-11 MED ORDER — FAMOTIDINE 20 MG PO TABS
20.0000 mg | ORAL_TABLET | Freq: Two times a day (BID) | ORAL | Status: DC
  Administered 2018-03-11 – 2018-03-15 (×8): 20 mg via ORAL
  Filled 2018-03-11 (×12): qty 1

## 2018-03-11 MED ORDER — TRAZODONE HCL 100 MG PO TABS
100.0000 mg | ORAL_TABLET | Freq: Every evening | ORAL | Status: DC | PRN
  Administered 2018-03-11 – 2018-03-12 (×2): 100 mg via ORAL
  Filled 2018-03-11 (×8): qty 1

## 2018-03-11 NOTE — Progress Notes (Signed)
Adult Psychoeducational Group Note  Date:  03/11/2018 Time:  1:37 PM  Group Topic/Focus:  Overcoming Stress:   The focus of this group is to define stress and help patients assess their triggers.  Participation Level:  None  Participation Quality:  Drowsy  Affect:  Lethargic  Cognitive:  Lacking  Insight: None  Engagement in Group:  None  Modes of Intervention:  Discussion  Additional Comments:  Pt came to group but did not participate.  Pt slept in group and didn't say anything   Maude LericheLeonard B Louis Ivery 03/11/2018, 1:37 PM

## 2018-03-11 NOTE — Progress Notes (Signed)
Pt presents with a flat affect and depressed mood. Pt rates depression 7/10. Anxiety 8/10. Pt noted to be isolative to his room and withdrawn. Pt stated goal is to not have thoughts of cutting. Pt denies SI or self harm behaviors. Pt verbally contracts for safety. Pt reports poor sleep last night due to racing thoughts and nightmares. Tx team made aware. Orders reviewed with pt. Verbal support provided. Cuts to bilat arms cleanse, ointment applied and open to air. 15 minute checks performed for safety. Pt compliant with tx.

## 2018-03-11 NOTE — Progress Notes (Signed)
Pt refused to attend wrap up group 

## 2018-03-11 NOTE — Plan of Care (Signed)
  Problem: Health Behavior/Discharge Planning: Goal: Compliance with treatment plan for underlying cause of condition will improve Outcome: Progressing   Problem: Safety: Goal: Periods of time without injury will increase Outcome: Progressing   Problem: Health Behavior/Discharge Planning: Goal: Compliance with therapeutic regimen will improve Outcome: Progressing   Problem: Activity: Goal: Sleeping patterns will improve Outcome: Not Progressing

## 2018-03-11 NOTE — Tx Team (Signed)
Interdisciplinary Treatment and Diagnostic Plan Update  03/11/2018 Time of Session: 5:14 PM  Angel [name] MRN: [id number]  Principal Diagnosis: Severe recurrent major depression without psychotic features [facility])  Secondary Diagnoses: Principal Problem:   Severe recurrent major depression without psychotic features ([place]) Active Problems:   GERD (gastroesophageal reflux disease)   Current Medications:  Current Facility-Administered Medications  Medication Dose Route Frequency Provider Last Rate Last Dose  . acetaminophen (TYLENOL) tablet 650 mg  650 mg Oral Q6H PRN Lindon Romp A, NP      . alum & mag hydroxide-simeth (MAALOX/MYLANTA) 200-200-20 MG/5ML suspension 30 mL  30 mL Oral Q4H PRN Lindon Romp A, NP      . ARIPiprazole (ABILIFY) tablet 5 mg  5 mg Oral QHS Sharma Covert, MD   5 mg at 03/10/18 2136  . famotidine (PEPCID) tablet 20 mg  20 mg Oral BID Money, Lowry Ram, FNP   20 mg at 03/11/18 1613  . FLUoxetine (PROZAC) capsule 20 mg  20 mg Oral Daily Sharma Covert, MD   20 mg at 03/11/18 0805  . hydrOXYzine (ATARAX/VISTARIL) tablet 25 mg  25 mg Oral TID PRN Lindon Romp A, NP   25 mg at 03/10/18 1452  . magnesium hydroxide (MILK OF MAGNESIA) suspension 30 mL  30 mL Oral Daily PRN Lindon Romp A, NP      . neomycin-bacitracin-polymyxin (NEOSPORIN) ointment   Topical BID Sharma Covert, MD      . nicotine (NICODERM CQ - dosed in mg/24 hours) patch 21 mg  21 mg Transdermal Daily Sharma Covert, MD   21 mg at 03/11/18 0806  . pneumococcal 23 valent vaccine (PNU-IMMUNE) injection 0.5 mL  0.5 mL Intramuscular Tomorrow-1000 Sharma Covert, MD      . traZODone (DESYREL) tablet 100 mg  100 mg Oral QHS,MR X 1 Money, Darnelle Maffucci B, FNP        PTA Medications: No medications prior to admission.    Patient Stressors: Financial difficulties Medication change or noncompliance  Patient Strengths: Ability for insight Average or above average intelligence Capable of  independent living General fund of knowledge  Treatment Modalities: Medication Management, Group therapy, Case management,  1 to 1 session with clinician, Psychoeducation, Recreational therapy.   Physician Treatment Plan for Primary Diagnosis: Severe recurrent major depression without psychotic features ([place]) Long Term Goal(s): Improvement in symptoms so as ready for discharge  Short Term Goals: Ability to identify changes in lifestyle to reduce recurrence of condition will improve Ability to verbalize feelings will improve Ability to disclose and discuss suicidal ideas Ability to demonstrate self-control will improve Ability to identify and develop effective coping behaviors will improve Ability to maintain clinical measurements within normal limits will improve Compliance with prescribed medications will improve Ability to identify triggers associated with substance abuse/mental health issues will improve Ability to identify changes in lifestyle to reduce recurrence of condition will improve Ability to verbalize feelings will improve Ability to disclose and discuss suicidal ideas Ability to demonstrate self-control will improve Ability to identify and develop effective coping behaviors will improve Ability to maintain clinical measurements within normal limits will improve Compliance with prescribed medications will improve Ability to identify triggers associated with substance abuse/mental health issues will improve  Medication Management: Evaluate patient's response, side effects, and tolerance of medication regimen.  Therapeutic Interventions: 1 to 1 sessions, Unit Group sessions and Medication administration.  Evaluation of Outcomes: Progressing  Physician Treatment Plan for Secondary Diagnosis: Principal Problem:   Severe  recurrent major depression without psychotic features ([place]) Active Problems:   GERD (gastroesophageal reflux disease)   Long Term Goal(s): Improvement  in symptoms so as ready for discharge  Short Term Goals: Ability to identify changes in lifestyle to reduce recurrence of condition will improve Ability to verbalize feelings will improve Ability to disclose and discuss suicidal ideas Ability to demonstrate self-control will improve Ability to identify and develop effective coping behaviors will improve Ability to maintain clinical measurements within normal limits will improve Compliance with prescribed medications will improve Ability to identify triggers associated with substance abuse/mental health issues will improve Ability to identify changes in lifestyle to reduce recurrence of condition will improve Ability to verbalize feelings will improve Ability to disclose and discuss suicidal ideas Ability to demonstrate self-control will improve Ability to identify and develop effective coping behaviors will improve Ability to maintain clinical measurements within normal limits will improve Compliance with prescribed medications will improve Ability to identify triggers associated with substance abuse/mental health issues will improve  Medication Management: Evaluate patient's response, side effects, and tolerance of medication regimen.  Therapeutic Interventions: 1 to 1 sessions, Unit Group sessions and Medication administration.  Evaluation of Outcomes: Progressing   RN Treatment Plan for Primary Diagnosis: Severe recurrent major depression without psychotic features ([place]) Long Term Goal(s): Knowledge of disease and therapeutic regimen to maintain health will improve  Short Term Goals: Ability to identify and develop effective coping behaviors will improve and Compliance with prescribed medications will improve  Medication Management: RN will administer medications as ordered by provider, will assess and evaluate patient's response and provide education to patient for prescribed medication. RN will report any adverse and/or side  effects to prescribing provider.  Therapeutic Interventions: 1 on 1 counseling sessions, Psychoeducation, Medication administration, Evaluate responses to treatment, Monitor vital signs and CBGs as ordered, Perform/monitor CIWA, COWS, AIMS and Fall Risk screenings as ordered, Perform wound care treatments as ordered.  Evaluation of Outcomes: Progressing   LCSW Treatment Plan for Primary Diagnosis: Severe recurrent major depression without psychotic features ([place]) Long Term Goal(s): Safe transition to appropriate next level of care at discharge, Engage patient in therapeutic group addressing interpersonal concerns.  Short Term Goals: Engage patient in aftercare planning with referrals and resources  Therapeutic Interventions: Assess for all discharge needs, 1 to 1 time with Social worker, Explore available resources and support systems, Assess for adequacy in community support network, Educate family and significant other(s) on suicide prevention, Complete Psychosocial Assessment, Interpersonal group therapy.  Evaluation of Outcomes: Met  Return home, follow up outpt   Progress in Treatment: Attending groups: Yes Participating in groups: Yes Taking medication as prescribed: Yes Toleration medication: Yes, no side effects reported at this time Family/Significant other contact made: No Patient understands diagnosis: Yes AEB asking for help with self mutilation Discussing patient identified problems/goals with staff: Yes Medical problems stabilized or resolved: Yes Denies suicidal/homicidal ideation: Yes Issues/concerns per patient self-inventory: None Other: N/A  New problem(s) identified: None identified at this time.   New Short Term/Long Term Goal(s): "I got a lot on my mind.  I want to try to get better and not cut myself no more."   Discharge Plan or Barriers:   Reason for Continuation of Hospitalization:  Depression  Medication stabilization Suicidal  ideation   Estimated Length of Stay: 4/19  Attendees: Patient: Angel [name] 03/11/2018  5:14 PM  Physician: Myles Lipps, MD 03/11/2018  5:14 PM  Nursing: Darrol Angel, RN 03/11/2018  5:14 PM  RN Care  Manager: Lars Pinks, RN 03/11/2018  5:14 PM  Social Worker: Ripley Fraise 03/11/2018  5:14 PM  Recreational Therapist: Winfield Cunas 03/11/2018  5:14 PM  Other: Norberto Sorenson 03/11/2018  5:14 PM  Other:  03/11/2018  5:14 PM    Scribe for Treatment Team:  Roque Lias LCSW 03/11/2018 5:14 PM

## 2018-03-11 NOTE — Progress Notes (Signed)
Recreation Therapy Notes  Date: 4.15.19 Time: 9:30 a.m. Location: [address] Dayroom   Group Topic: Stress Management   Goal Area(s) Addresses:  Goal 1.1: To reduce stress  -Patient will feel a reduction in stress level  -Patient will understand the importance of stress management  -Patient will participate during stress management group      Intervention: Stress Management  Activity: Guided Imagery: Patients were in a peaceful environment with soft lighting enhancing patients mood. Patients listened to a guided imagery script read by Recreation Therapy Intern.  Education: Stress Management, Discharge Planning.    Education Outcome: Acknowledges edcuation/In group clarification offered/Needs additional education   Clinical Observations/Feedback:: Patient did not attend     Sheryle HailDarian Chaynce Schafer, Recreation Therapy Intern   Sheryle HailDarian Halia Franey 03/11/2018 9:13 AM

## 2018-03-11 NOTE — [facility] Group Notes (Signed)
Pt was invited but did not attend psycho-educational group.  

## 2018-03-11 NOTE — Progress Notes (Signed)
Adult Psychoeducational Group Note  Date:  03/11/2018 Time:  9:42 PM  Group Topic/Focus:  Wrap-Up Group:   The focus of this group is to help patients review their daily goal of treatment and discuss progress on daily workbooks.  Participation Level:  Active  Participation Quality:  Appropriate  Affect:  Appropriate  Cognitive:  Alert  Insight: Appropriate  Engagement in Group:  Engaged  Modes of Intervention:  Discussion  Additional Comments:  Pt stated that he did not have a good day. His goal was not to sleep much during the day, but he wasn't able to achieve his goal.   Flonnie HailstoneCOOKE, Quilla Freeze R 03/11/2018, 9:42 PM

## 2018-03-11 NOTE — [facility] Group Notes (Signed)
Adult Psychoeducational Group Note  Date:  03/11/2018 Time:  4:54 PM  Group Topic/Focus:  Wellness Toolbox:   The focus of this group is to discuss various aspects of wellness, balancing those aspects and exploring ways to increase the ability to experience wellness.  Patients will create a wellness toolbox for use upon discharge.  Participation Level:  Active  Participation Quality:  Appropriate  Affect:  Appropriate  Cognitive:  Appropriate  Insight: Appropriate  Engagement in Group:  Engaged  Modes of Intervention:  Discussion  Additional Comments:  Pt attended and participated in psycho-educational group.  Dellia NimsJaquesha M Jazmine Heckman 03/11/2018, 4:54 PM

## 2018-03-11 NOTE — Progress Notes (Signed)
[facility] MD Progress Note  03/11/2018 1:04 PM Angel [name]  MRN:  [id number]   Subjective: Patient reports today that he is still feeling very depressed.  He states that he slept okay but has not been wanting to lay on the bed most of the day.  He states that his medications worked in the past but he is only and I will follow him for 2-3 months.  He denies any SI/HI/AVH and contracts for safety.  He reports that his sleep has been disrupted.  He also reports that he has had some upset stomach after meals and that he has had a history of severe indigestion but has never been treated for it.  Objective: Patient's chart and findings reviewed and discussed with treatment team.  Patient presents in his room lying in his bed.  He has been isolative and not attending groups or interacting with peers or staff.  He is pleasant and cooperative today.  Due to disrupted sleep we will increase trazodone to 100 mg nightly as needed.  I will also start patient on Pepcid 20 mg p.o. twice daily for his acid reflux.  Principal Problem: Severe recurrent major depression without psychotic features ([name]) Diagnosis:   Patient Active Problem List   Diagnosis Date Noted  . Severe recurrent major depression without psychotic features ([name]) [F33.2] 03/09/2018  . Major depressive disorder, recurrent episode ([name]) [F33.9] 05/16/2017  . Tobacco use disorder [F17.200] 07/17/2015  . Headache syndrome [G44.89] 07/17/2015  . Nicotine dependence [F17.200] 07/17/2015  . Generalized anxiety disorder [F41.1] 11/01/2013  . Major depressive disorder, recurrent episode, severe, without mention of psychotic behavior [F33.2] 10/31/2013   Total Time spent with patient: 30 minutes  Past Psychiatric History: See H&P  Past Medical History:  Past Medical History:  Diagnosis Date  . Anxiety   . Depression   . Migraine    History reviewed. No pertinent surgical history. Family History:  Family History  Problem Relation Age of Onset   . Cancer Mother        brain  . Diabetes Brother   . Hypertension Brother    Family Psychiatric  History: See H&P Social History:  Social History   Substance and Sexual Activity  Alcohol Use No     Social History   Substance and Sexual Activity  Drug Use No    Social History   Socioeconomic History  . Marital status: Single    Spouse name: Not on file  . Number of children: Not on file  . Years of education: Not on file  . Highest education level: Not on file  Occupational History  . Not on file  Social Needs  . Financial resource strain: Not on file  . Food insecurity:    Worry: Not on file    Inability: Not on file  . Transportation needs:    Medical: Not on file    Non-medical: Not on file  Tobacco Use  . Smoking status: Current Every Day Smoker    Packs/day: 1.50    Years: 20.00    Pack years: 30.00    Types: Cigarettes  . Smokeless tobacco: Never Used  Substance and Sexual Activity  . Alcohol use: No  . Drug use: No  . Sexual activity: Not on file  Lifestyle  . Physical activity:    Days per week: Not on file    Minutes per session: Not on file  . Stress: Not on file  Relationships  . Social connections:  Talks on phone: Not on file    Gets together: Not on file    Attends religious service: Not on file    Active member of club or organization: Not on file    Attends meetings of clubs or organizations: Not on file    Relationship status: Not on file  Other Topics Concern  . Not on file  Social History Narrative  . Not on file   Additional Social History:                         Sleep: Fair  Appetite:  Fair  Current Medications: Current Facility-Administered Medications  Medication Dose Route Frequency Provider Last Rate Last Dose  . acetaminophen (TYLENOL) tablet 650 mg  650 mg Oral Q6H PRN Lindon Romp A, NP      . alum & mag hydroxide-simeth (MAALOX/MYLANTA) 200-200-20 MG/5ML suspension 30 mL  30 mL Oral Q4H PRN Lindon Romp A, NP      . ARIPiprazole (ABILIFY) tablet 5 mg  5 mg Oral QHS Sharma Covert, MD   5 mg at 03/10/18 2136  . famotidine (PEPCID) tablet 20 mg  20 mg Oral BID Lucilia Yanni, Lowry Ram, FNP      . FLUoxetine (PROZAC) capsule 20 mg  20 mg Oral Daily Sharma Covert, MD   20 mg at 03/11/18 0805  . hydrOXYzine (ATARAX/VISTARIL) tablet 25 mg  25 mg Oral TID PRN Lindon Romp A, NP   25 mg at 03/10/18 1452  . magnesium hydroxide (MILK OF MAGNESIA) suspension 30 mL  30 mL Oral Daily PRN Lindon Romp A, NP      . neomycin-bacitracin-polymyxin (NEOSPORIN) ointment   Topical BID Sharma Covert, MD      . nicotine (NICODERM CQ - dosed in mg/24 hours) patch 21 mg  21 mg Transdermal Daily Sharma Covert, MD   21 mg at 03/11/18 0806  . pneumococcal 23 valent vaccine (PNU-IMMUNE) injection 0.5 mL  0.5 mL Intramuscular Tomorrow-1000 Sharma Covert, MD      . traZODone (DESYREL) tablet 100 mg  100 mg Oral QHS,MR X 1 Zaryia Markel, Lowry Ram, FNP        Lab Results:  Results for orders placed or performed during the hospital encounter of 03/09/18 (from the past 48 hour(s))  Rapid urine drug screen (hospital performed)     Status: Abnormal   Collection Time: 03/09/18  5:42 PM  Result Value Ref Range   Opiates NONE DETECTED NONE DETECTED   Cocaine NONE DETECTED NONE DETECTED   Benzodiazepines NONE DETECTED NONE DETECTED   Amphetamines NONE DETECTED NONE DETECTED   Tetrahydrocannabinol POSITIVE (A) NONE DETECTED   Barbiturates NONE DETECTED NONE DETECTED    Comment: (NOTE) DRUG SCREEN FOR MEDICAL PURPOSES ONLY.  IF CONFIRMATION IS NEEDED FOR ANY PURPOSE, NOTIFY LAB WITHIN 5 DAYS. LOWEST DETECTABLE LIMITS FOR URINE DRUG SCREEN Drug Class                     Cutoff (ng/mL) Amphetamine and metabolites    1000 Barbiturate and metabolites    200 Benzodiazepine                 952 Tricyclics and metabolites     300 Opiates and metabolites        300 Cocaine and metabolites        300 THC  3 Performed at [facility], [address], [place], [place] [address]   CBC with Differential/Platelet     Status: None   Collection Time: 03/09/18  5:56 PM  Result Value Ref Range   WBC 6.3 4.0 - 10.5 K/uL   RBC 4.97 4.22 - 5.81 MIL/uL   Hemoglobin 14.4 13.0 - 17.0 g/dL   HCT 42.4 39.0 - 52.0 %   MCV 85.3 78.0 - 100.0 fL   MCH 29.0 26.0 - 34.0 pg   MCHC 34.0 30.0 - 36.0 g/dL   RDW 13.3 11.5 - 15.5 %   Platelets 211 150 - 400 K/uL   Neutrophils Relative % 48 %   Neutro Abs 3.1 1.7 - 7.7 K/uL   Lymphocytes Relative 40 %   Lymphs Abs 2.5 0.7 - 4.0 K/uL   Monocytes Relative 9 %   Monocytes Absolute 0.5 0.1 - 1.0 K/uL   Eosinophils Relative 2 %   Eosinophils Absolute 0.1 0.0 - 0.7 K/uL   Basophils Relative 1 %   Basophils Absolute 0.0 0.0 - 0.1 K/uL    Comment: Performed at [facility], [address], [place], [place] [address]  Comprehensive metabolic panel     Status: Abnormal   Collection Time: 03/09/18  5:56 PM  Result Value Ref Range   Sodium 137 135 - 145 mmol/L   Potassium 3.4 (L) 3.5 - 5.1 mmol/L   Chloride 102 101 - 111 mmol/L   CO2 25 22 - 32 mmol/L   Glucose, Bld 93 65 - 99 mg/dL   BUN 8 6 - 20 mg/dL   Creatinine, Ser 0.73 0.61 - 1.24 mg/dL   Calcium 9.1 8.9 - 10.3 mg/dL   Total Protein 7.0 6.5 - 8.1 g/dL   Albumin 4.2 3.5 - 5.0 g/dL   AST 17 15 - 41 U/L   ALT 20 17 - 63 U/L   Alkaline Phosphatase 51 38 - 126 U/L   Total Bilirubin 0.5 0.3 - 1.2 mg/dL   GFR calc non Af Amer >60 >60 mL/min   GFR calc Af Amer >60 >60 mL/min    Comment: (NOTE) The eGFR has been calculated using the CKD EPI equation. This calculation has not been validated in all clinical situations. eGFR's persistently <60 mL/min signify possible Chronic Kidney Disease.    Anion gap 10 5 - 15    Comment: Performed at [facility], [address], [place], [place] [address]  Ethanol     Status: None   Collection Time: 03/09/18  5:57 PM  Result Value Ref Range   Alcohol, Ethyl  (B) <10 <10 mg/dL    Comment: Performed at [facility], [address], [place], [place] [address]    Blood Alcohol level:  Lab Results  Component Value Date   [facility] <10 03/09/2018   ETH <5 09/98/3382    Metabolic Disorder Labs: Lab Results  Component Value Date   HGBA1C 5.3 05/17/2017   MPG 105 05/17/2017   Lab Results  Component Value Date   PROLACTIN 17.3 (H) 05/17/2017   Lab Results  Component Value Date   CHOL 202 (H) 05/17/2017   TRIG 165 (H) 05/17/2017   HDL 41 05/17/2017   CHOLHDL 4.9 05/17/2017   VLDL 33 05/17/2017   LDLCALC 128 (H) 05/17/2017    Physical Findings: AIMS: Facial and Oral Movements Muscles of Facial Expression: None, normal Lips and Perioral Area: None, normal Jaw: None, normal Tongue: None, normal,Extremity Movements Upper (arms, wrists, hands, fingers): None, normal Lower (legs,  knees, ankles, toes): None, normal, Trunk Movements Neck, shoulders, hips: None, normal, Overall Severity Severity of abnormal movements (highest score from questions above): None, normal Incapacitation due to abnormal movements: None, normal Patient's awareness of abnormal movements (rate only patient's report): No Awareness, Dental Status Current problems with teeth and/or dentures?: No Does patient usually wear dentures?: No  CIWA:    COWS:     Musculoskeletal: Strength & Muscle Tone: within normal limits Gait & Station: normal Patient leans: N/A  Psychiatric Specialty Exam: Physical Exam  Nursing note and vitals reviewed. Constitutional: He is oriented to person, place, and time. He appears well-developed and well-nourished.  Respiratory: Effort normal.  Musculoskeletal: Normal range of motion.  Neurological: He is alert and oriented to person, place, and time.  Skin: Skin is warm.    Review of Systems  Constitutional: Negative.   HENT: Negative.   Eyes: Negative.   Respiratory: Negative.   Cardiovascular: Negative.   Gastrointestinal: Positive  for heartburn.  Genitourinary: Negative.   Musculoskeletal: Negative.   Skin: Negative.   Neurological: Negative.   Endo/Heme/Allergies: Negative.   Psychiatric/Behavioral: Positive for depression. Negative for hallucinations and suicidal ideas. The patient is nervous/anxious.     Blood pressure 102/74, pulse (!) 107, temperature 98.2 F (36.8 C), temperature source Oral, resp. rate 18, height 5' 8" (1.727 m), weight 81.2 kg (179 lb).Body mass index is 27.22 kg/m.  General Appearance: Disheveled  Eye Contact:  Good  Speech:  Clear and Coherent and Normal Rate  Volume:  Normal  Mood:  Depressed  Affect:  Depressed and Flat  Thought Process:  Linear and Descriptions of Associations: Intact  Orientation:  Full (Time, Place, and Person)  Thought Content:  WDL  Suicidal Thoughts:  No  Homicidal Thoughts:  No  Memory:  Immediate;   Good Recent;   Good Remote;   Good  Judgement:  Fair  Insight:  Fair  Psychomotor Activity:  Normal  Concentration:  Concentration: Good and Attention Span: Good  Recall:  Good  Fund of Knowledge:  Good  Language:  Good  Akathisia:  No  Handed:  Right  AIMS (if indicated):     Assets:  Communication Skills Desire for Improvement Social Support  ADL's:  Intact  Cognition:  WNL  Sleep:  Number of Hours: 6.75   Problems addressed MDD severe GERD  Treatment Plan Summary: Daily contact with patient to assess and evaluate symptoms and progress in treatment, Medication management and Plan is to:  -Start Pepcid 20 mg p.o. twice daily for GERD -Increase trazodone 100 mg p.o. nightly as needed for insomnia -Continue Abilify 5 mg p.o. nightly for mood stability -Continue Prozac 20 mg p.o. daily for mood stability -Continue Vistaril 25 mg p.o. 3 times daily as needed -Encourage group therapy participation  Lewis Shock, FNP 03/11/2018, 1:04 PM

## 2018-03-12 NOTE — Progress Notes (Signed)
Patient ID: Angel DoctorMichael R Priola, male   DOB: 06/06/1981, 37 y.o.   MRN: [id number] DAR Note: Pt observed pacing up and down in his room. Pt complained of severe anxiety and moderate depression, "I feel very anxious right now.".see MAR. Pt denied SI/HI, pain and AVH; "my main problem is to get my anxiety under control." Pt was med compliant. All Pt's questions and concerns addressed. Support, encouragement, and safe environment provided. Pt attended wrap-up group.

## 2018-03-12 NOTE — Progress Notes (Signed)
Recreation Therapy Notes  Animal-Assisted Activity (AAA) Program Checklist/Progress Notes Patient Eligibility Criteria Checklist & Daily Group note for Rec Tx Intervention  Date: 4.16.19 Time: 2:45 p.m. Location: [address] Dayroom   AAA/T Program Assumption of Risk Form signed by Patient/ or Parent Legal Guardian Yes  Patient is free of allergies or sever asthma Yes  Patient reports no fear of animals Yes  Patient reports no history of cruelty to animals Yes  Patient understands his/her participation is voluntary Yes  Patient washes hands before animal contact Yes  Patient washes hands after animal contact Yes  Behavioral Response: Appropriate   Education: Hand Washing, Appropriate Animal Interaction   Education Outcome: Acknowledges Education.   Clinical Observations/Feedback: Patient attended session and interacted appropriately with therapy dog and peers   Angel [name], Recreation Therapy Intern   Angel [name] 03/12/2018 2:41 PM 

## 2018-03-12 NOTE — [facility] Group Notes (Signed)
LCSW Group Therapy Note 03/12/2018 3:58 PM  Type of Therapy/Topic: Group Therapy: Balance in Life  Participation Level: Active  Description of Group:  This group will address the concept of balance and how it feels and looks when one is unbalanced. Patients will be encouraged to process areas in their lives that are out of balance and identify reasons for remaining unbalanced. Facilitators will guide patients in utilizing problem-solving interventions to address and correct the stressor making their life unbalanced. Understanding and applying boundaries will be explored and addressed for obtaining and maintaining a balanced life. Patients will be encouraged to explore ways to assertively make their unbalanced needs known to significant others in their lives, using other group members and facilitator for support and feedback.  Therapeutic Goals: 1. Patient will identify two or more emotions or situations they have that consume much of in their lives. 2. Patient will identify signs/triggers that life has become out of balance:  3. Patient will identify two ways to set boundaries in order to achieve balance in their lives:  4. Patient will demonstrate ability to communicate their needs through discussion and/or role plays  Summary of Patient Progress:  [name] was engaged and participated throughout the group session. [name] reports that balance is "trying to be happy and not sad". [name] states that negativity is what causes him to become "unbalanced". [name] reports that he plans to participated in group therapy to help him learn how to regain and maintain balance in life.    Therapeutic Modalities:  Cognitive Behavioral Therapy Solution-Focused Therapy Assertiveness Training   Alcario DroughtJolan Kynlei Piontek LCSWA Clinical Social Worker

## 2018-03-12 NOTE — [facility] Group Notes (Signed)
Adult Psychoeducational Group Note  Date:  03/12/2018 Time:  10:15 AM  Group Topic/Focus:  Goals Group:   The focus of this group is to help patients establish daily goals to achieve during treatment and discuss how the patient can incorporate goal setting into their daily lives to aide in recovery.  Participation Level:  Active  Participation Quality:  Appropriate  Affect:  Appropriate  Cognitive:  Appropriate  Insight: Appropriate  Engagement in Group:  Engaged  Modes of Intervention:  Orientation  Additional Comments:   Pt attended and participated in orientation/goals group. Pt goal for today to go to all groups.  Dellia NimsJaquesha M Montrelle Eddings 03/12/2018, 10:15 AM

## 2018-03-12 NOTE — Progress Notes (Signed)
DAR NOTE: Patient presents with anxious affect and depressed mood. Pt has been isolating himself a lot in the room. Pt has not been interacting much.  Denies pain, auditory and visual hallucinations. Reports fair sleep, fair appetite, normal energy, and poor concentration.  Rates depression at 7, hopelessness at 7, and anxiety at 8.  Maintained on routine safety checks.  Medications given as prescribed.  Support and encouragement offered as needed. Will continue to monitor.

## 2018-03-12 NOTE — Progress Notes (Signed)
[facility] MD Progress Note  03/12/2018 4:31 PM Angel [name]  MRN:  [id number]   Subjective: Patient reports still feeling depressed denies suicidal, ideations at this time and contracts for safety on unit.   Objective: I have discussed case with treatment team and have met with patient.  He is a 37 year old male, with a history of depression, anxiety.  Presented due to worsening depression, suicidal ideations, self-inflicted cuts.  He reports significant psychosocial stressors/facing eviction. He had been off his psychiatric medications prior to admission. As above reports ongoing depression and anxiety currently denies suicidal ideations and is able to contract for safety on unit. At this time on Abilify and Fluoxetine. Presents anxious but cooperative on approach.  Principal Problem: Severe recurrent major depression without psychotic features ([place]) Diagnosis:   Patient Active Problem List   Diagnosis Date Noted  . GERD (gastroesophageal reflux disease) [K21.9] 03/11/2018  . Severe recurrent major depression without psychotic features (Angel [name]) [F33.2] 03/09/2018  . Major depressive disorder, recurrent episode (Angel [name]) [F33.9] 05/16/2017  . Tobacco use disorder [F17.200] 07/17/2015  . Headache syndrome [G44.89] 07/17/2015  . Nicotine dependence [F17.200] 07/17/2015  . Generalized anxiety disorder [F41.1] 11/01/2013  . Major depressive disorder, recurrent episode, severe, without mention of psychotic behavior [F33.2] 10/31/2013   Total Time spent with patient: 20 minutes  Past Psychiatric History: See H&P  Past Medical History:  Past Medical History:  Diagnosis Date  . Anxiety   . Depression   . Migraine    History reviewed. No pertinent surgical history. Family History:  Family History  Problem Relation Age of Onset  . Cancer Mother        brain  . Diabetes Brother   . Hypertension Brother    Family Psychiatric  History: See H&P Social History:  Social History   Substance and  Sexual Activity  Alcohol Use No     Social History   Substance and Sexual Activity  Drug Use No    Social History   Socioeconomic History  . Marital status: Single    Spouse name: Not on file  . Number of children: Not on file  . Years of education: Not on file  . Highest education level: Not on file  Occupational History  . Not on file  Social Needs  . Financial resource strain: Not on file  . Food insecurity:    Worry: Not on file    Inability: Not on file  . Transportation needs:    Medical: Not on file    Non-medical: Not on file  Tobacco Use  . Smoking status: Current Every Day Smoker    Packs/day: 1.50    Years: 20.00    Pack years: 30.00    Types: Cigarettes  . Smokeless tobacco: Never Used  Substance and Sexual Activity  . Alcohol use: No  . Drug use: No  . Sexual activity: Not on file  Lifestyle  . Physical activity:    Days per week: Not on file    Minutes per session: Not on file  . Stress: Not on file  Relationships  . Social connections:    Talks on phone: Not on file    Gets together: Not on file    Attends religious service: Not on file    Active member of club or organization: Not on file    Attends meetings of clubs or organizations: Not on file    Relationship status: Not on file  Other Topics Concern  . Not on file  Social History Narrative  . Not on file   Additional Social History:   Sleep: Improving  Appetite:  Improving  Current Medications: Current Facility-Administered Medications  Medication Dose Route Frequency Provider Last Rate Last Dose  . acetaminophen (TYLENOL) tablet 650 mg  650 mg Oral Q6H PRN Lindon Romp A, NP      . alum & mag hydroxide-simeth (MAALOX/MYLANTA) 200-200-20 MG/5ML suspension 30 mL  30 mL Oral Q4H PRN Lindon Romp A, NP      . ARIPiprazole (ABILIFY) tablet 5 mg  5 mg Oral QHS Sharma Covert, MD   5 mg at 03/11/18 2100  . famotidine (PEPCID) tablet 20 mg  20 mg Oral BID Money, Lowry Ram, FNP   20 mg  at 03/12/18 4782  . FLUoxetine (PROZAC) capsule 20 mg  20 mg Oral Daily Sharma Covert, MD   20 mg at 03/12/18 9562  . hydrOXYzine (ATARAX/VISTARIL) tablet 25 mg  25 mg Oral TID PRN Lindon Romp A, NP   25 mg at 03/11/18 1953  . magnesium hydroxide (MILK OF MAGNESIA) suspension 30 mL  30 mL Oral Daily PRN Lindon Romp A, NP      . neomycin-bacitracin-polymyxin (NEOSPORIN) ointment   Topical BID Sharma Covert, MD      . nicotine (NICODERM CQ - dosed in mg/24 hours) patch 21 mg  21 mg Transdermal Daily Sharma Covert, MD   21 mg at 03/12/18 0809  . pneumococcal 23 valent vaccine (PNU-IMMUNE) injection 0.5 mL  0.5 mL Intramuscular Tomorrow-1000 Sharma Covert, MD      . traZODone (DESYREL) tablet 100 mg  100 mg Oral QHS,MR X 1 Money, Lowry Ram, FNP   100 mg at 03/11/18 2100    Lab Results:  No results found for this or any previous visit (from the past 48 hour(s)).  Blood Alcohol level:  Lab Results  Component Value Date   ETH <10 03/09/2018   ETH <5 13/06/6577    Metabolic Disorder Labs: Lab Results  Component Value Date   HGBA1C 5.3 05/17/2017   MPG 105 05/17/2017   Lab Results  Component Value Date   PROLACTIN 17.3 (H) 05/17/2017   Lab Results  Component Value Date   CHOL 202 (H) 05/17/2017   TRIG 165 (H) 05/17/2017   HDL 41 05/17/2017   CHOLHDL 4.9 05/17/2017   VLDL 33 05/17/2017   LDLCALC 128 (H) 05/17/2017    Physical Findings: AIMS: Facial and Oral Movements Muscles of Facial Expression: None, normal Lips and Perioral Area: None, normal Jaw: None, normal Tongue: None, normal,Extremity Movements Upper (arms, wrists, hands, fingers): None, normal Lower (legs, knees, ankles, toes): None, normal, Trunk Movements Neck, shoulders, hips: None, normal, Overall Severity Severity of abnormal movements (highest score from questions above): None, normal Incapacitation due to abnormal movements: None, normal Patient's awareness of abnormal movements (rate  only patient's report): No Awareness, Dental Status Current problems with teeth and/or dentures?: No Does patient usually wear dentures?: No  CIWA:    COWS:     Musculoskeletal: Strength & Muscle Tone: within normal limits Gait & Station: normal Patient leans: N/A  Psychiatric Specialty Exam: Physical Exam  Nursing note and vitals reviewed. Constitutional: He is oriented to person, place, and time. He appears well-developed and well-nourished.  Respiratory: Effort normal.  Musculoskeletal: Normal range of motion.  Neurological: He is alert and oriented to person, place, and time.  Skin: Skin is warm.    Review of Systems  Constitutional: Negative.  HENT: Negative.   Eyes: Negative.   Respiratory: Negative.   Cardiovascular: Negative.   Gastrointestinal: Positive for heartburn.  Genitourinary: Negative.   Musculoskeletal: Negative.   Skin: Negative.   Neurological: Negative.   Endo/Heme/Allergies: Negative.   Psychiatric/Behavioral: Positive for depression. Negative for hallucinations and suicidal ideas. The patient is nervous/anxious.   Denies chest pain, no shortness of breath, denies vomiting  Blood pressure (!) 95/59, pulse (!) 120, temperature 97.9 F (36.6 C), temperature source Oral, resp. rate 18, height '5\' 8"'  (1.727 m), weight 81.2 kg (179 lb).Body mass index is 27.22 kg/m.  General Appearance: Fairly Groomed  Eye Contact:  Good  Speech:  Normal Rate  Volume:  Normal  Mood:  Depressed and anxious  Affect:  Constricted and Anxious  Thought Process:  Linear and Descriptions of Associations: Intact  Orientation:  Full (Time, Place, and Person)  Thought Content:  No hallucinations, no delusions expressed, does not appear internally preoccupied  Suicidal Thoughts:  No at this time denies suicidal plan or intention and contracts for safety on unit, denies homicidal ideations  Homicidal Thoughts:  No  Memory:  Immediate;   Good Recent;   Good Remote;   Good   Judgement:  Fair, improving   Insight:  Fair, improving   Psychomotor Activity:  Normal  Concentration:  Concentration: Good and Attention Span: Good  Recall:  Good  Fund of Knowledge:  Good  Language:  Good  Akathisia:  No  Handed:  Right  AIMS (if indicated):     Assets:  Communication Skills Desire for Improvement Social Support  ADL's:  Fair  Cognition:  WNL  Sleep:  Number of Hours: 6.5   Assessment-patient reports ongoing depression and anxiety but acknowledges feeling somewhat better compared to admission and denies suicidal ideations at present.  Currently on Abilify and Prozac-tolerating well thus far.  Treatment Plan Summary: Daily contact with patient to assess and evaluate symptoms and progress in treatment, Medication management and Plan is to:  -Treatment plan reviewed as below today April 16 -Continue  Famotidine 20 mg BID for GERD -Continue Trazodone 100 mg QHS PRNfor insomnia -Continue Abilify 5 mg QHS for mood disorder, antidepressant augmentation -Continue Prozac 20 mg QDAY for depression -Continue Vistaril 25 mg TID PRN for anxiety -Encourage group therapy participation to work on Radiographer, therapeutic and symptom reduction -Treatment team working on Financial trader.   Jenne Campus, MD 03/12/2018, 4:31 PM  Patient ID: Kathlynn Grate, male   DOB: Feb 25, 1981, 37 y.o.   MRN: [id number]

## 2018-03-13 MED ORDER — TRAZODONE HCL 100 MG PO TABS
100.0000 mg | ORAL_TABLET | Freq: Every evening | ORAL | Status: DC | PRN
  Administered 2018-03-13 – 2018-03-14 (×2): 100 mg via ORAL
  Filled 2018-03-13: qty 1
  Filled 2018-03-13: qty 7
  Filled 2018-03-13: qty 1

## 2018-03-13 NOTE — Progress Notes (Signed)
[facility] MD Progress Note  03/13/2018 1:54 PM Angel [name]  MRN:  [id number]   Subjective: patient reports feeling partially better than he did on admission. Currently denies suicidal ideations. Attributes improvement in part to being less concerned about possible eviction. States he has spoken with friend and that " he is taking care of it, I can still live there ". Denies medication side effects.   Objective: I have discussed case with treatment team and have met with patient.   He is a 37 year old male, with a history of depression, anxiety.  Presented due to worsening depression, suicidal ideations, self-inflicted cuts.  Staff reports patient continues to present with a blunted affect, poor grooming , and tends to remain isolative. At this time patient  reports partially improved mood, and acknowledges feeling better than he did prior to admission- as above, attributes this in part to decreased stressors, no longer concerned about possible eviction. He does continue to present with a blunted and somewhat anxious affect . Denies medication side effects. Denies suicidal ideations. Limited participation in milieu, tends to keep to self, no disruptive or agitated behavior.     Principal Problem: Severe recurrent major depression without psychotic features ([place]) Diagnosis:   Patient Active Problem List   Diagnosis Date Noted  . GERD (gastroesophageal reflux disease) [K21.9] 03/11/2018  . Severe recurrent major depression without psychotic features ([place]) [F33.2] 03/09/2018  . Major depressive disorder, recurrent episode ([place]) [F33.9] 05/16/2017  . Tobacco use disorder [F17.200] 07/17/2015  . Headache syndrome [G44.89] 07/17/2015  . Nicotine dependence [F17.200] 07/17/2015  . Generalized anxiety disorder [F41.1] 11/01/2013  . Major depressive disorder, recurrent episode, severe, without mention of psychotic behavior [F33.2] 10/31/2013   Total Time spent with patient: 15 minutes  Past  Psychiatric History: See H&P  Past Medical History:  Past Medical History:  Diagnosis Date  . Anxiety   . Depression   . Migraine    History reviewed. No pertinent surgical history. Family History:  Family History  Problem Relation Age of Onset  . Cancer Mother        brain  . Diabetes Brother   . Hypertension Brother    Family Psychiatric  History: See H&P Social History:  Social History   Substance and Sexual Activity  Alcohol Use No     Social History   Substance and Sexual Activity  Drug Use No    Social History   Socioeconomic History  . Marital status: Single    Spouse name: Not on file  . Number of children: Not on file  . Years of education: Not on file  . Highest education level: Not on file  Occupational History  . Not on file  Social Needs  . Financial resource strain: Not on file  . Food insecurity:    Worry: Not on file    Inability: Not on file  . Transportation needs:    Medical: Not on file    Non-medical: Not on file  Tobacco Use  . Smoking status: Current Every Day Smoker    Packs/day: 1.50    Years: 20.00    Pack years: 30.00    Types: Cigarettes  . Smokeless tobacco: Never Used  Substance and Sexual Activity  . Alcohol use: No  . Drug use: No  . Sexual activity: Not on file  Lifestyle  . Physical activity:    Days per week: Not on file    Minutes per session: Not on file  . Stress: Not on file  Relationships  . Social connections:    Talks on phone: Not on file    Gets together: Not on file    Attends religious service: Not on file    Active member of club or organization: Not on file    Attends meetings of clubs or organizations: Not on file    Relationship status: Not on file  Other Topics Concern  . Not on file  Social History Narrative  . Not on file   Additional Social History:   Sleep: Improving  Appetite:  Improving  Current Medications: Current Facility-Administered Medications  Medication Dose Route  Frequency Provider Last Rate Last Dose  . acetaminophen (TYLENOL) tablet 650 mg  650 mg Oral Q6H PRN Berry, Jason A, NP   650 mg at 03/13/18 0814  . alum & mag hydroxide-simeth (MAALOX/MYLANTA) 200-200-20 MG/5ML suspension 30 mL  30 mL Oral Q4H PRN Berry, Jason A, NP      . ARIPiprazole (ABILIFY) tablet 5 mg  5 mg Oral QHS Clary, Greg Lawson, MD   5 mg at 03/12/18 2148  . famotidine (PEPCID) tablet 20 mg  20 mg Oral BID Money, Travis B, FNP   20 mg at 03/13/18 0813  . FLUoxetine (PROZAC) capsule 20 mg  20 mg Oral Daily Clary, Greg Lawson, MD   20 mg at 03/13/18 0814  . hydrOXYzine (ATARAX/VISTARIL) tablet 25 mg  25 mg Oral TID PRN Berry, Jason A, NP   25 mg at 03/13/18 1318  . magnesium hydroxide (MILK OF MAGNESIA) suspension 30 mL  30 mL Oral Daily PRN Berry, Jason A, NP      . neomycin-bacitracin-polymyxin (NEOSPORIN) ointment   Topical BID Clary, Greg Lawson, MD      . nicotine (NICODERM CQ - dosed in mg/24 hours) patch 21 mg  21 mg Transdermal Daily Clary, Greg Lawson, MD   21 mg at 03/13/18 0816  . pneumococcal 23 valent vaccine (PNU-IMMUNE) injection 0.5 mL  0.5 mL Intramuscular Tomorrow-1000 Clary, Greg Lawson, MD      . traZODone (DESYREL) tablet 100 mg  100 mg Oral QHS,MR X 1 Money, Travis B, FNP   100 mg at 03/12/18 2148    Lab Results:  No results found for this or any previous visit (from the past 48 hour(s)).  Blood Alcohol level:  Lab Results  Component Value Date   ETH <10 03/09/2018   ETH <5 05/16/2017    Metabolic Disorder Labs: Lab Results  Component Value Date   HGBA1C 5.3 05/17/2017   MPG 105 05/17/2017   Lab Results  Component Value Date   PROLACTIN 17.3 (H) 05/17/2017   Lab Results  Component Value Date   CHOL 202 (H) 05/17/2017   TRIG 165 (H) 05/17/2017   HDL 41 05/17/2017   CHOLHDL 4.9 05/17/2017   VLDL 33 05/17/2017   LDLCALC 128 (H) 05/17/2017    Physical Findings: AIMS: Facial and Oral Movements Muscles of Facial Expression: None, normal Lips  and Perioral Area: None, normal Jaw: None, normal Tongue: None, normal,Extremity Movements Upper (arms, wrists, hands, fingers): None, normal Lower (legs, knees, ankles, toes): None, normal, Trunk Movements Neck, shoulders, hips: None, normal, Overall Severity Severity of abnormal movements (highest score from questions above): None, normal Incapacitation due to abnormal movements: None, normal Patient's awareness of abnormal movements (rate only patient's report): No Awareness, Dental Status Current problems with teeth and/or dentures?: No Does patient usually wear dentures?: No  CIWA:    COWS:     Musculoskeletal: Strength &   Muscle Tone: within normal limits Gait & Station: normal Patient leans: N/A  Psychiatric Specialty Exam: Physical Exam  Nursing note and vitals reviewed. Constitutional: He is oriented to person, place, and time. He appears well-developed and well-nourished.  Respiratory: Effort normal.  Musculoskeletal: Normal range of motion.  Neurological: He is alert and oriented to person, place, and time.  Skin: Skin is warm.    Review of Systems  Constitutional: Negative.   HENT: Negative.   Eyes: Negative.   Respiratory: Negative.   Cardiovascular: Negative.   Gastrointestinal: Positive for heartburn.  Genitourinary: Negative.   Musculoskeletal: Negative.   Skin: Negative.   Neurological: Negative.   Endo/Heme/Allergies: Negative.   Psychiatric/Behavioral: Positive for depression. Negative for hallucinations and suicidal ideas. The patient is nervous/anxious.   Denies chest pain, no shortness of breath, denies vomiting  Blood pressure 101/78, pulse 82, temperature (!) 97.5 F (36.4 C), temperature source Oral, resp. rate 18, height 5' 8" (1.727 m), weight 81.2 kg (179 lb), SpO2 100 %.Body mass index is 27.22 kg/m.  General Appearance: Fairly Groomed  Eye Contact:  Good  Speech:  Normal Rate  Volume:  Normal  Mood:  reports he is feeling better, less  depressed    Affect:  Negative and remains flat, vaguely anxious   Thought Process:  Linear and Descriptions of Associations: Intact  Orientation:  Full (Time, Place, and Person)  Thought Content:  No hallucinations, no delusions expressed, does not appear internally preoccupied  Suicidal Thoughts:  No at this time denies suicidal plan or intention and contracts for safety on unit, denies homicidal ideations  Homicidal Thoughts:  No  Memory:  Immediate;   Good Recent;   Good Remote;   Good  Judgement:  Fair, improving   Insight:  Fair, improving   Psychomotor Activity:  Normal  Concentration:  Concentration: Good and Attention Span: Good  Recall:  Good  Fund of Knowledge:  Good  Language:  Good  Akathisia:  No  Handed:  Right  AIMS (if indicated):     Assets:  Communication Skills Desire for Improvement Social Support  ADL's:  Fair  Cognition:  WNL  Sleep:  Number of Hours: 6.5   Assessment-patient reports improving mood , feeling better, which he attributes to finding out he is not currently facing eviction, which had been a significant stressor. Continues to present fairly groomed, with blunted affect, isolative. Denies suicidal ideations, denies hallucinations. Thus far tolerating Abilify and Prozac well, without side effects  Treatment Plan Summary: Daily contact with patient to assess and evaluate symptoms and progress in treatment, Medication management and Plan is to:  -Treatment plan reviewed as below today April 17 -Continue  Famotidine 20 mg BID for GERD -Continue Trazodone 100 mg QHS PRNfor insomnia -Continue Abilify 5 mg QHS for mood disorder, antidepressant augmentation -Continue Prozac 20 mg QDAY for depression -Continue Vistaril 25 mg TID PRN for anxiety -Encourage group therapy participation to work on coping skills and symptom reduction -Treatment team working on disposition plans.    A , MD 03/13/2018, 1:54 PM  Patient ID: Angel [name],  male   DOB: 10/20/1981, 37 y.o.   MRN: [id number]  

## 2018-03-13 NOTE — Progress Notes (Signed)
Recreation Therapy Notes  Date: 4.17.19 Time: 9:30 a.m. Location: [address] Dayroom   Group Topic: Stress Management   Goal Area(s) Addresses:  Goal 1.1: To reduce stress  -Patient will feel a reduction in stress level  -Patient will understand the importance of stress management  -Patient will participate during stress management group      Intervention: Stress Management  Activity: Guided Imagery: Patients were in a peaceful environment with soft lighting enhancing patients mood. Patients listened to a deep concentration meditation from the calm app.  Education: Stress Management, Discharge Planning.    Education Outcome: Acknowledges edcuation/In group clarification offered/Needs additional education   Clinical Observations/Feedback:: Patient did not attend     Dequann Vandervelden, Recreation Therapy Intern   Tel [place]via 03/13/2018 8:40 AM 

## 2018-03-13 NOTE — [facility] Suicide Risk Assessment (Signed)
[facility] INPATIENT:  Family/Significant Other Suicide Prevention Education  Suicide Prevention Education:  Education Completed; Angel CrandallKenneth [name], brother [phone] has been identified by the patient as the family member/significant other with whom the patient will be residing, and identified as the person(s) who will aid the patient in the event of a mental health crisis (suicidal ideations/suicide attempt).  With written consent from the patient, the family member/significant other has been provided the following suicide prevention education, prior to the and/or following the discharge of the patient.  The suicide prevention education provided includes the following:  Suicide risk factors  Suicide prevention and interventions  National Suicide Hotline telephone number  [facility] telephone number  [facility] 911  [facility] and/or [facility] telephone number  Request made of family/significant other to:  Remove weapons (e.g., guns, rifles, knives), all items previously/currently identified as safety concern.    Remove drugs/medications (over-the-counter, prescriptions, illicit drugs), all items previously/currently identified as a safety concern.  The family member/significant other verbalizes understanding of the suicide prevention education information provided.  The family member/significant other agrees to remove the items of safety concern listed above.  Angel SarahJolan E Marcanthony [name] 03/13/2018, 3:51 PM

## 2018-03-13 NOTE — [facility] Suicide Risk Assessment (Signed)
[facility] INPATIENT:  Family/Significant Other Suicide Prevention Education  Suicide Prevention Education:  Contact Attempts: Birdena CrandallKenneth Wrenn, brother [phone] has been identified by the patient as the family member/significant other with whom the patient will be residing, and identified as the person(s) who will aid the patient in the event of a mental health crisis.  With written consent from the patient, two attempts were made to provide suicide prevention education, prior to and/or following the patient's discharge.  We were unsuccessful in providing suicide prevention education.  A suicide education pamphlet was given to the patient to share with family/significant other.  Date and time of first attempt:   03/13/18 / 3:32pm   Aliyanna Wassmer E Jerelle Virden 03/13/2018, 3:32 PM

## 2018-03-13 NOTE — Progress Notes (Signed)
Pt presents with a flat affect, depressed mood and poor hygiene. Pt reports improved sleep last night. Pt reports ongoing depression and anxiety today but reports that it has decreased compared to yesterday. Pt appears withdrawn and is isolative to his room. Pt have minimal interaction with his peers on the unit. Pt denies SI/HI.  Pt reported tolerating his meds well and denies any side effects to meds. Medications reviewed with pt. Verbal support provided. Pt encouraged to attend groups. 15 minute checks performed for safety. Pt compliant with tx plan.

## 2018-03-13 NOTE — Progress Notes (Signed)
DATA ACTION RESPONSE  Objective- Pt. is visible in the dayroom, seen playing uno cards with peers.Presents with an anxious affect and mood. Avoidant in eye contact. Pt was more engaging with peers and staff. C/o of anxiety this evening. Pt states "I guess I am ready to go home".  Subjective- Denies having any SI/HI/AVH/Pain at this time.Is cooperative and remains safe on the unit.  1:1 interaction in private to establish rapport. Encouragement, education, & support given from staff. PRN vistaril requested and will re-eval accordingly.   Safety maintained with Q 15 checks. Continue with POC.

## 2018-03-13 NOTE — Progress Notes (Signed)
  DATA ACTION RESPONSE  Objective- Pt. is visible in the dayroom, seen putting together a puzzle; no interaction.Presents with a depressed/anxious affect and mood. Avoidant in eye contact. Pt was cautious/guarded on approach.C/o of anxiety this evening. Pleasant in milieu.  Subjective- Denies having any SI/HI/AVH/Pain at this time.Is cooperative and remains safe on the unit.  1:1 interaction in private to establish rapport. Encouragement, education, & support given from staff.  PRN vistaril requested and will re-eval accordingly.   Safety maintained with Q 15 checks. Continue with POC.

## 2018-03-13 NOTE — Progress Notes (Signed)
Child/Adolescent Psychoeducational Group Note  Date:  03/13/2018 Time:  7:20 PM  Group Topic/Focus:  Goals Group:   The focus of this group is to help patients establish daily goals to achieve during treatment and discuss how the patient can incorporate goal setting into their daily lives to aide in recovery.  Participation Level:  Active  Participation Quality:  Appropriate  Affect:  Appropriate  Cognitive:  Alert  Insight:  Appropriate  Engagement in Group:  Engaged  Modes of Intervention:  Discussion  Additional Comments:  Pt attended group and participated in group activity.  Derya Dettmann R Octavia Velador 03/13/2018, 7:20 PM

## 2018-03-13 NOTE — [facility] Group Notes (Signed)
[facility] Mental Health Association Group Therapy      03/13/2018 11:43 AM  Type of Therapy: Mental Health Association Presentation  Participation Level: Active  Participation Quality: Attentive  Affect: Appropriate  Cognitive: Oriented  Insight: Developing/Improving  Engagement in Therapy: Engaged  Modes of Intervention: Discussion, Education and Socialization  Summary of Progress/Problems: Mental Health Association (MHA) Speaker came to talk about his personal journey with mental health. The pt processed ways by which to relate to the speaker. MHA speaker provided handouts and educational information pertaining to groups and services offered by the MHA. Pt was engaged in speaker's presentation and was receptive to resources provided.    Toivo Bordon LCSWA Clinical Social Worker   

## 2018-03-14 NOTE — [facility] Group Notes (Signed)
LCSW Group Therapy Note 03/14/2018 2:58 PM  Type of Therapy/Topic: Group Therapy: Feelings about Diagnosis  Participation Level: Active   Description of Group:  This group will allow patients to explore their thoughts and feelings about diagnoses they have received. Patients will be guided to explore their level of understanding and acceptance of these diagnoses. Facilitator will encourage patients to process their thoughts and feelings about the reactions of others to their diagnosis and will guide patients in identifying ways to discuss their diagnosis with significant others in their lives. This group will be process-oriented, with patients participating in exploration of their own experiences, giving and receiving support, and processing challenge from other group members.  Therapeutic Goals: 1. Patient will demonstrate understanding of diagnosis as evidenced by identifying two or more symptoms of the disorder 2. Patient will be able to express two feelings regarding the diagnosis 3. Patient will demonstrate their ability to communicate their needs through discussion and/or role play  Summary of Patient Progress:  Angel [name] was engaged and participated throughout the group session. He stated that he felt "scard" when he learned about his diagnosis. Angel [name] states that his family and friend became supportive when he shared that he had a mental health diagnosis. Angel [name] states that he plans to learn more coping skills in order to deal with his diagnosis moving forward.    Therapeutic Modalities:  Cognitive Behavioral Therapy Brief Therapy Feelings Identification    Angel [name] Catalina AntiguaWilliams LCSWA Clinical Social Worker

## 2018-03-14 NOTE — [facility] Group Notes (Addendum)
Adult Psychoeducational Group Note  Date:  03/14/2018 Time:  10:51 PM  Group Topic/Focus:  Wrap-Up Group:   The focus of this group is to help patients review their daily goal of treatment and discuss progress on daily workbooks.  Participation Level:  Active  Participation Quality:  Appropriate and Attentive  Affect:  Appropriate  Cognitive:  Alert and Appropriate  Insight: Appropriate and Good  Engagement in Group:  Engaged  Modes of Intervention:  Discussion and Education  Additional Comments:  Pt attended and participated in wrap up group this evening. Pt had a good day and pt goal was to be discharged.   Chrisandra NettersOctavia A Eavan Gonterman 03/14/2018, 10:51 PM

## 2018-03-14 NOTE — [facility] Group Notes (Signed)
[facility] Group Notes:  (Nursing/MHT/Case Management/Adjunct)    Angel [name]  Angel [name] 03/14/2018, 5:45 PM [facility] Group Notes:  (Nursing/MHT/Case Management/Adjunct)  Date:  03/14/2018  Time:  5:41 PM  Type of Therapy:  Identifying mental health crises worksheet, identifying triggers, and working on coping mechanism.   Participation Level:  Active  Participation Quality:  Attentive  Affect:  Appropriate  Cognitive:  Alert  Insight:  Appropriate  Engagement in Group:  Engaged  Modes of Intervention:  Education  Summary of Progress/Problems: Patient was able to actively participate in group discussion.  [name] 03/14/2018, 5:41 PM

## 2018-03-14 NOTE — Progress Notes (Signed)
Pt presents with a flat affect and depressed mood. Pt appears disheleved with poor hygiene. Pt reports deceased depression and anxiety today. Pt denies Si/Hi. Pt reports fair sleep last night. Pt denies AVH. Pt noted to be less isolative today and observed engaging with peers in the dayroom. Pt reports tolerating his meds well. Pt denies any side effects to meds. Verbal support provided. Pt encouraged to attend groups. 15 minute checks performed for safety. Pt compliant with tx.

## 2018-03-14 NOTE — Progress Notes (Signed)
[facility] MD Progress Note  03/14/2018 3:12 PM CONTRELL BALLENTINE  MRN:  [id number]   Subjective: patient reports he is feeling "OK". States he is feeling better than on admission. Denies medication side effects.   Objective: I have discussed case with treatment team and have met with patient.   He is a 37 year old male, with a history of depression, anxiety.  Presented due to worsening depression, suicidal ideations, self-inflicted cuts.  Patient presents with improving mood and a more reactive range of affect. Grooming is improving and eye contact is better. He has been out on unit, visible in day room, polite and cooperative on approach. Limited interactions with peers. Denies medication side effects. Currently minimizes neuro-vegetative symptoms of depression and describes mood as improved, denies suicidal ideations or ongoing thoughts of self harm or self cutting. States he regrets cutting because cuts"  do not look good", and expresses desire to get a tattoo to cover them. States he is " never cutting myself again".  Principal Problem: Severe recurrent major depression without psychotic features ([place]) Diagnosis:   Patient Active Problem List   Diagnosis Date Noted  . GERD (gastroesophageal reflux disease) [K21.9] 03/11/2018  . Severe recurrent major depression without psychotic features ([place]) [F33.2] 03/09/2018  . Major depressive disorder, recurrent episode ([place]) [F33.9] 05/16/2017  . Tobacco use disorder [F17.200] 07/17/2015  . Headache syndrome [G44.89] 07/17/2015  . Nicotine dependence [F17.200] 07/17/2015  . Generalized anxiety disorder [F41.1] 11/01/2013  . Major depressive disorder, recurrent episode, severe, without mention of psychotic behavior [F33.2] 10/31/2013   Total Time spent with patient: 20 minutes  Past Psychiatric History: See H&P  Past Medical History:  Past Medical History:  Diagnosis Date  . Anxiety   . Depression   . Migraine    History reviewed. No pertinent  surgical history. Family History:  Family History  Problem Relation Age of Onset  . Cancer Mother        brain  . Diabetes Brother   . Hypertension Brother    Family Psychiatric  History: See H&P Social History:  Social History   Substance and Sexual Activity  Alcohol Use No     Social History   Substance and Sexual Activity  Drug Use No    Social History   Socioeconomic History  . Marital status: Single    Spouse name: Not on file  . Number of children: Not on file  . Years of education: Not on file  . Highest education level: Not on file  Occupational History  . Not on file  Social Needs  . Financial resource strain: Not on file  . Food insecurity:    Worry: Not on file    Inability: Not on file  . Transportation needs:    Medical: Not on file    Non-medical: Not on file  Tobacco Use  . Smoking status: Current Every Day Smoker    Packs/day: 1.50    Years: 20.00    Pack years: 30.00    Types: Cigarettes  . Smokeless tobacco: Never Used  Substance and Sexual Activity  . Alcohol use: No  . Drug use: No  . Sexual activity: Not on file  Lifestyle  . Physical activity:    Days per week: Not on file    Minutes per session: Not on file  . Stress: Not on file  Relationships  . Social connections:    Talks on phone: Not on file    Gets together: Not on file  Attends religious service: Not on file    Active member of club or organization: Not on file    Attends meetings of clubs or organizations: Not on file    Relationship status: Not on file  Other Topics Concern  . Not on file  Social History Narrative  . Not on file   Additional Social History:   Sleep: Improving  Appetite:  Improving  Current Medications: Current Facility-Administered Medications  Medication Dose Route Frequency Provider Last Rate Last Dose  . acetaminophen (TYLENOL) tablet 650 mg  650 mg Oral Q6H PRN Lindon Romp A, NP   650 mg at 03/14/18 1210  . alum & mag  hydroxide-simeth (MAALOX/MYLANTA) 200-200-20 MG/5ML suspension 30 mL  30 mL Oral Q4H PRN Lindon Romp A, NP      . ARIPiprazole (ABILIFY) tablet 5 mg  5 mg Oral QHS Sharma Covert, MD   5 mg at 03/13/18 2200  . famotidine (PEPCID) tablet 20 mg  20 mg Oral BID Money, Lowry Ram, FNP   20 mg at 03/14/18 0745  . FLUoxetine (PROZAC) capsule 20 mg  20 mg Oral Daily Sharma Covert, MD   20 mg at 03/14/18 0745  . hydrOXYzine (ATARAX/VISTARIL) tablet 25 mg  25 mg Oral TID PRN Lindon Romp A, NP   25 mg at 03/13/18 2200  . magnesium hydroxide (MILK OF MAGNESIA) suspension 30 mL  30 mL Oral Daily PRN Lindon Romp A, NP      . neomycin-bacitracin-polymyxin (NEOSPORIN) ointment   Topical BID Sharma Covert, MD      . nicotine (NICODERM CQ - dosed in mg/24 hours) patch 21 mg  21 mg Transdermal Daily Sharma Covert, MD   21 mg at 03/14/18 0745  . pneumococcal 23 valent vaccine (PNU-IMMUNE) injection 0.5 mL  0.5 mL Intramuscular Tomorrow-1000 Sharma Covert, MD      . traZODone (DESYREL) tablet 100 mg  100 mg Oral QHS PRN Cobos, Myer Peer, MD   100 mg at 03/13/18 2200    Lab Results:  No results found for this or any previous visit (from the past 48 hour(s)).  Blood Alcohol level:  Lab Results  Component Value Date   ETH <10 03/09/2018   ETH <5 09/32/6712    Metabolic Disorder Labs: Lab Results  Component Value Date   HGBA1C 5.3 05/17/2017   MPG 105 05/17/2017   Lab Results  Component Value Date   PROLACTIN 17.3 (H) 05/17/2017   Lab Results  Component Value Date   CHOL 202 (H) 05/17/2017   TRIG 165 (H) 05/17/2017   HDL 41 05/17/2017   CHOLHDL 4.9 05/17/2017   VLDL 33 05/17/2017   LDLCALC 128 (H) 05/17/2017    Physical Findings: AIMS: Facial and Oral Movements Muscles of Facial Expression: None, normal Lips and Perioral Area: None, normal Jaw: None, normal Tongue: None, normal,Extremity Movements Upper (arms, wrists, hands, fingers): None, normal Lower (legs,  knees, ankles, toes): None, normal, Trunk Movements Neck, shoulders, hips: None, normal, Overall Severity Severity of abnormal movements (highest score from questions above): None, normal Incapacitation due to abnormal movements: None, normal Patient's awareness of abnormal movements (rate only patient's report): No Awareness, Dental Status Current problems with teeth and/or dentures?: No Does patient usually wear dentures?: No  CIWA:    COWS:     Musculoskeletal: Strength & Muscle Tone: within normal limits Gait & Station: normal Patient leans: N/A  Psychiatric Specialty Exam: Physical Exam  Nursing note and vitals reviewed. Constitutional: He  is oriented to person, place, and time. He appears well-developed and well-nourished.  Respiratory: Effort normal.  Musculoskeletal: Normal range of motion.  Neurological: He is alert and oriented to person, place, and time.  Skin: Skin is warm.    Review of Systems  Constitutional: Negative.   HENT: Negative.   Eyes: Negative.   Respiratory: Negative.   Cardiovascular: Negative.   Gastrointestinal: Positive for heartburn.  Genitourinary: Negative.   Musculoskeletal: Negative.   Skin: Negative.   Neurological: Negative.   Endo/Heme/Allergies: Negative.   Psychiatric/Behavioral: Positive for depression. Negative for hallucinations and suicidal ideas. The patient is nervous/anxious.   Denies chest pain, no shortness of breath, denies vomiting  Blood pressure 104/79, pulse (!) 112, temperature 97.7 F (36.5 C), temperature source Oral, resp. rate 15, height '5\' 8"'  (1.727 m), weight 81.2 kg (179 lb), SpO2 100 %.Body mass index is 27.22 kg/m.  General Appearance: improving grooming compared to admission  Eye Contact:  improving eye contact   Speech:  Normal Rate  Volume:  Normal  Mood:  reports improvement and currently minimizes depression   Affect:  becoming more reactive, smiles at times appropriately  Thought Process:  Linear and  Descriptions of Associations: Intact- concrete   Orientation:  Full (Time, Place, and Person)  Thought Content:  No hallucinations, no delusions expressed, does not appear internally preoccupied  Suicidal Thoughts:  No at this time denies suicidal plan or intention and contracts for safety on unit, denies homicidal ideations  Homicidal Thoughts:  No  Memory:  recent and remote grossly intact   Judgement: improving   Insight:  Fair, improving   Psychomotor Activity:  Normal- no psychomotor agitation or restlessness   Concentration:  Concentration: Good and Attention Span: Good  Recall:  Good  Fund of Knowledge:  Good  Language:  Good  Akathisia:  No  Handed:  Right  AIMS (if indicated):     Assets:  Communication Skills Desire for Improvement Social Support  ADL's:  Fair  Cognition:  WNL  Sleep:  Number of Hours: 6.25   Assessment-patient reports he is feeling better than prior to admission, and presents with improving range of affect, improving grooming, and improving eye contact . Socialization/interaction with peers is limited, but is visible in day room and polite on approach. Denies medication side effects. Denies suicidal ideations Tolerating medications well .   Treatment Plan Summary: Daily contact with patient to assess and evaluate symptoms and progress in treatment, Medication management and Plan is to:  -Treatment plan reviewed as below today April 18 -Continue  Famotidine 20 mg BID for GERD -Continue Trazodone 100 mg QHS PRNfor insomnia -Continue Abilify 5 mg QHS for mood disorder, antidepressant augmentation -Continue Prozac 20 mg QDAY for depression -Continue Vistaril 25 mg TID PRN for anxiety -Encourage group therapy participation to work on Radiographer, therapeutic and symptom reduction -Treatment team working on Financial trader.   Jenne Campus, MD 03/14/2018, 3:12 PM  Patient ID: Kathlynn Grate, male   DOB: [birth year], 37 y.o.   MRN: [id number]

## 2018-03-15 MED ORDER — ARIPIPRAZOLE 5 MG PO TABS: 5.0000 mg | ORAL_TABLET | Freq: Every day | ORAL | 0 refills | Status: DC

## 2018-03-15 MED ORDER — TRAZODONE HCL 100 MG PO TABS: 100.0000 mg | ORAL_TABLET | Freq: Every evening | ORAL | 0 refills | Status: DC | PRN

## 2018-03-15 MED ORDER — BACITRACIN-NEOMYCIN-POLYMYXIN OINTMENT TUBE: 1.0000 "application " | TOPICAL_OINTMENT | Freq: Two times a day (BID) | CUTANEOUS | Status: DC

## 2018-03-15 MED ORDER — NICOTINE 21 MG/24HR TD PT24: 21.0000 mg | MEDICATED_PATCH | Freq: Every day | TRANSDERMAL | 0 refills | Status: DC

## 2018-03-15 MED ORDER — FAMOTIDINE 20 MG PO TABS: 20.0000 mg | ORAL_TABLET | Freq: Two times a day (BID) | ORAL | 0 refills | Status: DC

## 2018-03-15 MED ORDER — FLUOXETINE HCL 20 MG PO CAPS
20.0000 mg | ORAL_CAPSULE | Freq: Every day | ORAL | 0 refills | Status: DC
Start: 2018-03-16 — End: 2018-08-27

## 2018-03-15 MED ORDER — HYDROXYZINE HCL 25 MG PO TABS: 25.0000 mg | ORAL_TABLET | Freq: Three times a day (TID) | ORAL | 0 refills | Status: DC | PRN

## 2018-03-15 NOTE — Progress Notes (Signed)
Patient ID: Angel [name], male   DOB: December 24, 1980, 37 y.o.   MRN: [id number] DAR Note:  Pt observed playing cards with peers in the dayroom. Pt complained of mild depression; "ever since you said I should be coming out of my room, I have been feeling better." Pt denied SI/HI, pain, anxiety and AVH. Pt was med compliant. All Pt's questions and concerns addressed. Support, encouragement, and safe environment provided. Pt attended wrap-up group.

## 2018-03-15 NOTE — [facility] Suicide Risk Assessment (Addendum)
[facility] Discharge Suicide Risk Assessment   Principal Problem: Severe recurrent major depression without psychotic features [facility]) Discharge Diagnoses:  Patient Active Problem List   Diagnosis Date Noted  . GERD (gastroesophageal reflux disease) [K21.9] 03/11/2018  . Severe recurrent major depression without psychotic features (HCC) [F33.2] 03/09/2018  . Major depressive disorder, recurrent episode (HCC) [F33.9] 05/16/2017  . Tobacco use disorder [F17.200] 07/17/2015  . Headache syndrome [G44.89] 07/17/2015  . Nicotine dependence [F17.200] 07/17/2015  . Generalized anxiety disorder [F41.1] 11/01/2013  . Major depressive disorder, recurrent episode, severe, without mention of psychotic behavior [F33.2] 10/31/2013    Total Time spent with patient: 30 minutes  Musculoskeletal: Strength & Muscle Tone: within normal limits Gait & Station: normal Patient leans: N/A  Psychiatric Specialty Exam: ROSno headache, no chest pain, no shortness of breath, no vomiting , no fever   Blood pressure 107/80, pulse (!) 108, temperature 97.8 F (36.6 C), temperature source Oral, resp. rate 12, height 5\' 8"  (1.727 m), weight 81.2 kg (179 lb), SpO2 100 %.Body mass index is 27.22 kg/m.  General Appearance: fair-improving   Eye Contact::  improving   Speech:  Normal Rate409  Volume:  Normal  Mood:  states he feels " a lot better", denies feeling depressed, and describes mood as 10/10  Affect:  Appropriate and full in range   Thought Process:  Linear and Descriptions of Associations: Intact  Orientation:  Full (Time, Place, and Person)  Thought Content:  no hallucinations, no delusions, not internally preoccupied   Suicidal Thoughts:  No denies suicidal or self injurious ideations, no homicidal or violent ideations  Homicidal Thoughts:  No  Memory:  recent and remote grossly intact   Judgement:  Other:  improving   Insight:  improving   Psychomotor Activity:  Normal  Concentration:  Good  Recall:  Good   Fund of Knowledge:Good  Language: Good  Akathisia:  Negative  Handed:  Right  AIMS (if indicated):   no abnormal or involuntary movements noted or reported  Assets:  Communication Skills Desire for Improvement Resilience  Sleep:  Number of Hours: 6.25  Cognition: WNL  ADL's:  Intact   Mental Status Per Nursing Assessment::   On Admission:     Demographic Factors:  37 year old single male, no children, lives with friend/roommate  Loss Factors: Financial difficulties, had been concerned about facing eviction, medication non compliance  Historical Factors: History of depression, history of prior psychiatric admissions History of self cutting   Risk Reduction Factors:   Living with another person, especially a relative and Positive coping skills or problem solving skills  Continued Clinical Symptoms:  At this time patient is alert, attentive, well related, pleasant, cooperative, describes mood as improved and denies feeling depressed at this time, affect is appropriate, reactive, no thought disorder, no suicidal or self injurious ideations, no homicidal or violent ideations, no psychotic symptoms.  Denies medication side effects. Side effects reviewed, including risk of sedation, akathisia-movement disorders, metabolic issues on Abilify Behavior on unit calm and in good control   Cognitive Features That Contribute To Risk:  No gross cognitive deficits noted upon discharge. Is alert , attentive, and oriented x 3   Suicide Risk:  Mild:  Suicidal ideation of limited frequency, intensity, duration, and specificity.  There are no identifiable plans, no associated intent, mild dysphoria and related symptoms, good self-control (both objective and subjective assessment), few other risk factors, and identifiable protective factors, including available and accessible social support.  Follow-up Information  [facility] Follow up on 03/18/2018.   Why:  Hospital follow up  appointment is 03/18/18 at 8:00am. Please be sure to bring your Photo ID, SSN (card), Insurance card (if you have any) and any discharge paperwork from the hospital.  Contact information: [address] [place] [id number] [phone]           Plan Of Care/Follow-up recommendations:  Activity:  as tolerated Diet:  regular Tests:  NA Other:  See below  Patient is expressing readiness for discharge and is leaving unit in good spirits States brother will pick him up later today Plans to follow up as above   Craige CottaFernando A Cobos, MD 03/15/2018, 8:00 AM

## 2018-03-15 NOTE — Progress Notes (Signed)
  [facility]  Will you be returning to the same living situation after discharge:  Yes,  patient plans to return to his home with his roomates At discharge, do you have transportation home?: Yes,  [facility].  Do you have the ability to pay for your medications: No.  Release of information consent forms completed and in the chart;  Patient's signature needed at discharge.  Patient to Follow up at: Follow-up Information    [facility] Follow up on 03/18/2018.   Why:  Hospital follow up appointment is 03/18/18 at 8:00am. Please be sure to bring your Photo ID, SSN (card), Insurance card (if you have any) and any discharge paperwork from the hospital.  Contact information: [address] [place] [id number] [phone]           Next level of care provider [facility]:yes  Safety Planning and Suicide Prevention discussed: Yes,  with the patient's brother  Have you used any form of tobacco in the last 30 days? (Cigarettes, Smokeless Tobacco, Cigars, and/or Pipes): Yes  Has patient been referred to the Quitline?: Patient refused referral  Patient has been referred for addiction treatment: N/A  Maeola SarahJolan E Eryca Bolte, LCSWA 03/15/2018, 11:32 AM

## 2018-03-15 NOTE — Tx Team (Signed)
Interdisciplinary Treatment and Diagnostic Plan Update  03/15/2018 Time of Session: 8:42 AM  Angel WITHEM MRN: [id number]  Principal Diagnosis: Severe recurrent major depression without psychotic features [facility])  Secondary Diagnoses: Principal Problem:   Severe recurrent major depression without psychotic features ([place]) Active Problems:   GERD (gastroesophageal reflux disease)   Current Medications:  Current Facility-Administered Medications  Medication Dose Route Frequency Provider Last Rate Last Dose  . acetaminophen (TYLENOL) tablet 650 mg  650 mg Oral Q6H PRN Lindon Romp A, NP   650 mg at 03/14/18 1210  . alum & mag hydroxide-simeth (MAALOX/MYLANTA) 200-200-20 MG/5ML suspension 30 mL  30 mL Oral Q4H PRN Lindon Romp A, NP      . ARIPiprazole (ABILIFY) tablet 5 mg  5 mg Oral QHS Sharma Covert, MD   5 mg at 03/14/18 2200  . famotidine (PEPCID) tablet 20 mg  20 mg Oral BID Money, Lowry Ram, FNP   20 mg at 03/15/18 0834  . FLUoxetine (PROZAC) capsule 20 mg  20 mg Oral Daily Sharma Covert, MD   20 mg at 03/15/18 0834  . hydrOXYzine (ATARAX/VISTARIL) tablet 25 mg  25 mg Oral TID PRN Rozetta Nunnery, NP   25 mg at 03/14/18 2200  . magnesium hydroxide (MILK OF MAGNESIA) suspension 30 mL  30 mL Oral Daily PRN Lindon Romp A, NP      . neomycin-bacitracin-polymyxin (NEOSPORIN) ointment   Topical BID Sharma Covert, MD      . nicotine (NICODERM CQ - dosed in mg/24 hours) patch 21 mg  21 mg Transdermal Daily Sharma Covert, MD   21 mg at 03/15/18 7673  . pneumococcal 23 valent vaccine (PNU-IMMUNE) injection 0.5 mL  0.5 mL Intramuscular Tomorrow-1000 Sharma Covert, MD      . traZODone (DESYREL) tablet 100 mg  100 mg Oral QHS PRN Cobos, Myer Peer, MD   100 mg at 03/14/18 2200    PTA Medications: No medications prior to admission.    Patient Stressors: Financial difficulties Medication change or noncompliance  Patient Strengths: Ability for insight Average or above  average intelligence Capable of independent living General fund of knowledge  Treatment Modalities: Medication Management, Group therapy, Case management,  1 to 1 session with clinician, Psychoeducation, Recreational therapy.   Physician Treatment Plan for Primary Diagnosis: Severe recurrent major depression without psychotic features ([name]) Long Term Goal(s): Improvement in symptoms so as ready for discharge  Short Term Goals: Ability to identify changes in lifestyle to reduce recurrence of condition will improve Ability to verbalize feelings will improve Ability to disclose and discuss suicidal ideas Ability to demonstrate self-control will improve Ability to identify and develop effective coping behaviors will improve Ability to maintain clinical measurements within normal limits will improve Compliance with prescribed medications will improve Ability to identify triggers associated with substance abuse/mental health issues will improve Ability to identify changes in lifestyle to reduce recurrence of condition will improve Ability to verbalize feelings will improve Ability to disclose and discuss suicidal ideas Ability to demonstrate self-control will improve Ability to identify and develop effective coping behaviors will improve Ability to maintain clinical measurements within normal limits will improve Compliance with prescribed medications will improve Ability to identify triggers associated with substance abuse/mental health issues will improve  Medication Management: Evaluate patient's response, side effects, and tolerance of medication regimen.  Therapeutic Interventions: 1 to 1 sessions, Unit Group sessions and Medication administration.  Evaluation of Outcomes: Adequate for Discharge  Physician Treatment Plan for  Secondary Diagnosis: Principal Problem:   Severe recurrent major depression without psychotic features ([name]) Active Problems:   GERD (gastroesophageal reflux  disease)   Long Term Goal(s): Improvement in symptoms so as ready for discharge  Short Term Goals: Ability to identify changes in lifestyle to reduce recurrence of condition will improve Ability to verbalize feelings will improve Ability to disclose and discuss suicidal ideas Ability to demonstrate self-control will improve Ability to identify and develop effective coping behaviors will improve Ability to maintain clinical measurements within normal limits will improve Compliance with prescribed medications will improve Ability to identify triggers associated with substance abuse/mental health issues will improve Ability to identify changes in lifestyle to reduce recurrence of condition will improve Ability to verbalize feelings will improve Ability to disclose and discuss suicidal ideas Ability to demonstrate self-control will improve Ability to identify and develop effective coping behaviors will improve Ability to maintain clinical measurements within normal limits will improve Compliance with prescribed medications will improve Ability to identify triggers associated with substance abuse/mental health issues will improve  Medication Management: Evaluate patient's response, side effects, and tolerance of medication regimen.  Therapeutic Interventions: 1 to 1 sessions, Unit Group sessions and Medication administration.  Evaluation of Outcomes: Adequate for Discharge   RN Treatment Plan for Primary Diagnosis: Severe recurrent major depression without psychotic features ([name]) Long Term Goal(s): Knowledge of disease and therapeutic regimen to maintain health will improve  Short Term Goals: Ability to identify and develop effective coping behaviors will improve and Compliance with prescribed medications will improve  Medication Management: RN will administer medications as ordered by provider, will assess and evaluate patient's response and provide education to patient for prescribed  medication. RN will report any adverse and/or side effects to prescribing provider.  Therapeutic Interventions: 1 on 1 counseling sessions, Psychoeducation, Medication administration, Evaluate responses to treatment, Monitor vital signs and CBGs as ordered, Perform/monitor CIWA, COWS, AIMS and Fall Risk screenings as ordered, Perform wound care treatments as ordered.  Evaluation of Outcomes: Adequate for Discharge   LCSW Treatment Plan for Primary Diagnosis: Severe recurrent major depression without psychotic features ([place]) Long Term Goal(s): Safe transition to appropriate next level of care at discharge, Engage patient in therapeutic group addressing interpersonal concerns.  Short Term Goals: Engage patient in aftercare planning with referrals and resources  Therapeutic Interventions: Assess for all discharge needs, 1 to 1 time with Social worker, Explore available resources and support systems, Assess for adequacy in community support network, Educate family and significant other(s) on suicide prevention, Complete Psychosocial Assessment, Interpersonal group therapy.  Evaluation of Outcomes: Met  Return home, follow up outpt   Progress in Treatment: Attending groups: Yes Participating in groups: Yes Taking medication as prescribed: Yes Toleration medication: Yes, no side effects reported at this time Family/Significant other contact made: Yes, with the patient's brother Patient understands diagnosis: Yes AEB asking for help with self mutilation Discussing patient identified problems/goals with staff: Yes Medical problems stabilized or resolved: Yes Denies suicidal/homicidal ideation: Yes Issues/concerns per patient self-inventory: None Other: N/A  New problem(s) identified: None identified at this time.   New Short Term/Long Term Goal(s): "I got a lot on my mind.  I want to try to get better and not cut myself no more."   Discharge Plan or Barriers: Patient plans to return to his  home in [place], [place] and follow up with [facility] for outpatient medication management and therapy services.   Reason for Continuation of Hospitalization: None  Estimated Length of  Stay: Discharging, 4/19  Attendees: Patient: Angel [name] 03/15/2018  8:42 AM  Physician: Myles Lipps, MD; Dr. Neita Garnet, MD 03/15/2018  8:42 AM  Nursing: Darrol Angel, RN; [place], RN 03/15/2018  8:42 AM  RN Care Manager: Lars Pinks, RN 03/15/2018  8:42 AM  Social Worker: Ripley Fraise; Theresa Duty 03/15/2018  8:42 AM  Recreational Therapist: Winfield Cunas 03/15/2018  8:42 AM  Other: Norberto Sorenson 03/15/2018  8:42 AM  Other:  03/15/2018  8:42 AM    Scribe for Treatment Team:  Roque Lias LCSW 03/15/2018 8:42 AM

## 2018-03-15 NOTE — Discharge Summary (Addendum)
Physician Discharge Summary Note  Patient:  Rosalie DoctorMichael R Gobert is an 37 y.o., male MRN:  [id number] DOB:  1981/01/12 Patient phone:  [phone] (home)  Patient address:   [address] [place] [id number],  Total Time spent with patient: Greater than 30 minutes  Date of Admission:  03/09/2018  Date of Discharge: 03/15/2018  Reason for Admission: Worsening depression & suicidal ideations.  Principal Problem: Severe recurrent major depression without psychotic features [facility](HCC)  Discharge Diagnoses: Patient Active Problem List   Diagnosis Date Noted  . Major depressive disorder, recurrent episode, severe, without mention of psychotic behavior [F33.2] 10/31/2013    Priority: High  . Generalized anxiety disorder [F41.1] 11/01/2013    Priority: Medium  . GERD (gastroesophageal reflux disease) [K21.9] 03/11/2018  . Severe recurrent major depression without psychotic features (HCC) [F33.2] 03/09/2018  . Major depressive disorder, recurrent episode (HCC) [F33.9] 05/16/2017  . Tobacco use disorder [F17.200] 07/17/2015  . Headache syndrome [G44.89] 07/17/2015  . Nicotine dependence [F17.200] 07/17/2015   Past Psychiatric History: Major depressive disorder  Past Medical History:  Past Medical History:  Diagnosis Date  . Anxiety   . Depression   . Migraine    History reviewed. No pertinent surgical history.  Family History:  Family History  Problem Relation Age of Onset  . Cancer Mother        brain  . Diabetes Brother   . Hypertension Brother    Family Psychiatric  History: Major depression: Paternal/maternal aunts. Denies any familial hx of suicide. Social History:  Social History   Substance and Sexual Activity  Alcohol Use No     Social History   Substance and Sexual Activity  Drug Use No    Social History   Socioeconomic History  . Marital status: Single    Spouse name: Not on file  . Number of children: Not on file  . Years of education: Not on file  . Highest  education level: Not on file  Occupational History  . Not on file  Social Needs  . Financial resource strain: Not on file  . Food insecurity:    Worry: Not on file    Inability: Not on file  . Transportation needs:    Medical: Not on file    Non-medical: Not on file  Tobacco Use  . Smoking status: Current Every Day Smoker    Packs/day: 1.50    Years: 20.00    Pack years: 30.00    Types: Cigarettes  . Smokeless tobacco: Never Used  Substance and Sexual Activity  . Alcohol use: No  . Drug use: No  . Sexual activity: Not on file  Lifestyle  . Physical activity:    Days per week: Not on file    Minutes per session: Not on file  . Stress: Not on file  Relationships  . Social connections:    Talks on phone: Not on file    Gets together: Not on file    Attends religious service: Not on file    Active member of club or organization: Not on file    Attends meetings of clubs or organizations: Not on file    Relationship status: Not on file  Other Topics Concern  . Not on file  Social History Narrative  . Not on file   Hospital Course: (Per Md's admission assessment):Patient is a 37 year old male with a past psychiatric history significant for depression and anxiety who presented to a local emergency department with suicidal ideation.  The patient  has a reported history of depression as well as anxiety and stated he had stopped taking his medications proximally 3 months ago.  He stated he did not take his medications because he was unable to have the co-pay to see his psychiatrist or therapist at his local mental health center.  He was going to the clinic on a sliding scale.  He was notified this week that he was going to be evicted, and that depressed him even more so.  He stated his roommate was supposed to be paying that rent.  He became upset, and did superficial cuts on both wrists in an attempt to end his life.  He has multiple psychiatric hospitalizations in the past.   This is  one of several discharge summaries from this [facility] hospital alone for Jaedon. He was recently discharged from this hospital after mood stabilization treatments. He was discharged on medications & recommended to follow-up care on an outpatient basis. He came back to the hospital this time around complaining of worsening depression & suicidal thoughts. Balraj had apparently stopped taking his mental health medications 3 months prior to this current admission. He was in need of mood stabilization treatments.  During the course of this hospitalization, Diane was medicated & discharge on; Abilify 5 mg for mood control, Fluoxetine 20 mg for depression, Hydroxyzine 25 mg prn for anxiety, Nicotine patch 21 mg for smoking cessation & Trazodone 100 mg for insomnia. He was enrolled & participated in the group counseling sessions being offered & held on this unit. He learned coping skills. He received other medication regimen for the other medical symptoms he presented. He tolerated his treatment regimen without any adverse effects or reactions reported.   During the course of this hospitalization, Colbe's improvement was monitored by observation & his daily report of symptom reduction noted. This is evidenced by his presentation of good affect and overall improvement in mood & behavior. . Shaylon's case was presented during treatment team meeting this morning. The team members were all in agreement that Emonte is both mentally & medically stable to be discharged to continue mental health care on an outpatient basis as noted below. He was provided with all the necessary information needed to make this appointment without problems.  Upon discharge, Kashten denied any SI/HI, AVH, delusional thoughts, or paranoia. He was provided with a 7 day sample of his [facility] discharge medications & prescription to be taken to his local pharmacy to be filled. He left [facility] with all personal belongings in no apparent distress.  Transportation per [employer].  Physical Findings: AIMS: Facial and Oral Movements Muscles of Facial Expression: None, normal Lips and Perioral Area: None, normal Jaw: None, normal Tongue: None, normal,Extremity Movements Upper (arms, wrists, hands, fingers): None, normal Lower (legs, knees, ankles, toes): None, normal, Trunk Movements Neck, shoulders, hips: None, normal, Overall Severity Severity of abnormal movements (highest score from questions above): None, normal Incapacitation due to abnormal movements: None, normal Patient's awareness of abnormal movements (rate only patient's report): No Awareness, Dental Status Current problems with teeth and/or dentures?: No Does patient usually wear dentures?: No  CIWA:    COWS:     Musculoskeletal: Strength & Muscle Tone: within normal limits Gait & Station: normal Patient leans: N/A  Psychiatric Specialty Exam: Physical Exam  Nursing note and vitals reviewed. Constitutional: He is oriented to person, place, and time. He appears well-developed.  HENT:  Head: Normocephalic.  Eyes: Pupils are equal, round, and reactive to light.  Neck: Normal  range of motion.  Cardiovascular: Normal rate.  Respiratory: Effort normal.  GI: Soft.  Genitourinary:  Genitourinary Comments: Deferred  Musculoskeletal: Normal range of motion.  Neurological: He is alert and oriented to person, place, and time.  Skin: Skin is warm.    Review of Systems  Constitutional: Negative.   HENT: Negative.   Eyes: Negative.   Respiratory: Negative.   Cardiovascular: Negative.   Gastrointestinal: Negative.   Genitourinary: Negative.   Musculoskeletal: Negative.   Skin: Negative.   Neurological: Negative.   Endo/Heme/Allergies: Negative.   Psychiatric/Behavioral: Positive for depression (Stable  ) and substance abuse (Hx: THC use disorder). Negative for hallucinations (Hx. Psychosis (Stable)), memory loss and suicidal ideas. The patient  has insomnia (Stable  ). The patient is not nervous/anxious.   All other systems reviewed and are negative.   Blood pressure 107/80, pulse (!) 108, temperature 97.8 F (36.6 C), temperature source Oral, resp. rate 12, height 5\' 8"  (1.727 m), weight 81.2 kg (179 lb), SpO2 100 %.Body mass index is 27.22 kg/m.  See Md's SRA.  Have you used any form of tobacco in the last 30 days? (Cigarettes, Smokeless Tobacco, Cigars, and/or Pipes): Yes  Has this patient used any form of tobacco in the last 30 days? (Cigarettes, Smokeless Tobacco, Cigars, and/or Pipes) Yes. Prescription for Nicoderm patch for smoking cessation provided at discharge.   Blood Alcohol level:  Lab Results  Component Value Date   ETH <10 03/09/2018   ETH <5 05/16/2017   Metabolic Disorder Labs:  Lab Results  Component Value Date   HGBA1C 5.3 05/17/2017   MPG 105 05/17/2017   Lab Results  Component Value Date   PROLACTIN 17.3 (H) 05/17/2017   Lab Results  Component Value Date   CHOL 202 (H) 05/17/2017   TRIG 165 (H) 05/17/2017   HDL 41 05/17/2017   CHOLHDL 4.9 05/17/2017   VLDL 33 05/17/2017   LDLCALC 128 (H) 05/17/2017    See Psychiatric Specialty Exam and Suicide Risk Assessment completed by Attending Physician prior to discharge.  Discharge destination:  Home  Is patient on multiple antipsychotic therapies at discharge:  No   Has Patient had three or more failed trials of antipsychotic monotherapy by history:  No  Recommended Plan for Multiple Antipsychotic Therapies: NA  Allergies as of 03/15/2018   No Known Allergies     Medication List    TAKE these medications     Indication  ARIPiprazole 5 MG tablet Commonly known as:  ABILIFY Take 1 tablet (5 mg total) by mouth at bedtime. For mood control  Indication:  Mood control   famotidine 20 MG tablet Commonly known as:  PEPCID Take 1 tablet (20 mg total) by mouth 2 (two) times daily. For acid reflux  Indication:  Gastroesophageal Reflux Disease    FLUoxetine 20 MG capsule Commonly known as:  PROZAC Take 1 capsule (20 mg total) by mouth daily. For depression Start taking on:  03/16/2018  Indication:  Depression   hydrOXYzine 25 MG tablet Commonly known as:  ATARAX/VISTARIL Take 1 tablet (25 mg total) by mouth 3 (three) times daily as needed for anxiety.  Indication:  Feeling Anxious   neomycin-bacitracin-polymyxin Oint Commonly known as:  NEOSPORIN Apply 1 application topically 2 (two) times daily. For wound care  Indication:  Wound care   nicotine 21 mg/24hr patch Commonly known as:  NICODERM CQ - dosed in mg/24 hours Place 1 patch (21 mg total) onto the skin daily. (May buy from over the counter):  For smoking cessation Start taking on:  03/16/2018  Indication:  Nicotine Addiction   traZODone 100 MG tablet Commonly known as:  DESYREL Take 1 tablet (100 mg total) by mouth at bedtime as needed for sleep.  Indication:  Trouble Sleeping      Follow-up Information    [facility], [facility] Follow up on 03/18/2018.   Why:  Hospital follow up appointment is 03/18/18 at 8:00am. Please be sure to bring your Photo ID, SSN (card), Insurance card (if you have any) and any discharge paperwork from the hospital.  Contact information: [address] [place] [address] [phone]          Follow-up recommendations: Activity:  As tolerated Diet: As recommended by your primary care doctor. Keep all scheduled follow-up appointments as recommended.  Comments:  Patient is instructed prior to discharge to: Take all medications as prescribed by his/her mental healthcare provider. Report any adverse effects and or reactions from the medicines to his/her outpatient provider promptly. Patient has been instructed & cautioned: To not engage in alcohol and or illegal drug use while on prescription medicines. In the event of worsening symptoms, patient is instructed to call the crisis hotline, 911 and or go to the nearest ED for appropriate  evaluation and treatment of symptoms. To follow-up with his/her primary care provider for your other medical issues, concerns and or health care needs.  Signed: Armandina Stammer, NP, PMHNP, FNP-BC. 03/15/2018, 11:10 AM   Patient seen, Suicide Assessment Completed.  Disposition Plan Reviewed

## 2018-03-15 NOTE — Progress Notes (Signed)
Recreation Therapy Notes  Date: 4.19.19 Time: 9:30 a.m. Location: [address] Dayroom   Group Topic: Stress Management   Goal Area(s) Addresses:  Goal 1.1: To reduce stress  -Patient will feel a reduction in stress level  -Patient will understand the importance of stress management  -Patient will participate during stress management group      Intervention: Stress Management  Activity: Meditation: Patients were in a peaceful environment with soft lighting enhancing patients mood. Patients listened to a body scan meditation from the calm app to help release tension and stress.  Education: Stress Management, Discharge Planning.    Education Outcome: Acknowledges edcuation/In group clarification offered/Needs additional education   Clinical Observations/Feedback:: Patient did not attend     Sheryle HailDarian Ardell Aaronson, Recreation Therapy Intern   Sheryle HailDarian Antaeus Karel 03/15/2018 9:44 AM

## 2018-03-15 NOTE — Progress Notes (Signed)
D: Patient presents calm and cooperative. Denies AVH, HI and SI. Goal for today: "To get out of this place" and "stay positive and wait until it time to get out." Patient denies feeling depressed, hopeless or anxious today (scored 0 out of 10 on self inventory). Patient slept well last night, took medication to help with sleep. Med compliant. Normal energy level and good concentration. No pain or medication side effects reported. Patient plans to return home to live with friends, and concerns of being evicted have been resolved. Financial stressors are not a concern at this time to patient, and were a major stressor. Superficial lacerations to bilateral forearms are healing well. Vitals WNL.  A: RN educated patient on medications, importance of follow up care. [facility] appointment on 22nd at 0800. Safety precautions reviewed, including suicide awareness and prevention. Medications given to patient, SRA, and discharge instructions reviewed. All belongings from locker #37 returned to patient at discharge. Patient received pneumovax in Dec 2014 and administration not appropriate. R: Patient contracts for safety after discharge. Patient signed belonging sheet. Patient plans to return home to live with "friends". Patient acknowledges understanding of follow up care.

## 2018-03-15 NOTE — [facility] Group Notes (Signed)
LCSW Group Therapy Note 03/15/2018 9:53 AM  Type of Therapy and Topic: Group Therapy: Avoiding Self-Sabotaging and Enabling Behaviors  Participation Level: None  Description of Group:  In this group, patients will learn how to identify obstacles, self-sabotaging and enabling behaviors, as well as: what are they, why do we do them and what needs these behaviors meet. Discuss unhealthy relationships and how to have positive healthy boundaries with those that sabotage and enable. Explore aspects of self-sabotage and enabling in yourself and how to limit these self-destructive behaviors in everyday life.  Therapeutic Goals: 1. Patient will identify one obstacle that relates to self-sabotage and enabling behaviors 2. Patient will identify one personal self-sabotaging or enabling behavior they did prior to admission 3. Patient will state a plan to change the above identified behavior 4. Patient will demonstrate ability to communicate their needs through discussion and/or role play.   Summary of Patient Progress:  [name] was in the room during the group session, however he did not engage or participate.     Therapeutic Modalities:  Cognitive Behavioral Therapy Person-Centered Therapy Motivational Interviewing   Baldo DaubJolan Belmira Daley LCSWA Clinical Social Worker

## 2018-03-15 NOTE — Progress Notes (Signed)
Pt attended orientation/goals group and stated his goal for the day is to stay awake. Pt is feeling drowsy due to medication.

## 2018-08-27 ENCOUNTER — Emergency Department (HOSPITAL_COMMUNITY)
Admission: EM | Admit: 2018-08-27 | Discharge: 2018-08-28 | Disposition: A | Discharge status: Other Acute Inpatient Hospital | Payer: Self-pay | Attending: Emergency Medicine | Admitting: Emergency Medicine

## 2018-08-27 ENCOUNTER — Other Ambulatory Visit: Payer: Self-pay

## 2018-08-27 ENCOUNTER — Encounter (HOSPITAL_COMMUNITY): Payer: Self-pay | Admitting: Emergency Medicine

## 2018-08-27 DIAGNOSIS — S40812A Abrasion of left upper arm, initial encounter: Secondary | ICD-10-CM | POA: Insufficient documentation

## 2018-08-27 DIAGNOSIS — Y929 Unspecified place or not applicable: Secondary | ICD-10-CM | POA: Insufficient documentation

## 2018-08-27 DIAGNOSIS — Y999 Unspecified external cause status: Secondary | ICD-10-CM | POA: Insufficient documentation

## 2018-08-27 DIAGNOSIS — X838XXA Intentional self-harm by other specified means, initial encounter: Secondary | ICD-10-CM | POA: Insufficient documentation

## 2018-08-27 DIAGNOSIS — R45851 Suicidal ideations: Secondary | ICD-10-CM | POA: Insufficient documentation

## 2018-08-27 DIAGNOSIS — IMO0002 Reserved for concepts with insufficient information to code with codable children: Secondary | ICD-10-CM

## 2018-08-27 DIAGNOSIS — Z23 Encounter for immunization: Secondary | ICD-10-CM | POA: Insufficient documentation

## 2018-08-27 DIAGNOSIS — F17210 Nicotine dependence, cigarettes, uncomplicated: Secondary | ICD-10-CM | POA: Insufficient documentation

## 2018-08-27 DIAGNOSIS — Z7289 Other problems related to lifestyle: Secondary | ICD-10-CM | POA: Insufficient documentation

## 2018-08-27 DIAGNOSIS — S40811A Abrasion of right upper arm, initial encounter: Secondary | ICD-10-CM | POA: Insufficient documentation

## 2018-08-27 DIAGNOSIS — F329 Major depressive disorder, single episode, unspecified: Secondary | ICD-10-CM | POA: Insufficient documentation

## 2018-08-27 DIAGNOSIS — Y939 Activity, unspecified: Secondary | ICD-10-CM | POA: Insufficient documentation

## 2018-08-27 DIAGNOSIS — Z79899 Other long term (current) drug therapy: Secondary | ICD-10-CM | POA: Insufficient documentation

## 2018-08-27 LAB — CBC WITH DIFFERENTIAL/PLATELET
BASOS PCT: 0 %
Basophils Absolute: 0 10*3/uL (ref 0.0–0.1)
Eosinophils Absolute: 0.1 10*3/uL (ref 0.0–0.7)
Eosinophils Relative: 1 %
HEMATOCRIT: 46 % (ref 39.0–52.0)
Hemoglobin: 16 g/dL (ref 13.0–17.0)
Lymphocytes Relative: 21 %
Lymphs Abs: 2.6 10*3/uL (ref 0.7–4.0)
MCH: 30 pg (ref 26.0–34.0)
MCHC: 34.8 g/dL (ref 30.0–36.0)
MCV: 86.3 fL (ref 78.0–100.0)
MONO ABS: 0.9 10*3/uL (ref 0.1–1.0)
MONOS PCT: 7 %
Neutro Abs: 8.3 10*3/uL — ABNORMAL HIGH (ref 1.7–7.7)
Neutrophils Relative %: 71 %
Platelets: 243 10*3/uL (ref 150–400)
RBC: 5.33 MIL/uL (ref 4.22–5.81)
RDW: 13.1 % (ref 11.5–15.5)
WBC: 11.9 10*3/uL — ABNORMAL HIGH (ref 4.0–10.5)

## 2018-08-27 LAB — COMPREHENSIVE METABOLIC PANEL
ALK PHOS: 72 U/L (ref 38–126)
ALT: 17 U/L (ref 0–44)
AST: 17 U/L (ref 15–41)
Albumin: 4.1 g/dL (ref 3.5–5.0)
Anion gap: 10 (ref 5–15)
BUN: 13 mg/dL (ref 6–20)
CALCIUM: 9.3 mg/dL (ref 8.9–10.3)
CO2: 23 mmol/L (ref 22–32)
CREATININE: 0.87 mg/dL (ref 0.61–1.24)
Chloride: 103 mmol/L (ref 98–111)
GFR calc Af Amer: >60 mL/min (ref >60)
GFR calc non Af Amer: >60 mL/min (ref >60)
Glucose, Bld: 100 mg/dL — ABNORMAL HIGH (ref 70–99)
Potassium: 3.5 mmol/L (ref 3.5–5.1)
SODIUM: 136 mmol/L (ref 135–145)
Total Bilirubin: 0.6 mg/dL (ref 0.3–1.2)
Total Protein: 7.4 g/dL (ref 6.5–8.1)

## 2018-08-27 LAB — RAPID URINE DRUG SCREEN, HOSP PERFORMED
Amphetamines: NOT DETECTED
Barbiturates: NOT DETECTED
Benzodiazepines: NOT DETECTED
Cocaine: NOT DETECTED
OPIATES: NOT DETECTED
TETRAHYDROCANNABINOL: POSITIVE — AB

## 2018-08-27 LAB — ETHANOL: Alcohol, Ethyl (B): <10 mg/dL (ref <10)

## 2018-08-27 MED ORDER — ARIPIPRAZOLE 5 MG PO TABS
5.0000 mg | ORAL_TABLET | Freq: Every day | ORAL | Status: DC
  Administered 2018-08-27: 5 mg via ORAL
  Filled 2018-08-27: qty 1

## 2018-08-27 MED ORDER — TRAZODONE HCL 50 MG PO TABS: 100.0000 mg | ORAL_TABLET | Freq: Every evening | ORAL | Status: DC | PRN

## 2018-08-27 MED ORDER — ACETAMINOPHEN 325 MG PO TABS: 650.0000 mg | ORAL_TABLET | ORAL | Status: DC | PRN

## 2018-08-27 MED ORDER — NICOTINE 21 MG/24HR TD PT24
21.0000 mg | MEDICATED_PATCH | Freq: Every day | TRANSDERMAL | Status: DC
  Administered 2018-08-27: 21 mg via TRANSDERMAL
  Filled 2018-08-27: qty 1

## 2018-08-27 MED ORDER — ALUM & MAG HYDROXIDE-SIMETH 200-200-20 MG/5ML PO SUSP: 30.0000 mL | Freq: Four times a day (QID) | ORAL | Status: DC | PRN

## 2018-08-27 MED ORDER — TETANUS-DIPHTH-ACELL PERTUSSIS 5-2.5-18.5 LF-MCG/0.5 IM SUSP
0.5000 mL | Freq: Once | INTRAMUSCULAR | Status: AC
  Administered 2018-08-27: 0.5 mL via INTRAMUSCULAR
  Filled 2018-08-27: qty 0.5

## 2018-08-27 MED ORDER — FLUOXETINE HCL 20 MG PO CAPS
20.0000 mg | ORAL_CAPSULE | Freq: Every day | ORAL | Status: DC
  Administered 2018-08-27: 20 mg via ORAL
  Filled 2018-08-27: qty 1

## 2018-08-27 MED ORDER — HYDROXYZINE HCL 25 MG PO TABS: 25.0000 mg | ORAL_TABLET | Freq: Three times a day (TID) | ORAL | Status: DC | PRN

## 2018-08-27 MED ORDER — FAMOTIDINE 20 MG PO TABS
20.0000 mg | ORAL_TABLET | Freq: Two times a day (BID) | ORAL | Status: DC
  Administered 2018-08-27: 20 mg via ORAL
  Filled 2018-08-27: qty 1

## 2018-08-27 NOTE — BH Assessment (Signed)
Tele Assessment Note   Patient Name: Angel [name] MRN: [id number] Referring Physician: Dr. Effie Shy Location of Patient: [facility] bed [facility] Location of Provider: Behavioral Health TTS Department  Angel [name] is an 37 y.o. male presenting with SI with plan to slit wrist or hang himself, after calling police. Patient presented with superficial cuts to bilateral arms. Patient reports onset 2 months ago, which is after brother committed suicide. Patient currently seeing psychiatrist Delice Bison at [facility]. Patient reported compliance to meds, however meds are not working. Patient was inpatient 03/09/18 at [facility] for SI and cutting his wrist and in 2018.  Pt states he's attempted suicide approximately 4-5 times. He estimates he's been in the hospital 5-6 times. Patient reports cutting wrist when he gets upset. Patient reports depressive symptoms crying spell, no sleep at night, sleep a lot during the days and not wanting to get out of bed. Patient reports no family support. Patient is voluntary. Patient denies HI and psychosis. Patient was cooperative during assessment.UDS+ marijuana and BAL negative.  Disposition Initial Assessment Completed for this Encounter: Yes  Nira Conn, NP, patient meets inpatient criteria. Tori, [facility], accepted patient for admission to [facility] with arrival after midnight. Doctor and RN notified of disposition.  Diagnosis: Anxiety disorder and Major depressive disorder  Past Medical History:  Past Medical History:  Diagnosis Date  . Anxiety   . Depression   . Migraine     History reviewed. No pertinent surgical history.  Family History:  Family History  Problem Relation Age of Onset  . Cancer Mother        brain  . Diabetes Brother   . Hypertension Brother     Social History:  reports that he has been smoking cigarettes. He has a 30.00 pack-year smoking history. He has never used smokeless tobacco. He reports that he does not drink alcohol or use  drugs.  Additional Social History:  Alcohol / Drug Use Pain Medications: see MAR Prescriptions: see MAR Over the Counter: see MAR  CIWA: CIWA-Ar BP: 114/82 Pulse Rate: (!) 122 COWS:    Allergies: No Known Allergies  Home Medications:  (Not in a hospital admission)  OB/GYN Status:  No LMP for male patient.  General Assessment Data Location of Assessment: [facility] TTS Assessment: In system Is this a Tele or Face-to-Face Assessment?: Tele Assessment Is this an Initial Assessment or a Re-assessment for this encounter?: Initial Assessment Patient Accompanied by:: N/A Language Other than English: No Living Arrangements: (lives together with friend) What gender do you identify as?: Male Marital status: Single Living Arrangements: Non-relatives/Friends Can pt return to current living arrangement?: Yes Admission Status: Voluntary Is patient capable of signing voluntary admission?: Yes Referral Source: Self/Family/Friend     Crisis Care Plan Living Arrangements: Non-relatives/Friends Legal Guardian: (self) Name of Psychiatrist: Delice Bison at [facility]) Name of Therapist: (none)  Education Status Is patient currently in school?: No Is the patient employed, unemployed or receiving disability?: Unemployed  Risk to self with the past 6 months Suicidal Ideation: Yes-Currently Present Has patient been a risk to self within the past 6 months prior to admission? : Yes Suicidal Intent: Yes-Currently Present Has patient had any suicidal intent within the past 6 months prior to admission? : Yes Is patient at risk for suicide?: Yes Suicidal Plan?: Yes-Currently Present Has patient had any suicidal plan within the past 6 months prior to admission? : Yes Specify Current Suicidal Plan: (slit wrist or hang himself) Access to Means: Yes Specify  Access to Suicidal Means: (knife and ropelike material in the home) What has been your use of drugs/alcohol within the last 12 months?:  (none) Previous Attempts/Gestures: Yes How many times?: (1) Other Self Harm Risks: (cutting self when angry) Triggers for Past Attempts: (grief loss brother committed suicide 2 months agl) Intentional Self Injurious Behavior: Cutting Comment - Self Injurious Behavior: (cuts arms when angry) Family Suicide History: Yes(brother 2 months ago) Recent stressful life event(s): Loss (Comment)(brother committed suicide 2 months ago) Persecutory voices/beliefs?: No Depression: Yes Depression Symptoms: Tearfulness, Insomnia, Feeling worthless/self pity, Loss of interest in usual pleasures, Fatigue Substance abuse history and/or treatment for substance abuse?: No  Risk to Others within the past 6 months Homicidal Ideation: No Does patient have any lifetime risk of violence toward others beyond the six months prior to admission? : No Thoughts of Harm to Others: No Current Homicidal Intent: No Current Homicidal Plan: No Access to Homicidal Means: No History of harm to others?: No Assessment of Violence: None Noted Does patient have access to weapons?: No Criminal Charges Pending?: No Does patient have a court date: No Is patient on probation?: No  Psychosis Hallucinations: None noted Delusions: None noted  Mental Status Report Appearance/Hygiene: Unremarkable Eye Contact: Fair Motor Activity: Unremarkable Speech: Logical/coherent Level of Consciousness: Alert Mood: Depressed, Helpless, Sad Affect: Depressed, Sad Anxiety Level: Moderate Thought Processes: Coherent Judgement: Impaired Orientation: Person, Place, Time, Situation Obsessive Compulsive Thoughts/Behaviors: None  Cognitive Functioning Concentration: Fair Memory: Recent Intact Is patient IDD: No Insight: Poor Impulse Control: Poor Appetite: Good Have you had any weight changes? : No Change Sleep: Decreased Total Hours of Sleep: (4) Vegetative Symptoms: Staying in bed  ADLScreening [facility] Assessment  Services) Patient's cognitive ability adequate to safely complete daily activities?: Yes Patient able to express need for assistance with ADLs?: Yes Independently performs ADLs?: Yes (appropriate for developmental age)  Prior Inpatient Therapy Prior Inpatient Therapy: Yes([facility] 02/2018) Prior Therapy Dates: (02/2018) Prior Therapy Facilty/Provider(s): ([facility] 02/2018) Reason for Treatment: (suicidal thoughts)  Prior Outpatient Therapy Prior Outpatient Therapy: No Does patient have an ACCT team?: No Does patient have Intensive In-House Services?  : No Does patient have [facility] services? : No Does patient have P4CC services?: No  ADL Screening (condition at time of admission) Patient's cognitive ability adequate to safely complete daily activities?: Yes Patient able to express need for assistance with ADLs?: Yes Independently performs ADLs?: Yes (appropriate for developmental age)       Advance Directives (For Healthcare) Does Patient Have a Medical Advance Directive?: No   Disposition:  Disposition Initial Assessment Completed for this Encounter: Yes  Nira Conn, NP, patient meets inpatient criteria. Tori, [facility], accepted patient for admission to [facility] with arrival after midnight. Doctor and RN notified of disposition.  This service was provided via telemedicine using a 2-way, interactive audio and video technology.  Names of all persons participating in this telemedicine service and their role in this encounter. Name: Angel [name] Role: patient  Name: Al Corpus, [facility] Role: TTS Clinician  Name:  Role:   Name:  Role:     Burnetta Sabin 08/27/2018 7:34 PM

## 2018-08-27 NOTE — ED Notes (Signed)
TTS in progress 

## 2018-08-27 NOTE — ED Notes (Signed)
Pt wanded by security. 

## 2018-08-27 NOTE — ED Notes (Signed)
Pt's belongings placed in psych lockers.

## 2018-08-27 NOTE — ED Notes (Signed)
Pt given dinner tray.

## 2018-08-27 NOTE — ED Triage Notes (Signed)
Brought in by [employer] office.  Pt called pd d/t SI.  Pt has superficial cuts to bilateral arms. Pt calm and cooperative at this time.  Pt admits to suicidal thoughts.  Brother recently committed suicide.

## 2018-08-27 NOTE — ED Notes (Signed)
Nira Conn, NP, patient meets inpatient criteria. Tori, [facility], accepted patient for admission to [facility] with arrival after midnight. Doctor and RN notified of disposition.   [facility] Adult Unit Arrival Time: after midnight Accepting Provider: Nira Conn, NP Attending MD: Dr. Jama Flavors Room #: 405 Bed 1

## 2018-08-27 NOTE — ED Provider Notes (Addendum)
[facility] EMERGENCY DEPARTMENT Provider Note   CSN: [id number] Arrival date & time: 08/27/18  1621     History   Chief Complaint Chief Complaint  Patient presents with  . V70.1    HPI Angel [name] is a 37 y.o. male.  HPI   Patient presents for evaluation of suicidal ideation which is currently present, and he alleges related to the death of his brother but suicide 2 months ago.  He is taking his usual medicine prescribed by his psychiatrist and he has not seen recently.  States the medicines are "not working."  He denies overdose on pills.  He scratched his arms because of his feelings of suicide.  He denies recent illnesses.  He is here seeking help.  He does not use drugs or alcohol by his report.  He denies recent chest pain, shortness of breath, nausea, vomiting, fever or chills.  There are no other known modifying factors.  Past Medical History:  Diagnosis Date  . Anxiety   . Depression   . Migraine     Patient Active Problem List   Diagnosis Date Noted  . GERD (gastroesophageal reflux disease) 03/11/2018  . Severe recurrent major depression without psychotic features (HCC) 03/09/2018  . Major depressive disorder, recurrent episode (HCC) 05/16/2017  . Tobacco use disorder 07/17/2015  . Headache syndrome 07/17/2015  . Nicotine dependence 07/17/2015  . Generalized anxiety disorder 11/01/2013  . Major depressive disorder, recurrent episode, severe, without mention of psychotic behavior 10/31/2013    History reviewed. No pertinent surgical history.      Home Medications    Prior to Admission medications   Medication Sig Start Date End Date Taking? Authorizing Provider  citalopram (CELEXA) 20 MG tablet Take 20 mg by mouth daily.   Yes [provider]  nicotine (NICODERM CQ - DOSED IN MG/24 HOURS) 21 mg/24hr patch Place 1 patch (21 mg total) onto the skin daily. (May buy from over the counter): For smoking cessation Patient not taking: Reported on  08/27/2018 03/16/18   Sanjuana Kava, NP    Family History Family History  Problem Relation Age of Onset  . Cancer Mother        brain  . Diabetes Brother   . Hypertension Brother     Social History Social History   Tobacco Use  . Smoking status: Current Every Day Smoker    Packs/day: 1.50    Years: 20.00    Pack years: 30.00    Types: Cigarettes  . Smokeless tobacco: Never Used  Substance Use Topics  . Alcohol use: No  . Drug use: No     Allergies   Patient has no known allergies.   Review of Systems Review of Systems  All other systems reviewed and are negative.    Physical Exam Updated Vital Signs BP 114/82 (BP Location: Right Arm)   Pulse (!) 122   Temp 99.2 F (37.3 C) (Oral)   Resp 18   Ht 5\' 8"  (1.727 m)   Wt 77.1 kg   SpO2 96%   BMI 25.85 kg/m   Physical Exam  Constitutional: He is oriented to person, place, and time. He appears well-developed and well-nourished. He appears distressed (He is uncomfortable).  HENT:  Head: Normocephalic and atraumatic.  Right Ear: External ear normal.  Left Ear: External ear normal.  Eyes: Pupils are equal, round, and reactive to light. Conjunctivae and EOM are normal.  Neck: Normal range of motion and phonation normal. Neck supple.  Cardiovascular:  Normal rate.  Pulmonary/Chest: Effort normal. He exhibits no bony tenderness.  Musculoskeletal: Normal range of motion. He exhibits no tenderness or deformity.  Neurological: He is alert and oriented to person, place, and time. No cranial nerve deficit or sensory deficit. He exhibits normal muscle tone. Coordination normal.  Skin: Skin is warm, dry and intact.  Multiple superficial abrasions on the dorsum of both forearms.  No volar injuries.  Wounds are not in need of surgical/suture repair.  Psychiatric: He has a normal mood and affect. His behavior is normal. Judgment and thought content normal.  Nursing note and vitals reviewed.    ED Treatments / Results    Labs (all labs ordered are listed, but only abnormal results are displayed) Labs Reviewed  COMPREHENSIVE METABOLIC PANEL - Abnormal; Notable for the following components:      Result Value   Glucose, Bld 100 (*)    All other components within normal limits  CBC WITH DIFFERENTIAL/PLATELET - Abnormal; Notable for the following components:   WBC 11.9 (*)    Neutro Abs 8.3 (*)    All other components within normal limits  RAPID URINE DRUG SCREEN, HOSP PERFORMED - Abnormal; Notable for the following components:   Tetrahydrocannabinol POSITIVE (*)    All other components within normal limits  ETHANOL    EKG None  Radiology No results found.  Procedures Procedures (including critical care time)  Medications Ordered in ED Medications  alum & mag hydroxide-simeth (MAALOX/MYLANTA) 200-200-20 MG/5ML suspension 30 mL (has no administration in time range)  nicotine (NICODERM CQ - dosed in mg/24 hours) patch 21 mg (21 mg Transdermal Patch Applied 08/27/18 1743)  acetaminophen (TYLENOL) tablet 650 mg (has no administration in time range)  ARIPiprazole (ABILIFY) tablet 5 mg (5 mg Oral Given 08/27/18 2117)  famotidine (PEPCID) tablet 20 mg (20 mg Oral Given 08/27/18 2117)  FLUoxetine (PROZAC) capsule 20 mg (20 mg Oral Given 08/27/18 1743)  hydrOXYzine (ATARAX/VISTARIL) tablet 25 mg (has no administration in time range)  traZODone (DESYREL) tablet 100 mg (has no administration in time range)  Tdap (BOOSTRIX) injection 0.5 mL (0.5 mLs Intramuscular Given 08/27/18 1742)     Initial Impression / Assessment and Plan / ED Course  I have reviewed the triage vital signs and the nursing notes.  Pertinent labs & imaging results that were available during my care of the patient were reviewed by me and considered in my medical decision making (see chart for details).  Clinical Course as of Aug 27 2122  Tue Aug 27, 2018  2122 Normal except THC present  Urine rapid drug screen (hosp performed)(!) [EW]   2122 Normal  Ethanol [EW]  2123 Normal except white count high.  CBC with Differential(!) [EW]  2123 Normal except glucose minimally elevated at 100  Comprehensive metabolic panel(!) [EW]  2123 At this point the patient is medically cleared for treatment by psychiatry.   [EW]    Clinical Course User Index [EW] Mancel Bale, MD     Patient Vitals for the past 24 hrs:  BP Temp Temp src Pulse Resp SpO2 Height Weight  08/27/18 1636 - - - - - - 5\' 8"  (1.727 m) 77.1 kg  08/27/18 1634 114/82 99.2 F (37.3 C) Oral (!) 122 18 96 % - -   5:30 PM-TTS consultation  Medical Decision Making: Suicidal ideation, recurrent.  Stressors include brothers death by suicide recently.  Patient is here voluntarily.  Superficial injuries to the arm likely call for help, unlikely to cause long-term  disability.  No other reported self injury.  Patient is lucid, not intoxicated, and has normal hemodynamic status.  CRITICAL CARE-no Performed by: Mancel Bale   Nursing Notes Reviewed/ Care Coordinated Applicable Imaging Reviewed Interpretation of Laboratory Data incorporated into ED treatment   Plan-as per TTS in conjunction with oncoming provider team    Final Clinical Impressions(s) / ED Diagnoses   Final diagnoses:  Suicidal ideation  Self-inflicted injury  Abrasion of right upper extremity, initial encounter  Abrasion of left upper extremity, initial encounter    ED Discharge Orders    None       Mancel Bale, MD 08/27/18 1731    Mancel Bale, MD 08/27/18 2124

## 2018-08-28 ENCOUNTER — Inpatient Hospital Stay (HOSPITAL_COMMUNITY)
Admission: AD | Admit: 2018-08-28 | Discharge: 2018-09-02 | DRG: 885 | Disposition: A | Discharge status: Home / Self Care | Payer: Federal, State, Local not specified - Other | Source: Intra-hospital | Attending: Psychiatry | Admitting: Psychiatry

## 2018-08-28 ENCOUNTER — Encounter (HOSPITAL_COMMUNITY): Payer: Self-pay | Admitting: Nurse Practitioner

## 2018-08-28 ENCOUNTER — Other Ambulatory Visit: Payer: Self-pay

## 2018-08-28 DIAGNOSIS — F515 Nightmare disorder: Secondary | ICD-10-CM | POA: Diagnosis present

## 2018-08-28 DIAGNOSIS — F4000 Agoraphobia, unspecified: Secondary | ICD-10-CM | POA: Diagnosis present

## 2018-08-28 DIAGNOSIS — F17210 Nicotine dependence, cigarettes, uncomplicated: Secondary | ICD-10-CM | POA: Diagnosis present

## 2018-08-28 DIAGNOSIS — R45851 Suicidal ideations: Secondary | ICD-10-CM | POA: Diagnosis present

## 2018-08-28 DIAGNOSIS — X789XXA Intentional self-harm by unspecified sharp object, initial encounter: Secondary | ICD-10-CM

## 2018-08-28 DIAGNOSIS — Z818 Family history of other mental and behavioral disorders: Secondary | ICD-10-CM | POA: Diagnosis not present

## 2018-08-28 DIAGNOSIS — F332 Major depressive disorder, recurrent severe without psychotic features: Secondary | ICD-10-CM | POA: Diagnosis present

## 2018-08-28 DIAGNOSIS — Z808 Family history of malignant neoplasm of other organs or systems: Secondary | ICD-10-CM | POA: Diagnosis not present

## 2018-08-28 DIAGNOSIS — Z833 Family history of diabetes mellitus: Secondary | ICD-10-CM | POA: Diagnosis not present

## 2018-08-28 DIAGNOSIS — Z23 Encounter for immunization: Secondary | ICD-10-CM

## 2018-08-28 DIAGNOSIS — Z915 Personal history of self-harm: Secondary | ICD-10-CM

## 2018-08-28 DIAGNOSIS — F411 Generalized anxiety disorder: Secondary | ICD-10-CM | POA: Diagnosis present

## 2018-08-28 DIAGNOSIS — Z8249 Family history of ischemic heart disease and other diseases of the circulatory system: Secondary | ICD-10-CM

## 2018-08-28 DIAGNOSIS — K219 Gastro-esophageal reflux disease without esophagitis: Secondary | ICD-10-CM | POA: Diagnosis present

## 2018-08-28 DIAGNOSIS — Z79899 Other long term (current) drug therapy: Secondary | ICD-10-CM | POA: Diagnosis not present

## 2018-08-28 DIAGNOSIS — G4700 Insomnia, unspecified: Secondary | ICD-10-CM | POA: Diagnosis present

## 2018-08-28 DIAGNOSIS — F419 Anxiety disorder, unspecified: Secondary | ICD-10-CM

## 2018-08-28 LAB — LIPID PANEL
CHOL/HDL RATIO: 3.7 ratio
Cholesterol: 171 mg/dL (ref 0–200)
HDL: 46 mg/dL (ref >40)
LDL Cholesterol: 104 mg/dL — ABNORMAL HIGH (ref 0–99)
Triglycerides: 107 mg/dL (ref <150)
VLDL: 21 mg/dL (ref 0–40)

## 2018-08-28 LAB — TSH: TSH: 1.582 u[IU]/mL (ref 0.350–4.500)

## 2018-08-28 MED ORDER — MAGNESIUM HYDROXIDE 400 MG/5ML PO SUSP: 30.0000 mL | Freq: Every day | ORAL | Status: DC | PRN

## 2018-08-28 MED ORDER — FAMOTIDINE 20 MG PO TABS
20.0000 mg | ORAL_TABLET | Freq: Two times a day (BID) | ORAL | Status: DC
  Administered 2018-08-28 – 2018-09-02 (×11): 20 mg via ORAL
  Filled 2018-08-28: qty 1
  Filled 2018-08-28: qty 14
  Filled 2018-08-28 (×4): qty 1
  Filled 2018-08-28: qty 14
  Filled 2018-08-28 (×9): qty 1

## 2018-08-28 MED ORDER — TRAZODONE HCL 100 MG PO TABS
100.0000 mg | ORAL_TABLET | Freq: Every evening | ORAL | Status: DC | PRN
  Administered 2018-08-28: 100 mg via ORAL
  Filled 2018-08-28: qty 1

## 2018-08-28 MED ORDER — ACETAMINOPHEN 325 MG PO TABS: 650.0000 mg | ORAL_TABLET | Freq: Four times a day (QID) | ORAL | Status: DC | PRN

## 2018-08-28 MED ORDER — HYDROXYZINE HCL 25 MG PO TABS
25.0000 mg | ORAL_TABLET | Freq: Three times a day (TID) | ORAL | Status: DC | PRN
  Administered 2018-08-28 – 2018-09-01 (×3): 25 mg via ORAL
  Filled 2018-08-28 (×3): qty 1

## 2018-08-28 MED ORDER — NICOTINE 21 MG/24HR TD PT24
21.0000 mg | MEDICATED_PATCH | Freq: Every day | TRANSDERMAL | Status: DC
  Administered 2018-08-28 – 2018-09-02 (×6): 21 mg via TRANSDERMAL
  Filled 2018-08-28 (×8): qty 1

## 2018-08-28 MED ORDER — ARIPIPRAZOLE 5 MG PO TABS
5.0000 mg | ORAL_TABLET | Freq: Every day | ORAL | Status: DC
  Administered 2018-08-28 – 2018-09-01 (×5): 5 mg via ORAL
  Filled 2018-08-28: qty 7
  Filled 2018-08-28 (×6): qty 1

## 2018-08-28 MED ORDER — ALUM & MAG HYDROXIDE-SIMETH 200-200-20 MG/5ML PO SUSP: 30.0000 mL | ORAL | Status: DC | PRN

## 2018-08-28 MED ORDER — FLUOXETINE HCL 20 MG PO CAPS
20.0000 mg | ORAL_CAPSULE | Freq: Every day | ORAL | Status: DC
  Administered 2018-08-28 – 2018-09-02 (×6): 20 mg via ORAL
  Filled 2018-08-28 (×6): qty 1
  Filled 2018-08-28: qty 7
  Filled 2018-08-28: qty 1

## 2018-08-28 MED ORDER — INFLUENZA VAC SPLIT QUAD 0.5 ML IM SUSY
0.5000 mL | PREFILLED_SYRINGE | INTRAMUSCULAR | Status: AC
  Administered 2018-08-29: 0.5 mL via INTRAMUSCULAR
  Filled 2018-08-28: qty 0.5

## 2018-08-28 NOTE — Progress Notes (Signed)
D: Patient has been isolative to his room.  He is guarded and minimal with staff and peers.  Patient has some superficial cuts bilateral arms.  Patient rates his depression, hopelessness and anxiety as a 9.  He reports poor sleep and fair appetite.  His energy level is low and his concentration is poor.  His affect is flat; blunted.  Patient's mood continues to be depressed.  He denies any thoughts of self harm today.  A: Continue to monitor medication management and MD orders.  Safety checks completed every 15 minutes per protocol.  Offer support and encouragement as needed.  R: Patient is receptive to staff; his behavior is appropriate.

## 2018-08-28 NOTE — Progress Notes (Signed)
D: Pt denies SI/HI/AVH. Pt is pleasant and cooperative. Pt stayed in room most of the evening, pt stated he felt the "same " as he did when he came in.  A: Pt was offered support and encouragement. Pt was given scheduled medications. Pt was encourage to attend groups. Q 15 minute checks were done for safety.  R:. Pt has no complaints.Pt receptive to treatment and safety maintained on unit.  Problem: Education: Goal: Emotional status will improve Outcome: Not Progressing   Problem: Education: Goal: Mental status will improve Outcome: Not Progressing   Problem: Activity: Goal: Interest or engagement in activities will improve Outcome: Not Progressing   Problem: Activity: Goal: Sleeping patterns will improve Outcome: Progressing

## 2018-08-28 NOTE — Progress Notes (Signed)
Patient ID: Angel [name], male   DOB: [birth year], 37 y.o.   MRN: [id number] Angel [name] a a 37 year old male admitted voluntarily to [facility] from [facility] hospital for +SI with plans to overdose on pills, slit his wrists or hang himself.  Pt with multiple cuts to his b/l arms, and states he used a razor yesterday to do this.  Pt self reports a history of anxiety and ADD, and states he has been to [facility] several times, with his last hospitalization here being in April 2019. Pt calm and cooperative with admission process, and currently denies SI/HI/AVH, and verbally contracts for safety on the unit.  Q15 minute safety checks initiated for safety.  Pt not forthcoming when asked what his stressors are, but as per documentation, pt's brother committed suicide 2 months ago.  Pt denies having any family support, and currently denies SI/HI/AVH, but affect is blunted and mood depressed.  Pt verbally contracts for safety on the unit, Q15 minute safety checks initiated.

## 2018-08-28 NOTE — BHH Suicide Risk Assessment (Signed)
[facility] Admission Suicide Risk Assessment   Nursing information obtained from:  Patient Demographic factors:  Male Current Mental Status:  Suicidal ideation indicated by patient Loss Factors:  NA Historical Factors:  Family history of suicide Risk Reduction Factors:  NA  Total Time spent with patient: 45 minutes Principal Problem:  MDD  Diagnosis:   Patient Active Problem List   Diagnosis Date Noted  . GERD (gastroesophageal reflux disease) [K21.9] 03/11/2018  . Severe recurrent major depression without psychotic features (HCC) [F33.2] 03/09/2018  . Major depressive disorder, recurrent episode (HCC) [F33.9] 05/16/2017  . Tobacco use disorder [F17.200] 07/17/2015  . Headache syndrome [G44.89] 07/17/2015  . Nicotine dependence [F17.200] 07/17/2015  . Generalized anxiety disorder [F41.1] 11/01/2013  . Major depressive disorder, recurrent episode, severe, without mention of psychotic behavior [F33.2] 10/31/2013   Subjective Data:   Continued Clinical Symptoms:  Alcohol Use Disorder Identification Test Final Score (AUDIT): 0 The "Alcohol Use Disorders Identification Test", Guidelines for Use in Primary Care, Second Edition.  World Science writer [facility]). Score between 0-7:  no or low risk or alcohol related problems. Score between 8-15:  moderate risk of alcohol related problems. Score between 16-19:  high risk of alcohol related problems. Score 20 or above:  warrants further diagnostic evaluation for alcohol dependence and treatment.   CLINICAL FACTORS:  37 year old male, presented for worsening depression, neuro-vegetative symptoms of depression, suicidal ideations, episode of self cutting on forearms. Reports history of chronic depression, which has worsened after death of his brother earlier this year, who committed suicide.    Psychiatric Specialty Exam: Physical Exam  ROS  Blood pressure 93/76, pulse 100, temperature 98.5 F (36.9 C), resp. rate 20, height 5\' 8"  (1.727 m),  weight 77.1 kg, SpO2 97 %.Body mass index is 25.85 kg/m.  See MSE admit note    COGNITIVE FEATURES THAT CONTRIBUTE TO RISK:  Closed-mindedness and Loss of executive function    SUICIDE RISK:   Moderate:  Frequent suicidal ideation with limited intensity, and duration, some specificity in terms of plans, no associated intent, good self-control, limited dysphoria/symptomatology, some risk factors present, and identifiable protective factors, including available and accessible social support.  PLAN OF CARE: Patient will be admitted to inpatient psychiatric unit for stabilization and safety. Will provide and encourage milieu participation. Provide medication management and maked adjustments as needed.  Will follow daily.    I certify that inpatient services furnished can reasonably be expected to improve the patient's condition.   Craige Cotta, MD 08/28/2018, 3:36 PM

## 2018-08-28 NOTE — Progress Notes (Signed)
Nutrition Brief Note  Patient identified on the Malnutrition Screening Tool (MST) Report  Patient with insignificant weight loss.  Wt Readings from Last 15 Encounters:  08/28/18 77.1 kg  08/27/18 77.1 kg  03/09/18 81.2 kg  03/09/18 81.6 kg  05/16/17 82.6 kg  09/14/15 87.1 kg  08/27/14 76.9 kg  08/27/14 76.7 kg  12/27/14 77.6 kg  10/31/13 79.4 kg  09/06/13 82.6 kg  09/01/13 84.8 kg  04/26/13 84.8 kg    Body mass index is 25.85 kg/m. Patient meets criteria for overweight based on current BMI.   Labs and medications reviewed.   No nutrition interventions warranted at this time. If nutrition issues arise, please consult RD.   Tilda Franco, MS, RD, LDN [facility] Inpatient Clinical Dietitian Pager: [phone] After Hours Pager: [phone]

## 2018-08-28 NOTE — Tx Team (Addendum)
Interdisciplinary Treatment and Diagnostic Plan Update  08/28/2018 Time of Session: 0905 Angel [name] MRN: [id number]  Principal Diagnosis: <principal problem not specified>  Secondary Diagnoses: Active Problems:   Severe recurrent major depression without psychotic features (HCC)   Current Medications:  Current Facility-Administered Medications  Medication Dose Route Frequency Provider Last Rate Last Dose  . acetaminophen (TYLENOL) tablet 650 mg  650 mg Oral Q6H PRN Lindon Romp A, NP      . alum & mag hydroxide-simeth (MAALOX/MYLANTA) 200-200-20 MG/5ML suspension 30 mL  30 mL Oral Q4H PRN Lindon Romp A, NP      . ARIPiprazole (ABILIFY) tablet 5 mg  5 mg Oral QHS Lindon Romp A, NP      . famotidine (PEPCID) tablet 20 mg  20 mg Oral BID Lindon Romp A, NP   20 mg at 08/28/18 0834  . FLUoxetine (PROZAC) capsule 20 mg  20 mg Oral Daily Lindon Romp A, NP   20 mg at 08/28/18 6144  . hydrOXYzine (ATARAX/VISTARIL) tablet 25 mg  25 mg Oral TID PRN Rozetta Nunnery, NP      . Derrill Memo ON 08/29/2018] Influenza vac split quadrivalent PF (FLUARIX) injection 0.5 mL  0.5 mL Intramuscular Tomorrow-1000 Cobos, Fernando A, MD      . magnesium hydroxide (MILK OF MAGNESIA) suspension 30 mL  30 mL Oral Daily PRN Lindon Romp A, NP      . nicotine (NICODERM CQ - dosed in mg/24 hours) patch 21 mg  21 mg Transdermal Daily Lindon Romp A, NP   21 mg at 08/28/18 0835  . traZODone (DESYREL) tablet 100 mg  100 mg Oral QHS PRN Rozetta Nunnery, NP       PTA Medications: Medications Prior to Admission  Medication Sig Dispense Refill Last Dose  . citalopram (CELEXA) 20 MG tablet Take 20 mg by mouth daily.   Past Month at Unknown time  . nicotine (NICODERM CQ - DOSED IN MG/24 HOURS) 21 mg/24hr patch Place 1 patch (21 mg total) onto the skin daily. (May buy from over the counter): For smoking cessation (Patient not taking: Reported on 08/27/2018) 28 patch 0 Not Taking at Unknown time    Patient Stressors:     Patient Strengths:    Treatment Modalities: Medication Management, Group therapy, Case management,  1 to 1 session with clinician, Psychoeducation, Recreational therapy.   Physician Treatment Plan for Primary Diagnosis: <principal problem not specified> Long Term Goal(s):     Short Term Goals:    Medication Management: Evaluate patient's response, side effects, and tolerance of medication regimen.  Therapeutic Interventions: 1 to 1 sessions, Unit Group sessions and Medication administration.  Evaluation of Outcomes: Not Met  Physician Treatment Plan for Secondary Diagnosis: Active Problems:   Severe recurrent major depression without psychotic features ([place])  Long Term Goal(s):     Short Term Goals:       Medication Management: Evaluate patient's response, side effects, and tolerance of medication regimen.  Therapeutic Interventions: 1 to 1 sessions, Unit Group sessions and Medication administration.  Evaluation of Outcomes: Not Met   RN Treatment Plan for Primary Diagnosis: <principal problem not specified> Long Term Goal(s): Knowledge of disease and therapeutic regimen to maintain health will improve  Short Term Goals: Ability to identify and develop effective coping behaviors will improve and Compliance with prescribed medications will improve  Medication Management: RN will administer medications as ordered by provider, will assess and evaluate patient's response and provide education to patient for prescribed medication.  RN will report any adverse and/or side effects to prescribing provider.  Therapeutic Interventions: 1 on 1 counseling sessions, Psychoeducation, Medication administration, Evaluate responses to treatment, Monitor vital signs and CBGs as ordered, Perform/monitor CIWA, COWS, AIMS and Fall Risk screenings as ordered, Perform wound care treatments as ordered.  Evaluation of Outcomes: Not Met   LCSW Treatment Plan for Primary Diagnosis: <principal  problem not specified> Long Term Goal(s): Safe transition to appropriate next level of care at discharge, Engage patient in therapeutic group addressing interpersonal concerns.  Short Term Goals: Engage patient in aftercare planning with referrals and resources  Therapeutic Interventions: Assess for all discharge needs, 1 to 1 time with Social worker, Explore available resources and support systems, Assess for adequacy in community support network, Educate family and significant other(s) on suicide prevention, Complete Psychosocial Assessment, Interpersonal group therapy.  Evaluation of Outcomes: Not Met   Progress in Treatment: Attending groups: No. Participating in groups: No. Taking medication as prescribed: Yes. Toleration medication: Yes. Family/Significant other contact made: No, will contact:  brother Patient understands diagnosis: Yes. Discussing patient identified problems/goals with staff: Yes. Medical problems stabilized or resolved: Yes. Denies suicidal/homicidal ideation: Yes. Issues/concerns per patient self-inventory: No. Other: none  New problem(s) identified: No, Describe:  none  New Short Term/Long Term Goal(s):  Patient Goals:    Discharge Plan or Barriers:   Reason for Continuation of Hospitalization: Depression Medication stabilization  Estimated Length of Stay: 3-5 days.  Attendees: Patient: 08/28/2018   Physician: Dr. Parke Poisson, MD 08/28/2018   Nursing: Mayra Neer, RN 08/28/2018   RN Care Manager: 08/28/2018   Social Worker: Lurline Idol, LCSW 08/28/2018   Recreational Therapist:  08/28/2018   Other:  08/28/2018   Other:  08/28/2018   Other: 08/28/2018          Scribe for Treatment Team: Joanne Chars, [place] 08/28/2018 11:41 AM

## 2018-08-28 NOTE — H&P (Signed)
Psychiatric Admission Assessment Adult  Patient Identification: Angel [name] MRN:  [id number] Date of Evaluation:  08/28/2018 Chief Complaint:  " I cut myself, was feeling suicidal " Principal Diagnosis: MDD Diagnosis:   Patient Active Problem List   Diagnosis Date Noted  . GERD (gastroesophageal reflux disease) [K21.9] 03/11/2018  . Severe recurrent major depression without psychotic features (HCC) [F33.2] 03/09/2018  . Major depressive disorder, recurrent episode (HCC) [F33.9] 05/16/2017  . Tobacco use disorder [F17.200] 07/17/2015  . Headache syndrome [G44.89] 07/17/2015  . Nicotine dependence [F17.200] 07/17/2015  . Generalized anxiety disorder [F41.1] 11/01/2013  . Major depressive disorder, recurrent episode, severe, without mention of psychotic behavior [F33.2] 10/31/2013   History of Present Illness: 37 year old male, presented to ED via [facility] on 10/1 due to suicidal ideations, and episode of self cutting on both forearms- multiple self inflicted superficial scratches and lacerations visible on both forearms ( did not require sutures) . Reports recent suicidal ideations, with thoughts of overdosing or hanging self . States he contacted [facility] due to worsening symptoms. Patient reports history of depression and states he has felt more depressed over the last 2 months , which he attributes partly to the death by suicide of his brother earlier this year in July. Endorses neuro-vegetative symptoms of depression as below.  Denies psychotic symptoms. States he has been compliant with psychiatric medications - currently reports being on Celexa , Trazodone without side effects.  Associated Signs/Symptoms: Depression Symptoms:  depressed mood, anhedonia, insomnia, suicidal thoughts with specific plan, anxiety, loss of energy/fatigue, decreased appetite, (Hypo) Manic Symptoms: none noted or endorsed  Anxiety Symptoms:  Reports increased anxiety recently  Psychotic Symptoms:  Denies   PTSD Symptoms: Denies  Total Time spent with patient: 45 minutes  Past Psychiatric History: patient reports history of depression, anxiety. History of prior psychiatric admissions for depression, suicidal ideations, most recently in 4/19. At the time was diagnosed with MDD. At the time was discharged on Abilify, Prozac, Trazodone. Reports prior history of self cutting, denies past suicide attempts, denies history of psychosis, denies history of mania, denies history of panic but does endorse some degree of agoraphobia.  Denies history of violence towards others .  Is the patient at risk to self? Yes.    Has the patient been a risk to self in the past 6 months? Yes.    Has the patient been a risk to self within the distant past? Yes.    Is the patient a risk to others? No.  Has the patient been a risk to others in the past 6 months? No.  Has the patient been a risk to others within the distant past? No.   Prior Inpatient Therapy:  as above Prior Outpatient Therapy:  follows up at [facility]  Alcohol Screening: 1. How often do you have a drink containing alcohol?: Never 2. How many drinks containing alcohol do you have on a typical day when you are drinking?: 1 or 2 3. How often do you have six or more drinks on one occasion?: Never AUDIT-C Score: 0 4. How often during the last year have you found that you were not able to stop drinking once you had started?: Never 5. How often during the last year have you failed to do what was normally expected from you becasue of drinking?: Never 6. How often during the last year have you needed a first drink in the morning to get yourself going after a heavy drinking session?: Never 7. How often during  the last year have you had a feeling of guilt of remorse after drinking?: Never 8. How often during the last year have you been unable to remember what happened the night before because you had been drinking?: Never 9. Have you or someone else been injured  as a result of your drinking?: No 10. Has a relative or friend or a doctor or another health worker been concerned about your drinking or suggested you cut down?: No Alcohol Use Disorder Identification Test Final Score (AUDIT): 0 Substance Abuse History in the last 12 months:  Denies alcohol or drug abuse  Consequences of Substance Abuse: Denies  Previous Psychotropic Medications:  Reports he has been on Celexa 20 mgrs daily  and on Trazodone . Has been on these medications x several months, states he does not feel they are currently helping . In the past has been on Abilify/Prozac, remembers these medications as helpful and well tolerated Psychological Evaluations: No Past Medical History: denies medical illnesses  Past Medical History:  Diagnosis Date  . Anxiety   . Depression   . Migraine    History reviewed. No pertinent surgical history. Family History: mother deceased - died 3 years ago from brain malignancy, father alive, in nursing home, has two surviving siblings and a brother who committed suicide earlier this year . Family History  Problem Relation Age of Onset  . Cancer Mother        brain  . Diabetes Brother   . Hypertension Brother    Family Psychiatric  History: reports brother had history of depression and committed suicide, also a maternal aunt had depression. No known alcohol or drug abuse in family  Tobacco Screening:  smokes 1 ppd  Social History: 8, single, no children, lives with friend, unemployed, denies legal issues  Social History   Substance and Sexual Activity  Alcohol Use No     Social History   Substance and Sexual Activity  Drug Use No    Additional Social History:  Allergies:  No Known Allergies Lab Results:  Results for orders placed or performed during the hospital encounter of 08/28/18 (from the past 48 hour(s))  Lipid panel     Status: Abnormal   Collection Time: 08/28/18  6:20 AM  Result Value Ref Range   Cholesterol 171 0 - 200 mg/dL    Triglycerides 161 <096 mg/dL   HDL 46 >04 mg/dL   Total CHOL/HDL Ratio 3.7 RATIO   VLDL 21 0 - 40 mg/dL   LDL Cholesterol 540 (H) 0 - 99 mg/dL    Comment:        Total Cholesterol/HDL:CHD Risk Coronary Heart Disease Risk Table                     Men   Women  1/2 Average Risk   3.4   3.3  Average Risk       5.0   4.4  2 X Average Risk   9.6   7.1  3 X Average Risk  23.4   11.0        Use the calculated Patient Ratio above and the CHD Risk Table to determine the patient's CHD Risk.        ATP III CLASSIFICATION (LDL):  <100     mg/dL   Optimal  981-191  mg/dL   Near or Above                    Optimal  130-159  mg/dL   Borderline  578-469  mg/dL   High  >629     mg/dL   Very High Performed at [facility], [address], [place], [place] [address]   TSH     Status: None   Collection Time: 08/28/18  6:20 AM  Result Value Ref Range   TSH 1.582 0.350 - 4.500 uIU/mL    Comment: Performed by a 3rd Generation assay with a functional sensitivity of <=0.01 uIU/mL. Performed at [facility], [address] [place], [place] [address]     Blood Alcohol level:  Lab Results  Component Value Date   ETH <10 08/27/2018   ETH <10 03/09/2018    Metabolic Disorder Labs:  Lab Results  Component Value Date   HGBA1C 5.3 05/17/2017   MPG 105 05/17/2017   Lab Results  Component Value Date   PROLACTIN 17.3 (H) 05/17/2017   Lab Results  Component Value Date   CHOL 171 08/28/2018   TRIG 107 08/28/2018   HDL 46 08/28/2018   CHOLHDL 3.7 08/28/2018   VLDL 21 08/28/2018   LDLCALC 104 (H) 08/28/2018   LDLCALC 128 (H) 05/17/2017    Current Medications: Current Facility-Administered Medications  Medication Dose Route Frequency Provider Last Rate Last Dose  . acetaminophen (TYLENOL) tablet 650 mg  650 mg Oral Q6H PRN Nira Conn A, NP      . alum & mag hydroxide-simeth (MAALOX/MYLANTA) 200-200-20 MG/5ML suspension 30 mL  30 mL Oral Q4H PRN  Nira Conn A, NP      . ARIPiprazole (ABILIFY) tablet 5 mg  5 mg Oral QHS Nira Conn A, NP      . famotidine (PEPCID) tablet 20 mg  20 mg Oral BID Nira Conn A, NP   20 mg at 08/28/18 0834  . FLUoxetine (PROZAC) capsule 20 mg  20 mg Oral Daily Nira Conn A, NP   20 mg at 08/28/18 1027  . hydrOXYzine (ATARAX/VISTARIL) tablet 25 mg  25 mg Oral TID PRN Jackelyn Poling, NP      . Melene Muller ON 08/29/2018] Influenza vac split quadrivalent PF (FLUARIX) injection 0.5 mL  0.5 mL Intramuscular Tomorrow-1000 Cobos, Fernando A, MD      . magnesium hydroxide (MILK OF MAGNESIA) suspension 30 mL  30 mL Oral Daily PRN Nira Conn A, NP      . nicotine (NICODERM CQ - dosed in mg/24 hours) patch 21 mg  21 mg Transdermal Daily Nira Conn A, NP   21 mg at 08/28/18 0835  . traZODone (DESYREL) tablet 100 mg  100 mg Oral QHS PRN Jackelyn Poling, NP       PTA Medications: Medications Prior to Admission  Medication Sig Dispense Refill Last Dose  . citalopram (CELEXA) 20 MG tablet Take 20 mg by mouth daily.   Past Month at Unknown time  . nicotine (NICODERM CQ - DOSED IN MG/24 HOURS) 21 mg/24hr patch Place 1 patch (21 mg total) onto the skin daily. (May buy from over the counter): For smoking cessation (Patient not taking: Reported on 08/27/2018) 28 patch 0 Not Taking at Unknown time    Musculoskeletal: Strength & Muscle Tone: within normal limits Gait & Station: normal Patient leans: N/A  Psychiatric Specialty Exam: Physical Exam  Review of Systems  Constitutional: Negative.   HENT: Negative.   Eyes: Negative.   Respiratory: Negative.   Cardiovascular: Negative.   Gastrointestinal: Negative.  Negative for nausea and vomiting.  Genitourinary: Negative.   Musculoskeletal: Negative.  Skin: Negative.   Neurological: Negative.  Negative for seizures.  Psychiatric/Behavioral: Positive for depression and suicidal ideas. The patient is nervous/anxious.     Blood pressure 93/76, pulse 100, temperature 98.5  F (36.9 C), resp. rate 20, height 5\' 8"  (1.727 m), weight 77.1 kg, SpO2 97 %.Body mass index is 25.85 kg/m.  General Appearance: Fairly Groomed  Eye Contact:  Fair  Speech:  Normal Rate  Volume:  Decreased  Mood:  Depressed  Affect:  constricted  Thought Process:  Linear and Descriptions of Associations: Intact  Orientation:  Full (Time, Place, and Person)  Thought Content:  denies hallucinations, not internally preoccupied  Suicidal Thoughts:  No denies current suicidal or self injurious ideations and contracts for safety on unit, denies homicidal ideations and contracts for safety on unit   Homicidal Thoughts:  No  Memory:  recent and remote grossly intact   Judgement:  Fair  Insight:  Fair  Psychomotor Activity:  Decreased  Concentration:  Concentration: Fair and Attention Span: Fair  Recall:  Good  Fund of Knowledge:  Good  Language:  Good  Akathisia:  Negative  Handed:  Left  AIMS (if indicated):     Assets:  Desire for Improvement Resilience  ADL's:  fair  Cognition:  WNL  Sleep:  Number of Hours: 4    Treatment Plan Summary: Daily contact with patient to assess and evaluate symptoms and progress in treatment, Medication management, Plan inpatient treatment  and medications as below  Observation Level/Precautions:  15 minute checks  Laboratory:  as needed  Psychotherapy:  Milieu, group therapy  Medications:  Has been restarted on Abilify 5 mgs QDAY, Prozac 20 mgr QDAY . Continue Trazodone 100 mgrs QHS PRN for insomnia as needed    Consultations:  As needed   Discharge Concerns:  -  Estimated LOS: 5 days   Other:     Physician Treatment Plan for Primary Diagnosis:  MDD, no psychotic features  Long Term Goal(s): Improvement in symptoms so as ready for discharge  Short Term Goals: Ability to identify changes in lifestyle to reduce recurrence of condition will improve and Ability to identify and develop effective coping behaviors will improve  Physician Treatment  Plan for Secondary Diagnosis: Suicidal Ideations Long Term Goal(s): Improvement in symptoms so as ready for discharge  Short Term Goals: Ability to identify changes in lifestyle to reduce recurrence of condition will improve, Ability to verbalize feelings will improve, Ability to disclose and discuss suicidal ideas, Ability to demonstrate self-control will improve, Ability to identify and develop effective coping behaviors will improve and Ability to maintain clinical measurements within normal limits will improve  I certify that inpatient services furnished can reasonably be expected to improve the patient's condition.    Craige Cotta, MD 10/2/20193:08 PM

## 2018-08-28 NOTE — ED Notes (Signed)
Pt d/c to [facility] transported via [facility]

## 2018-08-28 NOTE — Progress Notes (Signed)
Recreation Therapy Notes  Date: 10.2.19 Time: 0930 Location: [address] Dayroom  Group Topic: Stress Management  Goal Area(s) Addresses:  Patient will verbalize importance of using healthy stress management.  Patient will identify positive emotions associated with healthy stress management.   Intervention: Stress Management  Activity :  Guided Imagery.  LRT introduced the stress management technique of guided imagery.  LRT read a script taking the patients on a journey [place] the beach at sunrise.  Patients were to listen and follow along as script was read.  Education:  Stress Management, Discharge Planning.   Education Outcome: Acknowledges edcuation/In group clarification offered/Needs additional education  Clinical Observations/Feedback: Pt did not attend group.    Caroll Rancher, LRT/CTRS         Caroll Rancher A 08/28/2018 11:35 AM

## 2018-08-28 NOTE — [facility] Group Notes (Signed)
[facility] Group Therapy 08/28/2018 1:15pm  Type of Therapy: [facility] [employer] Presentation  Participation Level: Did not attend  Participation Quality:   Affect:   Cognitive:  Insight:   Engagement in Therapy:   Modes of Intervention: Discussion, Education and Socialization  Summary of Progress/Problems: [employer] ([facility]) Speaker came to talk about his personal journey with mental health. The pt processed ways by which to relate to the speaker. MHA speaker provided handouts and educational information pertaining to groups and services offered by the MHA. Pt was engaged in speaker's presentation and was receptive to resources provided.    Kyleen Villatoro Jon, LCSW 08/28/2018 3:08 PM 

## 2018-08-29 DIAGNOSIS — G4700 Insomnia, unspecified: Secondary | ICD-10-CM

## 2018-08-29 LAB — HEMOGLOBIN A1C
Hgb A1c MFr Bld: 5.3 % (ref 4.8–5.6)
Mean Plasma Glucose: 105 mg/dL

## 2018-08-29 LAB — PROLACTIN: PROLACTIN: 6.7 ng/mL (ref 4.0–15.2)

## 2018-08-29 MED ORDER — MIRTAZAPINE 7.5 MG PO TABS
7.5000 mg | ORAL_TABLET | Freq: Every day | ORAL | Status: DC
  Administered 2018-08-29 – 2018-09-01 (×4): 7.5 mg via ORAL
  Filled 2018-08-29: qty 7
  Filled 2018-08-29 (×5): qty 1

## 2018-08-29 NOTE — [facility] Group Notes (Signed)
[facility] LCSW Group Therapy Note  Date/Time: 08/29/18, 1315  Type of Therapy/Topic:  Group Therapy:  Balance in Life  Participation Level:  minimal  Description of Group:    This group will address the concept of balance and how it feels and looks when one is unbalanced. Patients will be encouraged to process areas in their lives that are out of balance, and identify reasons for remaining unbalanced. Facilitators will guide patients utilizing problem- solving interventions to address and correct the stressor making their life unbalanced. Understanding and applying boundaries will be explored and addressed for obtaining  and maintaining a balanced life. Patients will be encouraged to explore ways to assertively make their unbalanced needs known to significant others in their lives, using other group members and facilitator for support and feedback.  Therapeutic Goals: 1. Patient will identify two or more emotions or situations they have that consume much of in their lives. 2. Patient will identify signs/triggers that life has become out of balance:  3. Patient will identify two ways to set boundaries in order to achieve balance in their lives:  4. Patient will demonstrate ability to communicate their needs through discussion and/or role plays  Summary of Patient Progress:Pt shared that mental/emotional, and physical are areas that are currently out of balance.  Pt mostly quiet during group discussion but did respond to CSW questions. Pt had trouble verbalizing responses.           Therapeutic Modalities:   Cognitive Behavioral Therapy Solution-Focused Therapy Assertiveness Training  [place]aleen [place], [place]

## 2018-08-29 NOTE — [facility] Suicide Risk Assessment (Addendum)
[facility] INPATIENT:  Family/Significant Other Suicide Prevention Education  Suicide Prevention Education:  Contact Attempts: Oluwadarasimi Redmon, brother, [phone], has been identified by the patient as the family member/significant other with whom the patient will be residing, and identified as the person(s) who will aid the patient in the event of a mental health crisis.  With written consent from the patient, two attempts were made to provide suicide prevention education, prior to and/or following the patient's discharge.  We were unsuccessful in providing suicide prevention education.  A suicide education pamphlet was given to the patient to share with family/significant other.  Date and time of first attempt:08/29/18, 1124 Date and time of second attempt:08/30/18, 1133  Lorri Frederick, LCSW 08/29/2018, 11:24 AM

## 2018-08-29 NOTE — Progress Notes (Signed)
[facility] MD Progress Note  08/29/2018 1:33 PM Angel [name]  MRN:  [id number] Subjective: Patient describes slight improvement in mood, although remains depressed/sad and continues to endorse neurovegetative symptoms such as anhedonia and decreased energy.  Denies suicidal ideations.  Reports nightmares/vivid dreams on Trazodone Objective : I have discussed case with treatment team and have met with patient. 37 year old male, presented for worsening depression, neuro-vegetative symptoms of depression, suicidal ideations, episode of self cutting on forearms. Reports history of chronic depression, which has worsened after death of his brother earlier this year, who committed suicide. Patient presents with slightly improved affect.  Although remains sad/depressed does smile briefly at times and today presents with improving grooming, improving eye contact, and seems more verbal/communicative. Limited group participation, tends to isolate, but more visible on unit today.     Principal Problem: Depression  Diagnosis:   Patient Active Problem List   Diagnosis Date Noted  . GERD (gastroesophageal reflux disease) [K21.9] 03/11/2018  . Severe recurrent major depression without psychotic features ([employer]) [F33.2] 03/09/2018  . Major depressive disorder, recurrent episode ([place]) [F33.9] 05/16/2017  . Tobacco use disorder [F17.200] 07/17/2015  . Headache syndrome [G44.89] 07/17/2015  . Nicotine dependence [F17.200] 07/17/2015  . Generalized anxiety disorder [F41.1] 11/01/2013  . Major depressive disorder, recurrent episode, severe, without mention of psychotic behavior [F33.2] 10/31/2013   Total Time spent with patient: 20 minutes  Past Psychiatric History:   Past Medical History:  Past Medical History:  Diagnosis Date  . Anxiety   . Depression   . Migraine    History reviewed. No pertinent surgical history. Family History:  Family History  Problem Relation Age of Onset  . Cancer Mother    brain  . Diabetes Brother   . Hypertension Brother    Family Psychiatric  History:  Social History:  Social History   Substance and Sexual Activity  Alcohol Use No     Social History   Substance and Sexual Activity  Drug Use No    Social History   Socioeconomic History  . Marital status: Single    Spouse name: Not on file  . Number of children: Not on file  . Years of education: Not on file  . Highest education level: Not on file  Occupational History  . Not on file  Social Needs  . Financial resource strain: Not on file  . Food insecurity:    Worry: Not on file    Inability: Not on file  . Transportation needs:    Medical: Not on file    Non-medical: Not on file  Tobacco Use  . Smoking status: Current Every Day Smoker    Packs/day: 1.50    Years: 20.00    Pack years: 30.00    Types: Cigarettes  . Smokeless tobacco: Never Used  Substance and Sexual Activity  . Alcohol use: No  . Drug use: No  . Sexual activity: Not on file  Lifestyle  . Physical activity:    Days per week: Not on file    Minutes per session: Not on file  . Stress: Not on file  Relationships  . Social connections:    Talks on phone: Not on file    Gets together: Not on file    Attends religious service: Not on file    Active member of club or organization: Not on file    Attends meetings of clubs or organizations: Not on file    Relationship status: Not on file  Other  Topics Concern  . Not on file  Social History Narrative  . Not on file   Additional Social History:   Sleep: Improving  Appetite:  Fair  Current Medications: Current Facility-Administered Medications  Medication Dose Route Frequency Provider Last Rate Last Dose  . acetaminophen (TYLENOL) tablet 650 mg  650 mg Oral Q6H PRN Lindon Romp A, NP      . alum & mag hydroxide-simeth (MAALOX/MYLANTA) 200-200-20 MG/5ML suspension 30 mL  30 mL Oral Q4H PRN Lindon Romp A, NP      . ARIPiprazole (ABILIFY) tablet 5 mg  5 mg  Oral QHS Lindon Romp A, NP   5 mg at 08/28/18 2227  . famotidine (PEPCID) tablet 20 mg  20 mg Oral BID Lindon Romp A, NP   20 mg at 08/29/18 0805  . FLUoxetine (PROZAC) capsule 20 mg  20 mg Oral Daily Lindon Romp A, NP   20 mg at 08/29/18 0805  . hydrOXYzine (ATARAX/VISTARIL) tablet 25 mg  25 mg Oral TID PRN Rozetta Nunnery, NP   25 mg at 08/28/18 2227  . magnesium hydroxide (MILK OF MAGNESIA) suspension 30 mL  30 mL Oral Daily PRN Lindon Romp A, NP      . mirtazapine (REMERON) tablet 7.5 mg  7.5 mg Oral QHS Duffy Dantonio A, MD      . nicotine (NICODERM CQ - dosed in mg/24 hours) patch 21 mg  21 mg Transdermal Daily Lindon Romp A, NP   21 mg at 08/29/18 0737    Lab Results:  Results for orders placed or performed during the hospital encounter of 08/28/18 (from the past 48 hour(s))  Hemoglobin A1c     Status: None   Collection Time: 08/28/18  6:20 AM  Result Value Ref Range   Hgb A1c MFr Bld 5.3 4.8 - 5.6 %    Comment: (NOTE)         Prediabetes: 5.7 - 6.4         Diabetes: >6.4         Glycemic control for adults with diabetes: <7.0    Mean Plasma Glucose 105 mg/dL    Comment: (NOTE) Performed At: [facility] [place], [place] [id number] Rush Farmer MD [id number]   Lipid panel     Status: Abnormal   Collection Time: 08/28/18  6:20 AM  Result Value Ref Range   Cholesterol 171 0 - 200 mg/dL   Triglycerides 107 <150 mg/dL   HDL 46 >40 mg/dL   Total CHOL/HDL Ratio 3.7 RATIO   VLDL 21 0 - 40 mg/dL   LDL Cholesterol 104 (H) 0 - 99 mg/dL    Comment:        Total Cholesterol/HDL:CHD Risk Coronary Heart Disease Risk Table                     Men   Women  1/2 Average Risk   3.4   3.3  Average Risk       5.0   4.4  2 X Average Risk   9.6   7.1  3 X Average Risk  23.4   11.0        Use the calculated Patient Ratio above and the CHD Risk Table to determine the patient's CHD Risk.        ATP III CLASSIFICATION (LDL):  <100     mg/dL   Optimal   100-129  mg/dL   Near or Above  Optimal  130-159  mg/dL   Borderline  160-189  mg/dL   High  >190     mg/dL   Very High Performed at [facility] [address] [place], [place] [address]   Prolactin     Status: None   Collection Time: 08/28/18  6:20 AM  Result Value Ref Range   Prolactin 6.7 4.0 - 15.2 ng/mL    Comment: (NOTE) Performed At: [facility] [place], [place] [id number] Rush Farmer MD [id number]   TSH     Status: None   Collection Time: 08/28/18  6:20 AM  Result Value Ref Range   TSH 1.582 0.350 - 4.500 uIU/mL    Comment: Performed by a 3rd Generation assay with a functional sensitivity of <=0.01 uIU/mL. Performed at [facility], [place] [address] [place], [place] [address]     Blood Alcohol level:  Lab Results  Component Value Date   ETH <10 08/27/2018   ETH <10 68/01/2121    Metabolic Disorder Labs: Lab Results  Component Value Date   HGBA1C 5.3 08/28/2018   MPG 105 08/28/2018   MPG 105 05/17/2017   Lab Results  Component Value Date   PROLACTIN 6.7 08/28/2018   PROLACTIN 17.3 (H) 05/17/2017   Lab Results  Component Value Date   CHOL 171 08/28/2018   TRIG 107 08/28/2018   HDL 46 08/28/2018   CHOLHDL 3.7 08/28/2018   VLDL 21 08/28/2018   LDLCALC 104 (H) 08/28/2018   LDLCALC 128 (H) 05/17/2017    Physical Findings: AIMS:  , ,  ,  ,    CIWA:    COWS:     Musculoskeletal: Strength & Muscle Tone: within normal limits Gait & Station: normal Patient leans: N/A  Psychiatric Specialty Exam: Physical Exam  ROS no chest pain, no shortness of breath, no vomiting  Blood pressure 93/75, pulse (!) 110, temperature 98.7 F (37.1 C), temperature source Oral, resp. rate 20, height _0  (1.727 m), weight 77.1 kg, SpO2 97 %.Body mass index is 25.85 kg/m.  General Appearance: Well Groomed  Eye Contact:  Good  Speech:  Normal Rate  Volume:  Normal  Mood:  Remains  depressed, endorses some improvement compared to how he felt prior to admission  Affect:  Constricted but becoming somewhat more reactive, smiles briefly at times  Thought Process:  Linear and Descriptions of Associations: Intact  Orientation:  Other:  Fully alert and attentive  Thought Content:  No hallucinations, no delusions  Suicidal Thoughts:  No-currently denies suicidal ideations, denies homicidal or self-injurious ideations, contracts for safety on unit  Homicidal Thoughts:  No  Memory:  Recent and remote grossly intact  Judgement:  Fair/improving  Insight:  Fair  Psychomotor Activity:  decreased but more visible on unit today  Concentration:  Concentration: Good and Attention Span: Good  Recall:  Good  Fund of Knowledge:  Good  Language:  Good  Akathisia:  Negative  Handed:  Left  AIMS (if indicated):     Assets:  Communication Skills Desire for Improvement Resilience  ADL's:  Intact  Cognition:  WNL  Sleep:  Number of Hours: 6.75   Assessment-37 year old male, presented for worsening depression, neuro-vegetative symptoms of depression, suicidal ideations, episode of self cutting on forearms. Reports history of chronic depression, which has worsened after death of his brother earlier this year, who committed suicide. Patient remains depressed but presents with slightly improved mood and range of affect.  Currently denies  suicidal ideations.  Thus far tolerating Prozac, Abilify well.  He does report that Trazodone is associated with significant nightmares/vivid dreams and prefers to "try something else".  We discussed options-start Remeron 7.5 mg nightly  Treatment Plan Summary: Daily contact with patient to assess and evaluate symptoms and progress in treatment, Medication management, Plan Inpatient treatment and Medications as below Encourage group and milieu participation Treatment team working on disposition planning Discontinue Trazodone - see above Start Remeron 7.5 mg  nightly for depression and insomnia Continue Prozac 20 mg daily for depression Continue Abilify 5 mg daily for augmentation/mood disorder  Jenne Campus, MD 08/29/2018, 1:33 PM

## 2018-08-29 NOTE — [facility] Group Notes (Signed)
[facility] Group Notes:  (Nursing/MHT/Case Management/Adjunct)  Date:  08/29/2018  Time:  5:44 PM  Type of Therapy:  Psychoeducational Skills  Participation Level:  Active  Participation Quality:  Appropriate and Attentive  Affect:  Appropriate  Cognitive:  Alert and Appropriate  Insight:  Appropriate and Good  Engagement in Group:  Engaged  Modes of Intervention:  Discussion  Summary of Progress/Problems:  Discussed Journey to recovery.  Patient was attentive and receptive.  Audrie Lia Katharin Schneider 08/29/2018, 5:44 PM

## 2018-08-29 NOTE — [facility] Counselor (Signed)
Adult Comprehensive Assessment  Patient ID: Angel [name], male   DOB: Jun 06, 1981, 37 y.o.   MRN: [id number]  Information Source: Information source: Patient  Current Stressors:  Patient states their primary concerns and needs for treatment are:: Stop cutting, get better.  Patient states their goals for this hospitilization and ongoing recovery are:: "get better.  I'm tired of being sad." Financial / Lack of resources (include bankruptcy): Pt reports chronic financial stress. "tired ot struggling every day.  I fight to just live" Housing / Lack of housing: Pt reports he has not good housing situation and stays with several friends who argue all the time.   Bereavement / Loss: Pt reports brother committed suicide 6 weeks ago.    Living/Environment/Situation:  Living Arrangements: Non-relatives/Friends Living conditions (as described by patient or guardian):stressful due to roommate and his girlfriend arguing all the time. How long has patient lived in current situation?:off and on for past 5-6 years. Pt moved in with someone else several months ago, which did not work out and he has returned. What is atmosphere in current home:stressful  Family History:  Marital status: Single Currently sexually active: No Sexual orientation: heterosexual Does patient have children?: No  Childhood History:  By whom was/is the patient raised?: Both parents Additional childhood history information: Patient reports having a poor and rough childhood: significant poverty and father was gone all the time, cheated on pt's mother regularly. Parents divorced when pt was 14. Description of patient's relationship with caregiver when they were a child: Good with mother but father was never at home Patient's description of current relationship with people who raised him/her: No relationship with father - mother is deceased Does patient have siblings?: Yes Number of Siblings: 3 Description of patient's  current relationship with siblings: Two brothers, one sister.  Younger brother just committed suicide.  OK relationships with other sibs, but not close. Did patient suffer any verbal/emotional/physical/sexual abuse as a child?: No Did patient suffer from severe childhood neglect?: No Has patient ever been sexually abused/assaulted/raped as an adolescent or adult?: No Was the patient ever a victim of a crime or a disaster?: No Witnessed domestic violence?: No Has patient been effected by domestic violence as an adult?: No  Education:  Highest grade of school patient has completed: 9th Currently a student?: No Learning disability?: yes,  Unspecified  Employment/Work Situation:  Employment situation:Unemployed, works odd jobs from time to time. 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   Financial Resources:  Financial resources:None, some income from odd jobs Does patient have a Lawyer or guardian?: No  Alcohol/Substance Abuse:  What has been your use of drugs/alcohol within the last 12 months?: Pt denies use of alcohol or drugs.  UDS positive for marijuana. If attempted suicide, did drugs/alcohol play a role in this?: No Alcohol/Substance Abuse Treatment Hx: Denies past history Has alcohol/substance abuse ever caused legal problems?: No  Social Support System: Patient's Community Support System:None Describe Community Support System:Older brother in contact, but not supportive. Type of faith/religion: None How does patient's faith help to cope with current illness?: N/A  Leisure/Recreation:  Leisure and Hobbies: None   Strengths/Needs:   What is the patient's perception of their strengths?: "I don't  know" Patient  states they can use these personal strengths during their treatment to contribute to their recovery: "I have no idea how to improve my situation." Patient states these barriers may affect/interfere with their treatment: no Patient states these barriers may affect their return to the community: Pt has no transportation but usually can get someone to bring him to appts.  Discharge Plan:   Currently receiving community mental health services: Yes (From Whom)([facility] Patient states concerns and preferences for aftercare planning are: Willing to continue with [facility].   Patient states they will know when they are safe and ready for discharge when: "I don't know" Does patient have access to transportation?: No Does patient have financial barriers related to discharge medications?: Yes Patient description of barriers related to discharge medications: no insurance. Plan for no access to transportation at discharge: CSW assessing for appropriate plan. Will patient be returning to same living situation after discharge?: Yes  Summary/Recommendations:   Summary and Recommendations (to be completed by the evaluator): Pt is 37 year old male currently living in [place], [place]. [facility])  Pt diagnosed with major depressive disorder and admitted due to increased depression, suicidal ideation, and self cutting.  Recommendations for pt include crisis stabilization, therapeutic milieu, attend and participate in groups, medication management, and development of comprehensive mental wellness plan.  Lorri Frederick. 08/29/2018

## 2018-08-29 NOTE — Progress Notes (Signed)
Adult Psychoeducational Group Note  Date:  08/29/2018 Time:  2:00 AM  Group Topic/Focus:  Wrap-Up Group:   The focus of this group is to help patients review their daily goal of treatment and discuss progress on daily workbooks.  Participation Level:  Did Not Attend  Participation Quality:    Affect:    Cognitive:   Insight:   Engagement in Group:   Modes of Intervention:    Additional Comments:  Pt was invited to attend group but declined.    Harold Mattes 08/29/2018, 2:00 AM

## 2018-08-29 NOTE — Progress Notes (Signed)
Adult Psychoeducational Group Note  Date:  08/29/2018 Time:  9:24 PM  Group Topic/Focus:  Wrap Up Group  Participation Level:  Active  Participation Quality:  Appropriate  Affect:  Appropriate  Cognitive:  Appropriate  Insight: Appropriate  Engagement in Group:  Engaged  Modes of Intervention:  Discussion  Additional Comments: Pt stated he has no goals.  Pt stated he slept most of the day.  Pt rated the day at a 3/10.  Angel [name] 08/29/2018, 9:24 PM

## 2018-08-30 NOTE — Progress Notes (Signed)
[facility] MD Progress Note  08/30/2018 1:20 PM Angel [name]  MRN:  [id number] Subjective: patient reports persistent depression but acknowledges " I am feeling a little better"/ partially improved mood. Denies medication side effects. Denies suicidal ideations. Objective : I have discussed case with treatment team and have met with patient. 37 year old male, presented for worsening depression, neuro-vegetative symptoms of depression, suicidal ideations, episode of self cutting on forearms. Reports history of chronic depression, which has worsened after death of his brother earlier this year, who committed suicide. Patient remains depressed but is describing partially improving mood and presents less severely constricted in affect . He is also becoming less isolative, and has been spending more time in day room, going to some groups. Tolerating Remeron and Abilify well thus far . Reports slept better last night .     Principal Problem: Depression  Diagnosis:   Patient Active Problem List   Diagnosis Date Noted  . GERD (gastroesophageal reflux disease) [K21.9] 03/11/2018  . Severe recurrent major depression without psychotic features (HCC) [F33.2] 03/09/2018  . Major depressive disorder, recurrent episode (HCC) [F33.9] 05/16/2017  . Tobacco use disorder [F17.200] 07/17/2015  . Headache syndrome [G44.89] 07/17/2015  . Nicotine dependence [F17.200] 07/17/2015  . Generalized anxiety disorder [F41.1] 11/01/2013  . Major depressive disorder, recurrent episode, severe, without mention of psychotic behavior [F33.2] 10/31/2013   Total Time spent with patient: 20 minutes  Past Psychiatric History:   Past Medical History:  Past Medical History:  Diagnosis Date  . Anxiety   . Depression   . Migraine    History reviewed. No pertinent surgical history. Family History:  Family History  Problem Relation Age of Onset  . Cancer Mother        brain  . Diabetes Brother   . Hypertension Brother     Family Psychiatric  History:  Social History:  Social History   Substance and Sexual Activity  Alcohol Use No     Social History   Substance and Sexual Activity  Drug Use No    Social History   Socioeconomic History  . Marital status: Single    Spouse name: Not on file  . Number of children: Not on file  . Years of education: Not on file  . Highest education level: Not on file  Occupational History  . Not on file  Social Needs  . Financial resource strain: Not on file  . Food insecurity:    Worry: Not on file    Inability: Not on file  . Transportation needs:    Medical: Not on file    Non-medical: Not on file  Tobacco Use  . Smoking status: Current Every Day Smoker    Packs/day: 1.50    Years: 20.00    Pack years: 30.00    Types: Cigarettes  . Smokeless tobacco: Never Used  Substance and Sexual Activity  . Alcohol use: No  . Drug use: No  . Sexual activity: Not on file  Lifestyle  . Physical activity:    Days per week: Not on file    Minutes per session: Not on file  . Stress: Not on file  Relationships  . Social connections:    Talks on phone: Not on file    Gets together: Not on file    Attends religious service: Not on file    Active member of club or organization: Not on file    Attends meetings of clubs or organizations: Not on file      Relationship status: Not on file  Other Topics Concern  . Not on file  Social History Narrative  . Not on file   Additional Social History:   Sleep: Improving  Appetite:  improving  Current Medications: Current Facility-Administered Medications  Medication Dose Route Frequency Provider Last Rate Last Dose  . acetaminophen (TYLENOL) tablet 650 mg  650 mg Oral Q6H PRN Berry, Jason A, NP      . alum & mag hydroxide-simeth (MAALOX/MYLANTA) 200-200-20 MG/5ML suspension 30 mL  30 mL Oral Q4H PRN Berry, Jason A, NP      . ARIPiprazole (ABILIFY) tablet 5 mg  5 mg Oral QHS Berry, Jason A, NP   5 mg at 08/29/18 2113   . famotidine (PEPCID) tablet 20 mg  20 mg Oral BID Berry, Jason A, NP   20 mg at 08/30/18 0757  . FLUoxetine (PROZAC) capsule 20 mg  20 mg Oral Daily Berry, Jason A, NP   20 mg at 08/30/18 0757  . hydrOXYzine (ATARAX/VISTARIL) tablet 25 mg  25 mg Oral TID PRN Berry, Jason A, NP   25 mg at 08/28/18 2227  . magnesium hydroxide (MILK OF MAGNESIA) suspension 30 mL  30 mL Oral Daily PRN Berry, Jason A, NP      . mirtazapine (REMERON) tablet 7.5 mg  7.5 mg Oral QHS Cobos, Fernando A, MD   7.5 mg at 08/29/18 2113  . nicotine (NICODERM CQ - dosed in mg/24 hours) patch 21 mg  21 mg Transdermal Daily Berry, Jason A, NP   21 mg at 08/30/18 0757    Lab Results:  No results found for this or any previous visit (from the past 48 hour(s)).  Blood Alcohol level:  Lab Results  Component Value Date   ETH <10 08/27/2018   ETH <10 03/09/2018    Metabolic Disorder Labs: Lab Results  Component Value Date   HGBA1C 5.3 08/28/2018   MPG 105 08/28/2018   MPG 105 05/17/2017   Lab Results  Component Value Date   PROLACTIN 6.7 08/28/2018   PROLACTIN 17.3 (H) 05/17/2017   Lab Results  Component Value Date   CHOL 171 08/28/2018   TRIG 107 08/28/2018   HDL 46 08/28/2018   CHOLHDL 3.7 08/28/2018   VLDL 21 08/28/2018   LDLCALC 104 (H) 08/28/2018   LDLCALC 128 (H) 05/17/2017    Physical Findings: AIMS:  , ,  ,  ,    CIWA:    COWS:     Musculoskeletal: Strength & Muscle Tone: within normal limits Gait & Station: normal Patient leans: N/A  Psychiatric Specialty Exam: Physical Exam  ROS no headache, no chest pain, no shortness of breath, no vomiting  Blood pressure 104/79, pulse 93, temperature 98.6 F (37 C), temperature source Oral, resp. rate 20, height 5' 8" (1.727 m), weight 77.1 kg, SpO2 97 %.Body mass index is 25.85 kg/m.  General Appearance: grooming has improved   Eye Contact:  Good  Speech:  Normal Rate  Volume:  Normal  Mood:  partially improved mood, less depressed   Affect:   less severely constricted, smiles briefly at times   Thought Process:  Linear and Descriptions of Associations: Intact  Orientation:  Other:  Fully alert and attentive  Thought Content:  No hallucinations, no delusions  Suicidal Thoughts:  No-currently denies suicidal ideations, denies homicidal or self-injurious ideations, contracts for safety on unit  Homicidal Thoughts:  No  Memory:  Recent and remote grossly intact  Judgement: improving  Insight:  Fair    Psychomotor Activity:  improving   Concentration:  Concentration: Good and Attention Span: Good  Recall:  Good  Fund of Knowledge:  Good  Language:  Good  Akathisia:  Negative  Handed:  Left  AIMS (if indicated):     Assets:  Communication Skills Desire for Improvement Resilience  ADL's:  Intact  Cognition:  WNL  Sleep:  Number of Hours: 6.75   Assessment-37 year old male, presented for worsening depression, neuro-vegetative symptoms of depression, suicidal ideations, episode of self cutting on forearms. Reports history of chronic depression, which has worsened after death of his brother earlier this year, who committed suicide.  Patient is presenting with partially improving mood and a less constricted affect,also noted to be less isolative. Denies SI. Thus far tolerating Abilify/Remeron well .  Treatment Plan Summary: Daily contact with patient to assess and evaluate symptoms and progress in treatment, Medication management, Plan Inpatient treatment and Medications as below  Treatment Plan reviewed as below today 10/4  Encourage group and milieu participation Treatment team working on disposition planning Continue Remeron 7.5 mg nightly for depression and insomnia Continue Prozac 20 mg daily for depression Continue Abilify 5 mg daily for augmentation/mood disorder  Jenne Campus, MD 08/30/2018, 1:20 PM   Patient ID: Angel [name], male   DOB: 07-24-1981, 37 y.o.   MRN: [id number]

## 2018-08-30 NOTE — Plan of Care (Signed)
Patient self inventory- Patient slept well last night, sleep medication was requested and was helpful. Appetite has been good, energy level normal, and concentration poor. Depression, hopelessness, and anxiety rated 6, 4, 7. Denies SI HI AVH. Denies physical problems. Patient has no goal. Patient is compliant with medications prescribed per provider. Safety is maintained with 15 minutes as well as environmental checks. Will continue to monitor.  Problem: Education: Goal: Mental status will improve Outcome: Progressing   Problem: Education: Goal: Verbalization of understanding the information provided will improve Outcome: Progressing   Problem: Activity: Goal: Interest or engagement in activities will improve Outcome: Progressing

## 2018-08-30 NOTE — Progress Notes (Signed)
D: Pt denies SI/HI/AVH. Pt is pleasant and cooperative.  A: Pt was offered support and encouragement. Pt was given scheduled medications. Pt was encourage to attend groups. Q 15 minute checks were done for safety.  R:Pt attends groups and interacts well with peers and staff. Pt is taking medication. Pt has no complaints.Pt receptive to treatment and safety maintained on unit.  Problem: Education: Goal: Emotional status will improve Outcome: Progressing Note:  Pt stated he felt a little "better" this evening   Problem: Activity: Goal: Interest or engagement in activities will improve Outcome: Progressing Note:  Pt visible in the dayroom watching TV with peers   Problem: Activity: Goal: Sleeping patterns will improve Outcome: Progressing Note:  Pt slept 6.75 hrs last night   Problem: Safety: Goal: Periods of time without injury will increase Outcome: Progressing Note:  Pt has been safe on unit per 15 min check sheet and visual observations

## 2018-08-30 NOTE — Progress Notes (Signed)
Recreation Therapy Notes  Date: 10.4.19 Time: 0930 Location: 300 [address] Dayroom  Group Topic: Stress Management  Goal Area(s) Addresses:  Patient will verbalize importance of using healthy stress management.  Patient will identify positive emotions associated with healthy stress management.   Intervention: Stress Management  Activity :  Patients were introduced to ways in which they can relieve stress.   Education:  Stress Management, Discharge Planning.   Education Outcome: Acknowledges edcuation/In group clarification offered/Needs additional education  Clinical Observations/Feedback: Pt did not attend group.     Caroll Rancher, LRT/CTRS         Caroll Rancher A 08/30/2018 11:56 AM

## 2018-08-30 NOTE — Progress Notes (Signed)
CSW met with pt to discuss discharge plan.  Pt reports that he has spoken to a cousin, who has said he can start working with him.  Pt does not want to go to [employer] anymore, wants to go back to [facility] and pursue this job.  Michela Pitcher he can still stay at his same current place with his friends.  Will follow up at [facility].  Winferd Humphrey, MSW, LCSW Clinical Social Worker 08/30/2018 1:43 PM

## 2018-08-30 NOTE — Progress Notes (Signed)
D: Pt denies SI/HI/AVH. Pt is pleasant and cooperative. Pt presents flat / depressed , but brightens after engagement.  A: Pt was offered support and encouragement. Pt was given scheduled medications. Pt was encourage to attend groups. Q 15 minute checks were done for safety.  R:Pt attends groups and interacts well with peers and staff. Pt is taking medication. Pt has no complaints.Pt receptive to treatment and safety maintained on unit.  Problem: Education: Goal: Emotional status will improve Outcome: Not Progressing Note:  Pt stated he was doing the "same"   Problem: Education: Goal: Mental status will improve Outcome: Not Progressing Note:  Pt stated he was doing the "same"   Problem: Activity: Goal: Interest or engagement in activities will improve Outcome: Progressing Note:  Pt visible in the dayroom some this evening

## 2018-08-31 DIAGNOSIS — K219 Gastro-esophageal reflux disease without esophagitis: Secondary | ICD-10-CM

## 2018-08-31 NOTE — Progress Notes (Signed)
[facility] MD Progress Note  08/31/2018 10:59 AM AMUN STEMM  MRN:  [id number] Subjective: patient reports some improvement compared to admission, feels less depressed. He presents future oriented, and currently denies suicidal ideations. States he is hoping to get a job that will allow him to afford a place of his own ( currently living with a friend) . Remains ruminative about brother's death, but states his brother had encouraged him to improve his life and live independently and is therefore now more motivated to do so.  Denies medication side effects.   Objective : I have reviewed chart notes and have met with patient. 37 year old male, presented for worsening depression, neuro-vegetative symptoms of depression, suicidal ideations, episode of self cutting on forearms. Reports history of chronic depression, which has worsened after death of his brother earlier this year, who committed suicide. Patient presents with partly improved mood and range of affect, although continues to present depressed and generally sad. As above, currently denies suicidal ideations and presents future oriented . Denies medication side effects ( On Abilify, Remeron, Prozac) .  No disruptive or agitated behaviors on unit, some group participation.      Principal Problem: Depression  Diagnosis:   Patient Active Problem List   Diagnosis Date Noted  . GERD (gastroesophageal reflux disease) [K21.9] 03/11/2018  . Severe recurrent major depression without psychotic features ([place]) [F33.2] 03/09/2018  . Major depressive disorder, recurrent episode ([place]) [F33.9] 05/16/2017  . Tobacco use disorder [F17.200] 07/17/2015  . Headache syndrome [G44.89] 07/17/2015  . Nicotine dependence [F17.200] 07/17/2015  . Generalized anxiety disorder [F41.1] 11/01/2013  . Major depressive disorder, recurrent episode, severe, without mention of psychotic behavior [F33.2] 10/31/2013   Total Time spent with patient: 15 minutes  Past  Psychiatric History:   Past Medical History:  Past Medical History:  Diagnosis Date  . Anxiety   . Depression   . Migraine    History reviewed. No pertinent surgical history. Family History:  Family History  Problem Relation Age of Onset  . Cancer Mother        brain  . Diabetes Brother   . Hypertension Brother    Family Psychiatric  History:  Social History:  Social History   Substance and Sexual Activity  Alcohol Use No     Social History   Substance and Sexual Activity  Drug Use No    Social History   Socioeconomic History  . Marital status: Single    Spouse name: Not on file  . Number of children: Not on file  . Years of education: Not on file  . Highest education level: Not on file  Occupational History  . Not on file  Social Needs  . Financial resource strain: Not on file  . Food insecurity:    Worry: Not on file    Inability: Not on file  . Transportation needs:    Medical: Not on file    Non-medical: Not on file  Tobacco Use  . Smoking status: Current Every Day Smoker    Packs/day: 1.50    Years: 20.00    Pack years: 30.00    Types: Cigarettes  . Smokeless tobacco: Never Used  Substance and Sexual Activity  . Alcohol use: No  . Drug use: No  . Sexual activity: Not on file  Lifestyle  . Physical activity:    Days per week: Not on file    Minutes per session: Not on file  . Stress: Not on file  Relationships  .  Social connections:    Talks on phone: Not on file    Gets together: Not on file    Attends religious service: Not on file    Active member of club or organization: Not on file    Attends meetings of clubs or organizations: Not on file    Relationship status: Not on file  Other Topics Concern  . Not on file  Social History Narrative  . Not on file   Additional Social History:   Sleep: Improving  Appetite:  improving  Current Medications: Current Facility-Administered Medications  Medication Dose Route Frequency Provider  Last Rate Last Dose  . acetaminophen (TYLENOL) tablet 650 mg  650 mg Oral Q6H PRN Lindon Romp A, NP      . alum & mag hydroxide-simeth (MAALOX/MYLANTA) 200-200-20 MG/5ML suspension 30 mL  30 mL Oral Q4H PRN Lindon Romp A, NP      . ARIPiprazole (ABILIFY) tablet 5 mg  5 mg Oral QHS Lindon Romp A, NP   5 mg at 08/30/18 2126  . famotidine (PEPCID) tablet 20 mg  20 mg Oral BID Lindon Romp A, NP   20 mg at 08/31/18 0811  . FLUoxetine (PROZAC) capsule 20 mg  20 mg Oral Daily Lindon Romp A, NP   20 mg at 08/31/18 5681  . hydrOXYzine (ATARAX/VISTARIL) tablet 25 mg  25 mg Oral TID PRN Rozetta Nunnery, NP   25 mg at 08/30/18 1716  . magnesium hydroxide (MILK OF MAGNESIA) suspension 30 mL  30 mL Oral Daily PRN Lindon Romp A, NP      . mirtazapine (REMERON) tablet 7.5 mg  7.5 mg Oral QHS Cobos, Fernando A, MD   7.5 mg at 08/30/18 2126  . nicotine (NICODERM CQ - dosed in mg/24 hours) patch 21 mg  21 mg Transdermal Daily Lindon Romp A, NP   21 mg at 08/31/18 2751    Lab Results:  No results found for this or any previous visit (from the past 48 hour(s)).  Blood Alcohol level:  Lab Results  Component Value Date   ETH <10 08/27/2018   ETH <10 70/11/7492    Metabolic Disorder Labs: Lab Results  Component Value Date   HGBA1C 5.3 08/28/2018   MPG 105 08/28/2018   MPG 105 05/17/2017   Lab Results  Component Value Date   PROLACTIN 6.7 08/28/2018   PROLACTIN 17.3 (H) 05/17/2017   Lab Results  Component Value Date   CHOL 171 08/28/2018   TRIG 107 08/28/2018   HDL 46 08/28/2018   CHOLHDL 3.7 08/28/2018   VLDL 21 08/28/2018   LDLCALC 104 (H) 08/28/2018   LDLCALC 128 (H) 05/17/2017    Physical Findings: AIMS:  , ,  ,  ,    CIWA:    COWS:     Musculoskeletal: Strength & Muscle Tone: within normal limits Gait & Station: normal Patient leans: N/A  Psychiatric Specialty Exam: Physical Exam  ROS no chest pain, no dyspnea,no vomiting , no diarrhea  Blood pressure 101/75, pulse 96,  temperature 98.2 F (36.8 C), temperature source Oral, resp. rate 20, height '5\' 8"'  (1.727 m), weight 77.1 kg, SpO2 97 %.Body mass index is 25.85 kg/m.  General Appearance: Fairly Groomed  Eye Contact:  Good  Speech:  Normal Rate  Volume:  Normal  Mood:  some improvement, less severely depressed  Affect:  remains constricted, but to lesser degree, smiles at times appropriately  Thought Process:  Linear and Descriptions of Associations: Intact  Orientation:  Other:  Fully alert and attentive  Thought Content:  No hallucinations, no delusions  Suicidal Thoughts:  No-currently denies suicidal ideations, denies homicidal or self-injurious ideations, contracts for safety on unit  Homicidal Thoughts:  No  Memory:  Recent and remote grossly intact  Judgement: improving  Insight:  Fair- improving   Psychomotor Activity:  improving   Concentration:  Concentration: Good and Attention Span: Good  Recall:  Good  Fund of Knowledge:  Good  Language:  Good  Akathisia:  Negative  Handed:  Left  AIMS (if indicated):     Assets:  Communication Skills Desire for Improvement Resilience  ADL's:  Intact  Cognition:  WNL  Sleep:  Number of Hours: 6.5   Assessment-37 year old male, presented for worsening depression, neuro-vegetative symptoms of depression, suicidal ideations, episode of self cutting on forearms. Reports history of chronic depression, which has worsened after death of his brother earlier this year, who committed suicide.  Patient is still depressed, but is  presenting with partially/gradually improving mood. Denies suicidal ideations at this time. Currently tolerating medications well .  Treatment Plan Summary: Daily contact with patient to assess and evaluate symptoms and progress in treatment, Medication management, Plan Inpatient treatment and Medications as below  Treatment Plan reviewed as below today 10/4  Encourage group and milieu participation Treatment team working on  disposition planning Continue Remeron 7.5 mg nightly for depression and insomnia Continue Prozac 20 mg daily for depression Continue Abilify 5 mg daily for augmentation/mood disorder Continue Famotidine for GERD symptoms    Jenne Campus, MD 08/31/2018, 10:59 AM   Patient ID: Kathlynn Grate, male   DOB: 08/03/1981, 37 y.o.   MRN: [id number]

## 2018-08-31 NOTE — [facility] Group Notes (Signed)
LCSW Group Therapy Note  08/31/2018   10:00-11:00am   Type of Therapy and Topic:  Group Therapy: Anger Cues and Responses  Participation Level:  Minimal   Description of Group:   In this group, patients learned how to recognize the physical, cognitive, emotional, and behavioral responses they have to anger-provoking situations.  They identified a recent time they became angry and how they reacted.  They analyzed how their reaction was possibly beneficial and how it was possibly unhelpful.  The group discussed a variety of healthier coping skills that could help with such a situation in the future.  Deep breathing was practiced briefly.  Therapeutic Goals: 1. Patients will remember their last incident of anger and how they felt emotionally and physically, what their thoughts were at the time, and how they behaved. 2. Patients will identify how their behavior at that time worked for them, as well as how it worked against them. 3. Patients will explore possible new behaviors to use in future anger situations. 4. Patients will learn that anger itself is normal and cannot be eliminated, and that healthier reactions can assist with resolving conflict rather than worsening situations.  Summary of Patient Progress:  The patient shared that his most recent time of anger was 5 days ago and said his anger was "as high as it could go."  He listened attentively but was very reluctant to interact.  Therapeutic Modalities:   Cognitive Behavioral Therapy  Lynnell Chad

## 2018-08-31 NOTE — Progress Notes (Signed)
Adult Psychoeducational Group Note  Date:  08/31/2018 Time:  3:29 AM  Group Topic/Focus:  Wrap-Up Group:   The focus of this group is to help patients review their daily goal of treatment and discuss progress on daily workbooks.  Participation Level:  Active  Participation Quality:  Appropriate  Affect:  Appropriate  Cognitive:  Appropriate  Insight: Appropriate and Good  Engagement in Group:  Engaged  Modes of Intervention:  Discussion  Additional Comments:  Pt day was a 6. He stayed up all day was one positive thing that happened to him.  Charna Busman Long 08/31/2018, 3:29 AM

## 2018-08-31 NOTE — Plan of Care (Signed)
Patient slept poor last night, sleep medication was not requested. Appetite has been good, energy level normal, concentration good. Depression, hopelessness, and anxiety rated 0, 0, 3. Denies SI HI AVH. Denies physical problems. Patient's goal is to "stay awake and focus."   Problem: Education: Goal: Mental status will improve Outcome: Progressing   Problem: Education: Goal: Verbalization of understanding the information provided will improve Outcome: Progressing   Problem: Activity: Goal: Interest or engagement in activities will improve Outcome: Progressing

## 2018-09-01 LAB — FOLATE: Folate: 6 ng/mL (ref >5.9)

## 2018-09-01 LAB — VITAMIN B12: Vitamin B-12: 228 pg/mL (ref 180–914)

## 2018-09-01 NOTE — Progress Notes (Signed)
Adult Psychoeducational Group Note  Date:  09/01/2018 Time:  8:39 PM     Group Topic/Focus:  Wrap-Up Group:   The focus of this group is to help patients review their daily goal of treatment and discuss progress on daily workbooks.  Participation Level:  Active  Participation Quality:  Appropriate  Affect:  Appropriate  Cognitive:  Appropriate  Insight: Appropriate and Good  Engagement in Group:  Engaged  Modes of Intervention:  Discussion  Additional Comments:  His if he had to rate was 8. The one positive thing that happened to him today he gets to go home tomorrow. He attend other groups today.  Charna Busman Long 09/01/2018, 8:39 PM

## 2018-09-01 NOTE — [facility] Group Notes (Signed)
[facility] Group Notes:  (Nursing/MHT/Case Management/Adjunct)  Date:  09/01/2018  Time:  2:56 PM  Type of Therapy:  Nurse Education  Participation Level:  Active  Participation Quality:  Appropriate and Attentive  Affect:  Appropriate  Cognitive:  Alert and Appropriate  Insight:  Good  Engagement in Group:  Engaged and Improving  Modes of Intervention:  Discussion and Education  Summary of Progress/Problems: In this group, we discussed stress management and coping skills.  Kirstie Mirza 09/01/2018, 2:56 PM

## 2018-09-01 NOTE — Progress Notes (Signed)
Writer has observed patient up in the dayroom watching tv and interacting minimal with peers. He reports having had a good day but is bored. He was informed of his medications and reports that the remeron has helped him to sleep. Support given and safety maintained on unit with 15 min checks.

## 2018-09-01 NOTE — Plan of Care (Signed)
  Problem: Activity: Goal: Interest or engagement in activities will improve Outcome: Progressing   Problem: Safety: Goal: Periods of time without injury will increase Outcome: Progressing  DAR NOTE: Patient presents with anxious affect and depressed mood.  Denies suicidal thoughts, pain, auditory and visual hallucinations.  Rates depression at 0, hopelessness at 0, and anxiety at 3.  Maintained on routine safety checks.  Medications given as prescribed.  Support and encouragement offered as needed.  Attended group and participated.  Patient visible in milieu with minimal interactions.  Offered no complaint.

## 2018-09-01 NOTE — Progress Notes (Signed)
[facility] MD Progress Note  09/01/2018 12:04 PM Angel [name]  MRN:  [id number] Subjective: patient reports he is feeling better, less depressed. At this time denies suicidal ideations.  Denies medication side effects.  Objective : I have reviewed chart notes and have met with patient. 37 year old male, presented for worsening depression, neuro-vegetative symptoms of depression, suicidal ideations, episode of self cutting on forearms. Reports history of chronic depression, which has worsened after death of his brother earlier this year, who committed suicide. Presents calm, polite on approach. Affect remains constricted, but to lesser degree than on admission , and tends to brighten during session,smiling at times appropriately. Currently denies suicidal ideations, and presents future oriented, stating that he is wanting to get a job and to move out independently once he can afford it  ( at present lives with a friend, and states his plan is to return to live with said friend at discharge ) .  Denies medication side effects.  No disruptive or agitated behaviors on unit, going to some groups , has been visible in day room, although interaction with peers remains limited .  Labs- vitamin serum levels were  ordered due to reported decreased  PO intake prior to admission and being on famotidine-  Vitamin B12 228, Folate 6.0       Principal Problem: Depression  Diagnosis:   Patient Active Problem List   Diagnosis Date Noted  . GERD (gastroesophageal reflux disease) [K21.9] 03/11/2018  . Severe recurrent major depression without psychotic features ([place]) [F33.2] 03/09/2018  . Major depressive disorder, recurrent episode ([place]) [F33.9] 05/16/2017  . Tobacco use disorder [F17.200] 07/17/2015  . Headache syndrome [G44.89] 07/17/2015  . Nicotine dependence [F17.200] 07/17/2015  . Generalized anxiety disorder [F41.1] 11/01/2013  . Major depressive disorder, recurrent episode, severe, without mention of  psychotic behavior [F33.2] 10/31/2013   Total Time spent with patient: 15 minutes  Past Psychiatric History:   Past Medical History:  Past Medical History:  Diagnosis Date  . Anxiety   . Depression   . Migraine    History reviewed. No pertinent surgical history. Family History:  Family History  Problem Relation Age of Onset  . Cancer Mother        brain  . Diabetes Brother   . Hypertension Brother    Family Psychiatric  History:  Social History:  Social History   Substance and Sexual Activity  Alcohol Use No     Social History   Substance and Sexual Activity  Drug Use No    Social History   Socioeconomic History  . Marital status: Single    Spouse name: Not on file  . Number of children: Not on file  . Years of education: Not on file  . Highest education level: Not on file  Occupational History  . Not on file  Social Needs  . Financial resource strain: Not on file  . Food insecurity:    Worry: Not on file    Inability: Not on file  . Transportation needs:    Medical: Not on file    Non-medical: Not on file  Tobacco Use  . Smoking status: Current Every Day Smoker    Packs/day: 1.50    Years: 20.00    Pack years: 30.00    Types: Cigarettes  . Smokeless tobacco: Never Used  Substance and Sexual Activity  . Alcohol use: No  . Drug use: No  . Sexual activity: Not on file  Lifestyle  . Physical activity:  Days per week: Not on file    Minutes per session: Not on file  . Stress: Not on file  Relationships  . Social connections:    Talks on phone: Not on file    Gets together: Not on file    Attends religious service: Not on file    Active member of club or organization: Not on file    Attends meetings of clubs or organizations: Not on file    Relationship status: Not on file  Other Topics Concern  . Not on file  Social History Narrative  . Not on file   Additional Social History:   Sleep: Good  Appetite:  improving  Current  Medications: Current Facility-Administered Medications  Medication Dose Route Frequency Provider Last Rate Last Dose  . acetaminophen (TYLENOL) tablet 650 mg  650 mg Oral Q6H PRN Lindon Romp A, NP      . alum & mag hydroxide-simeth (MAALOX/MYLANTA) 200-200-20 MG/5ML suspension 30 mL  30 mL Oral Q4H PRN Lindon Romp A, NP      . ARIPiprazole (ABILIFY) tablet 5 mg  5 mg Oral QHS Lindon Romp A, NP   5 mg at 08/31/18 2124  . famotidine (PEPCID) tablet 20 mg  20 mg Oral BID Lindon Romp A, NP   20 mg at 09/01/18 0752  . FLUoxetine (PROZAC) capsule 20 mg  20 mg Oral Daily Lindon Romp A, NP   20 mg at 09/01/18 7530  . hydrOXYzine (ATARAX/VISTARIL) tablet 25 mg  25 mg Oral TID PRN Rozetta Nunnery, NP   25 mg at 08/30/18 1716  . magnesium hydroxide (MILK OF MAGNESIA) suspension 30 mL  30 mL Oral Daily PRN Lindon Romp A, NP      . mirtazapine (REMERON) tablet 7.5 mg  7.5 mg Oral QHS Insiya Oshea, Myer Peer, MD   7.5 mg at 08/31/18 2124  . nicotine (NICODERM CQ - dosed in mg/24 hours) patch 21 mg  21 mg Transdermal Daily Lindon Romp A, NP   21 mg at 09/01/18 0511    Lab Results:  Results for orders placed or performed during the hospital encounter of 08/28/18 (from the past 48 hour(s))  Vitamin B12     Status: None   Collection Time: 09/01/18  6:16 AM  Result Value Ref Range   Vitamin B-12 228 180 - 914 pg/mL    Comment: (NOTE) This assay is not validated for testing neonatal or myeloproliferative syndrome specimens for Vitamin B12 levels. Performed at [facility], [place] [address] [place], [place] [address]   Folate     Status: None   Collection Time: 09/01/18  6:16 AM  Result Value Ref Range   Folate 6.0 >5.9 ng/mL    Comment: Performed at [facility], [place] [address] [place], [place] [address]    Blood Alcohol level:  Lab Results  Component Value Date   ETH <10 08/27/2018   ETH <10 01/41/0301    Metabolic Disorder Labs: Lab Results  Component  Value Date   HGBA1C 5.3 08/28/2018   MPG 105 08/28/2018   MPG 105 05/17/2017   Lab Results  Component Value Date   PROLACTIN 6.7 08/28/2018   PROLACTIN 17.3 (H) 05/17/2017   Lab Results  Component Value Date   CHOL 171 08/28/2018   TRIG 107 08/28/2018   HDL 46 08/28/2018   CHOLHDL 3.7 08/28/2018   VLDL 21 08/28/2018   LDLCALC 104 (H) 08/28/2018   LDLCALC 128 (H) 05/17/2017  Physical Findings: AIMS:  , ,  ,  ,    CIWA:    COWS:     Musculoskeletal: Strength & Muscle Tone: within normal limits Gait & Station: normal Patient leans: N/A  Psychiatric Specialty Exam: Physical Exam  ROS no chest pain, no dyspnea,no vomiting , no diarrhea  Blood pressure 101/75, pulse 96, temperature 98.2 F (36.8 C), temperature source Oral, resp. rate 20, height '5\' 8"'  (1.727 m), weight 77.1 kg, SpO2 97 %.Body mass index is 25.85 kg/m.  General Appearance: Fairly Groomed  Eye Contact:  Good  Speech:  Normal Rate  Volume:  Normal  Mood:  reports mood is better   Affect:  partially improved, less constricted, smiles at times appropriately   Thought Process:  Linear and Descriptions of Associations: Intact  Orientation:  Other:  Fully alert and attentive  Thought Content:  No hallucinations, no delusions  Suicidal Thoughts:  No-currently denies suicidal ideations, denies homicidal or self-injurious ideations, contracts for safety on unit  Homicidal Thoughts:  No  Memory:  Recent and remote grossly intact  Judgement: improving  Insight:  Fair- improving   Psychomotor Activity:  improving   Concentration:  Concentration: Good and Attention Span: Good  Recall:  Good  Fund of Knowledge:  Good  Language:  Good  Akathisia:  Negative  Handed:  Left  AIMS (if indicated):     Assets:  Communication Skills Desire for Improvement Resilience  ADL's:  Intact  Cognition:  WNL  Sleep:  Number of Hours: 6.75   Assessment-37 year old male, presented for worsening depression, neuro-vegetative  symptoms of depression, suicidal ideations, episode of self cutting on forearms. Reports history of chronic depression, which has worsened after death of his brother earlier this year, who committed suicide.  Patient is reporting improving mood and affect , although still tends to be constricted, has become more reactive . At this time minimizes neuro-vegetative symptoms of depression and denies suicidal ideations, presents future oriented. Tolerating Prozac/Abilify/Remeron well thus far.  Treatment Plan Summary: Daily contact with patient to assess and evaluate symptoms and progress in treatment, Medication management, Plan Inpatient treatment and Medications as below  Treatment Plan reviewed as below today 10/6 Encourage group and milieu participation Treatment team working on disposition planning Continue Remeron 7.5 mg nightly for depression and insomnia Continue Prozac 20 mg daily for depression Continue Abilify 5 mg daily for augmentation/mood disorder Continue Famotidine for GERD symptoms    Jenne Campus, MD 09/01/2018, 12:04 PM   Patient ID: Kathlynn Grate, male   DOB: 1981/08/06, 37 y.o.   MRN: [id number]

## 2018-09-01 NOTE — [facility] Group Notes (Signed)
[facility] LCSW Group Therapy Note  09/01/2018  10:00-11:00AM  Type of Therapy and Topic:  Group Therapy:  Adding Supports Including Being Your Own Support  Participation Level:  Minimal   Description of Group:  Patients in this group were introduced to the concept that additional supports including self-support are an essential part of recovery.  A song entitled "I Need Help!" was played and a group discussion was held in reaction to the idea of needing to add supports.  A song entitled "My Own Hero" was played and a group discussion ensued in which patients stated they could relate to the song and it inspired them to realize they have be willing to help themselves in order to succeed, because other people cannot achieve sobriety or stability for them.  We discussed adding a variety of healthy supports to address the various needs in their lives.  A song was played called "I Know Where I've Been" toward the end of group and used to conduct an inspirational wrap-up to group of remembering how far they have already come in their journey.  Therapeutic Goals: 1)  demonstrate the importance of being a part of one's own support system 2)  discuss reasons people in one's life may eventually be unable to be continually supportive  3)  identify the patient's current support system and   4)  elicit commitments to add healthy supports and to become more conscious of being self-supportive   Summary of Patient Progress:  The patient expressed that his brother is a healthy support while his sister is an unhealthy support.   He did not talk in group but did listen, while lounging.  Therapeutic Modalities:   Motivational Interviewing Activity  Lynnell Chad

## 2018-09-02 LAB — VITAMIN D 25 HYDROXY (VIT D DEFICIENCY, FRACTURES): Vit D, 25-Hydroxy: 20.2 ng/mL — ABNORMAL LOW (ref 30.0–100.0)

## 2018-09-02 MED ORDER — ARIPIPRAZOLE 5 MG PO TABS: 5.0000 mg | ORAL_TABLET | Freq: Every day | ORAL | 0 refills | Status: DC

## 2018-09-02 MED ORDER — FLUOXETINE HCL 20 MG PO CAPS: 20.0000 mg | ORAL_CAPSULE | Freq: Every day | ORAL | 0 refills | Status: DC

## 2018-09-02 MED ORDER — MIRTAZAPINE 7.5 MG PO TABS: 7.5000 mg | ORAL_TABLET | Freq: Every day | ORAL | 0 refills | Status: DC

## 2018-09-02 MED ORDER — FAMOTIDINE 20 MG PO TABS: 20.0000 mg | ORAL_TABLET | Freq: Two times a day (BID) | ORAL | 0 refills | Status: DC

## 2018-09-02 NOTE — Discharge Summary (Addendum)
Physician Discharge Summary Note  Patient:  Angel [name] is an 37 y.o., male MRN:  [id number] DOB:  [birth year] Patient phone:  [phone] (home)  Patient address:   [address] [place] [address],  Total Time spent with patient: 15 minutes  Date of Admission:  08/28/2018 Date of Discharge:  09/02/2018  Reason for Admission:  Per assessment note: 37 year old male, presented to ED via [facility] on 10/1 due to suicidal ideations, and episode of self cutting on both forearms- multiple self inflicted superficial scratches and lacerations visible on both forearms ( did not require sutures) . Reports recent suicidal ideations, with thoughts of overdosing or hanging self .States he contacted [facility] due to worsening symptoms. Patient reports history of depression and states he has felt more depressed over the last 2 months , which he attributes partly to the death by suicide of his brother earlier this year in July.Endorses neuro-vegetative symptoms of depression as below. Denies psychotic symptoms.States he has been compliant with psychiatric medications - currently reports being on Celexa , Trazodone without side effects.  Principal Problem: Severe recurrent major depression without psychotic features [facility]) Discharge Diagnoses: Patient Active Problem List   Diagnosis Date Noted  . GERD (gastroesophageal reflux disease) [K21.9] 03/11/2018  . Severe recurrent major depression without psychotic features ([facility]) [F33.2] 03/09/2018  . Major depressive disorder, recurrent episode ([facility]) [F33.9] 05/16/2017  . Tobacco use disorder [F17.200] 07/17/2015  . Headache syndrome [G44.89] 07/17/2015  . Nicotine dependence [F17.200] 07/17/2015  . Generalized anxiety disorder [F41.1] 11/01/2013  . Major depressive disorder, recurrent episode, severe, without mention of psychotic behavior [F33.2] 10/31/2013    Past Psychiatric History:   Past Medical History:  Past Medical History:  Diagnosis Date  . Anxiety   .  Depression   . Migraine    History reviewed. No pertinent surgical history. Family History:  Family History  Problem Relation Age of Onset  . Cancer Mother        brain  . Diabetes Brother   . Hypertension Brother    Family Psychiatric  History:  Social History:  Social History   Substance and Sexual Activity  Alcohol Use No     Social History   Substance and Sexual Activity  Drug Use No    Social History   Socioeconomic History  . Marital status: Single    Spouse name: Not on file  . Number of children: Not on file  . Years of education: Not on file  . Highest education level: Not on file  Occupational History  . Not on file  Social Needs  . Financial resource strain: Not on file  . Food insecurity:    Worry: Not on file    Inability: Not on file  . Transportation needs:    Medical: Not on file    Non-medical: Not on file  Tobacco Use  . Smoking status: Current Every Day Smoker    Packs/day: 1.50    Years: 20.00    Pack years: 30.00    Types: Cigarettes  . Smokeless tobacco: Never Used  Substance and Sexual Activity  . Alcohol use: No  . Drug use: No  . Sexual activity: Not on file  Lifestyle  . Physical activity:    Days per week: Not on file    Minutes per session: Not on file  . Stress: Not on file  Relationships  . Social connections:    Talks on phone: Not on file    Gets together: Not  on file    Attends religious service: Not on file    Active member of club or organization: Not on file    Attends meetings of clubs or organizations: Not on file    Relationship status: Not on file  Other Topics Concern  . Not on file  Social History Narrative  . Not on file    Hospital Course:  Angel [name] was admitted for Severe recurrent major depression without psychotic features [name]) and crisis management.  Pt was treated discharged with the medications listed below under Medication List.  Medical problems were identified and treated as needed.   Home medications were restarted as appropriate.  Improvement was monitored by observation and Angel [name] 's daily report of symptom reduction.  Emotional and mental status was monitored by daily self-inventory reports completed by Angel [name] and clinical staff.         Angel [name] was evaluated by the treatment team for stability and plans for continued recovery upon discharge. Angel [name] 's motivation was an integral factor for scheduling further treatment. Employment, transportation, bed availability, health status, family support, and any pending legal issues were also considered during hospital stay. Pt was offered further treatment options upon discharge including but not limited to Residential, Intensive Outpatient, and Outpatient treatment.  Angel [name] will follow up with the services as listed below under Follow Up Information.     Upon completion of this admission the patient was both mentally and medically stable for discharge denying suicidal/homicidal ideation, auditory/visual/tactile hallucinations, delusional thoughts and paranoia.    Angel [name] responded well to treatment with Abilify 5 mg, Prozac 20 mg and Remeron 7.5mg , lipid panel and without adverse effects. Pt demonstrated improvement without reported or observed adverse effects to the point of stability appropriate for outpatient management. Pertinent labs include: vitamin d 20.2, for which outpatient follow-up is necessary for lab recheck as mentioned below. Reviewed CBC, CMP, BAL, and UDS+ TSH ; all unremarkable aside from noted exceptions.   Physical Findings: AIMS:  , ,  ,  ,    CIWA:    COWS:     Musculoskeletal: Strength & Muscle Tone: within normal limits Gait & Station: normal Patient leans: N/A  Psychiatric Specialty Exam:  See SRA by MD Physical Exam  Vitals reviewed. Constitutional: He appears well-developed.  Cardiovascular: Normal rate.  Neurological: He is alert.  Psychiatric:  He has a normal mood and affect. His behavior is normal.    Review of Systems  Psychiatric/Behavioral: Negative for depression (stable) and suicidal ideas. The patient is not nervous/anxious.     Blood pressure 102/89, pulse 81, temperature 98.4 F (36.9 C), temperature source Oral, resp. rate 20, height 5\' 8"  (1.727 m), weight 77.1 kg, SpO2 97 %.Body mass index is 25.85 kg/m.      Has this patient used any form of tobacco in the last 30 days? (Cigarettes, Smokeless Tobacco, Cigars, and/or Pipes) No  Blood Alcohol level:  Lab Results  Component Value Date   ETH <10 08/27/2018   ETH <10 03/09/2018    Metabolic Disorder Labs:  Lab Results  Component Value Date   HGBA1C 5.3 08/28/2018   MPG 105 08/28/2018   MPG 105 05/17/2017   Lab Results  Component Value Date   PROLACTIN 6.7 08/28/2018   PROLACTIN 17.3 (H) 05/17/2017   Lab Results  Component Value Date   CHOL 171 08/28/2018   TRIG 107 08/28/2018   HDL 46 08/28/2018  CHOLHDL 3.7 08/28/2018   VLDL 21 08/28/2018   LDLCALC 104 (H) 08/28/2018   LDLCALC 128 (H) 05/17/2017    See Psychiatric Specialty Exam and Suicide Risk Assessment completed by Attending Physician prior to discharge.  Discharge destination:  Home  Is patient on multiple antipsychotic therapies at discharge:  No   Has Patient had three or more failed trials of antipsychotic monotherapy by history:  No  Recommended Plan for Multiple Antipsychotic Therapies: NA  Discharge Instructions    Diet - low sodium heart healthy   Complete by:  As directed    Increase activity slowly   Complete by:  As directed      Allergies as of 09/02/2018   No Known Allergies     Medication List    STOP taking these medications   citalopram 20 MG tablet Commonly known as:  CELEXA     TAKE these medications     Indication  ARIPiprazole 5 MG tablet Commonly known as:  ABILIFY Take 1 tablet (5 mg total) by mouth at bedtime.  Indication:  Major Depressive  Disorder, Mood control   famotidine 20 MG tablet Commonly known as:  PEPCID Take 1 tablet (20 mg total) by mouth 2 (two) times daily.  Indication:  Gastroesophageal Reflux Disease   FLUoxetine 20 MG capsule Commonly known as:  PROZAC Take 1 capsule (20 mg total) by mouth daily. Start taking on:  09/03/2018  Indication:  Depression   mirtazapine 7.5 MG tablet Commonly known as:  REMERON Take 1 tablet (7.5 mg total) by mouth at bedtime.  Indication:  Major Depressive Disorder   nicotine 21 mg/24hr patch Commonly known as:  NICODERM CQ - dosed in mg/24 hours Place 1 patch (21 mg total) onto the skin daily. (May buy from over the counter): For smoking cessation  Indication:  Nicotine Addiction      Follow-up Information    [facility], [facility]. Go on 09/05/2018.   Why:  Please attend your appt on Thursday, 09/05/18, at 11:00am.  Please bring photo ID, social security card, and hospital discharge paperwork. Contact information: [address] [place] [address] [phone]           Follow-up recommendations:  Activity:  as tolerated Diet:  heart healthy   Comments:  Take all medications as prescribed. Keep all follow-up appointments as scheduled.  Do not consume alcohol or use illegal drugs while on prescription medications. Report any adverse effects from your medications to your primary care provider promptly.  In the event of recurrent symptoms or worsening symptoms, call 911, a crisis hotline, or go to the nearest emergency department for evaluation.   Signed: Oneta Rack, NP 09/02/2018, 2:15 PM   Patient seen, Suicide Assessment Completed.  Disposition Plan Reviewed

## 2018-09-02 NOTE — Progress Notes (Signed)
Patient has been up and active on the unit, attended group this evening and has voiced no complaints. Patient currently denies having pain, -si/hi/a/v hall.He is looking forward to discharging tomorrow and go fishing. Support and encouragement offered, safety maintained on unit, will continue to monitor.

## 2018-09-02 NOTE — Progress Notes (Signed)
  [facility] Adult Case Management Discharge Plan :  Will you be returning to the same living situation after discharge:  Yes,  with friends At discharge, do you have transportation home?: Yes,  brother Do you have the ability to pay for your medications: No. Pt will work with [facility]  Release of information consent forms completed and in the chart;  Patient's signature needed at discharge.  Patient to Follow up at: Follow-up Information    [facility]. Go on 09/05/2018.   Why:  Please attend your appt on Thursday, 09/05/18, at 11:00am.  Please bring photo ID, social security card, and hospital discharge paperwork. Contact information: [address] [place] [address] [phone]           Next level of care provider has access to [facility]:no  Safety Planning and Suicide Prevention discussed: No. Attempts made.  SPE completed with pt.     Has patient been referred to the Quitline?: Patient refused referral  Patient has been referred for addiction treatment: Pt. refused referral  Lorri Frederick, LCSW 09/02/2018, 9:26 AM

## 2018-09-02 NOTE — [facility] Suicide Risk Assessment (Signed)
[facility] Discharge Suicide Risk Assessment   Principal Problem: MDD Discharge Diagnoses:  Patient Active Problem List   Diagnosis Date Noted  . GERD (gastroesophageal reflux disease) [K21.9] 03/11/2018  . Severe recurrent major depression without psychotic features (HCC) [F33.2] 03/09/2018  . Major depressive disorder, recurrent episode (HCC) [F33.9] 05/16/2017  . Tobacco use disorder [F17.200] 07/17/2015  . Headache syndrome [G44.89] 07/17/2015  . Nicotine dependence [F17.200] 07/17/2015  . Generalized anxiety disorder [F41.1] 11/01/2013  . Major depressive disorder, recurrent episode, severe, without mention of psychotic behavior [F33.2] 10/31/2013    Total Time spent with patient: 30 minutes  Musculoskeletal: Strength & Muscle Tone: within normal limits Gait & Station: normal Patient leans: N/A  Psychiatric Specialty Exam: ROS no headache, no chest pain, no shortness of breath, no current  GERD symptoms endorsed  Blood pressure 102/89, pulse 81, temperature 98.4 F (36.9 C), temperature source Oral, resp. rate 20, height 5\' 8"  (1.727 m), weight 77.1 kg, SpO2 97 %.Body mass index is 25.85 kg/m.  General Appearance: improving grooming  Eye Contact::  Good  Speech:  Normal Rate409  Volume:  Normal  Mood:  improved, states " I feel a whole lot better"  Affect:  has become more reactive , smiles at times during session  Thought Process:  Linear and Descriptions of Associations: Intact  Orientation:  Full (Time, Place, and Person)  Thought Content:  no hallucinations , no delusions   Suicidal Thoughts:  No denies suicidal or self injurious ideations, no homicidal or violent ideations  Homicidal Thoughts:  No  Memory:  recent and remote grossly intact   Judgement:  Other:  improving   Insight:  improving   Psychomotor Activity:  Normal  Concentration:  Good  Recall:  Good  Fund of Knowledge:Good  Language: Good  Akathisia:  Negative  Handed:  Right  AIMS (if indicated):      Assets:  Desire for Improvement Resilience  Sleep:  Number of Hours: 6.75  Cognition: WNL  ADL's:  Intact   Mental Status Per Nursing Assessment::   On Admission:  Suicidal ideation indicated by patient  Demographic Factors:  37, single, no children, lives with friend  Loss Factors: Brother died ( committed suicide ) 2 months ago  Historical Factors: History of depression, history of prior psychiatric admissions, history of self cutting, no history of prior suicide attempts  Risk Reduction Factors:   Living with another person, especially a relative and Positive coping skills or problem solving skills  Continued Clinical Symptoms:  At this time patient presents alert, attentive, better groomed, reports much improved mood and affect presents more reactive, fuller in range, no thought disorder, no SI, no HI, no hallucinations,no delusions, future oriented, stating that he plans to get a job and live independently when he can afford it. No medication side effects. Behavior on unit in good control, no disruptive or agitated behaviors on unit   Cognitive Features That Contribute To Risk:  No gross cognitive deficits noted upon discharge. Is alert , attentive, and oriented x 3    Suicide Risk:  Mild:  Suicidal ideation of limited frequency, intensity, duration, and specificity.  There are no identifiable plans, no associated intent, mild dysphoria and related symptoms, good self-control (both objective and subjective assessment), few other risk factors, and identifiable protective factors, including available and accessible social support.    Plan Of Care/Follow-up recommendations:  Activity:  as tolerated  Diet:  regular Tests:  NA Other:  See below  Patient expresses desire and  readiness for discharge, leaving unit in good spirits  Plans to return to live with his friend. Follows up at [facility] for outpatient psychiatric management .   Craige Cotta, MD 09/02/2018, 7:59  AM

## 2020-01-12 ENCOUNTER — Encounter (HOSPITAL_COMMUNITY): Payer: Self-pay

## 2020-01-12 ENCOUNTER — Other Ambulatory Visit: Payer: Self-pay

## 2020-01-12 ENCOUNTER — Emergency Department (HOSPITAL_COMMUNITY)
Admission: EM | Admit: 2020-01-12 | Discharge: 2020-01-12 | Disposition: A | Discharge status: Home / Self Care | Payer: Self-pay | Attending: Emergency Medicine | Admitting: Emergency Medicine

## 2020-01-12 DIAGNOSIS — K029 Dental caries, unspecified: Secondary | ICD-10-CM | POA: Insufficient documentation

## 2020-01-12 DIAGNOSIS — K047 Periapical abscess without sinus: Secondary | ICD-10-CM | POA: Insufficient documentation

## 2020-01-12 DIAGNOSIS — F17210 Nicotine dependence, cigarettes, uncomplicated: Secondary | ICD-10-CM | POA: Insufficient documentation

## 2020-01-12 DIAGNOSIS — Z79899 Other long term (current) drug therapy: Secondary | ICD-10-CM | POA: Insufficient documentation

## 2020-01-12 MED ORDER — AMOXICILLIN-POT CLAVULANATE 875-125 MG PO TABS: 1.0000 | ORAL_TABLET | Freq: Two times a day (BID) | ORAL | 0 refills | Status: DC

## 2020-01-12 MED ORDER — NAPROXEN 500 MG PO TABS: 500.0000 mg | ORAL_TABLET | Freq: Two times a day (BID) | ORAL | 0 refills | Status: DC

## 2020-01-12 MED ORDER — AMOXICILLIN-POT CLAVULANATE 875-125 MG PO TABS
1.0000 | ORAL_TABLET | Freq: Once | ORAL | Status: AC
  Administered 2020-01-12: 18:00:00 1 via ORAL
  Filled 2020-01-12: qty 1

## 2020-01-12 MED ORDER — LIDOCAINE HCL (PF) 1 % IJ SOLN
5.0000 mL | Freq: Once | INTRAMUSCULAR | Status: AC
  Administered 2020-01-12: 19:00:00 5 mL
  Filled 2020-01-12: qty 6

## 2020-01-12 NOTE — ED Provider Notes (Signed)
[facility] EMERGENCY DEPARTMENT Provider Note   CSN: [id number] Arrival date & time: 01/12/20  1638     History Chief Complaint  Patient presents with  . Dental Pain    Angel [name] is a 39 y.o. male with a history of anxiety, depression, migraines, GERD, & tobacco use who presents to the ED with complaints of dental pain over the past 1 week. Patient states pain is to the upper central area of the gumline, it is constant, worse with chewing, no alleviating factors. Does not see a dentist. Denies fever, chills, intra-oral drainage, vomiting, sore throat, dysphagia, voice change, or neck stiffness.   HPI     Past Medical History:  Diagnosis Date  . Anxiety   . Depression   . Migraine     Patient Active Problem List   Diagnosis Date Noted  . GERD (gastroesophageal reflux disease) 03/11/2018  . Severe recurrent major depression without psychotic features ([place]) 03/09/2018  . Major depressive disorder, recurrent episode ([place]) 05/16/2017  . Tobacco use disorder 07/17/2015  . Headache syndrome 07/17/2015  . Nicotine dependence 07/17/2015  . Generalized anxiety disorder 11/01/2013  . Major depressive disorder, recurrent episode, severe, without mention of psychotic behavior 10/31/2013    History reviewed. No pertinent surgical history.     Family History  Problem Relation Age of Onset  . Cancer Mother        brain  . Diabetes Brother   . Hypertension Brother     Social History   Tobacco Use  . Smoking status: Current Every Day Smoker    Packs/day: 1.50    Years: 20.00    Pack years: 30.00    Types: Cigarettes  . Smokeless tobacco: Never Used  Substance Use Topics  . Alcohol use: No  . Drug use: No    Home Medications Prior to Admission medications   Medication Sig Start Date End Date Taking? Authorizing Provider  ARIPiprazole (ABILIFY) 5 MG tablet Take 1 tablet (5 mg total) by mouth at bedtime. 09/02/18   Derrill Center, NP  famotidine (PEPCID) 20 MG  tablet Take 1 tablet (20 mg total) by mouth 2 (two) times daily. 09/02/18   Derrill Center, NP  FLUoxetine (PROZAC) 20 MG capsule Take 1 capsule (20 mg total) by mouth daily. 09/03/18   Derrill Center, NP  mirtazapine (REMERON) 7.5 MG tablet Take 1 tablet (7.5 mg total) by mouth at bedtime. 09/02/18   Derrill Center, NP  nicotine (NICODERM CQ - DOSED IN MG/24 HOURS) 21 mg/24hr patch Place 1 patch (21 mg total) onto the skin daily. (May buy from over the counter): For smoking cessation Patient not taking: Reported on 08/27/2018 03/16/18   Lindell Spar I, NP    Allergies    Patient has no known allergies.  Review of Systems   Review of Systems  Constitutional: Negative for chills and fever.  HENT: Positive for dental problem. Negative for congestion, ear pain, facial swelling, sore throat, trouble swallowing and voice change.   Respiratory: Negative for cough and shortness of breath.   Gastrointestinal: Negative for vomiting.  Musculoskeletal: Negative for neck stiffness.    Physical Exam Updated Vital Signs BP 116/88 (BP Location: Right Arm)   Pulse 91   Temp (!) 97.5 F (36.4 C) (Oral)   Resp 16   Ht 5\' 9"  (1.753 m)   Wt 78.9 kg   SpO2 99%   BMI 25.70 kg/m   Physical Exam Vitals and nursing note reviewed.  Constitutional:      General: He is not in acute distress.    Appearance: He is well-developed. He is not toxic-appearing.  HENT:     Head: Normocephalic and atraumatic.     Right Ear: Tympanic membrane is not perforated, erythematous, retracted or bulging.     Left Ear: Tympanic membrane is not perforated, erythematous, retracted or bulging.     Nose: Nose normal.     Mouth/Throat:     Pharynx: Uvula midline. No oropharyngeal exudate or posterior oropharyngeal erythema.      Comments: Patient has multiple dental caries with significant decay.  Eyes:     General:        Right eye: No discharge.        Left eye: No discharge.     Conjunctiva/sclera: Conjunctivae  normal.  Musculoskeletal:     Cervical back: Normal range of motion and neck supple. No edema, erythema, rigidity or crepitus.  Lymphadenopathy:     Cervical: No cervical adenopathy.  Neurological:     Mental Status: He is alert.  Psychiatric:        Behavior: Behavior normal.        Thought Content: Thought content normal.     ED Results / Procedures / Treatments   Labs (all labs ordered are listed, but only abnormal results are displayed) Labs Reviewed - No data to display  EKG None  Radiology No results found.  Procedures .Marland KitchenIncision and Drainage  Date/Time: 01/12/2020 7:39 PM Performed by: Cherly Anderson, PA-C Authorized by: Cherly Anderson, PA-C   Consent:    Consent obtained:  Verbal   Consent given by:  Patient   Risks discussed:  Bleeding, damage to other organs, infection, incomplete drainage and pain   Alternatives discussed:  No treatment Location:    Type:  Abscess   Location:  Mouth   Mouth location: dental. Anesthesia (see MAR for exact dosages):    Anesthesia method:  Local infiltration   Local anesthetic:  Lidocaine 1% w/o epi Procedure details:    Needle aspiration: yes     Needle size:  18 G   Drainage:  Bloody and purulent   Drainage amount:  Moderate   Packing materials:  None Post-procedure details:    Patient tolerance of procedure:  Tolerated well, no immediate complications   (including critical care time)  Medications Ordered in ED Medications  lidocaine (PF) (XYLOCAINE) 1 % injection 5 mL (5 mLs Infiltration Given 01/12/20 1851)  amoxicillin-clavulanate (AUGMENTIN) 875-125 MG per tablet 1 tablet (1 tablet Oral Given 01/12/20 1821)    ED Course  I have reviewed the triage vital signs and the nursing notes.  Pertinent labs & imaging results that were available during my care of the patient were reviewed by me and considered in my medical decision making (see chart for details).    MDM Rules/Calculators/A&P                       Patient presents with dental pain. Patient is nontoxic appearing, vitals without significant abnormality. Dental abscess present, performed I&D via aspiration as documented above, tolerated well. Exam unconcerning for Ludwig's angina or deep space infection.  Will treat with Augmentin and Naproxen.  Urged patient to follow-up with dentist, dental resources were provided.  Discussed treatment plan and need for follow up as well as return precautions. Provided opportunity for questions, patient confirmed understanding and is agreeable to plan.  Final Clinical Impression(s) / ED Diagnoses Final  diagnoses:  Dental abscess    Rx / DC Orders ED Discharge Orders         Ordered    amoxicillin-clavulanate (AUGMENTIN) 875-125 MG tablet  Every 12 hours     01/12/20 1943    naproxen (NAPROSYN) 500 MG tablet  2 times daily     01/12/20 1943           Desmond Lope 01/12/20 Wilmon Pali, MD 01/12/20 2237

## 2020-01-12 NOTE — ED Triage Notes (Signed)
Pt presents to ED with complaints of upper left and right dental pain x 1 week. Pt denies fever.

## 2020-01-12 NOTE — Discharge Instructions (Signed)
You were seen in the ER today for dental pain. You were found to have a dental abscess which was incised and drained to help with infection.  Call one of the dentists offices provided to schedule an appointment for re-evaluation and further management within the next 48 hours.   I have prescribed you Augmentin which is an antibiotic to treat the infection and Naproxen which is an anti-inflammatory medicine to treat the pain.   Please take all of your antibiotics until finished. You may develop abdominal discomfort or diarrhea from the antibiotic.  You may help offset this with probiotics which you can buy at the store (ask your pharmacist if unable to find) or get probiotics in the form of eating yogurt. Do not eat or take the probiotics until 2 hours after your antibiotic. If you are unable to tolerate these side effects follow-up with your primary care provider or return to the emergency department.   If you begin to experience any blistering, rashes, swelling, or difficulty breathing seek medical care for evaluation of potentially more serious side effects.   Be sure to eat something when taking the Naproxen as it can cause stomach upset and at worst stomach bleeding. Do not take additional non steroidal anti-inflammatory medicines such as Ibuprofen, Aleve, Advil, Mobic, Diclofenac, or goodie powder while taking Naproxen. You may supplement with Tylenol.   We have prescribed you new medication(s) today. Discuss the medications prescribed today with your pharmacist as they can have adverse effects and interactions with your other medicines including over the counter and prescribed medications. Seek medical evaluation if you start to experience new or abnormal symptoms after taking one of these medicines, seek care immediately if you start to experience difficulty breathing, feeling of your throat closing, facial swelling, or rash as these could be indications of a more serious allergic reaction  If  you start to experience and new or worsening symptoms return to the emergency department. If you start to experience fever, chills, neck stiffness/pain, or inability to move your neck or open your mouth come back to the emergency department immediately.

## 2021-10-08 ENCOUNTER — Other Ambulatory Visit: Payer: Self-pay

## 2021-10-08 ENCOUNTER — Emergency Department (HOSPITAL_COMMUNITY)
Admission: EM | Admit: 2021-10-08 | Discharge: 2021-10-08 | Disposition: A | Discharge status: Home / Self Care | Payer: Self-pay | Attending: Emergency Medicine | Admitting: Emergency Medicine

## 2021-10-08 ENCOUNTER — Emergency Department (HOSPITAL_COMMUNITY): Payer: Self-pay

## 2021-10-08 ENCOUNTER — Encounter (HOSPITAL_COMMUNITY): Payer: Self-pay | Admitting: *Deleted

## 2021-10-08 DIAGNOSIS — R072 Precordial pain: Secondary | ICD-10-CM | POA: Insufficient documentation

## 2021-10-08 DIAGNOSIS — R0789 Other chest pain: Secondary | ICD-10-CM

## 2021-10-08 DIAGNOSIS — F17210 Nicotine dependence, cigarettes, uncomplicated: Secondary | ICD-10-CM | POA: Insufficient documentation

## 2021-10-08 DIAGNOSIS — K219 Gastro-esophageal reflux disease without esophagitis: Secondary | ICD-10-CM | POA: Insufficient documentation

## 2021-10-08 DIAGNOSIS — R0602 Shortness of breath: Secondary | ICD-10-CM | POA: Insufficient documentation

## 2021-10-08 LAB — BASIC METABOLIC PANEL
Anion gap: 8 (ref 5–15)
BUN: 13 mg/dL (ref 6–20)
CO2: 24 mmol/L (ref 22–32)
Calcium: 8.7 mg/dL — ABNORMAL LOW (ref 8.9–10.3)
Chloride: 103 mmol/L (ref 98–111)
Creatinine, Ser: 0.82 mg/dL (ref 0.61–1.24)
GFR, Estimated: >60 mL/min (ref >60)
Glucose, Bld: 88 mg/dL (ref 70–99)
Potassium: 3.4 mmol/L — ABNORMAL LOW (ref 3.5–5.1)
Sodium: 135 mmol/L (ref 135–145)

## 2021-10-08 LAB — CBC
HCT: 43.4 % (ref 39.0–52.0)
Hemoglobin: 15 g/dL (ref 13.0–17.0)
MCH: 29.9 pg (ref 26.0–34.0)
MCHC: 34.6 g/dL (ref 30.0–36.0)
MCV: 86.6 fL (ref 80.0–100.0)
Platelets: 223 10*3/uL (ref 150–400)
RBC: 5.01 MIL/uL (ref 4.22–5.81)
RDW: 13.2 % (ref 11.5–15.5)
WBC: 7.2 10*3/uL (ref 4.0–10.5)
nRBC: 0 % (ref 0.0–0.2)

## 2021-10-08 LAB — TROPONIN I (HIGH SENSITIVITY): Troponin I (High Sensitivity): <2 ng/L (ref <18)

## 2021-10-08 MED ORDER — CYCLOBENZAPRINE HCL 10 MG PO TABS: 10.0000 mg | ORAL_TABLET | Freq: Two times a day (BID) | ORAL | 0 refills | Status: DC | PRN

## 2021-10-08 MED ORDER — CYCLOBENZAPRINE HCL 10 MG PO TABS
10.0000 mg | ORAL_TABLET | Freq: Once | ORAL | Status: AC
  Administered 2021-10-08: 10 mg via ORAL
  Filled 2021-10-08: qty 1

## 2021-10-08 NOTE — ED Triage Notes (Signed)
Pt with mid CP x 2 days with SOB. Denies any N/V, woke up the other morning with CP

## 2021-10-08 NOTE — Discharge Instructions (Signed)
Lab work and imaging are all reassuring, starting on a muscle relaxer please take as prescribed for muscle pain.  You also take over-the-counter pain medication as needed.  Given information helping with smoking sensation please read.  Follow-up with PCP for further evaluation.  Come back to the emergency department if you develop chest pain, shortness of breath, severe abdominal pain, uncontrolled nausea, vomiting, diarrhea.

## 2021-10-08 NOTE — ED Provider Notes (Signed)
[facility] EMERGENCY DEPARTMENT Provider Note   CSN: [id number] Arrival date & time: 10/08/21  1724     History Chief Complaint  Patient presents with   Chest Pain    Angel [name] is a 40 y.o. male.  HPI  Patient with no significant medical history presents with complaint of substernal chest pain.  Patient states chest pain started  2 days ago, came on suddenly after he woke from sleeping, states the pain remains in his chest does not radiate, states he will come and go, exertion does not make the pain any worse, nor does position/food intake.  He has no associated shortness of breath, pleuritic chest pain, becoming diaphoretic, denies nausea vomiting, lightheaded or dizziness, denies  peripheral edema, no history of PEs or DVTs, currently not on hormone therapy, he denies significant cardiac history, only  risk factor is active smoker.  Denies illicit drug use.  Patient does not endorse associated fevers, chills, nasal ingestion, sore throat, cough, general body aches, stomach pain nausea vomit diarrhea.  Past Medical History:  Diagnosis Date   Anxiety    Depression    Migraine     Patient Active Problem List   Diagnosis Date Noted   GERD (gastroesophageal reflux disease) 03/11/2018   Severe recurrent major depression without psychotic features (HCC) 03/09/2018   Major depressive disorder, recurrent episode (HCC) 05/16/2017   Tobacco use disorder 07/17/2015   Headache syndrome 07/17/2015   Nicotine dependence 07/17/2015   Generalized anxiety disorder 11/01/2013   Major depressive disorder, recurrent episode, severe, without mention of psychotic behavior 10/31/2013    History reviewed. No pertinent surgical history.     Family History  Problem Relation Age of Onset   Cancer Mother        brain   Diabetes Brother    Hypertension Brother     Social History   Tobacco Use   Smoking status: Every Day    Packs/day: 1.50    Years: 20.00    Pack years: 30.00     Types: Cigarettes   Smokeless tobacco: Never  Vaping Use   Vaping Use: Never used  Substance Use Topics   Alcohol use: No   Drug use: No    Home Medications Prior to Admission medications   Medication Sig Start Date End Date Taking? Authorizing Provider  cyclobenzaprine (FLEXERIL) 10 MG tablet Take 1 tablet (10 mg total) by mouth 2 (two) times daily as needed for muscle spasms. 10/08/21  Yes Carroll Sage, PA-C  amoxicillin-clavulanate (AUGMENTIN) 875-125 MG tablet Take 1 tablet by mouth every 12 (twelve) hours. 01/12/20   Petrucelli, Samantha R, PA-C  ARIPiprazole (ABILIFY) 5 MG tablet Take 1 tablet (5 mg total) by mouth at bedtime. 09/02/18   Oneta Rack, NP  famotidine (PEPCID) 20 MG tablet Take 1 tablet (20 mg total) by mouth 2 (two) times daily. 09/02/18   Oneta Rack, NP  FLUoxetine (PROZAC) 20 MG capsule Take 1 capsule (20 mg total) by mouth daily. 09/03/18   Oneta Rack, NP  mirtazapine (REMERON) 7.5 MG tablet Take 1 tablet (7.5 mg total) by mouth at bedtime. 09/02/18   Oneta Rack, NP  naproxen (NAPROSYN) 500 MG tablet Take 1 tablet (500 mg total) by mouth 2 (two) times daily. 01/12/20   Petrucelli, Samantha R, PA-C  nicotine (NICODERM CQ - DOSED IN MG/24 HOURS) 21 mg/24hr patch Place 1 patch (21 mg total) onto the skin daily. (May buy from over the counter): For smoking cessation  Patient not taking: Reported on 08/27/2018 03/16/18   Lindell Spar I, NP    Allergies    Patient has no known allergies.  Review of Systems   Review of Systems  Constitutional:  Negative for chills and fever.  HENT:  Negative for congestion.   Respiratory:  Negative for shortness of breath.   Cardiovascular:  Positive for chest pain. Negative for palpitations and leg swelling.  Gastrointestinal:  Negative for abdominal pain, nausea and vomiting.  Genitourinary:  Negative for enuresis.  Musculoskeletal:  Negative for back pain.  Skin:  Negative for rash.  Neurological:  Negative for  dizziness and headaches.  Hematological:  Does not bruise/bleed easily.   Physical Exam Updated Vital Signs BP 107/74   Pulse 78   Temp 98 F (36.7 C) (Oral)   Resp 13   Ht 5\' 9"  (1.753 m)   Wt 76.7 kg   SpO2 97%   BMI 24.96 kg/m   Physical Exam Vitals and nursing note reviewed.  Constitutional:      General: He is not in acute distress.    Appearance: He is not ill-appearing.  HENT:     Head: Normocephalic and atraumatic.     Nose: No congestion.  Eyes:     Conjunctiva/sclera: Conjunctivae normal.  Cardiovascular:     Rate and Rhythm: Normal rate and regular rhythm.     Pulses: Normal pulses.     Heart sounds: No murmur heard.   No friction rub. No gallop.  Pulmonary:     Effort: No respiratory distress.     Breath sounds: No wheezing, rhonchi or rales.     Comments: Chest was palpated he has tenderness to palpation along the distal aspect of the sternum, chest pain is reproducible.  No pressure deformities present. Chest:     Chest wall: Tenderness present.  Abdominal:     Palpations: Abdomen is soft.     Tenderness: There is no abdominal tenderness. There is no right CVA tenderness or left CVA tenderness.  Musculoskeletal:     Right lower leg: No edema.     Left lower leg: No edema.  Skin:    General: Skin is warm and dry.  Neurological:     Mental Status: He is alert.  Psychiatric:        Mood and Affect: Mood normal.    ED Results / Procedures / Treatments   Labs (all labs ordered are listed, but only abnormal results are displayed) Labs Reviewed  BASIC METABOLIC PANEL - Abnormal; Notable for the following components:      Result Value   Potassium 3.4 (*)    Calcium 8.7 (*)    All other components within normal limits  CBC  TROPONIN I (HIGH SENSITIVITY)    EKG EKG Interpretation  Date/Time:  Saturday October 08 2021 18:01:47 EST Ventricular Rate:  75 PR Interval:  166 QRS Duration: 104 QT Interval:  380 QTC Calculation: 425 R  Axis:   82 Text Interpretation: Sinus rhythm Baseline wander in lead(s) V3 Confirmed by Noemi Chapel [id number] on 10/08/2021 7:03:22 PM  Radiology DG Chest Portable 1 View  Result Date: 10/08/2021 CLINICAL DATA:  Chest pain and shortness of breath. EXAM: PORTABLE CHEST 1 VIEW COMPARISON:  None. FINDINGS: The cardiomediastinal contours are normal. Minimal hyperinflation. Pulmonary vasculature is normal. No consolidation, pleural effusion, or pneumothorax. No acute osseous abnormalities are seen. IMPRESSION: Minimal hyperinflation likely related smoking. No acute or localizing process. Electronically Signed   By: Aurther Loft.D.  On: 10/08/2021 18:33    Procedures Procedures   Medications Ordered in ED Medications  cyclobenzaprine (FLEXERIL) tablet 10 mg (10 mg Oral Given 10/08/21 1858)    ED Course  I have reviewed the triage vital signs and the nursing notes.  Pertinent labs & imaging results that were available during my care of the patient were reviewed by me and considered in my medical decision making (see chart for details).    MDM Rules/Calculators/A&P                          Initial impression-presents with substernal chest pain x2 days.  He is alert, does not appear acute stress, vital signs reassuring.  Will obtain chest pain work-up provide patient with muscle relaxer and reassess.  Work-up-CBC unremarkable, CBC shows slight hypokalemia 3.4, for troponin is less than 2, chest x-ray unremarkable, EKG sinus without signs of ischemia.  Reassessment-patient  reassessed states his pain has improved, has no complaints this time vital signs remained stable he is agreeable for discharge.  Rule out- I have low suspicion for ACS as history is atypical, patient has no cardiac history, EKG was sinus rhythm without signs of ischemia, for troponin is less than 2, will defer second opponent as patient been having chest pain for greater than 12 hours would expect elevation in  troponins of ACS was present and/or EKG changes.  Low suspicion for PE as patient denies pleuritic chest pain, shortness of breath, patient denies leg pain, no pedal edema noted on exam, patient was PERC negative.  Low suspicion for AAA or aortic dissection as history is atypical, patient has low risk factors.  Low suspicion for systemic infection as patient is nontoxic-appearing, vital signs reassuring, no obvious source infection noted on exam.   Plan-  Chest pain since improved-unclear etiology differential includes costochondritis, acid reflux, esophagus spasms, muscular strain.   will start patient on a muscle relaxer as it  help with his pain, likely muscular nature, follow-up PCP for further evaluation.  Given strict return precautions.  Vital signs have remained stable, no indication for hospital admission.  Patient given at home care as well strict return precautions.  Patient verbalized that they understood agreed to said plan.  Final Clinical Impression(s) / ED Diagnoses Final diagnoses:  Atypical chest pain    Rx / DC Orders ED Discharge Orders          Ordered    cyclobenzaprine (FLEXERIL) 10 MG tablet  2 times daily PRN        10/08/21 1921             Aron Baba 10/08/21 Geanie Berlin, MD 10/09/21 1500

## 2022-07-02 IMAGING — DX DG CHEST 1V PORT
1 series · 1 of 1 positions shown · non-contrast
Comparison: None.

CLINICAL DATA: Chest pain and shortness of breath.

EXAM:
PORTABLE CHEST 1 VIEW

[chest ap]
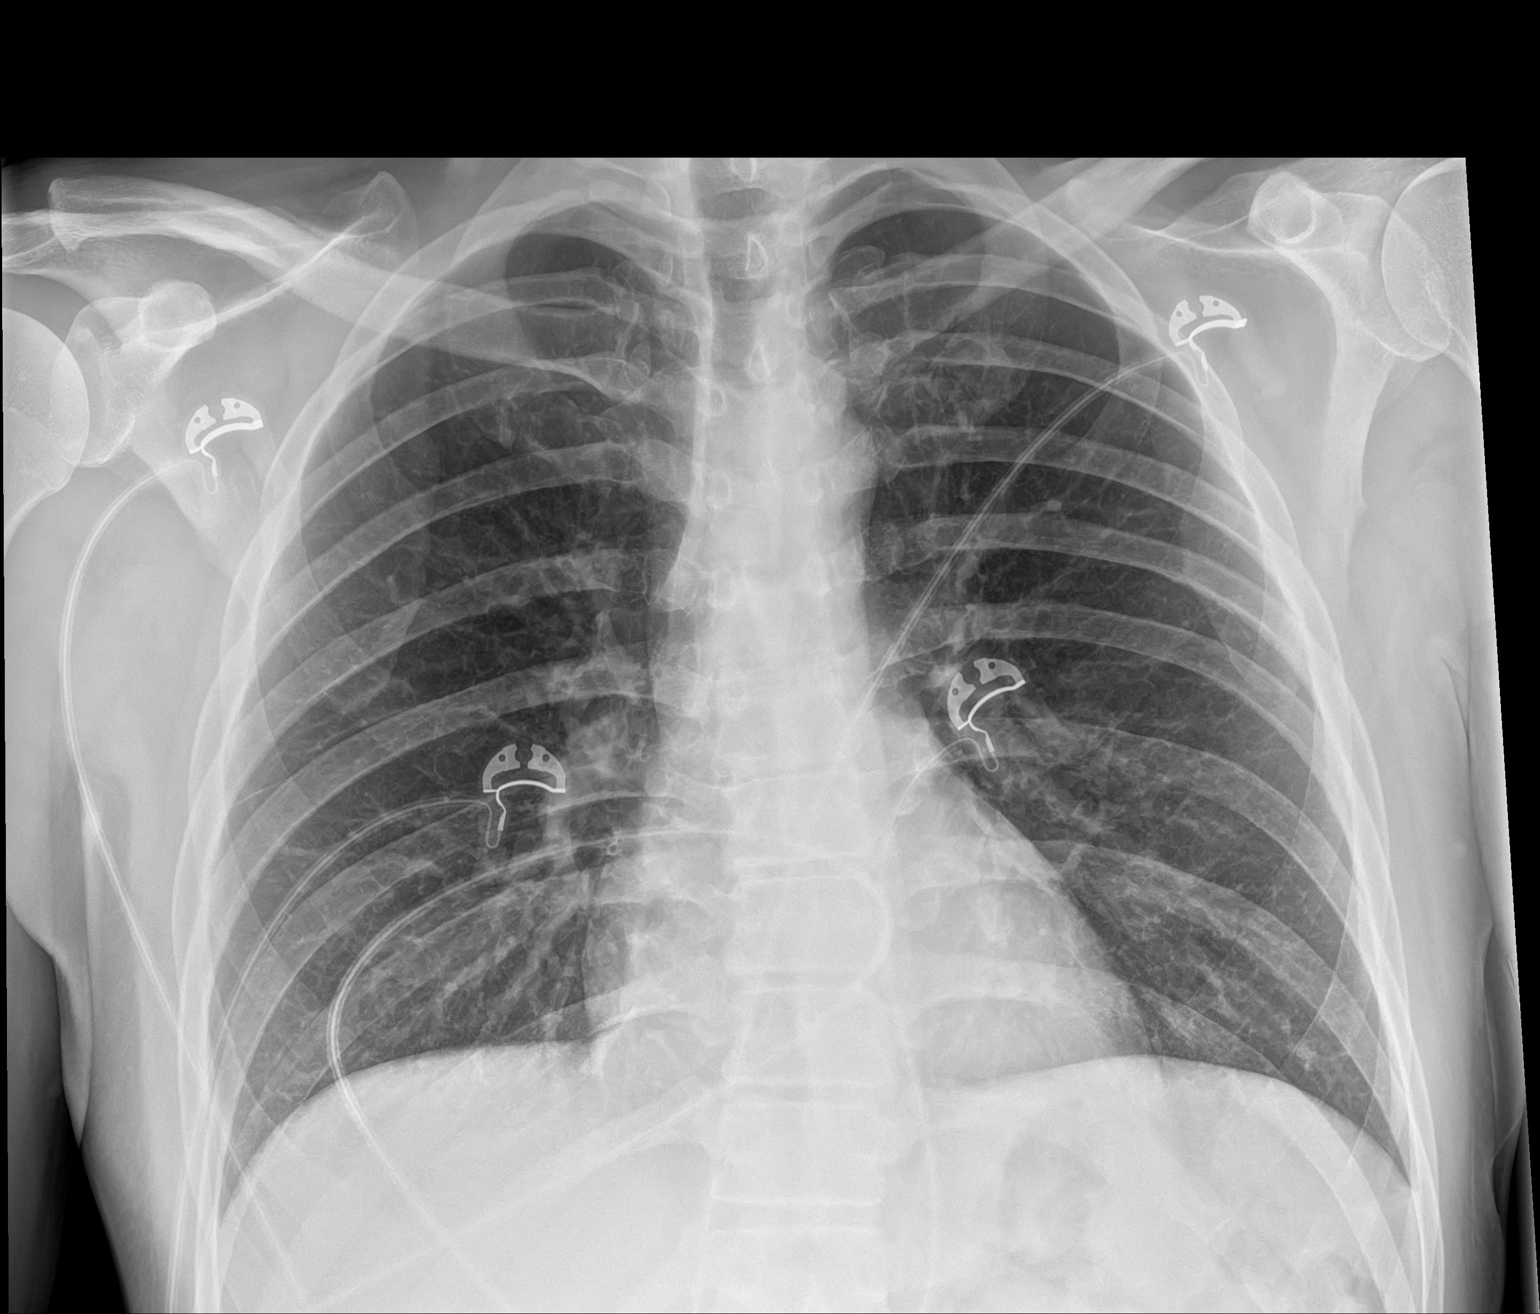

[1 of 1 positions shown; findings below may reference images not displayed]

FINDINGS: The cardiomediastinal contours are normal. Minimal hyperinflation.
Pulmonary vasculature is normal. No consolidation, pleural effusion,
or pneumothorax. No acute osseous abnormalities are seen.
IMPRESSION: Minimal hyperinflation likely related smoking. No acute or
localizing process.

## 2023-04-27 ENCOUNTER — Other Ambulatory Visit: Payer: Self-pay

## 2023-04-27 ENCOUNTER — Encounter (HOSPITAL_COMMUNITY): Payer: Self-pay | Admitting: Emergency Medicine

## 2023-04-27 ENCOUNTER — Emergency Department (HOSPITAL_COMMUNITY)
Admission: EM | Admit: 2023-04-27 | Discharge: 2023-04-28 | Disposition: A | Discharge status: Other Acute Inpatient Hospital | Payer: Medicaid Other | Source: Home / Self Care | Attending: Emergency Medicine | Admitting: Emergency Medicine

## 2023-04-27 DIAGNOSIS — F332 Major depressive disorder, recurrent severe without psychotic features: Secondary | ICD-10-CM | POA: Insufficient documentation

## 2023-04-27 DIAGNOSIS — Z79899 Other long term (current) drug therapy: Secondary | ICD-10-CM | POA: Insufficient documentation

## 2023-04-27 DIAGNOSIS — F411 Generalized anxiety disorder: Secondary | ICD-10-CM | POA: Insufficient documentation

## 2023-04-27 DIAGNOSIS — X789XXA Intentional self-harm by unspecified sharp object, initial encounter: Secondary | ICD-10-CM | POA: Insufficient documentation

## 2023-04-27 DIAGNOSIS — S51811A Laceration without foreign body of right forearm, initial encounter: Secondary | ICD-10-CM | POA: Insufficient documentation

## 2023-04-27 DIAGNOSIS — R4589 Other symptoms and signs involving emotional state: Secondary | ICD-10-CM

## 2023-04-27 DIAGNOSIS — S51812A Laceration without foreign body of left forearm, initial encounter: Secondary | ICD-10-CM | POA: Insufficient documentation

## 2023-04-27 DIAGNOSIS — R45851 Suicidal ideations: Secondary | ICD-10-CM

## 2023-04-27 LAB — COMPREHENSIVE METABOLIC PANEL
ALT: 19 U/L (ref 0–44)
AST: 21 U/L (ref 15–41)
Albumin: 4.7 g/dL (ref 3.5–5.0)
Alkaline Phosphatase: 65 U/L (ref 38–126)
Anion gap: 11 (ref 5–15)
BUN: 10 mg/dL (ref 6–20)
CO2: 25 mmol/L (ref 22–32)
Calcium: 9.3 mg/dL (ref 8.9–10.3)
Chloride: 99 mmol/L (ref 98–111)
Creatinine, Ser: 0.87 mg/dL (ref 0.61–1.24)
GFR, Estimated: >60 mL/min (ref >60)
Glucose, Bld: 79 mg/dL (ref 70–99)
Potassium: 3.7 mmol/L (ref 3.5–5.1)
Sodium: 135 mmol/L (ref 135–145)
Total Bilirubin: 0.9 mg/dL (ref 0.3–1.2)
Total Protein: 7.8 g/dL (ref 6.5–8.1)

## 2023-04-27 LAB — RAPID URINE DRUG SCREEN, HOSP PERFORMED
Amphetamines: NOT DETECTED
Barbiturates: NOT DETECTED
Benzodiazepines: NOT DETECTED
Cocaine: NOT DETECTED
Opiates: NOT DETECTED
Tetrahydrocannabinol: POSITIVE — AB

## 2023-04-27 LAB — CBC
HCT: 47.5 % (ref 39.0–52.0)
Hemoglobin: 16.1 g/dL (ref 13.0–17.0)
MCH: 29.8 pg (ref 26.0–34.0)
MCHC: 33.9 g/dL (ref 30.0–36.0)
MCV: 87.8 fL (ref 80.0–100.0)
Platelets: 226 10*3/uL (ref 150–400)
RBC: 5.41 MIL/uL (ref 4.22–5.81)
RDW: 13.5 % (ref 11.5–15.5)
WBC: 7.1 10*3/uL (ref 4.0–10.5)
nRBC: 0 % (ref 0.0–0.2)

## 2023-04-27 LAB — ETHANOL: Alcohol, Ethyl (B): <10 mg/dL (ref <10)

## 2023-04-27 LAB — SALICYLATE LEVEL: Salicylate Lvl: <7 mg/dL — ABNORMAL LOW (ref 7.0–30.0)

## 2023-04-27 LAB — ACETAMINOPHEN LEVEL: Acetaminophen (Tylenol), Serum: <10 ug/mL — ABNORMAL LOW (ref 10–30)

## 2023-04-27 MED ORDER — NICOTINE 21 MG/24HR TD PT24
21.0000 mg | MEDICATED_PATCH | Freq: Once | TRANSDERMAL | Status: DC
  Administered 2023-04-27: 21 mg via TRANSDERMAL
  Filled 2023-04-27: qty 1

## 2023-04-27 NOTE — ED Notes (Signed)
Pt being TTS at this time  

## 2023-04-27 NOTE — ED Notes (Signed)
Security wanded pt ?

## 2023-04-27 NOTE — ED Provider Notes (Cosign Needed Addendum)
[place] EMERGENCY DEPARTMENT AT [facility] Provider Note   CSN: [id number] Arrival date & time: 04/27/23  1035     History  Chief Complaint  Patient presents with   Suicidal    Angel [name] is a 42 y.o. male.  HPI     Angel [name] is a 42 y.o. male with past medical history of depressive disorder, generalized anxiety, migraine headaches who presents to the Emergency Department requesting assistance for suicidal thoughts.  States for the last 2 or 3 weeks he has had recurring thoughts of killing himself.  States he has a plan to hang himself if "I did not get some help."  States that "life is making me feel this way."  Notes that his brother committed suicide.  Patient states that he used a razor blade to make cuts to both forearms yesterday and today.  States that he has had suicidal thoughts in the past.  He admits to recent marijuana use, he denies alcohol, cocaine, or other illicit drugs.  Also denies any homicidal thoughts, auditory or visual hallucinations.  Home Medications Prior to Admission medications   Medication Sig Start Date End Date Taking? Authorizing Provider  amoxicillin-clavulanate (AUGMENTIN) 875-125 MG tablet Take 1 tablet by mouth every 12 (twelve) hours. 01/12/20   Petrucelli, Samantha R, PA-C  ARIPiprazole (ABILIFY) 5 MG tablet Take 1 tablet (5 mg total) by mouth at bedtime. 09/02/18   Oneta Rack, NP  cyclobenzaprine (FLEXERIL) 10 MG tablet Take 1 tablet (10 mg total) by mouth 2 (two) times daily as needed for muscle spasms. 10/08/21   Carroll Sage, PA-C  famotidine (PEPCID) 20 MG tablet Take 1 tablet (20 mg total) by mouth 2 (two) times daily. 09/02/18   Oneta Rack, NP  FLUoxetine (PROZAC) 20 MG capsule Take 1 capsule (20 mg total) by mouth daily. 09/03/18   Oneta Rack, NP  mirtazapine (REMERON) 7.5 MG tablet Take 1 tablet (7.5 mg total) by mouth at bedtime. 09/02/18   Oneta Rack, NP  naproxen (NAPROSYN) 500 MG tablet  Take 1 tablet (500 mg total) by mouth 2 (two) times daily. 01/12/20   Petrucelli, Samantha R, PA-C  nicotine (NICODERM CQ - DOSED IN MG/24 HOURS) 21 mg/24hr patch Place 1 patch (21 mg total) onto the skin daily. (May buy from over the counter): For smoking cessation Patient not taking: Reported on 08/27/2018 03/16/18   Armandina Stammer I, NP      Allergies    Patient has no known allergies.    Review of Systems   Review of Systems  Constitutional:  Negative for chills and fever.  Respiratory:  Negative for cough and shortness of breath.   Cardiovascular:  Negative for chest pain.  Gastrointestinal:  Negative for abdominal pain, nausea and vomiting.  Musculoskeletal:  Negative for arthralgias and back pain.  Skin:  Negative for rash and wound.  Neurological:  Negative for dizziness, weakness, numbness and headaches.  Psychiatric/Behavioral:  Positive for self-injury and suicidal ideas. Negative for confusion and hallucinations. The patient is not nervous/anxious.     Physical Exam Updated Vital Signs BP 108/75 (BP Location: Right Arm)   Pulse 82   Temp 98.9 F (37.2 C) (Oral)   Resp 16   Ht 5\' 8"  (1.727 m)   Wt 67.2 kg   SpO2 99%   BMI 22.52 kg/m  Physical Exam Vitals and nursing note reviewed.  Constitutional:      General: He is not in acute  distress.    Appearance: Normal appearance. He is not ill-appearing or toxic-appearing.  Cardiovascular:     Rate and Rhythm: Normal rate and regular rhythm.     Pulses: Normal pulses.  Pulmonary:     Effort: Pulmonary effort is normal. No respiratory distress.  Abdominal:     Palpations: Abdomen is soft.     Tenderness: There is no abdominal tenderness.  Musculoskeletal:        General: Normal range of motion.  Skin:    General: Skin is warm.     Capillary Refill: Capillary refill takes less than 2 seconds.     Comments: Patient has multiple linear scratches and superficial lacerations to bilateral forearms this.  No active bleeding.   Neurological:     General: No focal deficit present.     Mental Status: He is alert.     Sensory: No sensory deficit.     Motor: No weakness.  Psychiatric:        Attention and Perception: He does not perceive auditory or visual hallucinations.        Mood and Affect: Mood is depressed. Affect is tearful.        Speech: Speech normal.        Behavior: Behavior is cooperative.        Thought Content: Thought content is not paranoid. Thought content includes suicidal ideation. Thought content does not include homicidal ideation. Thought content includes suicidal plan. Thought content does not include homicidal plan.     Comments: Patient tearful, depressed mood.  Suicidal thoughts and plan.  Does not appear to be interacting with internal stimuli.     ED Results / Procedures / Treatments   Labs (all labs ordered are listed, but only abnormal results are displayed) Labs Reviewed  SALICYLATE LEVEL - Abnormal; Notable for the following components:      Result Value   Salicylate Lvl <7.0 (*)    All other components within normal limits  ACETAMINOPHEN LEVEL - Abnormal; Notable for the following components:   Acetaminophen (Tylenol), Serum <10 (*)    All other components within normal limits  RAPID URINE DRUG SCREEN, HOSP PERFORMED - Abnormal; Notable for the following components:   Tetrahydrocannabinol POSITIVE (*)    All other components within normal limits  COMPREHENSIVE METABOLIC PANEL  ETHANOL  CBC    EKG None  Radiology No results found.  Procedures Procedures    Medications Ordered in ED Medications - No data to display  ED Course/ Medical Decision Making/ A&P                             Medical Decision Making Patient here with thoughts of harming himself.  States that he has a plan to hang himself if he does not get help.  Has self-inflicted superficial lacerations to both wrists and forearms.  Admits to using a razor blade.  Denies any specific stressors,  states "life has made him feel this way."  History of suicidal ideation in the past.  Patient cooperative at this time, has specific plan with self-inflicted wounds.  Plan to medically clear and TTS consult.  Amount and/or Complexity of Data Reviewed Labs: ordered.    Details: Labs interpreted by me, no evidence of leukocytosis, hemoglobin unremarkable.  Chemistries without derangement.  Salicylate and acetaminophen levels reassuring.  Blood alcohol less than 10.  UDS positive for THC Discussion of management or test interpretation with external provider(s):  1312 patient medically cleared at this time.  He is cooperative and voluntary.  Plan to consult TTS  Patient evaluated by TTS, inpatient admission recommended.  Patient remains calm, cooperative  Will require IVC if threatens to leave.  Discussed this with Hilton Cork, PA-C  Risk OTC drugs.    inal Clinical Impression(s) / ED Diagnoses Final diagnoses:  Suicidal ideation  At risk for self harm    Rx / DC Orders ED Discharge Orders     None         Pauline Aus, PA-C 04/27/23 1644    Pauline Aus, PA-C 04/27/23 1646    Pauline Aus, PA-C 04/27/23 1849    Margarita Grizzle, MD 05/01/23 1241

## 2023-04-27 NOTE — ED Notes (Signed)
Pt dressed out in appropriate psych scrubs

## 2023-04-27 NOTE — ED Notes (Signed)
Pt belongings placed in locker one while he was upfront being triaged

## 2023-04-27 NOTE — BH Assessment (Signed)
Comprehensive Clinical Assessment (CCA) Note  04/27/2023 Angel [name] [id number] DISPOSITIONFreida Busman NP recommends an inpatient admission to assist with stabilization.   The patient demonstrates the following risk factors for suicide: Chronic risk factors for suicide include: previous self-harm in 2019 . Acute risk factors for suicide include: unemployment. Protective factors for this patient include: coping skills. Considering these factors, the overall suicide risk at this point appears to be high. Patient is not appropriate for outpatient follow up.   Patient is a 42 year old amel that presents this date voluntary to [facility] with ongoing S/I. This Clinical research associate observed several superficial lacerations to his left anterior forearm that he reports he self inflicted with a razor earlier this date in an attempt to end his life. Patient denies any H/I or AVH. Patient states that he recently found out that the house that he has living in was sold and he has to be out by 6/4. Patient states he has, "no where to go and just doesn't want to go on." Patient reports previous attempts to self harm back in 2019. Patient reports he had been diagnosed with depression and anxiety in 2016 and per chart review had presented in 2019 to [facility] with similar symptoms requiring an inpatient admission at that time. Patient denies having a current mental health OP provider or being prescribed any medications since he was discharged in 2019. Patient states his symptoms associated with his MDD/GAD, "are always there," but have worsened in the last 2 weeks reporting frequent bouts of crying, feeling hopeless and worthlessness. Patient states he is currently unemployed and is single with no children and has limited support in the area. Patient renders a conflicting history in reference to SA issues reporting he uses cannabis on arrival although denies any SA history at the time of assessment. Patient's UDS is positive for cannabis this date.  Patient denies any current legal issues or access to firearms.   Patient is alert and oriented x 5. Patient speaks in a normal voice with clear tone and volume. Patient's memory is intact with thoughts organized. Patient's mood is depressed with affect congruent. Patient does not appear to be responding to internal stimuli.            Chief Complaint:  Chief Complaint  Patient presents with   Suicidal   Visit Diagnosis: MDD recurrent without psychotic features, severe, GAD     CCA Screening, Triage and Referral (STR)  Patient Reported Information How did you hear about [place]? Self  What Is the Reason for Your Visit/Call Today? Angel [name] is a 42 y.o. male with past medical history of depressive disorder, generalized anxiety, migraine headaches who presents to the Emergency Department requesting assistance for suicidal thoughts.  States for the last 2 or 3 weeks he has had recurring thoughts of killing himself.  States he has a plan to hang himself if "I did not get some help."  States that "life is making me feel this way."  Notes that his brother committed suicide.  Patient states that he used a razor blade to make cuts to both forearms yesterday and today.  States that he has had suicidal thoughts in the past.  He admits to recent marijuana use, he denies alcohol, cocaine, or other illicit drugs.  Also denies any homicidal thoughts, auditory or visual hallucinations.  How Long Has This Been Causing You Problems? 1 wk - 1 month  What Do You Feel Would Help You the Most Today? Treatment for Depression  or other mood problem   Have You Recently Had Any Thoughts About Hurting Yourself? Yes  Are You Planning to Commit Suicide/Harm Yourself At This time? Yes   Flowsheet Row ED from 04/27/2023 in [facility] Emergency Department at [facility] ED from 10/08/2021 in [facility] Emergency Department at [facility]mission (Discharged[facility] AD[facility] 400B  C-SSRS RISK CATEGORY High Risk No Risk High Risk       Have you Recently Had Thoughts About Hurting Someone Angel [name]? No  Are You Planning to Harm Someone at This Time? No  Explanation: NA   Have You Used Any Alcohol or Drugs in the Past 24 Hours? No  What Did You Use and How Much? Patient renders conflicting hx at the time of the assessment denies any SA use although reported THC use when first seen by ED provider   Do You Currently Have a Therapist/Psychiatrist? No  Name of Therapist/Psychiatrist: Name of Therapist/Psychiatrist: NA   Have You Been Recently Discharged From Any Office Practice or Programs? No  Explanation of Discharge From Practice/Program: NA     CCA Screening Triage Referral Assessment Type of Contact: Tele-Assessment  Telemedicine Service Delivery: Telemedicine service delivery: This service was provided via telemedicine using a 2-way, interactive audio and video technology  Is this Initial or Reassessment? Is this Initial or Reassessment?: Initial Assessment  Date Telepsych consult ordered in [facility]:  Date Telepsych consult ordered in [facility]: 04/27/23  Time Telepsych consult ordered in [facility]:  Time Telepsych consult ordered in [facility]: 1300  Location of Assessment: [facility]  Provider Location: [facility] Assessment Services   Collateral Involvement: None at this time   Does Patient Have a Automotive engineer Guardian? No  Legal Guardian Contact Information: NA  Copy of Legal Guardianship Form: -- (NA)  Legal Guardian Notified of Arrival: -- (NA)  Legal Guardian Notified of Pending Discharge: -- (NA)  If Minor and Not Living with Parent(s), Who has Custody? NA  Is CPS involved or ever been involved? Never  Is APS involved or ever been involved? Never   Patient Determined To Be At Risk for Harm To Self or Others Based on Review of Patient Reported Information or Presenting Complaint? Yes, for Self-Harm  Method: Plan with intent  and identified person  Availability of Means: In hand or used  Intent: Clearly intends on inflicting harm that could cause death  Notification Required: No need or identified person  Additional Information for Danger to Others Potential: Previous attempts  Additional Comments for Danger to Others Potential: None noted  Are There Guns or Other Weapons in Your Home? No  Types of Guns/Weapons: NA  Are These Weapons Safely Secured?                            -- (NA)  Who Could Verify You Are Able To Have These Secured: NA  Do You Have any Outstanding Charges, Pending Court Dates, Parole/Probation? Denies  Contacted To Inform of Risk of Harm To Self or Others: Other: Comment (NA)    Does Patient Present under Involuntary Commitment? No    [place] of Residence: [place]   Patient Currently Receiving the Following Services: Not Receiving Services   Determination of Need: Urgent (48 hours)   Options For Referral: Inpatient Hospitalization     CCA Biopsychosocial Patient Reported Schizophrenia/Schizoaffective Diagnosis in Past: No   Strengths: Patient is willing to participate  in treatment and is open to interventions   Mental Health Symptoms Depression:   Change in energy/activity; Difficulty Concentrating; Hopelessness   Duration of Depressive symptoms:  Duration of Depressive Symptoms: Greater than two weeks   Mania:   None   Anxiety:    Irritability; Restlessness; Difficulty concentrating   Psychosis:   None   Duration of Psychotic symptoms:    Trauma:   None   Obsessions:   None   Compulsions:   None   Inattention:   None   Hyperactivity/Impulsivity:   None   Oppositional/Defiant Behaviors:   None   Emotional Irregularity:   Chronic feelings of emptiness   Other Mood/Personality Symptoms:   None noted    Mental Status Exam Appearance and self-care  Stature:   Average   Weight:   Average weight   Clothing:    Disheveled   Grooming:   Neglected   Cosmetic use:   None   Posture/gait:   Normal   Motor activity:   Not Remarkable   Sensorium  Attention:   Normal   Concentration:   Normal   Orientation:   X5   Recall/memory:   Normal   Affect and Mood  Affect:   Anxious; Depressed   Mood:   Depressed   Relating  Eye contact:   Normal   Facial expression:   Depressed   Attitude toward examiner:   Cooperative   Thought and Language  Speech flow:  Clear and Coherent   Thought content:   Appropriate to Mood and Circumstances   Preoccupation:   None   Hallucinations:   None   Organization:   Intact; Goal-directed   Company secretary of Knowledge:   Fair   Intelligence:   Average   Abstraction:   Normal   Judgement:   Poor   Reality Testing:   Realistic   Insight:   Poor   Decision Making:   Normal   Social Functioning  Social Maturity:   Responsible   Social Judgement:   Normal   Stress  Stressors:   Housing   Coping Ability:   Exhausted   Skill Deficits:   Decision making; Self-control   Supports:   Support needed     Religion: Religion/Spirituality Are You A Religious Person?: No How Might This Affect Treatment?: NA  Leisure/Recreation: Leisure / Recreation Do You Have Hobbies?: No  Exercise/Diet: Exercise/Diet Do You Exercise?: No Have You Gained or Lost A Significant Amount of Weight in the Past Six Months?: No Do You Follow a Special Diet?: No Do You Have Any Trouble Sleeping?: No   CCA Employment/Education Employment/Work Situation: Employment / Work Situation Employment Situation: Unemployed Patient's Job has Been Impacted by Current Illness: No Has Patient ever Been in Equities trader?: No  Education: Education Is Patient Currently Attending School?: No Last Grade Completed: 12 Did You Product manager?: No Did You Have An Individualized Education Program (IIEP): No Did You Have Any  Difficulty At [employer]?: No Patient's Education Has Been Impacted by Current Illness: No   CCA Family/Childhood History Family and Relationship History: Family history Marital status: Single Does patient have children?: No  Childhood History:  Childhood History By whom was/is the patient raised?: Both parents Did patient suffer any verbal/emotional/physical/sexual abuse as a child?: No Did patient suffer from severe childhood neglect?: No Has patient ever been sexually abused/assaulted/raped as an adolescent or adult?: No Was the patient ever a victim of a crime or a disaster?: No Witnessed domestic  violence?: No Has patient been affected by domestic violence as an adult?: No       CCA Substance Use Alcohol/Drug Use: Alcohol / Drug Use Pain Medications: See MAR Prescriptions: See MAR Over the Counter: See MAR History of alcohol / drug use?: Yes (Per chart review although pt renders conflicting hx stating on arrival he uses cannabis although dernies at the time of assessment) Longest period of sobriety (when/how long): Pt denies any hx of use although reported THC use on arrival although denies any SA hx at the time of assessment. Negative Consequences of Use:  (NA) Withdrawal Symptoms: None                         ASAM's:  Six Dimensions of Multidimensional Assessment  Dimension 1:  Acute Intoxication and/or Withdrawal Potential:   Dimension 1:  Description of individual's past and current experiences of substance use and withdrawal:  (NA)  Dimension 2:  Biomedical Conditions and Complications:   Dimension 2:  Description of patient's biomedical conditions and  complications:  (NA)  Dimension 3:  Emotional, Behavioral, or Cognitive Conditions and Complications:  Dimension 3:  Description of emotional, behavioral, or cognitive conditions and complications:  (NA)  Dimension 4:  Readiness to Change:     Dimension 5:  Relapse, Continued use, or Continued Problem  Potential:  Dimension 5:  Relapse, continued use, or continued problem potential critiera description:  (NA)  Dimension 6:  Recovery/Living Environment:  Dimension 6:  Recovery/Iiving environment criteria description:  (NA)  ASAM Severity Score:    ASAM Recommended Level of Treatment: ASAM Recommended Level of Treatment:  (NA)   Substance use Disorder (SUD) Substance Use Disorder (SUD)  Checklist Symptoms of Substance Use:  (NA)  Recommendations for Services/Supports/Treatments: Recommendations for Services/Supports/Treatments Recommendations For Services/Supports/Treatments:  (NA)  Discharge Disposition:    DSM5 Diagnoses: Patient Active Problem List   Diagnosis Date Noted   GERD (gastroesophageal reflux disease) 03/11/2018   Severe recurrent major depression without psychotic features (HCC) 03/09/2018   Major depressive disorder, recurrent episode (HCC) 05/16/2017   Tobacco use disorder 07/17/2015   Headache syndrome 07/17/2015   Nicotine dependence 07/17/2015   Generalized anxiety disorder 11/01/2013   Major depressive disorder, recurrent episode, severe, 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

## 2023-04-27 NOTE — Progress Notes (Signed)
Pt meets inpatient behavioral health placement per Doran Heater, NP. This Disposition CSW requested [facility] Fransico Ludwig, RN to review.   Maryjean Ka, MSW, [facility] 04/27/2023 9:58 PM

## 2023-04-27 NOTE — ED Triage Notes (Addendum)
Pt c/o SI x 2 weeks with a plan to hang himself, stating "just life" has caused him to feel this way. Prior hx SI, SA, depression, anxiety. Pt has superficial lacerations noted to both anterior forearms, self-inflicted. Bleeding is controlled at this time.

## 2023-04-27 NOTE — ED Notes (Signed)
Security called to wand pt  

## 2023-04-28 ENCOUNTER — Encounter (HOSPITAL_COMMUNITY): Payer: Self-pay | Admitting: Psychiatry

## 2023-04-28 ENCOUNTER — Inpatient Hospital Stay (HOSPITAL_COMMUNITY)
Admit: 2023-04-28 | Discharge: 2023-05-03 | DRG: 885 | Disposition: A | Discharge status: Home / Self Care | Payer: Medicaid Other | Source: Ambulatory Visit | Attending: Psychiatry | Admitting: Psychiatry

## 2023-04-28 ENCOUNTER — Other Ambulatory Visit: Payer: Self-pay

## 2023-04-28 DIAGNOSIS — K5900 Constipation, unspecified: Secondary | ICD-10-CM | POA: Diagnosis present

## 2023-04-28 DIAGNOSIS — F411 Generalized anxiety disorder: Secondary | ICD-10-CM | POA: Diagnosis present

## 2023-04-28 DIAGNOSIS — R45851 Suicidal ideations: Secondary | ICD-10-CM | POA: Diagnosis present

## 2023-04-28 DIAGNOSIS — Z79899 Other long term (current) drug therapy: Secondary | ICD-10-CM | POA: Diagnosis not present

## 2023-04-28 DIAGNOSIS — K219 Gastro-esophageal reflux disease without esophagitis: Secondary | ICD-10-CM | POA: Diagnosis present

## 2023-04-28 DIAGNOSIS — Z818 Family history of other mental and behavioral disorders: Secondary | ICD-10-CM | POA: Diagnosis not present

## 2023-04-28 DIAGNOSIS — F332 Major depressive disorder, recurrent severe without psychotic features: Secondary | ICD-10-CM | POA: Diagnosis present

## 2023-04-28 DIAGNOSIS — Z5900 Homelessness unspecified: Secondary | ICD-10-CM

## 2023-04-28 DIAGNOSIS — Z91199 Patient's noncompliance with other medical treatment and regimen due to unspecified reason: Secondary | ICD-10-CM

## 2023-04-28 DIAGNOSIS — Z5982 Transportation insecurity: Secondary | ICD-10-CM | POA: Diagnosis not present

## 2023-04-28 DIAGNOSIS — F17210 Nicotine dependence, cigarettes, uncomplicated: Secondary | ICD-10-CM | POA: Diagnosis present

## 2023-04-28 DIAGNOSIS — Z9152 Personal history of nonsuicidal self-harm: Secondary | ICD-10-CM

## 2023-04-28 DIAGNOSIS — Z5941 Food insecurity: Secondary | ICD-10-CM

## 2023-04-28 DIAGNOSIS — G4700 Insomnia, unspecified: Secondary | ICD-10-CM | POA: Diagnosis present

## 2023-04-28 DIAGNOSIS — Z23 Encounter for immunization: Secondary | ICD-10-CM

## 2023-04-28 DIAGNOSIS — F17200 Nicotine dependence, unspecified, uncomplicated: Secondary | ICD-10-CM | POA: Diagnosis present

## 2023-04-28 MED ORDER — LORAZEPAM 1 MG PO TABS: 2.0000 mg | ORAL_TABLET | Freq: Three times a day (TID) | ORAL | Status: DC | PRN

## 2023-04-28 MED ORDER — MIRTAZAPINE 7.5 MG PO TABS
7.5000 mg | ORAL_TABLET | Freq: Every day | ORAL | Status: DC
  Administered 2023-04-28 – 2023-05-02 (×5): 7.5 mg via ORAL
  Filled 2023-04-28 (×7): qty 1

## 2023-04-28 MED ORDER — NICOTINE 14 MG/24HR TD PT24
14.0000 mg | MEDICATED_PATCH | Freq: Every day | TRANSDERMAL | Status: DC
  Administered 2023-04-28 – 2023-05-03 (×6): 14 mg via TRANSDERMAL
  Filled 2023-04-28 (×8): qty 1

## 2023-04-28 MED ORDER — HALOPERIDOL 5 MG PO TABS: 5.0000 mg | ORAL_TABLET | Freq: Three times a day (TID) | ORAL | Status: DC | PRN

## 2023-04-28 MED ORDER — FLUOXETINE HCL 20 MG PO CAPS
20.0000 mg | ORAL_CAPSULE | Freq: Every day | ORAL | Status: DC
  Administered 2023-04-28 – 2023-04-30 (×3): 20 mg via ORAL
  Filled 2023-04-28 (×7): qty 1

## 2023-04-28 MED ORDER — HALOPERIDOL LACTATE 5 MG/ML IJ SOLN: 5.0000 mg | Freq: Three times a day (TID) | INTRAMUSCULAR | Status: DC | PRN

## 2023-04-28 MED ORDER — DIPHENHYDRAMINE HCL 50 MG/ML IJ SOLN: 50.0000 mg | Freq: Three times a day (TID) | INTRAMUSCULAR | Status: DC | PRN

## 2023-04-28 MED ORDER — ALUM & MAG HYDROXIDE-SIMETH 200-200-20 MG/5ML PO SUSP: 30.0000 mL | ORAL | Status: DC | PRN

## 2023-04-28 MED ORDER — PNEUMOCOCCAL VAC POLYVALENT 25 MCG/0.5ML IJ INJ
0.5000 mL | INJECTION | INTRAMUSCULAR | Status: DC
  Filled 2023-04-28: qty 0.5

## 2023-04-28 MED ORDER — MAGNESIUM HYDROXIDE 400 MG/5ML PO SUSP: 30.0000 mL | Freq: Every day | ORAL | Status: DC | PRN

## 2023-04-28 MED ORDER — PANTOPRAZOLE SODIUM 20 MG PO TBEC
20.0000 mg | DELAYED_RELEASE_TABLET | Freq: Every day | ORAL | Status: DC
  Administered 2023-04-29 – 2023-05-03 (×5): 20 mg via ORAL
  Filled 2023-04-28 (×6): qty 1

## 2023-04-28 MED ORDER — ACETAMINOPHEN 325 MG PO TABS: 650.0000 mg | ORAL_TABLET | Freq: Four times a day (QID) | ORAL | Status: DC | PRN

## 2023-04-28 MED ORDER — LORAZEPAM 2 MG/ML IJ SOLN: 2.0000 mg | Freq: Three times a day (TID) | INTRAMUSCULAR | Status: DC | PRN

## 2023-04-28 MED ORDER — ARIPIPRAZOLE 5 MG PO TABS
5.0000 mg | ORAL_TABLET | Freq: Every day | ORAL | Status: DC
  Administered 2023-04-28 – 2023-05-01 (×4): 5 mg via ORAL
  Filled 2023-04-28 (×8): qty 1

## 2023-04-28 MED ORDER — HYDROXYZINE HCL 25 MG PO TABS
25.0000 mg | ORAL_TABLET | Freq: Three times a day (TID) | ORAL | Status: DC | PRN
  Administered 2023-04-28 – 2023-05-02 (×4): 25 mg via ORAL
  Filled 2023-04-28 (×5): qty 1

## 2023-04-28 MED ORDER — TRAZODONE HCL 50 MG PO TABS
50.0000 mg | ORAL_TABLET | Freq: Every day | ORAL | Status: DC
  Administered 2023-04-28 – 2023-05-01 (×4): 50 mg via ORAL
  Filled 2023-04-28 (×6): qty 1

## 2023-04-28 MED ORDER — DIPHENHYDRAMINE HCL 25 MG PO CAPS: 50.0000 mg | ORAL_CAPSULE | Freq: Three times a day (TID) | ORAL | Status: DC | PRN

## 2023-04-28 NOTE — Progress Notes (Signed)
   04/28/23 0304  Psych Admission Type (Psych Patients Only)  Admission Status Voluntary  Psychosocial Assessment  Patient Complaints Depression;Anxiety  Eye Contact Fair  Facial Expression Animated  Affect Appropriate to circumstance  Speech Logical/coherent  Interaction Minimal  Motor Activity Other (Comment) (WDL)  Appearance/Hygiene Disheveled;Body odor;Poor hygiene;In scrubs  Behavior Characteristics Appropriate to situation  Mood Depressed;Pleasant  Thought Process  Coherency WDL  Content WDL  Delusions None reported or observed  Perception WDL  Hallucination None reported or observed  Judgment Poor  Confusion None  Danger to Self  Current suicidal ideation? Passive  Description of Suicide Plan denies  Self-Injurious Behavior Some self-injurious ideation observed or expressed.  No lethal plan expressed   Agreement Not to Harm Self Yes  Description of Agreement verbal  Danger to Others  Danger to Others None reported or observed

## 2023-04-28 NOTE — Tx Team (Signed)
Initial Treatment Plan 04/28/2023 3:11 AM Rosalie Doctor ZOX:[id number]    PATIENT STRESSORS: Financial difficulties   Loss of housing   Medication change or noncompliance     PATIENT STRENGTHS: Active sense of humor  Average or above average intelligence  Communication skills  Physical Health    PATIENT IDENTIFIED PROBLEMS: Depression  Suicidal ideation        "Stop hurting myself"           DISCHARGE CRITERIA:  Adequate post-discharge living arrangements Improved stabilization in mood, thinking, and/or behavior Motivation to continue treatment in a less acute level of care Need for constant or close observation no longer present Verbal commitment to aftercare and medication compliance  PRELIMINARY DISCHARGE PLAN: Outpatient therapy Placement in alternative living arrangements  PATIENT/FAMILY INVOLVEMENT: This treatment plan has been presented to and reviewed with the patient, Angel [name].  The patient and family have been given the opportunity to ask questions and make suggestions.  Juliann Pares, RN 04/28/2023, 3:11 AM

## 2023-04-28 NOTE — H&P (Signed)
Psychiatric Admission Assessment Adult  Patient Identification: Angel [name] MRN:  [id number] Date of Evaluation:  04/28/2023 Chief Complaint:  MDD (major depressive disorder), recurrent severe, without psychosis (HCC) [F33.2] Principal Diagnosis: MDD (major depressive disorder), recurrent severe, without psychosis (HCC) Diagnosis:  Principal Problem:   MDD (major depressive disorder), recurrent severe, without psychosis (HCC) Generalized anxiety disorder  CC:  "I  wanted to kill myself because of 'things in life', like homelessness, lack of food, and no family support."  History of Present Illness: Angel [name]. [name] "Angel [name]" is a 42 year old Caucasian male with prior psychiatric history significant for major depressive disorder recurrent severe without psychotic features, generalized anxiety disorder and tobacco use disorder.  Patient presents voluntarily to [facility] from [facility] ED after stabilization for worsening depression resulting in suicidal ideations with episodes of multiple superficial self mutilation on both forearms and plan to hang self in the context of homelessness, lack of food and no family support.  During this evaluation Keric reported the following:  " My life is making me feel this way.  I have no family to support me, I am homeless and sometimes go several days without food.  I cut myself with a razor blade thinking I will bleed to death on 05-21-2023.  I have been having suicidal ideation with intention to kill myself in the past 2 weeks.  I do used drugs or alcohol, however I smoked marijuana about 2 blunts per week. I do not have any intention to harm anyone else but myself.  I am not paranoid, delusional and I do not see or hear things.  Since I did not bleed on 05-21-2023, I had to call the [facility] police to take me to the hospital to receive some help."  Patient reports several past psychiatric inpatient hospitalization to [facility],  behavioral health in [facility] and outpatient therapy at [facility] and other places he does not remember names.  He reports his current stressors to include homelessness, hunger, and poor support from family.  He reports he recently found out that the house he was living in was sold and he has to be out by May 01, 2023 and no where else to go apart from homelessness and he is overwhelmed and tired and wants to end his life.  Report his symptoms of depression have worsened over the last 2 weeks and they include episodes of crying, feeling of hopelessness and worthlessness, anhedonia, poor appetite, and anxiety.  Assessment: Patient is seen and examined in his room sitting up in his bed.  Presentation is bizarre and disheveled.  He is alert, oriented to person, place, time, and situation.  Speech is clear with normal volume and pattern.  Memory is intact and thought content/thought process coherent and organized.  He presents with depressed mood and congruent affect.  He denies SI, HI, or AVH.  He further denies paranoia or delusional thinking.  He did not appear to be responding to internal stimuli.  Encouraged to take a shower with change of clothes/brushing teeth tonight for better hygiene.  Vital signs reviewed within normal limits.  Labs reviewed without critical values.  UDS positive for marijuana.  Patient is admitted for safety, medication management, and mood stabilization.  Mode of transport to Hospital:Safe Transport Current Outpatient (Home) Medic ation List: None PRN medication prior to evaluation:None  ED course:  Labs obtained and analysed, no evidence of leukocytosis, hemoglobin unremarkable.  Chemistries without derangement.  Salicylate and acetaminophen levels reassuring.  Blood alcohol less than 10.  UDS positive for THC  Collateral Information: Not obtained at this time POA/Legal Guardian: Pt is his own legal guardian  Past Psychiatric Hx: Previous Psych Diagnoses: MDD, anxiety,  generalized anxiety disorder. Prior inpatient treatment: Admitted to behavioral health in 08/2018, for suicidal attempt, 03/09/2018 admitted to behavioral health for suicidal ideations, and 05/16/2017 admitted to behavioral health for suicidal ideation, 10/31/2013 admitted to behavioral health for suicidal ideation.  Reported being admitted to behavioral health in [place] but does not remember the year. Current/prior outpatient treatment: Admitted to [facility] in 2021. Prior rehab hx: Denies Psychotherapy hx: Yes History of suicide: 6 or 7 times History of homicide or aggression: Denies Psychiatric medication history: Yes, patient had trials of fluoxetine, Remeron, Abilify, for treatment of depression Psychiatric medication compliance history: Noncompliant due to financial difficulty Neuromodulation history: Denies  Current Psychiatrist: Denies Current therapist: Denies  Substance Abuse Hx: Alcohol: Denies alcohol Tobacco: Smokes 2 packs of cigarettes per day Illicit drugs:  2 blunts of marijuana a week Rx drug abuse: Denies Rehab hx: Denies  Past Medical History: Medical Diagnoses: GERD Home Rx: Yes Prior Hosp: Denies Prior Surgeries/Trauma: Denies  Head trauma, LOC, concussions, seizures: Denies Allergies: No known drug allergies LMP: Not applicable Contraception: Not applicable PCP: Denies  Family History: Medical: Heart disease, high blood pressure Psych: Deceased 2 aunts have history of depression Psych Rx: Yes SA/HA: Mom attempted suicide, Substance use family hx: Denies  Social History: Childhood (bring, raised, lives now, parents, siblings, schooling, education): Dropped out of 10 grade Abuse: Denies history of abuse Marital Status: Single Sexual orientation: Male from birth Children: No children Employment: Unemployed Peer Group: Denies Housing: Homelessness Finances: Water quality scientist: Denies Scientist, physiological: Denied by serving in the  [employer]  Associated Signs/Symptoms: Depression Symptoms:  depressed mood, anhedonia, insomnia, fatigue, feelings of worthlessness/guilt, difficulty concentrating, hopelessness, recurrent thoughts of death, anxiety, panic attacks, loss of energy/fatigue, disturbed sleep, decreased labido, decreased appetite,  (Hypo) Manic Symptoms:  Distractibility, Impulsivity, Irritable Mood,  Anxiety Symptoms:  Excessive Worry, Panic Symptoms, Social Anxiety,  Psychotic Symptoms:   Not applicable  PTSD Symptoms: NA  Total Time spent with patient: 45 minutes  Past Psychiatric History: See above  Is the patient at risk to self? Yes.    Has the patient been a risk to self in the past 6 months? Yes.    Has the patient been a risk to self within the distant past? Yes.     Is the patient a risk to others? No.  Has the patient been a risk to others in the past 6 months? No.  Has the patient been a risk to others within the distant past? No.   [place] Scale:  Flowsheet Row [facility]) [facility] from [facility]/27/2023 in [facility] Department [facility] ED from 10/08/2021 in [facility] Department at [facility]  C-SSRS RISK CATEGORY High Risk High Risk No Risk        Prior Inpatient Therapy: Yes.   If yes, describe: Multiple inpatient psychiatric treatment for suicidal ideation Prior Outpatient Therapy: Yes.   If yes, describe: [facility] for mental illness  Alcohol Screening: 1. How often do you have a drink containing alcohol?: Never 2. How many drinks containing alcohol do you have on a typical day when you are drinking?: 1 or 2 3. How often do you have six or more drinks on one occasion?: Never  AUDIT-C Score: 0 4. How often during the last year have you found that you were not able to stop drinking once you had started?: Never 5. How often during the last year have you failed to do  what was normally expected from you because of drinking?: Never 6. How often during the last year have you needed a first drink in the morning to get yourself going after a heavy drinking session?: Never 7. How often during the last year have you had a feeling of guilt of remorse after drinking?: Never 8. How often during the last year have you been unable to remember what happened the night before because you had been drinking?: Never 9. Have you or someone else been injured as a result of your drinking?: No 10. Has a relative or friend or a doctor or another health worker been concerned about your drinking or suggested you cut down?: No Alcohol Use Disorder Identification Test Final Score (AUDIT): 0 Alcohol Brief Interventions/Follow-up: Alcohol education/Brief advice  Substance Abuse History in the last 12 months:  No.  Consequences of Substance Abuse: NA Previous Psychotropic Medications: Yes   Psychological Evaluations: Yes   Past Medical History:  Past Medical History:  Diagnosis Date   Anxiety    Depression    Migraine    History reviewed. No pertinent surgical history. Family History:  Family History  Problem Relation Age of Onset   Cancer Mother        brain   Diabetes Brother    Hypertension Brother    Family Psychiatric  History: See above  Tobacco Screening:  Social History   Tobacco Use  Smoking Status Every Day   Packs/day: 1.50   Years: 20.00   Additional pack years: 0.00   Total pack years: 30.00   Types: Cigarettes  Smokeless Tobacco Never    BH Tobacco Counseling     Are you interested in Tobacco Cessation Medications?  Yes, implement Nicotene Replacement Protocol Counseled patient on smoking cessation:  Yes Reason Tobacco Screening Not Completed: No value filed.    Social History:  Social History   Substance and Sexual Activity  Alcohol Use No     Social History   Substance and Sexual Activity  Drug Use Yes   Types: Marijuana     Additional Social History:   Allergies:  No Known Allergies Lab Results:  Results for orders placed or performed during the hospital encounter of 04/27/23 (from the past 48 hour(s))  Rapid urine drug screen (hospital performed)     Status: Abnormal   Collection Time: 04/27/23 11:50 AM  Result Value Ref Range   Opiates NONE DETECTED NONE DETECTED   Cocaine NONE DETECTED NONE DETECTED   Benzodiazepines NONE DETECTED NONE DETECTED   Amphetamines NONE DETECTED NONE DETECTED   Tetrahydrocannabinol POSITIVE (A) NONE DETECTED   Barbiturates NONE DETECTED NONE DETECTED    Comment: (NOTE) DRUG SCREEN FOR MEDICAL PURPOSES ONLY.  IF CONFIRMATION IS NEEDED FOR ANY PURPOSE, NOTIFY LAB WITHIN 5 DAYS.  LOWEST DETECTABLE LIMITS FOR URINE DRUG SCREEN Drug Class                     Cutoff (ng/mL) Amphetamine and metabolites    1000 Barbiturate and metabolites    200 Benzodiazepine                 200 Opiates and metabolites        300 Cocaine and metabolites  300 THC                            50 Performed at [facility], [address], [place], [place] [address]   Comprehensive metabolic panel     Status: None   Collection Time: 04/27/23 11:58 AM  Result Value Ref Range   Sodium 135 135 - 145 mmol/L   Potassium 3.7 3.5 - 5.1 mmol/L   Chloride 99 98 - 111 mmol/L   CO2 25 22 - 32 mmol/L   Glucose, Bld 79 70 - 99 mg/dL    Comment: Glucose reference range applies only to samples taken after fasting for at least 8 hours.   BUN 10 6 - 20 mg/dL   Creatinine, Ser 6.04 0.61 - 1.24 mg/dL   Calcium 9.3 8.9 - 54.0 mg/dL   Total Protein 7.8 6.5 - 8.1 g/dL   Albumin 4.7 3.5 - 5.0 g/dL   AST 21 15 - 41 U/L   ALT 19 0 - 44 U/L   Alkaline Phosphatase 65 38 - 126 U/L   Total Bilirubin 0.9 0.3 - 1.2 mg/dL   GFR, Estimated >98 >11 mL/min    Comment: (NOTE) Calculated using the CKD-EPI Creatinine Equation (2021)    Anion gap 11 5 - 15    Comment: Performed at [facility], [address], [place], [place] [address]  Ethanol     Status: None   Collection Time: 04/27/23 11:58 AM  Result Value Ref Range   Alcohol, Ethyl (B) <10 <10 mg/dL    Comment: (NOTE) Lowest detectable limit for serum alcohol is 10 mg/dL.  For medical purposes only. Performed at [facility], [address] [place], [place] [address]   Salicylate level     Status: Abnormal   Collection Time: 04/27/23 11:58 AM  Result Value Ref Range   Salicylate Lvl <7.0 (L) 7.0 - 30.0 mg/dL    Comment: Performed at [facility], [address] [place], [place] [address]  Acetaminophen level     Status: Abnormal   Collection Time: 04/27/23 11:58 AM  Result Value Ref Range   Acetaminophen (Tylenol), Serum <10 (L) 10 - 30 ug/mL    Comment: (NOTE) Therapeutic concentrations vary significantly. A range of 10-30 ug/mL  may be an effective concentration for many patients. However, some  are best treated at concentrations outside of this range. Acetaminophen concentrations >150 ug/mL at 4 hours after ingestion  and >50 ug/mL at 12 hours after ingestion are often associated with  toxic reactions.  Performed at [facility], [address], [place], [place] [address]   cbc     Status: None   Collection Time: 04/27/23 11:58 AM  Result Value Ref Range   WBC 7.1 4.0 - 10.5 K/uL   RBC 5.41 4.22 - 5.81 MIL/uL   Hemoglobin 16.1 13.0 - 17.0 g/dL   HCT 96.2 95.2 - 84.1 %   MCV 87.8 80.0 - 100.0 fL   MCH 29.8 26.0 - 34.0 pg   MCHC 33.9 30.0 - 36.0 g/dL   RDW 32.4 40.1 - 02.7 %   Platelets 226 150 - 400 K/uL   nRBC 0.0 0.0 - 0.2 %    Comment: Performed at [facility], [address] [place], [place] [address]   Blood Alcohol level:  Lab Results  Component Value Date   [facility] <10 04/27/2023   ETH <10 08/27/2018   Metabolic Disorder Labs:  Lab Results  Component Value Date   HGBA1C 5.3 08/28/2018   MPG 105 08/28/2018   MPG 105 05/17/2017   Lab Results  Component Value Date   PROLACTIN 6.7 08/28/2018   PROLACTIN  17.3 (H) 05/17/2017   Lab Results  Component Value Date   CHOL 171 08/28/2018   TRIG 107 08/28/2018   HDL 46 08/28/2018   CHOLHDL 3.7 08/28/2018   VLDL 21 08/28/2018   LDLCALC 104 (H) 08/28/2018   LDLCALC 128 (H) 05/17/2017    Current Medications: Current Facility-Administered Medications  Medication Dose Route Frequency Provider Last Rate Last Admin   acetaminophen (TYLENOL) tablet 650 mg  650 mg Oral Q6H PRN Ajibola, Ene A, NP       alum & mag hydroxide-simeth (MAALOX/MYLANTA) 200-200-20 MG/5ML suspension 30 mL  30 mL Oral Q4H PRN Ajibola, Ene A, NP       diphenhydrAMINE (BENADRYL) capsule 50 mg  50 mg Oral TID PRN Ajibola, Ene A, NP       Or   diphenhydrAMINE (BENADRYL) injection 50 mg  50 mg Intramuscular TID PRN Ajibola, Ene A, NP       haloperidol (HALDOL) tablet 5 mg  5 mg Oral TID PRN Ajibola, Ene A, NP       Or   haloperidol lactate (HALDOL) injection 5 mg  5 mg Intramuscular TID PRN Ajibola, Ene A, NP       hydrOXYzine (ATARAX) tablet 25 mg  25 mg Oral TID PRN Ajibola, Ene A, NP       LORazepam (ATIVAN) tablet 2 mg  2 mg Oral TID PRN Ajibola, Ene A, NP       Or   LORazepam (ATIVAN) injection 2 mg  2 mg Intramuscular TID PRN Ajibola, Ene A, NP       magnesium hydroxide (MILK OF MAGNESIA) suspension 30 mL  30 mL Oral Daily PRN Ajibola, Ene A, NP       nicotine (NICODERM CQ - dosed in mg/24 hours) patch 14 mg  14 mg Transdermal Daily Bobbitt, Shalon E, NP   14 mg at 04/28/23 1001   [START ON 04/29/2023] pneumococcal 23 valent vaccine (PNEUMOVAX-23) injection 0.5 mL  0.5 mL Intramuscular Tomorrow-1000 Bobbitt, Shalon E, NP       PTA Medications: No medications prior to admission.   Musculoskeletal: Strength & Muscle Tone: within normal limits Gait & Station: normal  Psychiatric Specialty Exam:  Presentation  General Appearance:  Bizarre; Disheveled  Eye Contact: Fair  Speech: Clear and Coherent  Speech Volume: Normal  Handedness: Right   Mood and Affect   Mood: Dysphoric; Hopeless  Affect: Congruent   Thought Process  Thought Processes: Coherent; Linear  Duration of Psychotic Symptoms:N/A  Past Diagnosis of Schizophrenia or Psychoactive disorder: No  Descriptions of Associations:Intact  Orientation:Full (Time, Place and Person)  Thought Content:Rumination; WDL  Hallucinations:Hallucinations: None  Ideas of Reference:None  Suicidal Thoughts:Suicidal Thoughts: No  Homicidal Thoughts:Homicidal Thoughts: No  Sensorium  Memory: Immediate Fair; Recent Fair  Judgment: Poor  Insight: Shallow  Executive Functions  Concentration: Fair  Attention Span: Fair  Recall: Fair  Fund of Knowledge: Fair  Language: Good  Psychomotor Activity  Psychomotor Activity: Psychomotor Activity: Normal  Assets  Assets: Communication Skills; Desire for Improvement; Physical Health; Resilience  Sleep  Sleep: Sleep: Poor Number of Hours of Sleep: 2  Physical Exam: Physical Exam Vitals and nursing note reviewed.  HENT:     Head: Normocephalic.     Nose: Nose normal.     Mouth/Throat:  Mouth: Mucous membranes are moist.  Eyes:     Pupils: Pupils are equal, round, and reactive to light.  Cardiovascular:     Rate and Rhythm: Normal rate.     Pulses: Normal pulses.  Pulmonary:     Effort: Pulmonary effort is normal.  Abdominal:     Comments: deferred  Genitourinary:    Comments: deferred Musculoskeletal:        General: Normal range of motion.     Cervical back: Normal range of motion.  Skin:    General: Skin is warm.  Neurological:     General: No focal deficit present.     Mental Status: He is alert and oriented to person, place, and time.  Psychiatric:        Behavior: Behavior normal.    Review of Systems  Constitutional:  Negative for fever.  HENT:  Negative for sore throat.   Eyes: Negative.   Respiratory:  Negative for shortness of breath.   Gastrointestinal:  Positive for heartburn  (History of GERD).  Genitourinary: Negative.   Musculoskeletal: Negative.   Endo/Heme/Allergies:        See allergist listing  Psychiatric/Behavioral:  Positive for depression. The patient is nervous/anxious and has insomnia.    Blood pressure 102/83, pulse 89, temperature 98.4 F (36.9 C), temperature source Oral, resp. rate 20, height 5\' 8"  (1.727 m), weight 67.2 kg, SpO2 100 %. Body mass index is 22.51 kg/m.  Treatment Plan Summary: Daily contact with patient to assess and evaluate symptoms and progress in treatment and Medication management  Observation Level/Precautions:  15 minute checks  Laboratory:  CBC Chemistry Profile HbAIC UDS UA  Psychotherapy: Therapeutic milieu  Medications: See MAR  Consultations: Social work  Discharge Concerns: Safety  Estimated LOS: 5 to 7 days  Other:  n/a   Physician Treatment Plan for Primary Diagnosis:  Assessment MDD (major depressive disorder), recurrent severe, without psychosis (HCC)  Plan: Medications: Initiate Fluoxetine 20 mg p.o. daily for depression Initiate Abilify 5 mg p.o. daily for mood stabilization Initiate Remeron 7.5 mg p.o. q. nightly for depression and sleep Initiate Hydroxyzine 25 mg p.o. 3 times daily as needed for anxiety Initiate Trazodone 50 mg p.o. nightly as needed for insomnia  Other medical issues: Nicotine NicoDerm CQ-dose in milligrams/24 hours patch 14 mg transdermal over 24 hours daily for smoking cessation Protonix 20 mg p.o. daily for GERD  Agitation protocol: Benadryl capsule 50 mg p.o. 3 times daily as needed agitation or Benadryl injection 50 mg IM 3 times daily as needed agitation   Haldol tablets 5 mg po 3 times daily as needed agitation or Haldol lactate injection 5 mg IM 3 times daily as needed agitation   Lorazepam tablet 2 mg p.o. 3 times daily as needed agitation or Lorazepam injection 2 mg IM 3 times daily as needed agitation   Other PRN Medications -Acetaminophen 650 mg every 6  as needed/mild pain -Maalox 30 mL oral every 4 as needed/digestion -Magnesium hydroxide 30 mL daily as needed/mild constipation  -- The risks/benefits/side-effects/alternatives to this medication were discussed in detail with the patient and time was given for questions. The patient consents to medication trial.              -- Encouraged patient to participate  in unit milieu and in scheduled group therapies    Safety and Monitoring: Voluntary admission to inpatient psychiatric unit for safety, stabilization and treatment Daily contact with patient to assess and evaluate symptoms and progress in treatment  Patient's case to be discussed in multi-disciplinary team meeting Observation Level : q15 minute checks Vital signs: q12 hours Precautions: suicide, but pt currently verbally contracts for safety on unit    Discharge Planning: Social work and case management to assist with discharge planning and identification of hospital follow-up needs prior to discharge Estimated LOS: 5-7 days Discharge Concerns: Need to establish a safety plan; Medication compliance and effectiveness Discharge Goals: Return home with outpatient referrals for mental health follow-up including medication management/psychotherapy.  Long Term Goal(s): Improvement in symptoms so as ready for discharge  Short Term Goals: Ability to identify changes in lifestyle to reduce recurrence of condition will improve, Ability to verbalize feelings will improve, Ability to disclose and discuss suicidal ideas, Ability to demonstrate self-control will improve, Ability to identify and develop effective coping behaviors will improve, Ability to maintain clinical measurements within normal limits will improve, Compliance with prescribed medications will improve, and Ability to identify triggers associated with substance abuse/mental health issues will improve  Physician Treatment Plan for Secondary Diagnosis: Principal Problem:   MDD  (major depressive disorder), recurrent severe, without psychosis (HCC)  I certify that inpatient services furnished can reasonably be expected to improve the patient's condition.    Cecilie Lowers, FNP 6/1/202411:00 AM

## 2023-04-28 NOTE — [facility] Group Notes (Signed)
[facility] Group Notes:  (Nursing/MHT/Case Management/Adjunct)  Date:  04/28/2023  Time:  2000  Type of Therapy:   wrap up group  Participation Level:  Minimal  Participation Quality:  Attentive  Affect:  Depressed and Flat  Cognitive:  Lacking  Insight:  Lacking  Engagement in Group:  Developing/Improving  Modes of Intervention:  Clarification, Education, and Socialization  Summary of Progress/Problems: Positive thinking and self-care were discussed.   Marcille Buffy 04/28/2023, 9:11 PM

## 2023-04-28 NOTE — Progress Notes (Signed)
Patient is accepted to [facility]  Room/Bed  305-1  Accepting Doran Heater, NP  Attending [place]  Call report to [phone]  Patient to arrive at  ASAP  **Please fax signed vol consent to [phone] "prior" to contacting transport. Thanks

## 2023-04-28 NOTE — [facility] Suicide Risk Assessment (Signed)
Suicide Risk Assessment  Admission Assessment    [facility] Admission Suicide Risk Assessment   Nursing information obtained from:  Patient Demographic factors:  Male, Caucasian, Unemployed, Living alone, Low socioeconomic status Current Mental Status:  Suicidal ideation indicated by patient, Suicidal ideation indicated by others, Self-harm thoughts, Self-harm behaviors Loss Factors:  Financial problems / change in socioeconomic status (lost housing) Historical Factors:  Prior suicide attempts, Impulsivity Risk Reduction Factors:  NA  Total Time spent with patient: 30 minutes Principal Problem: MDD (major depressive disorder), recurrent severe, without psychosis (HCC) Diagnosis:  Principal Problem:   MDD (major depressive disorder), recurrent severe, without psychosis (HCC)  Subjective Data: Angel [name]. Angel [name] "[name]" is a 42 year old Caucasian male with prior psychiatric history significant for major depressive disorder recurrent severe without psychotic features, generalized anxiety disorder and tobacco use disorder.  Patient presents voluntarily to [facility] from [facility] ED after stabilization for worsening depression resulting in suicidal ideations with episodes of multiple superficial self mutilation on both forearms and plan to hang self in the context of homelessness, lack of food and no family support.   Continued Clinical Symptoms:  Alcohol Use Disorder Identification Test Final Score (AUDIT): 0 The "Alcohol Use Disorders Identification Test", Guidelines for Use in Primary Care, Second Edition.  World Science writer [facility]). Score between 0-7:  no or low risk or alcohol related problems. Score between 8-15:  moderate risk of alcohol related problems. Score between 16-19:  high risk of alcohol related problems. Score 20 or above:  warrants further diagnostic evaluation for alcohol dependence and treatment.  CLINICAL FACTORS:   Severe Anxiety  and/or Agitation Panic Attacks Depression:   Anhedonia Hopelessness Impulsivity Insomnia Severe Alcohol/Substance Abuse/Dependencies More than one psychiatric diagnosis Unstable or Poor Therapeutic Relationship Previous Psychiatric Diagnoses and Treatments Medical Diagnoses and Treatments/Surgeries  Musculoskeletal: Strength & Muscle Tone: within normal limits Gait & Station: normal Patient leans: N/A  Psychiatric Specialty Exam:  Presentation  General Appearance:  Bizarre; Disheveled  Eye Contact: Fair  Speech: Clear and Coherent  Speech Volume: Normal  Handedness: Right  Mood and Affect  Mood: Dysphoric; Hopeless  Affect: Congruent  Thought Process  Thought Processes: Coherent; Linear  Descriptions of Associations:Intact  Orientation:Full (Time, Place and Person)  Thought Content:Rumination; WDL  History of Schizophrenia/Schizoaffective disorder:No  Duration of Psychotic Symptoms:No data recorded Hallucinations:Hallucinations: None  Ideas of Reference:None  Suicidal Thoughts:Suicidal Thoughts: No  Homicidal Thoughts:Homicidal Thoughts: No  Sensorium  Memory: Immediate Fair; Recent Fair  Judgment: Poor  Insight: Shallow  Executive Functions  Concentration: Fair  Attention Span: Fair  Recall: Fair  Fund of Knowledge: Fair  Language: Good  Psychomotor Activity  Psychomotor Activity: Psychomotor Activity: Normal  Assets  Assets: Communication Skills; Desire for Improvement; Physical Health; Resilience  Sleep  Sleep: Sleep: Poor Number of Hours of Sleep: 2  Physical Exam: Physical Exam Review of Systems  Constitutional:  Negative for fever.  HENT:  Negative for sore throat.   Eyes:  Negative for blurred vision.  Respiratory:  Negative for shortness of breath.   Cardiovascular: Negative.   Gastrointestinal:  Negative for nausea and vomiting.  Genitourinary: Negative.   Musculoskeletal: Negative.   Skin:  Negative.   Neurological: Negative.   Endo/Heme/Allergies:        See allergy listing  Psychiatric/Behavioral:  Positive for depression and substance abuse. The patient is nervous/anxious and has insomnia.    Blood pressure 102/83, pulse 89, temperature 98.4 F (36.9 C), temperature source Oral, resp. rate  20, height 5\' 8"  (1.727 m), weight 67.2 kg, SpO2 100 %. Body mass index is 22.51 kg/m.   COGNITIVE FEATURES THAT CONTRIBUTE TO RISK:  Polarized thinking    SUICIDE RISK:   Severe:  Frequent, intense, and enduring suicidal ideation, specific plan, no subjective intent, but some objective markers of intent (i.e., choice of lethal method), the method is accessible, some limited preparatory behavior, evidence of impaired self-control, severe dysphoria/symptomatology, multiple risk factors present, and few if any protective factors, particularly a lack of social support.  PLAN OF CARE: Treatment Plan Summary: Daily contact with patient to assess and evaluate symptoms and progress in treatment and Medication management   Observation Level/Precautions:  15 minute checks  Laboratory:  CBC Chemistry Profile HbAIC UDS UA  Psychotherapy: Therapeutic milieu  Medications: See MAR  Consultations: Social work  Discharge Concerns: Safety  Estimated LOS: 5 to 7 days  Other:  n/a    Physician Treatment Plan for Primary Diagnosis:  Assessment MDD (major depressive disorder), recurrent severe, without psychosis (HCC) Generalized anxiety disorder  Plan: Medications: Initiate Fluoxetine 20 mg p.o. daily for depression Initiate Abilify 5 mg p.o. daily for mood stabilization Initiate Remeron 7.5 mg p.o. q. nightly for depression and sleep Initiate Hydroxyzine 25 mg p.o. 3 times daily as needed for anxiety Initiate Trazodone 50 mg p.o. nightly as needed for insomnia   Other medical issues: Nicotine NicoDerm CQ-dose in milligrams/24 hours patch 14 mg transdermal over 24 hours daily for smoking  cessation Protonix 20 mg p.o. daily for GERD   Agitation protocol: Benadryl capsule 50 mg p.o. 3 times daily as needed agitation or Benadryl injection 50 mg IM 3 times daily as needed agitation   Haldol tablets 5 mg po 3 times daily as needed agitation or Haldol lactate injection 5 mg IM 3 times daily as needed agitation   Lorazepam tablet 2 mg p.o. 3 times daily as needed agitation or Lorazepam injection 2 mg IM 3 times daily as needed agitation   Other PRN Medications -Acetaminophen 650 mg every 6 as needed/mild pain -Maalox 30 mL oral every 4 as needed/digestion -Magnesium hydroxide 30 mL daily as needed/mild constipation   -- The risks/benefits/side-effects/alternatives to this medication were discussed in detail with the patient and time was given for questions. The patient consents to medication trial.              -- Encouraged patient to participate  in unit milieu and in scheduled group therapies    Safety and Monitoring: Voluntary admission to inpatient psychiatric unit for safety, stabilization and treatment Daily contact with patient to assess and evaluate symptoms and progress in treatment Patient's case to be discussed in multi-disciplinary team meeting Observation Level : q15 minute checks Vital signs: q12 hours Precautions: suicide, but pt currently verbally contracts for safety on unit    Discharge Planning: Social work and case management to assist with discharge planning and identification of hospital follow-up needs prior to discharge Estimated LOS: 5-7 days Discharge Concerns: Need to establish a safety plan; Medication compliance and effectiveness Discharge Goals: Return home with outpatient referrals for mental health follow-up including medication management/psychotherapy.   Long Term Goal(s): Improvement in symptoms so as ready for discharge   Short Term Goals: Ability to identify changes in lifestyle to reduce recurrence of condition will improve,  Ability to verbalize feelings will improve, Ability to disclose and discuss suicidal ideas, Ability to demonstrate self-control will improve, Ability to identify and develop effective coping behaviors will improve, Ability  to maintain clinical measurements within normal limits will improve, Compliance with prescribed medications will improve, and Ability to identify triggers associated with substance abuse/mental health issues will improve   Physician Treatment Plan for Secondary Diagnosis: Principal Problem:   MDD (major depressive disorder), recurrent severe, without psychosis (HCC)   I certify that inpatient services furnished can reasonably be expected to improve the patient's condition.   Cecilie Lowers, FNP 04/28/2023, 10:53 AM

## 2023-04-28 NOTE — ED Notes (Signed)
Pt sent by safe transport to [facility]

## 2023-04-28 NOTE — Group Note (Signed)
LCSW Group Therapy Note  04/28/2023   10:30-11:30am   Topic:  Anger and its Underlying Emotions  Participation Level:  Did Not Attend  Description of Group:   In this group, patients identified the last situation that provoked them to anger.   We then talked about the Anger Iceberg and the way many underlying feelings lead to feelings of anger.  Focus was placed on how helpful it is to recognize the underlying emotions to anger in order to address these for more permanent resolution.  Because the group was supposed to be on social wellness, CSW continually made the connection between our ability to recognize our feelings and our ability to convey our emotional needs to those around [place] in a more helpful manner.    Therapeutic Goals: Patients will share emotions that commonly incite their anger and how they typically respond Patients will identify how their coping skills work for them and/or against them Patients will explore possible alternative thoughts to their automatic ones Patients will learn that anger itself is normal and that healthier reactions can assist with resolving conflict rather than worsening situations  Summary of Patient Progress:  Patient was invited to group, did not attend. He did arrive for the last 60 seconds or so of group.  Therapeutic Modalities:   Cognitive Behavioral Therapy Processing  Lynnell Chad, MSW, LCSW

## 2023-04-28 NOTE — Progress Notes (Addendum)
D. Pt presents very depressed, endorsing feelings of hopelessness- has been calm, cooperative, but isolative during the day. Pt  did not attend groups despite encouragement from staff.  Pt currently denies SI/HI and AVH  A. Labs and vitals monitored.  Pt supported emotionally and encouraged to express concerns and ask questions.   R. Pt remains safe with 15 minute checks. Will continue POC.   04/28/23 1000  Psych Admission Type (Psych Patients Only)  Admission Status Voluntary  Psychosocial Assessment  Patient Complaints Depression  Eye Contact Fair  Facial Expression Sad  Affect Appropriate to circumstance  Speech Logical/coherent  Interaction Assertive  Motor Activity Other (Comment) (level 3 observation)  Appearance/Hygiene Disheveled  Behavior Characteristics Cooperative;Appropriate to situation  Mood Depressed;Pleasant  Thought Process  Coherency WDL  Content WDL  Delusions None reported or observed  Perception WDL  Hallucination None reported or observed  Judgment Poor  Confusion None  Danger to Self  Current suicidal ideation? Passive  Description of Suicide Plan no plan  Self-Injurious Behavior No self-injurious ideation or behavior indicators observed or expressed   Agreement Not to Harm Self Yes  Description of Agreement verbal contract for safety  Danger to Others  Danger to Others None reported or observed

## 2023-04-28 NOTE — Progress Notes (Signed)
   04/28/23 2000  Psych Admission Type (Psych Patients Only)  Admission Status Voluntary  Psychosocial Assessment  Patient Complaints Depression;Worrying;Sadness  Eye Contact Fair  Facial Expression Sad  Affect Appropriate to circumstance  Speech Logical/coherent  Interaction Assertive  Motor Activity Slow  Appearance/Hygiene Disheveled;Poor hygiene;Body odor  Behavior Characteristics Cooperative;Appropriate to situation  Mood Depressed;Pleasant  Thought Process  Coherency WDL  Content WDL  Delusions None reported or observed  Perception WDL  Hallucination None reported or observed  Judgment Poor  Confusion None  Danger to Self  Current suicidal ideation? Passive  Description of Suicide Plan none  Self-Injurious Behavior No self-injurious ideation or behavior indicators observed or expressed   Danger to Others  Danger to Others None reported or observed   Patient is A&O*4 Denies SI/HI/AVH Was calm and cooperative throughout the shift Called the the provider for his superficial cuts as per patient's request but suggested to  be open to air for better healing. Will continue to monitor

## 2023-04-29 MED ORDER — PNEUMOCOCCAL 20-VAL CONJ VACC 0.5 ML IM SUSY
0.5000 mL | PREFILLED_SYRINGE | INTRAMUSCULAR | Status: DC
  Filled 2023-04-29: qty 0.5

## 2023-04-29 MED ORDER — PNEUMOCOCCAL 20-VAL CONJ VACC 0.5 ML IM SUSY
0.5000 mL | PREFILLED_SYRINGE | Freq: Once | INTRAMUSCULAR | Status: AC
  Administered 2023-04-29: 0.5 mL via INTRAMUSCULAR
  Filled 2023-04-29: qty 0.5

## 2023-04-29 MED ORDER — ENSURE ENLIVE PO LIQD
237.0000 mL | Freq: Two times a day (BID) | ORAL | Status: DC
  Administered 2023-04-29 (×2): 237 mL via ORAL
  Filled 2023-04-29 (×9): qty 237

## 2023-04-29 NOTE — Group Note (Signed)
Date:  04/29/2023 Time:  4:38 PM  Group Topic/Focus:  Building Self Esteem:   The Focus of this group is helping patients become aware of the effects of self-esteem on their lives, the things they and others do that enhance or undermine their self-esteem, seeing the relationship between their level of self-esteem and the choices they make and learning ways to enhance self-esteem. Dimensions of Wellness:   The focus of this group is to introduce the topic of wellness and discuss the role each dimension of wellness plays in total health. Emotional Education:   The focus of this group is to discuss what feelings/emotions are, and how they are experienced.    Participation Level:  Did Not Attend  Participation Quality:   n/a  Affect:   n/a  Cognitive:   n/a  Insight: None  Engagement in Group:   n/a  Modes of Intervention:   n/a  Additional Comments:   Pt did not attend.  Angel [name] 04/29/2023, 4:38 PM

## 2023-04-29 NOTE — Group Note (Signed)
Date:  04/29/2023 Time:  1:08 PM  Group Topic/Focus:  Goals Group:   The focus of this group is to help patients establish daily goals to achieve during treatment and discuss how the patient can incorporate goal setting into their daily lives to aide in recovery. Orientation:   The focus of this group is to educate the patient on the purpose and policies of crisis stabilization and provide a format to answer questions about their admission.  The group details unit policies and expectations of patients while admitted.    Participation Level:  Active  Participation Quality:  Appropriate  Affect:  Appropriate  Cognitive:  Appropriate  Insight: Appropriate  Engagement in Group:  Engaged  Modes of Intervention:  Discussion, Orientation, and Support  Additional Comments:   Pt attended and participated in the Orientation/Goals group. Pt personal goal is to stay up all day.  Edmund Hilda Terrance Usery 04/29/2023, 1:08 PM

## 2023-04-29 NOTE — B[facility] Counselor (Signed)
Adult Comprehensive Assessment  Patient ID: Angel [name], male   DOB: January 10, 1981, 42 y.o.   MRN: [id number]  Information Source: Information source: Patient  Current Stressors:  Patient states their primary concerns and needs for treatment are:: "To stay on my meds" Patient states their goals for this hospitilization and ongoing recovery are:: "to stop cutting myself." Educational / Learning stressors: denies Employment / Job issues: "I can work but I just don't" Family Relationships: "they don't help me or talk to me." Financial / Lack of resources (include bankruptcy): no income Housing / Lack of housing: "the place i was staying at, I got kicked out. The owner sold it." Physical health (include injuries & life threatening diseases): denies Social relationships: "I am to myself" Substance abuse: denies Bereavement / Loss: loss of housing; "my younger brother hung himself a few years ago."  Living/Environment/Situation:  Living Arrangements: Other (Comment) (reports that he was just "kicked out") Living conditions (as described by patient or guardian): currently homeless How long has patient lived in current situation?: few days  Family History:  Marital status: Single Are you sexually active?: No Does patient have children?: No  Childhood History:  By whom was/is the patient raised?: Both parents Description of patient's relationship with caregiver when they were a child: Mother: good Does patient have siblings?: Yes Number of Siblings: 3 Did patient suffer any verbal/emotional/physical/sexual abuse as a child?: No Did patient suffer from severe childhood neglect?: No Has patient ever been sexually abused/assaulted/raped as an adolescent or adult?: No Was the patient ever a victim of a crime or a disaster?: No Witnessed domestic violence?: No Has patient been affected by domestic violence as an adult?: No  Education:  Currently a Consulting civil engineer?: No Learning disability?:  No  Employment/Work Situation:   Employment Situation: Unemployed Patient's Job has Been Impacted by Current Illness: No What is the Longest Time Patient has Held a Job?: 20 years 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?: No  Financial Resources:   Financial resources: No income Does patient have a Lawyer or guardian?: No  Alcohol/Substance Abuse:   What has been your use of drugs/alcohol within the last 12 months?: denies Alcohol/Substance Abuse Treatment Hx: Denies past history  Social Support System:   Forensic psychologist System: None Museum/gallery exhibitions officer System: reports that he use to attend [employer] Type of faith/religion: "no" How does patient's faith help to cope with current illness?: "I don't have a religion"  Leisure/Recreation:   Do You Have Hobbies?: No  Strengths/Needs:   What is the patient's perception of their strengths?: "I don't have any" Patient states they can use these personal strengths during their treatment to contribute to their recovery: "None" Patient states these barriers may affect/interfere with their treatment: none Patient states these barriers may affect their return to the community: "I am homeless housing is a barrier"  Discharge Plan:   Currently receiving community mental health services: No Patient states concerns and preferences for aftercare planning are: "I want a different doctor then the one I had at [facility]." Patient states they will know when they are safe and ready for discharge when: "let the doctor decide" Does patient have access to transportation?: No Patient description of barriers related to discharge medications: has insurance Plan for no access to transportation at discharge: CSW to monitor Plan for living situation after discharge: homeless shelter Will patient be returning to same living situation after discharge?:  No  Summary/Recommendations:   Summary and Recommendations (to be completed by the evaluator): Angel [name] is a 42 year old male that was admitted into [facility] on 04/28/2023. For worsening of his symptoms.  He has a history of  suicidal  thoughts about 6-7 times.  Current ideations with episodes of multiple superficial self mutilation on both forearms and plan to hang self in the context of homelessness, lack of food and no family support. He reports that he was just "kicked out" of the home and is now homeless.  His younger brother committed suicide a few years ago, hung himself. While here, Angel [name] can benefit from crisis stabilization, medication management, therapeutic milieu, and referrals for services.   Marinda Elk. 04/29/2023

## 2023-04-29 NOTE — Progress Notes (Signed)
   04/29/23 2216  Psych Admission Type (Psych Patients Only)  Admission Status Voluntary  Psychosocial Assessment  Patient Complaints Depression  Eye Contact Brief  Facial Expression Flat  Affect Appropriate to circumstance  Speech Logical/coherent  Interaction Minimal  Motor Activity Slow  Appearance/Hygiene Unremarkable  Behavior Characteristics Appropriate to situation  Mood Pleasant;Depressed  Thought Process  Coherency WDL  Content WDL  Delusions None reported or observed  Perception WDL  Hallucination None reported or observed  Judgment Poor  Confusion None  Danger to Self  Current suicidal ideation? Denies  Self-Injurious Behavior No self-injurious ideation or behavior indicators observed or expressed   Agreement Not to Harm Self Yes  Description of Agreement verbal  Danger to Others  Danger to Others None reported or observed

## 2023-04-29 NOTE — Progress Notes (Addendum)
[facility] MD Progress Note  04/29/2023 10:58 AM CAROL BELLUS  MRN:  [id number]  Principal Problem: MDD (major depressive disorder), recurrent severe, without psychosis (HCC) Diagnosis: Principal Problem:   MDD (major depressive disorder), recurrent severe, without psychosis (HCC)  Reason for admission:  Angel [name]. Angel "Angel [name]" is a 42 year old Caucasian male with prior psychiatric history significant for major depressive disorder recurrent severe without psychotic features, generalized anxiety disorder and tobacco use disorder.  Patient presents voluntarily to [facility] from [facility] ED after stabilization for worsening depression resulting in suicidal ideations with episodes of multiple superficial self mutilation on both forearms and plan to hang self in the context of homelessness, lack of food and no family support.   24-hour chart Review: Past 24 hours of patient's chart was reviewed.  Patient is compliant with scheduled meds. Required Agitation PRNs: none As needed medications: Hydroxyzine 25 mg p.o. given at 1128 yesterday for anxiety Per RN notes, no documented behavioral issues, however not attending therapeutic milieu and group activities. Patient slept, 8.5 hours  Today's assessment notes: Patient seen and examined on [address].  Angel [name] presents alert, oriented to person, place, & situation.  Angel [name] appears disheveled, bizarre and unkempt.  Continues to encourage to take a shower and brush his teeth.  Made staff aware to provide ADLs supplies, clean down and mouthwash.  Mood is dysphoric with constricted affect.  Speech is clear and coherent.  Able to maintain fair eye contact with this provider.  Angel [name] reports anxiety of #4/10 and depression as #6/10.  Angel [name] continues on his scheduled medications of Prozac 20 mg p.o. daily for depression, Abilify 5 mg p.o. daily to augment antidepressant, Remeron 7.5 p.o. q. nightly for depression and sleep.  Reports appetite  is fair, Ensure plus feeding supplements twice daily ordered. Superficial intentional self-inflicted wounds to bilateral forearms healing.  Patient remains in his room and not attending therapeutic milieu group activities, encouraged to attend as studies indicate this can improved patient mood.  Patient does not appear to be responding to internal stimuli.  Angel [name] denies SI, HI, or AVH.  Will continue current treatment plan with adjustment as indicated below.  Total Time spent with patient: 30 minutes  Past Psychiatric History: Previous Psych Diagnoses: MDD, anxiety, generalized anxiety disorder. Prior inpatient treatment: Admitted to behavioral health in 08/2018, for suicidal attempt, 03/09/2018 admitted to behavioral health for suicidal ideations, and 05/16/2017 admitted to behavioral health for suicidal ideation, 10/31/2013 admitted to behavioral health for suicidal ideation.  Reported being admitted to behavioral health in [place] but does not remember the year. Current/prior outpatient treatment: Admitted to [facility] in 2021. Prior rehab hx: Denies Psychotherapy hx: Yes History of suicide: 6 or 7 times History of homicide or aggression: Denies Psychiatric medication history: Yes, patient had trials of fluoxetine, Remeron, Abilify, for treatment of depression Psychiatric medication compliance history: Noncompliant due to financial difficulty Neuromodulation history: Denies  Current Psychiatrist: Denies Current therapist: Denies   Past Medical History:  Past Medical History:  Diagnosis Date   Anxiety    Depression    Migraine    History reviewed. No pertinent surgical history. Family History:  Family History  Problem Relation Age of Onset   Cancer Mother        brain   Diabetes Brother    Hypertension Brother    Family Psychiatric  History: See H&P  Social History:  Social History   Substance and Sexual Activity  Alcohol Use No  Social History   Substance and Sexual  Activity  Drug Use Yes   Types: Marijuana    Social History   Socioeconomic History   Marital status: Single    Spouse name: Not on file   Number of children: Not on file   Years of education: Not on file   Highest education level: Not on file  Occupational History   Not on file  Tobacco Use   Smoking status: Every Day    Packs/day: 1.50    Years: 20.00    Additional pack years: 0.00    Total pack years: 30.00    Types: Cigarettes   Smokeless tobacco: Never  Vaping Use   Vaping Use: Never used  Substance and Sexual Activity   Alcohol use: No   Drug use: Yes    Types: Marijuana   Sexual activity: Not Currently    Birth control/protection: Condom  Other Topics Concern   Not on file  Social History Narrative   Not on file   Social Determinants of Health   Financial Resource Strain: Not on file  Food Insecurity: Food Insecurity Present (04/28/2023)   Hunger Vital Sign    Worried About Running Out of Food in the Last Year: Sometimes true    Ran Out of Food in the Last Year: Sometimes true  Transportation Needs: Unmet Transportation Needs (04/28/2023)   PRAPARE - Administrator, Civil Service (Medical): No    Lack of Transportation (Non-Medical): Yes  Physical Activity: Not on file  Stress: Not on file  Social Connections: Not on file   Additional Social History:    Sleep: Good  Appetite:  Fair  Current Medications: Current Facility-Administered Medications  Medication Dose Route Frequency Provider Last Rate Last Admin   acetaminophen (TYLENOL) tablet 650 mg  650 mg Oral Q6H PRN Ajibola, Ene A, NP       alum & mag hydroxide-simeth (MAALOX/MYLANTA) 200-200-20 MG/5ML suspension 30 mL  30 mL Oral Q4H PRN Ajibola, Ene A, NP       ARIPiprazole (ABILIFY) tablet 5 mg  5 mg Oral Daily Alyanah Elliott, Jesusita Oka, FNP   5 mg at 04/29/23 0102   diphenhydrAMINE (BENADRYL) capsule 50 mg  50 mg Oral TID PRN Ajibola, Ene A, NP       Or   diphenhydrAMINE (BENADRYL) injection 50 mg   50 mg Intramuscular TID PRN Ajibola, Ene A, NP       FLUoxetine (PROZAC) capsule 20 mg  20 mg Oral Daily Bonny Vanleeuwen, Jesusita Oka, FNP   20 mg at 04/29/23 0804   haloperidol (HALDOL) tablet 5 mg  5 mg Oral TID PRN Ajibola, Ene A, NP       Or   haloperidol lactate (HALDOL) injection 5 mg  5 mg Intramuscular TID PRN Ajibola, Ene A, NP       hydrOXYzine (ATARAX) tablet 25 mg  25 mg Oral TID PRN Ajibola, Ene A, NP   25 mg at 04/28/23 1128   LORazepam (ATIVAN) tablet 2 mg  2 mg Oral TID PRN Ajibola, Ene A, NP       Or   LORazepam (ATIVAN) injection 2 mg  2 mg Intramuscular TID PRN Ajibola, Ene A, NP       magnesium hydroxide (MILK OF MAGNESIA) suspension 30 mL  30 mL Oral Daily PRN Ajibola, Ene A, NP       mirtazapine (REMERON) tablet 7.5 mg  7.5 mg Oral QHS Bode Pieper C, FNP   7.5 mg  at 04/28/23 2120   nicotine (NICODERM CQ - dosed in mg/24 hours) patch 14 mg  14 mg Transdermal Daily Bobbitt, Shalon E, NP   14 mg at 04/29/23 0806   pantoprazole (PROTONIX) EC tablet 20 mg  20 mg Oral Daily Angeliyah Kirkey C, FNP   20 mg at 04/29/23 0805   pneumococcal 23 valent vaccine (PNEUMOVAX-23) injection 0.5 mL  0.5 mL Intramuscular Tomorrow-1000 Bobbitt, Shalon E, NP       traZODone (DESYREL) tablet 50 mg  50 mg Oral QHS Oni Dietzman, Jesusita Oka, FNP   50 mg at 04/28/23 2119   Lab Results:  Results for orders placed or performed during the hospital encounter of 04/27/23 (from the past 48 hour(s))  Rapid urine drug screen (hospital performed)     Status: Abnormal   Collection Time: 04/27/23 11:50 AM  Result Value Ref Range   Opiates NONE DETECTED NONE DETECTED   Cocaine NONE DETECTED NONE DETECTED   Benzodiazepines NONE DETECTED NONE DETECTED   Amphetamines NONE DETECTED NONE DETECTED   Tetrahydrocannabinol POSITIVE (A) NONE DETECTED   Barbiturates NONE DETECTED NONE DETECTED    Comment: (NOTE) DRUG SCREEN FOR MEDICAL PURPOSES ONLY.  IF CONFIRMATION IS NEEDED FOR ANY PURPOSE, NOTIFY LAB WITHIN 5 DAYS.  LOWEST DETECTABLE  LIMITS FOR URINE DRUG SCREEN Drug Class                     Cutoff (ng/mL) Amphetamine and metabolites    1000 Barbiturate and metabolites    200 Benzodiazepine                 200 Opiates and metabolites        300 Cocaine and metabolites        300 THC                            50 Performed at [facility], [address], [place], [place] [address]   Comprehensive metabolic panel     Status: None   Collection Time: 04/27/23 11:58 AM  Result Value Ref Range   Sodium 135 135 - 145 mmol/L   Potassium 3.7 3.5 - 5.1 mmol/L   Chloride 99 98 - 111 mmol/L   CO2 25 22 - 32 mmol/L   Glucose, Bld 79 70 - 99 mg/dL    Comment: Glucose reference range applies only to samples taken after fasting for at least 8 hours.   BUN 10 6 - 20 mg/dL   Creatinine, Ser 7.84 0.61 - 1.24 mg/dL   Calcium 9.3 8.9 - 69.6 mg/dL   Total Protein 7.8 6.5 - 8.1 g/dL   Albumin 4.7 3.5 - 5.0 g/dL   AST 21 15 - 41 U/L   ALT 19 0 - 44 U/L   Alkaline Phosphatase 65 38 - 126 U/L   Total Bilirubin 0.9 0.3 - 1.2 mg/dL   GFR, Estimated >29 >52 mL/min    Comment: (NOTE) Calculated using the CKD-EPI Creatinine Equation (2021)    Anion gap 11 5 - 15    Comment: Performed at [facility], [address], [place], [place] [address]  Ethanol     Status: None   Collection Time: 04/27/23 11:58 AM  Result Value Ref Range   Alcohol, Ethyl (B) <10 <10 mg/dL    Comment: (NOTE) Lowest detectable limit for serum alcohol is 10 mg/dL.  For medical purposes only. Performed at [facility], [address] [place],  [place] [address]   Salicylate level     Status: Abnormal   Collection Time: 04/27/23 11:58 AM  Result Value Ref Range   Salicylate Lvl <7.0 (L) 7.0 - 30.0 mg/dL    Comment: Performed at [facility], [address] [place], [place] [address]  Acetaminophen level     Status: Abnormal   Collection Time: 04/27/23 11:58 AM  Result Value Ref Range   Acetaminophen (Tylenol), Serum <10 (L) 10 - 30 ug/mL    Comment:  (NOTE) Therapeutic concentrations vary significantly. A range of 10-30 ug/mL  may be an effective concentration for many patients. However, some  are best treated at concentrations outside of this range. Acetaminophen concentrations >150 ug/mL at 4 hours after ingestion  and >50 ug/mL at 12 hours after ingestion are often associated with  toxic reactions.  Performed at [facility], [address], [place], [place] [address]   cbc     Status: None   Collection Time: 04/27/23 11:58 AM  Result Value Ref Range   WBC 7.1 4.0 - 10.5 K/uL   RBC 5.41 4.22 - 5.81 MIL/uL   Hemoglobin 16.1 13.0 - 17.0 g/dL   HCT 21.3 08.6 - 57.8 %   MCV 87.8 80.0 - 100.0 fL   MCH 29.8 26.0 - 34.0 pg   MCHC 33.9 30.0 - 36.0 g/dL   RDW 46.9 62.9 - 52.8 %   Platelets 226 150 - 400 K/uL   nRBC 0.0 0.0 - 0.2 %    Comment: Performed at [facility], [address] [place], [place] [address]   Blood Alcohol level:  Lab Results  Component Value Date   [facility] <10 04/27/2023   ETH <10 08/27/2018    Metabolic Disorder Labs: Lab Results  Component Value Date   HGBA1C 5.3 08/28/2018   MPG 105 08/28/2018   MPG 105 05/17/2017   Lab Results  Component Value Date   PROLACTIN 6.7 08/28/2018   PROLACTIN 17.3 (H) 05/17/2017   Lab Results  Component Value Date   CHOL 171 08/28/2018   TRIG 107 08/28/2018   HDL 46 08/28/2018   CHOLHDL 3.7 08/28/2018   VLDL 21 08/28/2018   LDLCALC 104 (H) 08/28/2018   LDLCALC 128 (H) 05/17/2017   Physical Findings: AIMS:  , ,  ,  ,    CIWA:    COWS:     Musculoskeletal: Strength & Muscle Tone: within normal limits Gait & Station: normal Patient leans: N/A  Psychiatric Specialty Exam:  Presentation  General Appearance:  Bizarre; Disheveled  Eye Contact: Fair  Speech: Clear and Coherent; Normal Rate  Speech Volume: Normal  Handedness: Right  Mood and Affect  Mood: Anxious; Depressed; Dysphoric  Affect: Constricted  Thought Process  Thought  Processes: Coherent; Linear  Descriptions of Associations:Intact  Orientation:Full (Time, Place and Person)  Thought Content:Rumination; Logical  History of Schizophrenia/Schizoaffective disorder:No  Duration of Psychotic Symptoms:No data recorded Hallucinations:Hallucinations: None  Ideas of Reference:None  Suicidal Thoughts:Suicidal Thoughts: No  Homicidal Thoughts:Homicidal Thoughts: No  Sensorium  Memory: Immediate Fair; Recent Fair  Judgment: Poor  Insight: Shallow; Poor  Executive Functions  Concentration: Fair  Attention Span: Fair  Recall: [employer] of Knowledge: Fair  Language: Good  Psychomotor Activity  Psychomotor Activity: Psychomotor Activity: Normal  Assets  Assets: Communication Skills; Desire for Improvement; Resilience; Physical Health  Sleep  Sleep: Sleep: Good Number of Hours of Sleep: 8.5  Physical Exam: Physical Exam Vitals and nursing note reviewed.  Constitutional:  Appearance: Angel [name] is ill-appearing and toxic-appearing.  HENT:     Head: Normocephalic.     Nose: Nose normal.     Mouth/Throat:     Mouth: Mucous membranes are moist.  Eyes:     Pupils: Pupils are equal, round, and reactive to light.  Cardiovascular:     Rate and Rhythm: Normal rate.  Pulmonary:     Effort: Pulmonary effort is normal.  Abdominal:     Comments: deferred  Genitourinary:    Comments: deferred Musculoskeletal:        General: Normal range of motion.     Cervical back: Normal range of motion.  Skin:    General: Skin is warm.  Neurological:     Mental Status: Angel [name] is alert and oriented to person, place, and time.  Psychiatric:     Comments: Disheveled and constricted affect    Review of Systems  Constitutional:  Negative for fever.  HENT:         Hair unkempt and disheveled  Eyes: Negative.   Respiratory:  Negative for shortness of breath.   Cardiovascular:  Negative for chest pain.  Gastrointestinal:  Negative for nausea  and vomiting.  Genitourinary: Negative.   Musculoskeletal: Negative.   Skin: Negative.        Superficial intentional self-inflicted wounds to right lateral forearm healing  Neurological: Negative.   Endo/Heme/Allergies:        See allergy listing  Psychiatric/Behavioral:  Positive for depression and substance abuse. The patient is nervous/anxious and has insomnia.    Blood pressure 120/78, pulse 75, temperature 98.1 F (36.7 C), temperature source Oral, resp. rate 16, height 5\' 8"  (1.727 m), weight 67.2 kg, SpO2 100 %. Body mass index is 22.51 kg/m.  Treatment Plan Summary: Daily contact with patient to assess and evaluate symptoms and progress in treatment and Medication management Treatment Plan Summary: Daily contact with patient to assess and evaluate symptoms and progress in treatment and Medication management     Physician Treatment Plan for Primary Diagnosis:  Assessment MDD (major depressive disorder), recurrent severe, without psychosis (HCC)   Plan: Medications: Continue fluoxetine 20 mg p.o. daily for depression Continue Abilify 5 mg p.o. daily for mood stabilization Continue Remeron 7.5 mg p.o. q. nightly for depression and sleep Continue hydroxyzine 25 mg p.o. 3 times daily as needed for anxiety Continue trazodone 50 mg p.o. nightly as needed for insomnia   Other medical issues: Nicotine NicoDerm CQ-dose in milligrams/24 hours patch 14 mg transdermal over 24 hours daily for smoking cessation Protonix 20 mg p.o. daily for GERD Ensure Plus feeding supplement twice daily to improve nutrition  Agitation protocol: Benadryl capsule 50 mg p.o. 3 times daily as needed agitation or Benadryl injection 50 mg IM 3 times daily as needed agitation   Haldol tablets 5 mg po 3 times daily as needed agitation or Haldol lactate injection 5 mg IM 3 times daily as needed agitation   Lorazepam tablet 2 mg p.o. 3 times daily as needed agitation or Lorazepam injection 2 mg IM 3  times daily as needed agitation   Other PRN Medications -Acetaminophen 650 mg every 6 as needed/mild pain -Maalox 30 mL oral every 4 as needed/digestion -Magnesium hydroxide 30 mL daily as needed/mild constipation   -- The risks/benefits/side-effects/alternatives to this medication were discussed in detail with the patient and time was given for questions. The patient consents to medication trial.              -- Encouraged patient  to participate  in unit milieu and in scheduled group therapies    Safety and Monitoring: Voluntary admission to inpatient psychiatric unit for safety, stabilization and treatment Daily contact with patient to assess and evaluate symptoms and progress in treatment Patient's case to be discussed in multi-disciplinary team meeting Observation Level : q15 minute checks Vital signs: q12 hours Precautions: suicide, but pt currently verbally contracts for safety on unit    Discharge Planning: Social work and case management to assist with discharge planning and identification of hospital follow-up needs prior to discharge Estimated LOS: 5-7 days Discharge Concerns: Need to establish a safety plan; Medication compliance and effectiveness Discharge Goals: Return home with outpatient referrals for mental health follow-up including medication management/psychotherapy.   Long Term Goal(s): Improvement in symptoms so as ready for discharge   Short Term Goals: Ability to identify changes in lifestyle to reduce recurrence of condition will improve, Ability to verbalize feelings will improve, Ability to disclose and discuss suicidal ideas, Ability to demonstrate self-control will improve, Ability to identify and develop effective coping behaviors will improve, Ability to maintain clinical measurements within normal limits will improve, Compliance with prescribed medications will improve, and Ability to identify triggers associated with substance abuse/mental health issues  will improve   Physician Treatment Plan for Secondary Diagnosis: Principal Problem:   MDD (major depressive disorder), recurrent severe, without psychosis (HCC)   Cecilie Lowers, FNP 04/29/2023, 10:58 AM

## 2023-04-29 NOTE — [facility] Group Notes (Signed)
[facility] Group Notes:  (Nursing)  Date:  04/29/2023  Time:1415  Type of Therapy:  Psychoeducational Skills  Participation Level:  Did Not Attend    Shela Nevin 04/29/2023, 3:24 PM

## 2023-04-29 NOTE — Progress Notes (Addendum)
D. Pt continues to present very depressed- isolating to room. Pt did take a shower and put on clean scrubs with some encouragement. Pt reported sleeping okay, but continues to endorse poor appetite, low energy level and poor concentration.  Per pt's self inventory, pt rated his depression,hopelessness and anxiety a 7/1/8, respectively. Pt's stated goal is "try to stay awake today." Pt currently denies SI/HI and AVH  A. Labs and vitals monitored. Pt given and educated on medications. Pt supported emotionally and encouraged to express concerns and ask questions.   R. Pt remains safe with 15 minute checks. Will continue POC.    04/29/23 1300  Psych Admission Type (Psych Patients Only)  Admission Status Voluntary  Psychosocial Assessment  Patient Complaints Depression  Eye Contact Fair  Facial Expression Sad  Affect Appropriate to circumstance  Speech Logical/coherent  Interaction Minimal;Isolative  Motor Activity Slow  Appearance/Hygiene Disheveled;Poor hygiene  Behavior Characteristics Cooperative  Mood Depressed;Pleasant  Thought Process  Coherency WDL  Content WDL  Delusions None reported or observed  Perception WDL  Hallucination None reported or observed  Judgment Poor  Confusion None  Danger to Self  Current suicidal ideation? Passive  Description of Suicide Plan no plan  Self-Injurious Behavior No self-injurious ideation or behavior indicators observed or expressed   Agreement Not to Harm Self Yes  Description of Agreement agreed to contact staff before acting on harmful thoughts  Danger to Others  Danger to Others None reported or observed

## 2023-04-30 ENCOUNTER — Encounter (HOSPITAL_COMMUNITY): Payer: Self-pay

## 2023-04-30 DIAGNOSIS — F332 Major depressive disorder, recurrent severe without psychotic features: Principal | ICD-10-CM

## 2023-04-30 LAB — LIPID PANEL
Cholesterol: 168 mg/dL (ref 0–200)
HDL: 55 mg/dL (ref >40)
LDL Cholesterol: 91 mg/dL (ref 0–99)
Total CHOL/HDL Ratio: 3.1 RATIO
Triglycerides: 108 mg/dL (ref <150)
VLDL: 22 mg/dL (ref 0–40)

## 2023-04-30 MED ORDER — FLUOXETINE HCL 20 MG PO CAPS
40.0000 mg | ORAL_CAPSULE | Freq: Every day | ORAL | Status: DC
  Administered 2023-05-01 – 2023-05-03 (×3): 40 mg via ORAL
  Filled 2023-04-30 (×4): qty 2

## 2023-04-30 NOTE — Group Note (Signed)
Occupational Therapy Group Note  Group Topic: Sleep Hygiene  Group Date: 04/30/2023 Start Time: 1430 End Time: 1505 Facilitators: Ted Mcalpine, OT   Group Description: Group encouraged increased participation and engagement through topic focused on sleep hygiene. Patients reflected on the quality of sleep they typically receive and identified areas that need improvement. Group was given background information on sleep and sleep hygiene, including common sleep disorders. Group members also received information on how to improve one's sleep and introduced a sleep diary as a tool that can be utilized to track sleep quality over a length of time. Group session ended with patients identifying one or more strategies they could utilize or implement into their sleep routine in order to improve overall sleep quality.        Therapeutic Goal(s):  Identify one or more strategies to improve overall sleep hygiene  Identify one or more areas of sleep that are negatively impacted (sleep too much, too little, etc)     Participation Level: Minimal   Participation Quality: Independent   Behavior: Calm   Speech/Thought Process: Barely audible   Affect/Mood: Flat   Insight: Limited   Judgement: Limited      Modes of Intervention: Education  Patient Response to Interventions:  Disengaged   Plan: Continue to engage patient in OT groups 2 - 3x/week.  04/30/2023  Ted Mcalpine, OT   Kerrin Champagne, OT

## 2023-04-30 NOTE — BH IP Treatment Plan (Signed)
Interdisciplinary Treatment and Diagnostic Plan Update  04/30/2023 Time of Session: 11:00am Angel [name] MRN: [id number]  Principal Diagnosis: MDD (major depressive disorder), recurrent severe, without psychosis (HCC)  Secondary Diagnoses: Principal Problem:   MDD (major depressive disorder), recurrent severe, without psychosis (HCC)   Current Medications:  Current Facility-Administered Medications  Medication Dose Route Frequency Provider Last Rate Last Admin   acetaminophen (TYLENOL) tablet 650 mg  650 mg Oral Q6H PRN Ajibola, Ene A, NP       alum & mag hydroxide-simeth (MAALOX/MYLANTA) 200-200-20 MG/5ML suspension 30 mL  30 mL Oral Q4H PRN Ajibola, Ene A, NP       ARIPiprazole (ABILIFY) tablet 5 mg  5 mg Oral Daily Ntuen, Jesusita Oka, FNP   5 mg at 04/30/23 0744   diphenhydrAMINE (BENADRYL) capsule 50 mg  50 mg Oral TID PRN Ajibola, Ene A, NP       Or   diphenhydrAMINE (BENADRYL) injection 50 mg  50 mg Intramuscular TID PRN Ajibola, Ene A, NP       feeding supplement (ENSURE ENLIVE / ENSURE PLUS) liquid 237 mL  237 mL Oral BID BM Ntuen, Tina C, FNP   237 mL at 04/29/23 1608   FLUoxetine (PROZAC) capsule 20 mg  20 mg Oral Daily Ntuen, Jesusita Oka, FNP   20 mg at 04/30/23 0745   haloperidol (HALDOL) tablet 5 mg  5 mg Oral TID PRN Ajibola, Ene A, NP       Or   haloperidol lactate (HALDOL) injection 5 mg  5 mg Intramuscular TID PRN Ajibola, Ene A, NP       hydrOXYzine (ATARAX) tablet 25 mg  25 mg Oral TID PRN Ajibola, Ene A, NP   25 mg at 04/29/23 1345   LORazepam (ATIVAN) tablet 2 mg  2 mg Oral TID PRN Ajibola, Ene A, NP       Or   LORazepam (ATIVAN) injection 2 mg  2 mg Intramuscular TID PRN Ajibola, Ene A, NP       magnesium hydroxide (MILK OF MAGNESIA) suspension 30 mL  30 mL Oral Daily PRN Ajibola, Ene A, NP       mirtazapine (REMERON) tablet 7.5 mg  7.5 mg Oral QHS Ntuen, Tina C, FNP   7.5 mg at 04/29/23 2124   nicotine (NICODERM CQ - dosed in mg/24 hours) patch 14 mg  14 mg Transdermal  Daily Bobbitt, Shalon E, NP   14 mg at 04/30/23 0743   pantoprazole (PROTONIX) EC tablet 20 mg  20 mg Oral Daily Ntuen, Tina C, FNP   20 mg at 04/30/23 0744   traZODone (DESYREL) tablet 50 mg  50 mg Oral QHS Ntuen, Tina C, FNP   50 mg at 04/29/23 2124   PTA Medications: No medications prior to admission.    Patient Stressors: Financial difficulties   Loss of housing   Medication change or noncompliance    Patient Strengths: Active sense of humor  Average or above average intelligence  Communication skills  Physical Health   Treatment Modalities: Medication Management, Group therapy, Case management,  1 to 1 session with clinician, Psychoeducation, Recreational therapy.   Physician Treatment Plan for Primary Diagnosis: MDD (major depressive disorder), recurrent severe, without psychosis (HCC) Long Term Goal(s): Improvement in symptoms so as ready for discharge   Short Term Goals: Ability to identify changes in lifestyle to reduce recurrence of condition will improve Ability to verbalize feelings will improve Ability to disclose and discuss suicidal ideas Ability to demonstrate self-control  will improve Ability to identify and develop effective coping behaviors will improve Ability to maintain clinical measurements within normal limits will improve Compliance with prescribed medications will improve Ability to identify triggers associated with substance abuse/mental health issues will improve  Medication Management: Evaluate patient's response, side effects, and tolerance of medication regimen.  Therapeutic Interventions: 1 to 1 sessions, Unit Group sessions and Medication administration.  Evaluation of Outcomes: Progressing  Physician Treatment Plan for Secondary Diagnosis: Principal Problem:   MDD (major depressive disorder), recurrent severe, without psychosis (HCC)  Long Term Goal(s): Improvement in symptoms so as ready for discharge   Short Term Goals: Ability to  identify changes in lifestyle to reduce recurrence of condition will improve Ability to verbalize feelings will improve Ability to disclose and discuss suicidal ideas Ability to demonstrate self-control will improve Ability to identify and develop effective coping behaviors will improve Ability to maintain clinical measurements within normal limits will improve Compliance with prescribed medications will improve Ability to identify triggers associated with substance abuse/mental health issues will improve     Medication Management: Evaluate patient's response, side effects, and tolerance of medication regimen.  Therapeutic Interventions: 1 to 1 sessions, Unit Group sessions and Medication administration.  Evaluation of Outcomes: Progressing   RN Treatment Plan for Primary Diagnosis: MDD (major depressive disorder), recurrent severe, without psychosis (HCC) Long Term Goal(s): Knowledge of disease and therapeutic regimen to maintain health will improve  Short Term Goals: Ability to remain free from injury will improve, Ability to verbalize frustration and anger appropriately will improve, Ability to demonstrate self-control, Ability to participate in decision making will improve, Ability to verbalize feelings will improve, Ability to disclose and discuss suicidal ideas, Ability to identify and develop effective coping behaviors will improve, and Compliance with prescribed medications will improve  Medication Management: RN will administer medications as ordered by provider, will assess and evaluate patient's response and provide education to patient for prescribed medication. RN will report any adverse and/or side effects to prescribing provider.  Therapeutic Interventions: 1 on 1 counseling sessions, Psychoeducation, Medication administration, Evaluate responses to treatment, Monitor vital signs and CBGs as ordered, Perform/monitor CIWA, COWS, AIMS and Fall Risk screenings as ordered, Perform  wound care treatments as ordered.  Evaluation of Outcomes: Progressing   LCSW Treatment Plan for Primary Diagnosis: MDD (major depressive disorder), recurrent severe, without psychosis (HCC) Long Term Goal(s): Safe transition to appropriate next level of care at discharge, Engage patient in therapeutic group addressing interpersonal concerns.  Short Term Goals: Engage patient in aftercare planning with referrals and resources, Increase social support, Increase ability to appropriately verbalize feelings, Increase emotional regulation, Facilitate acceptance of mental health diagnosis and concerns, Facilitate patient progression through stages of change regarding substance use diagnoses and concerns, Identify triggers associated with mental health/substance abuse issues, and Increase skills for wellness and recovery  Therapeutic Interventions: Assess for all discharge needs, 1 to 1 time with Social worker, Explore available resources and support systems, Assess for adequacy in community support network, Educate family and significant other(s) on suicide prevention, Complete Psychosocial Assessment, Interpersonal group therapy.  Evaluation of Outcomes: Progressing   Progress in Treatment: Attending groups: Yes. Participating in groups: Yes. Taking medication as prescribed: Yes. Toleration medication: Yes. Family/Significant other contact made: No, will contact:  pt declined consents  Patient understands diagnosis: Yes. Discussing patient identified problems/goals with staff: Yes. Medical problems stabilized or resolved: Yes. Denies suicidal/homicidal ideation: Yes. Issues/concerns per patient self-inventory: No.   New problem(s) identified: No, Describe:  none  reported   New Short Term/Long Term Goal(s):   medication stabilization, elimination of SI thoughts, development of comprehensive mental wellness plan.    Patient Goals:  Pt states, "I want to stop cutting myself"  Discharge  Plan or Barriers: Patient recently admitted. CSW will continue to follow and assess for appropriate referrals and possible discharge planning.    Reason for Continuation of Hospitalization: Anxiety Depression Medication stabilization Suicidal ideation Other; describe self harming behaviors  Estimated Length of Stay: 3-5 days  Last 3 [place] Suicide Severity Risk Score: Flowsheet Row Admission (Current) [facility] from 04/27/2023 in [facility] Department at [facility] ED from 10/08/2021 in [facility] Emergency Department at [facility]  C-SSRS RISK CATEGORY High Risk High Risk No Risk       Last [facility] 2/9 Scores:     No data to display          Scribe for Treatment Team: Beatris Si, LCSW 04/30/2023 4:10 PM

## 2023-04-30 NOTE — Progress Notes (Signed)
Psychoeducational Group Note  Date:  04/30/2023 Time:  2123  Group Topic/Focus:  Relapse Prevention Planning:   The focus of this group is to define relapse and discuss the need for planning to combat relapse.  Participation Level: Did Not Attend  Participation Quality:  Not Applicable  Affect:  Not Applicable  Cognitive:  Not Applicable  Insight:  Not Applicable  Engagement in Group: Not Applicable  Additional Comments:  The patient did not attend the evening A.A. speaker's meeting.   Hazle Coca S 04/30/2023, 9:24 PM

## 2023-04-30 NOTE — Group Note (Signed)
Recreation Therapy Group Note   Group Topic:Team Building  Group Date: 04/30/2023 Start Time: 0930 End Time: 0957 Facilitators: Kalliope Riesen-McCall, LRT,CTRS Location: [address] Dayroom   Goal Area(s) Addresses:  Patient will effectively work with peer towards shared goal.  Patient will identify skills used to make activity successful.  Patient will identify how skills used during activity can be applied to reach post d/c goals.   Group Description: [employer]. In teams of 5-6, patients were given 11 craft pipe cleaners. Using the materials provided, patients were instructed to compete again the opposing team(s) to build the tallest free-standing structure from floor level. The activity was timed; difficulty increased by Clinical research associate as Production designer, theatre/television/film continued.  Systematically resources were removed with additional directions for example, placing one arm behind their back, working in silence, and shape stipulations. LRT facilitated post-activity discussion reviewing team processes and necessary communication skills involved in completion. Patients were encouraged to reflect how the skills utilized, or not utilized, in this activity can be incorporated to positively impact support systems post discharge.   Affect/Mood: Appropriate   Participation Level: Engaged   Participation Quality: Independent   Behavior: Appropriate   Speech/Thought Process: Focused   Insight: Good   Judgement: Good   Modes of Intervention: STEM Activity   Patient Response to Interventions:  Engaged   Education Outcome:  Acknowledges education   Clinical Observations/Individualized Feedback: Pt attended and participated in group session.     Plan: Continue to engage patient in RT group sessions 2-3x/week.   Zack Crager-McCall, LRT,CTRS 04/30/2023 12:25 PM

## 2023-04-30 NOTE — Progress Notes (Signed)
D:  Patient's self inventory sheet, patient has fair sleep, no sleep medication.  Fair appetite, low energy level, good concentration.  Rated depression and hopeless 6, anxiety 7.  Denied withdrawals.  Denied SI.  Denied physical problems.  Denied physical pain.  Goal is stay up and attend groups.  No discharge plans. A:  Medications administered per MD orders.  Emotional support and encouragement given patient. R:  Denied SI and HI, contracts for safety.  Denied A/V hallucinations.  Safety maintained with 15 minute checks.

## 2023-04-30 NOTE — Progress Notes (Signed)
Pt did not attend wrap-up group   

## 2023-04-30 NOTE — Plan of Care (Signed)
Nurse discussed anxiety, depression and coping skills with patient.  

## 2023-04-30 NOTE — Progress Notes (Signed)
[facility] MD Progress Note  04/30/2023 5:44 PM Angel [name]  MRN:  [id number]  Principal Problem: MDD (major depressive disorder), recurrent severe, without psychosis (HCC) Diagnosis: Principal Problem:   MDD (major depressive disorder), recurrent severe, without psychosis (HCC)  Reason for admission:  Angel [name]. Angel "Angel [name]" is a 42 year old Caucasian male with prior psychiatric history significant for major depressive disorder recurrent severe without psychotic features, generalized anxiety disorder and tobacco use disorder.  Patient presents voluntarily to [facility] from [facility] ED after stabilization for worsening depression resulting in suicidal ideations with episodes of multiple superficial self mutilation on both forearms and plan to hang self in the context of homelessness, lack of food and no family support.   24-hour chart Review: Past 24 hours of patient's chart was reviewed.  Patient is compliant with scheduled meds. Required Agitation PRNs: none As needed medications: Hydroxyzine 25 mg p.o. given at 1345 yesterday for anxiety Per RN notes, no documented behavioral issues, however not attending therapeutic milieu and group activities. Patient slept, 10 hours  Today's assessment notes: Patient seen and examined on [address].  He presents alert, oriented to person, place, & situation.  Observed with change to clean clothing, and reports having a shower today.  Her Hair remains unkempt however more presentable today.  Complemented patient on his ADLs efforts and he is smiled and return.  Mood is depressed and anxious with congruent affect.  Prozac increased from 20 mg to 40 mg p.o. daily starting tomorrow 05/01/2023 for depression.  Speech is clear and coherent.  Able to maintain fair eye contact with this provider.  He reports anxiety of #4/10 and depression as #6/10, with 10 being of highest severity.  He continues on his scheduled medications, Abilify  5 mg p.o. daily to augment antidepressant, Remeron 7.5 p.o. q. nightly for depression and sleep.  Reports appetite is fair, Ensure plus feeding supplements twice daily ordered. Superficial intentional self-inflicted wounds to bilateral forearms healing.  Patient visible on the unit and attending therapeutic milieu group activities.  Angel [name] does not appear to be responding to internal stimuli.  He denies SI, HI, or AVH.  Will continue current treatment plan with adjustment as indicated below.  Vital signs within normal limits.  Hemoglobin A1c and lipid panel ordered.  Total Time spent with patient: 30 minutes  Past Psychiatric History: Previous Psych Diagnoses: MDD, anxiety, generalized anxiety disorder. Prior inpatient treatment: Admitted to behavioral health in 08/2018, for suicidal attempt, 03/09/2018 admitted to behavioral health for suicidal ideations, and 05/16/2017 admitted to behavioral health for suicidal ideation, 10/31/2013 admitted to behavioral health for suicidal ideation.  Reported being admitted to behavioral health in [place] but does not remember the year. Current/prior outpatient treatment: Admitted to [facility] in 2021. Prior rehab hx: Denies Psychotherapy hx: Yes History of suicide: 6 or 7 times History of homicide or aggression: Denies Psychiatric medication history: Yes, patient had trials of fluoxetine, Remeron, Abilify, for treatment of depression Psychiatric medication compliance history: Noncompliant due to financial difficulty Neuromodulation history: Denies  Current Psychiatrist: Denies Current therapist: Denies   Past Medical History:  Past Medical History:  Diagnosis Date   Anxiety    Depression    Migraine    History reviewed. No pertinent surgical history. Family History:  Family History  Problem Relation Age of Onset   Cancer Mother        brain   Diabetes Brother    Hypertension Brother    Family Psychiatric  History:  See H&P  Social History:   Social History   Substance and Sexual Activity  Alcohol Use No     Social History   Substance and Sexual Activity  Drug Use Yes   Types: Marijuana    Social History   Socioeconomic History   Marital status: Single    Spouse name: Not on file   Number of children: Not on file   Years of education: Not on file   Highest education level: Not on file  Occupational History   Not on file  Tobacco Use   Smoking status: Every Day    Packs/day: 1.50    Years: 20.00    Additional pack years: 0.00    Total pack years: 30.00    Types: Cigarettes   Smokeless tobacco: Never  Vaping Use   Vaping Use: Never used  Substance and Sexual Activity   Alcohol use: No   Drug use: Yes    Types: Marijuana   Sexual activity: Not Currently    Birth control/protection: Condom  Other Topics Concern   Not on file  Social History Narrative   Not on file   Social Determinants of Health   Financial Resource Strain: Not on file  Food Insecurity: Food Insecurity Present (04/28/2023)   Hunger Vital Sign    Worried About Running Out of Food in the Last Year: Sometimes true    Ran Out of Food in the Last Year: Sometimes true  Transportation Needs: Unmet Transportation Needs (04/28/2023)   PRAPARE - Administrator, Civil Service (Medical): No    Lack of Transportation (Non-Medical): Yes  Physical Activity: Not on file  Stress: Not on file  Social Connections: Not on file   Additional Social History:    Sleep: Good  Appetite:  Fair  Current Medications: Current Facility-Administered Medications  Medication Dose Route Frequency Provider Last Rate Last Admin   acetaminophen (TYLENOL) tablet 650 mg  650 mg Oral Q6H PRN Ajibola, Ene A, NP       alum & mag hydroxide-simeth (MAALOX/MYLANTA) 200-200-20 MG/5ML suspension 30 mL  30 mL Oral Q4H PRN Ajibola, Ene A, NP       ARIPiprazole (ABILIFY) tablet 5 mg  5 mg Oral Daily Reilley Valentine, Jesusita Oka, FNP   5 mg at 04/30/23 0744   diphenhydrAMINE  (BENADRYL) capsule 50 mg  50 mg Oral TID PRN Ajibola, Ene A, NP       Or   diphenhydrAMINE (BENADRYL) injection 50 mg  50 mg Intramuscular TID PRN Ajibola, Ene A, NP       feeding supplement (ENSURE ENLIVE / ENSURE PLUS) liquid 237 mL  237 mL Oral BID BM Gailen Venne C, FNP   237 mL at 04/29/23 1608   FLUoxetine (PROZAC) capsule 20 mg  20 mg Oral Daily Liadan Guizar, Jesusita Oka, FNP   20 mg at 04/30/23 0745   haloperidol (HALDOL) tablet 5 mg  5 mg Oral TID PRN Ajibola, Ene A, NP       Or   haloperidol lactate (HALDOL) injection 5 mg  5 mg Intramuscular TID PRN Ajibola, Ene A, NP       hydrOXYzine (ATARAX) tablet 25 mg  25 mg Oral TID PRN Ajibola, Ene A, NP   25 mg at 04/29/23 1345   LORazepam (ATIVAN) tablet 2 mg  2 mg Oral TID PRN Ajibola, Ene A, NP       Or   LORazepam (ATIVAN) injection 2 mg  2 mg Intramuscular TID PRN Ajibola,  Ene A, NP       magnesium hydroxide (MILK OF MAGNESIA) suspension 30 mL  30 mL Oral Daily PRN Ajibola, Ene A, NP       mirtazapine (REMERON) tablet 7.5 mg  7.5 mg Oral QHS Cinthya Bors C, FNP   7.5 mg at 04/29/23 2124   nicotine (NICODERM CQ - dosed in mg/24 hours) patch 14 mg  14 mg Transdermal Daily Bobbitt, Shalon E, NP   14 mg at 04/30/23 0743   pantoprazole (PROTONIX) EC tablet 20 mg  20 mg Oral Daily Lyllian Gause C, FNP   20 mg at 04/30/23 0744   traZODone (DESYREL) tablet 50 mg  50 mg Oral QHS Danaysia Rader C, FNP   50 mg at 04/29/23 2124   Lab Results:  No results found for this or any previous visit (from the past 48 hour(s)).  Blood Alcohol level:  Lab Results  Component Value Date   ETH <10 04/27/2023   ETH <10 08/27/2018    Metabolic Disorder Labs: Lab Results  Component Value Date   HGBA1C 5.3 08/28/2018   MPG 105 08/28/2018   MPG 105 05/17/2017   Lab Results  Component Value Date   PROLACTIN 6.7 08/28/2018   PROLACTIN 17.3 (H) 05/17/2017   Lab Results  Component Value Date   CHOL 171 08/28/2018   TRIG 107 08/28/2018   HDL 46 08/28/2018   CHOLHDL  3.7 08/28/2018   VLDL 21 08/28/2018   LDLCALC 104 (H) 08/28/2018   LDLCALC 128 (H) 05/17/2017   Physical Findings: AIMS:  , ,  ,  ,    CIWA:    COWS:     Musculoskeletal: Strength & Muscle Tone: within normal limits Gait & Station: normal Patient leans: N/A  Psychiatric Specialty Exam:  Presentation  General Appearance:  Bizarre; Casual (Improving had a shower and change of clothing.)  Eye Contact: Fair  Speech: Clear and Coherent; Normal Rate  Speech Volume: Normal  Handedness: Right  Mood and Affect  Mood: Anxious; Depressed (Improving)  Affect: Congruent  Thought Process  Thought Processes: Coherent; Linear  Descriptions of Associations:Intact  Orientation:Full (Time, Place and Person)  Thought Content:Logical  History of Schizophrenia/Schizoaffective disorder:No  Duration of Psychotic Symptoms:No data recorded Hallucinations:Hallucinations: None  Ideas of Reference:None  Suicidal Thoughts:Suicidal Thoughts: No  Homicidal Thoughts:Homicidal Thoughts: No  Sensorium  Memory: Immediate Fair; Recent Fair  Judgment: Fair  Insight: Shallow  Executive Functions  Concentration: Fair  Attention Span: Fair  Recall: Fair  Fund of Knowledge: Fair  Language: Good  Psychomotor Activity  Psychomotor Activity: Psychomotor Activity: Normal  Assets  Assets: Communication Skills; Desire for Improvement; Physical Health; Resilience  Sleep  Sleep: Sleep: Good Number of Hours of Sleep: 10  Physical Exam: Physical Exam Vitals and nursing note reviewed.  Constitutional:      Appearance: He is ill-appearing and toxic-appearing.  HENT:     Head: Normocephalic.     Nose: Nose normal.     Mouth/Throat:     Mouth: Mucous membranes are moist.  Eyes:     Pupils: Pupils are equal, round, and reactive to light.  Cardiovascular:     Rate and Rhythm: Normal rate.     Pulses: Normal pulses.  Pulmonary:     Effort: Pulmonary effort is  normal.  Abdominal:     Comments: deferred  Genitourinary:    Comments: deferred Musculoskeletal:        General: Normal range of motion.     Cervical back: Normal  range of motion.  Skin:    General: Skin is warm.  Neurological:     Mental Status: He is alert and oriented to person, place, and time.  Psychiatric:        Behavior: Behavior normal.     Comments: Had a shower and change of clothing    Review of Systems  Constitutional:  Negative for fever.  HENT:         Hair unkempt and disheveled  Eyes: Negative.   Respiratory:  Negative for shortness of breath.   Cardiovascular:  Negative for chest pain.  Gastrointestinal:  Negative for nausea and vomiting.  Genitourinary: Negative.   Musculoskeletal: Negative.   Skin: Negative.        Superficial intentional self-inflicted wounds to right lateral forearm healing  Neurological: Negative.   Endo/Heme/Allergies:        See allergy listing  Psychiatric/Behavioral:  Positive for depression and substance abuse. The patient is nervous/anxious and has insomnia.    Blood pressure 121/76, pulse 79, temperature (!) 97.5 F (36.4 C), resp. rate 16, height 5\' 8"  (1.727 m), weight 67.2 kg, SpO2 97 %. Body mass index is 22.51 kg/m.  Treatment Plan Summary: Daily contact with patient to assess and evaluate symptoms and progress in treatment and Medication management  Labs: EKG, Hemoglobin A1c and lipid panel ordered 04/30/2023  Physician Treatment Plan for Primary Diagnosis:  Assessment MDD (major depressive disorder), recurrent severe, without psychosis (HCC)   Plan: Medications: Increase Fluoxetine to 40 mg from 20 mg p.o. daily for depression starting tomorrow 05/01/2023 Continue Abilify 5 mg p.o. daily for mood stabilization Continue Remeron 7.5 mg p.o. q. nightly for depression and sleep Continue hydroxyzine 25 mg p.o. 3 times daily as needed for anxiety Continue trazodone 50 mg p.o. nightly as needed for insomnia   Other  medical issues: Nicotine NicoDerm CQ-dose in milligrams/24 hours patch 14 mg transdermal over 24 hours daily for smoking cessation Protonix 20 mg p.o. daily for GERD Ensure Plus feeding supplement twice daily to improve nutrition  Agitation protocol: Benadryl capsule 50 mg p.o. 3 times daily as needed agitation or Benadryl injection 50 mg IM 3 times daily as needed agitation   Haldol tablets 5 mg po 3 times daily as needed agitation or Haldol lactate injection 5 mg IM 3 times daily as needed agitation   Lorazepam tablet 2 mg p.o. 3 times daily as needed agitation or Lorazepam injection 2 mg IM 3 times daily as needed agitation   Other PRN Medications -Acetaminophen 650 mg every 6 as needed/mild pain -Maalox 30 mL oral every 4 as needed/digestion -Magnesium hydroxide 30 mL daily as needed/mild constipation   -- The risks/benefits/side-effects/alternatives to this medication were discussed in detail with the patient and time was given for questions. The patient consents to medication trial.              -- Encouraged patient to participate  in unit milieu and in scheduled group therapies    Safety and Monitoring: Voluntary admission to inpatient psychiatric unit for safety, stabilization and treatment Daily contact with patient to assess and evaluate symptoms and progress in treatment Patient's case to be discussed in multi-disciplinary team meeting Observation Level : q15 minute checks Vital signs: q12 hours Precautions: suicide, but pt currently verbally contracts for safety on unit    Discharge Planning: Social work and case management to assist with discharge planning and identification of hospital follow-up needs prior to discharge Estimated LOS: 5-7 days Discharge Concerns: Need  to establish a safety plan; Medication compliance and effectiveness Discharge Goals: Return home with outpatient referrals for mental health follow-up including medication management/psychotherapy.    Long Term Goal(s): Improvement in symptoms so as ready for discharge   Short Term Goals: Ability to identify changes in lifestyle to reduce recurrence of condition will improve, Ability to verbalize feelings will improve, Ability to disclose and discuss suicidal ideas, Ability to demonstrate self-control will improve, Ability to identify and develop effective coping behaviors will improve, Ability to maintain clinical measurements within normal limits will improve, Compliance with prescribed medications will improve, and Ability to identify triggers associated with substance abuse/mental health issues will improve   Physician Treatment Plan for Secondary Diagnosis: Principal Problem:   MDD (major depressive disorder), recurrent severe, without psychosis (HCC)   Cecilie Lowers, FNP 04/30/2023, 5:44 PMPatient ID: Rosalie Doctor, male   DOB: [birth year], 42 y.o.   MRN: [id number]

## 2023-04-30 NOTE — Group Note (Signed)
Date:  04/30/2023 Time:  9:55 AM  Group Topic/Focus:  Goals Group:   The focus of this group is to help patients establish daily goals to achieve during treatment and discuss how the patient can incorporate goal setting into their daily lives to aide in recovery.    Participation Level:  Active  Participation Quality:  Appropriate  Affect:  Appropriate  Cognitive:  Appropriate  Insight: Appropriate  Engagement in Group:  Engaged  Modes of Intervention:  Discussion  Additional Comments:     Reymundo Poll 04/30/2023, 9:55 AM

## 2023-04-30 NOTE — Plan of Care (Signed)
  Problem: Education: Goal: Knowledge of [facility] Education information/materials will improve Outcome: Progressing   Problem: Education: Goal: Emotional status will improve Outcome: Progressing   Problem: Education: Goal: Utilization of techniques to improve thought processes will improve Outcome: Progressing   Problem: Safety: Goal: Ability to disclose and discuss suicidal ideas will improve Outcome: Progressing

## 2023-05-01 LAB — HEMOGLOBIN A1C
Hgb A1c MFr Bld: 5.3 % (ref 4.8–5.6)
Mean Plasma Glucose: 105 mg/dL

## 2023-05-01 MED ORDER — ARIPIPRAZOLE 2 MG PO TABS
2.0000 mg | ORAL_TABLET | Freq: Every day | ORAL | Status: DC
  Administered 2023-05-02: 2 mg via ORAL
  Filled 2023-05-01 (×2): qty 1

## 2023-05-01 NOTE — Plan of Care (Signed)
  Problem: Education: Goal: Knowledge of [facility] Education information/materials will improve Outcome: Progressing Goal: Emotional status will improve Outcome: Progressing Goal: Mental status will improve Outcome: Progressing Goal: Verbalization of understanding the information provided will improve Outcome: Progressing   Problem: Activity: Goal: Interest or engagement in activities will improve Outcome: Progressing Goal: Sleeping patterns will improve Outcome: Progressing   Problem: Coping: Goal: Ability to verbalize frustrations and anger appropriately will improve Outcome: Progressing Goal: Ability to demonstrate self-control will improve Outcome: Progressing   Problem: Health Behavior/Discharge Planning: Goal: Identification of resources available to assist in meeting health care needs will improve Outcome: Progressing Goal: Compliance with treatment plan for underlying cause of condition will improve Outcome: Progressing   Problem: Physical Regulation: Goal: Ability to maintain clinical measurements within normal limits will improve Outcome: Progressing   Problem: Safety: Goal: Periods of time without injury will increase Outcome: Progressing   Problem: Education: Goal: Utilization of techniques to improve thought processes will improve Outcome: Progressing Goal: Knowledge of the prescribed therapeutic regimen will improve Outcome: Progressing   Problem: Activity: Goal: Interest or engagement in leisure activities will improve Outcome: Progressing Goal: Imbalance in normal sleep/wake cycle will improve Outcome: Progressing   Problem: Coping: Goal: Coping ability will improve Outcome: Progressing Goal: Will verbalize feelings Outcome: Progressing   Problem: Health Behavior/Discharge Planning: Goal: Ability to make decisions will improve Outcome: Progressing Goal: Compliance with therapeutic regimen will improve Outcome: Progressing    Problem: Role Relationship: Goal: Will demonstrate positive changes in social behaviors and relationships Outcome: Progressing   Problem: Safety: Goal: Ability to disclose and discuss suicidal ideas will improve Outcome: Progressing Goal: Ability to identify and utilize support systems that promote safety will improve Outcome: Progressing   Problem: Self-Concept: Goal: Will verbalize positive feelings about self Outcome: Progressing Goal: Level of anxiety will decrease Outcome: Progressing   Problem: Education: Goal: Ability to make informed decisions regarding treatment will improve Outcome: Progressing   Problem: Coping: Goal: Coping ability will improve Outcome: Progressing   Problem: Health Behavior/Discharge Planning: Goal: Identification of resources available to assist in meeting health care needs will improve Outcome: Progressing   Problem: Medication: Goal: Compliance with prescribed medication regimen will improve Outcome: Progressing   Problem: Self-Concept: Goal: Ability to disclose and discuss suicidal ideas will improve Outcome: Progressing Goal: Will verbalize positive feelings about self Outcome: Progressing   

## 2023-05-01 NOTE — Group Note (Signed)
Recreation Therapy Group Note   Group Topic:Animal Assisted Therapy   Group Date: 05/01/2023 Start Time: 0945 End Time: 1030 Facilitators: Firas Guardado-McCall, LRT,CTRS Location: [address] Dayroom   Animal-Assisted Activity (AAA) Program Checklist/Progress Notes Patient Eligibility Criteria Checklist & Daily Group note for Rec Tx Intervention  AAA/T Program Assumption of Risk Form signed by Patient/ or Parent Legal Guardian Yes  Patient is free of allergies or severe asthma Yes  Patient reports no fear of animals Yes  Patient reports no history of cruelty to animals Yes  Patient understands his/her participation is voluntary Yes  Patient washes hands before animal contact Yes  Patient washes hands after animal contact Yes   Affect/Mood: Appropriate   Participation Level: Engaged   Participation Quality: Independent   Behavior: Appropriate   Speech/Thought Process: Focused    Clinical Observations/Individualized Feedback: Patient attended session and interacted appropriately with therapy dog and peers. Patient asked appropriate questions about therapy dog and his training. Patient shared stories about their pets at home with group.    Plan: Continue to engage patient in RT group sessions 2-3x/week.   Angel [name], LRT,CTRS 05/01/2023 12:07 PM

## 2023-05-01 NOTE — Progress Notes (Signed)
Patient appears flat. Patient denies SI/HI/AVH. Pt reports anxiety is 5/10 and depression is 8/10. Pt reports good sleep and appetite. Pt is minimal and guarded. Patient complied with morning medication with no reported side effects. Patient remains safe on Q8min checks and contracts for safety.      05/01/23 0903  Psych Admission Type (Psych Patients Only)  Admission Status Voluntary  Psychosocial Assessment  Patient Complaints Anxiety;Depression  Eye Contact Avoids  Facial Expression Flat  Affect Anxious;Apprehensive  Speech Slow;Logical/coherent  Interaction Minimal;Guarded  Motor Activity Slow  Appearance/Hygiene Disheveled;Poor hygiene  Behavior Characteristics Cooperative;Anxious  Mood Depressed;Anxious  Thought Process  Coherency WDL  Content WDL  Delusions None reported or observed  Perception WDL  Hallucination None reported or observed  Judgment Poor  Confusion None  Danger to Self  Current suicidal ideation? Denies  Self-Injurious Behavior No self-injurious ideation or behavior indicators observed or expressed   Agreement Not to Harm Self Yes  Description of Agreement verbal  Danger to Others  Danger to Others None reported or observed

## 2023-05-01 NOTE — Progress Notes (Signed)
   05/01/23 2010  Psych Admission Type (Psych Patients Only)  Admission Status Voluntary  Psychosocial Assessment  Patient Complaints Anxiety;Depression  Eye Contact Brief  Facial Expression Flat  Affect Appropriate to circumstance;Apprehensive  Speech Slow;Logical/coherent  Interaction Guarded;Minimal  Motor Activity Slow  Appearance/Hygiene Disheveled;Poor hygiene  Behavior Characteristics Cooperative;Anxious  Mood Depressed;Anxious  Thought Process  Coherency WDL  Content WDL  Delusions None reported or observed  Perception WDL  Hallucination None reported or observed  Judgment Poor  Confusion None  Danger to Self  Current suicidal ideation? Denies  Self-Injurious Behavior No self-injurious ideation or behavior indicators observed or expressed   Agreement Not to Harm Self Yes  Description of Agreement Verbal  Danger to Others  Danger to Others None reported or observed

## 2023-05-01 NOTE — Progress Notes (Signed)
[facility] MD Progress Note  05/01/2023 1:01 PM Angel [name]  MRN:  [id number]  Principal Problem: MDD (major depressive disorder), recurrent severe, without psychosis (HCC) Diagnosis: Principal Problem:   MDD (major depressive disorder), recurrent severe, without psychosis (HCC)  Reason for admission:  Angel [name] "[name]" is a 42 year old Caucasian male with prior psychiatric history significant for major depressive disorder recurrent severe without psychotic features, generalized anxiety disorder and tobacco use disorder.  Patient presents voluntarily to [facility] from [facility] ED after stabilization for worsening depression resulting in suicidal ideations with episodes of multiple superficial self mutilation on both forearms and plan to hang self in the context of homelessness, lack of food and no family support.   24-hour chart Review: Past 24 hours of patient's chart was reviewed.  Patient is compliant with scheduled meds. Required Agitation PRNs: none As needed medications: None Per RN notes, no documented behavioral issues Documented sleep last 24 hours: 8.5  Today's assessment notes:  Patient was initially seen in the dayroom, no acute distress.   Denied side effects to increased prozac this AM. Reported sleep and appetite are good. His anxiety has improved, where he can go to the dayroom and self-soothe, although he does feel slightly on edge. Stated that his depression is also improving some, but reported that he still feels significantly depressed.  Denied active and passive SI/HI. Denied AVH, paranoia.   Discussed with patient possible medication changes and the risk, benefits, and side effects. He stated that he is agreeable to any medication changes.   Review of Systems  Respiratory:  Negative for shortness of breath.   Cardiovascular:  Negative for chest pain and palpitations.  Gastrointestinal:  Negative for abdominal pain,  constipation, diarrhea, nausea and vomiting.  Neurological:  Negative for dizziness, tremors and headaches.     Total Time spent with patient: see attending attestation  Past Psychiatric History: Previous Psych Diagnoses: MDD, anxiety, generalized anxiety disorder. Prior inpatient treatment: Admitted to behavioral health in 08/2018, for suicidal attempt, 03/09/2018 admitted to behavioral health for suicidal ideations, and 05/16/2017 admitted to behavioral health for suicidal ideation, 10/31/2013 admitted to behavioral health for suicidal ideation.  Reported being admitted to behavioral health in [place] but does not remember the year. Current/prior outpatient treatment: Admitted to [facility] in 2021. Prior rehab hx: Denies Psychotherapy hx: Yes History of suicide: 6 or 7 times History of homicide or aggression: Denies Psychiatric medication history: Yes, patient had trials of fluoxetine, Remeron, Abilify, for treatment of depression Psychiatric medication compliance history: Noncompliant due to financial difficulty Neuromodulation history: Denies  Current Psychiatrist: Denies Current therapist: Denies   Past Medical History:  Past Medical History:  Diagnosis Date   Anxiety    Depression    Migraine    History reviewed. No pertinent surgical history. Family History:  Family History  Problem Relation Age of Onset   Cancer Mother        brain   Diabetes Brother    Hypertension Brother    Family Psychiatric  History: See H&P  Social History:  Social History   Substance and Sexual Activity  Alcohol Use No     Social History   Substance and Sexual Activity  Drug Use Yes   Types: Marijuana    Social History   Socioeconomic History   Marital status: Single    Spouse name: Not on file   Number of children: Not on file   Years of education: Not on file  Highest education level: Not on file  Occupational History   Not on file  Tobacco Use   Smoking status: Every Day     Packs/day: 1.50    Years: 20.00    Additional pack years: 0.00    Total pack years: 30.00    Types: Cigarettes   Smokeless tobacco: Never  Vaping Use   Vaping Use: Never used  Substance and Sexual Activity   Alcohol use: No   Drug use: Yes    Types: Marijuana   Sexual activity: Not Currently    Birth control/protection: Condom  Other Topics Concern   Not on file  Social History Narrative   Not on file   Social Determinants of Health   Financial Resource Strain: Not on file  Food Insecurity: Food Insecurity Present (04/28/2023)   Hunger Vital Sign    Worried About Running Out of Food in the Last Year: Sometimes true    Ran Out of Food in the Last Year: Sometimes true  Transportation Needs: Unmet Transportation Needs (04/28/2023)   PRAPARE - Administrator, Civil Service (Medical): No    Lack of Transportation (Non-Medical): Yes  Physical Activity: Not on file  Stress: Not on file  Social Connections: Not on file    Current Medications: Current Facility-Administered Medications  Medication Dose Route Frequency Provider Last Rate Last Admin   acetaminophen (TYLENOL) tablet 650 mg  650 mg Oral Q6H PRN Ajibola, Ene A, NP       alum & mag hydroxide-simeth (MAALOX/MYLANTA) 200-200-20 MG/5ML suspension 30 mL  30 mL Oral Q4H PRN Ajibola, Ene A, NP       [START ON 05/02/2023] ARIPiprazole (ABILIFY) tablet 2 mg  2 mg Oral Daily Princess Bruins, DO       diphenhydrAMINE (BENADRYL) capsule 50 mg  50 mg Oral TID PRN Ajibola, Ene A, NP       Or   diphenhydrAMINE (BENADRYL) injection 50 mg  50 mg Intramuscular TID PRN Ajibola, Ene A, NP       feeding supplement (ENSURE ENLIVE / ENSURE PLUS) liquid 237 mL  237 mL Oral BID BM Ntuen, Tina C, FNP   237 mL at 04/29/23 1608   FLUoxetine (PROZAC) capsule 40 mg  40 mg Oral Daily Ntuen, Jesusita Oka, FNP   40 mg at 05/01/23 0758   haloperidol (HALDOL) tablet 5 mg  5 mg Oral TID PRN Ajibola, Ene A, NP       Or   haloperidol lactate (HALDOL)  injection 5 mg  5 mg Intramuscular TID PRN Ajibola, Ene A, NP       hydrOXYzine (ATARAX) tablet 25 mg  25 mg Oral TID PRN Ajibola, Ene A, NP   25 mg at 04/29/23 1345   LORazepam (ATIVAN) tablet 2 mg  2 mg Oral TID PRN Ajibola, Ene A, NP       Or   LORazepam (ATIVAN) injection 2 mg  2 mg Intramuscular TID PRN Ajibola, Ene A, NP       magnesium hydroxide (MILK OF MAGNESIA) suspension 30 mL  30 mL Oral Daily PRN Ajibola, Ene A, NP       mirtazapine (REMERON) tablet 7.5 mg  7.5 mg Oral QHS Ntuen, Tina C, FNP   7.5 mg at 04/30/23 2138   nicotine (NICODERM CQ - dosed in mg/24 hours) patch 14 mg  14 mg Transdermal Daily Bobbitt, Shalon E, NP   14 mg at 05/01/23 0800   pantoprazole (PROTONIX) EC tablet 20 mg  20 mg Oral Daily Ntuen, Jesusita Oka, FNP   20 mg at 05/01/23 0758   traZODone (DESYREL) tablet 50 mg  50 mg Oral QHS Cecilie Lowers, FNP   50 mg at 04/30/23 2138   Lab Results:  Results for orders placed or performed during the hospital encounter of 04/28/23 (from the past 48 hour(s))  Lipid panel     Status: None   Collection Time: 04/30/23  6:08 PM  Result Value Ref Range   Cholesterol 168 0 - 200 mg/dL   Triglycerides 161 <096 mg/dL   HDL 55 >04 mg/dL   Total CHOL/HDL Ratio 3.1 RATIO   VLDL 22 0 - 40 mg/dL   LDL Cholesterol 91 0 - 99 mg/dL    Comment:        Total Cholesterol/HDL:CHD Risk Coronary Heart Disease Risk Table                     Men   Women  1/2 Average Risk   3.4   3.3  Average Risk       5.0   4.4  2 X Average Risk   9.6   7.1  3 X Average Risk  23.4   11.0        Use the calculated Patient Ratio above and the CHD Risk Table to determine the patient's CHD Risk.        ATP III CLASSIFICATION (LDL):  <100     mg/dL   Optimal  540-981  mg/dL   Near or Above                    Optimal  130-159  mg/dL   Borderline  191-478  mg/dL   High  >295     mg/dL   Very High Performed at [facility], [address] [place], [place] [address]   Hemoglobin A1c      Status: None   Collection Time: 04/30/23  6:08 PM  Result Value Ref Range   Hgb A1c MFr Bld 5.3 4.8 - 5.6 %    Comment: (NOTE)         Prediabetes: 5.7 - 6.4         Diabetes: >6.4         Glycemic control for adults with diabetes: <7.0    Mean Plasma Glucose 105 mg/dL    Comment: (NOTE) Performed At: [facility] [address] [place], [place] [id number] Jolene Schimke MD EX:[id number]     Blood Alcohol level:  Lab Results  Component Value Date   [facility] <10 04/27/2023   ETH <10 08/27/2018    Metabolic Disorder Labs: Lab Results  Component Value Date   HGBA1C 5.3 04/30/2023   MPG 105 04/30/2023   MPG 105 08/28/2018   Lab Results  Component Value Date   PROLACTIN 6.7 08/28/2018   PROLACTIN 17.3 (H) 05/17/2017   Lab Results  Component Value Date   CHOL 168 04/30/2023   TRIG 108 04/30/2023   HDL 55 04/30/2023   CHOLHDL 3.1 04/30/2023   VLDL 22 04/30/2023   LDLCALC 91 04/30/2023   LDLCALC 104 (H) 08/28/2018   Physical Findings: AIMS:  , ,  ,  ,     Musculoskeletal: Strength & Muscle Tone: within normal limits Gait & Station: normal Patient leans: N/A  Psychiatric Specialty Exam:  Presentation  General Appearance:  Disheveled (showered)  Eye Contact: Fair  Speech: Clear and Coherent; Normal Rate  Speech  Volume: Normal  Handedness: Right  Mood and Affect  Mood: Depressed; Anxious  Affect: Appropriate; Congruent; Constricted  Thought Process  Thought Processes: Coherent; Goal Directed; Linear  Descriptions of Associations:Intact  Orientation:Full (Time, Place and Person)  Thought Content:Rumination  History of Schizophrenia/Schizoaffective disorder:No  Duration of Psychotic Symptoms:NA Hallucinations:Hallucinations: None  Ideas of Reference:None  Suicidal Thoughts:Suicidal Thoughts: No  Homicidal Thoughts:Homicidal Thoughts: No  Sensorium  Memory: Immediate Good  Judgment: Fair  Insight: Fair  Executive  Functions  Concentration: Good  Attention Span: Good  Recall: Good  Fund of Knowledge: Good  Language: Good  Psychomotor Activity  Psychomotor Activity: Psychomotor Activity: Normal  Assets  Assets: Communication Skills; Desire for Improvement; Resilience  Sleep  Sleep: Good Documented sleep last 24 hours: 8.5  Physical Exam: Physical Exam Vitals and nursing note reviewed.  Constitutional:      General: He is awake. He is not in acute distress.    Appearance: He is not ill-appearing or diaphoretic.  HENT:     Head: Normocephalic.  Pulmonary:     Effort: Pulmonary effort is normal.  Neurological:     General: No focal deficit present.     Mental Status: He is alert.     Gait: Gait normal.     Blood pressure 110/68, pulse 98, temperature 98.8 F (37.1 C), temperature source Oral, resp. rate 16, height 5\' 8"  (1.727 m), weight 67.2 kg, SpO2 98 %. Body mass index is 22.51 kg/m.  Treatment Plan Summary: Daily contact with patient to assess and evaluate symptoms and progress in treatment and Medication management  Physician Treatment Plan for Primary Diagnosis:  Assessment MDD (major depressive disorder), recurrent severe, without psychosis (HCC)   His depression and anxiety are improving, but still having residual sxs while on prozac with remeron and abilify for argumentation. He has tolerated all 3 rx well, however given patient is not depressed and is also receiving remeron for augmentation, will decrease abilify per below. His mood has not been labile and has not had any psychosis, may consider tapering patient off of it to avoid possible polypharmacy.  However, if patient opts to keep the abilify, recommend that abilify be the first of the medications that patient is weaned off of outpatient when his mood is stable and he is established with psychiatry.  Plan: Medications: Continued Fluoxetine to 40 mg p.o. daily for depression DECREASED Abilify 5 mg to 2 mg  p.o. daily Continue Remeron 7.5 mg p.o. q. nightly for depression and sleep Continue hydroxyzine 25 mg p.o. 3 times daily as needed for anxiety Continue trazodone 50 mg p.o. nightly as needed for insomnia   Other medical issues: Nicotine NicoDerm CQ-dose in milligrams/24 hours patch 14 mg transdermal over 24 hours daily for smoking cessation Protonix 20 mg p.o. daily for GERD Ensure Plus feeding supplement twice daily to improve nutrition  Agitation protocol: Benadryl capsule 50 mg p.o. 3 times daily as needed agitation or Benadryl injection 50 mg IM 3 times daily as needed agitation   Haldol tablets 5 mg po 3 times daily as needed agitation or Haldol lactate injection 5 mg IM 3 times daily as needed agitation   Lorazepam tablet 2 mg p.o. 3 times daily as needed agitation or Lorazepam injection 2 mg IM 3 times daily as needed agitation   Other PRN Medications -Acetaminophen 650 mg every 6 as needed/mild pain -Maalox 30 mL oral every 4 as needed/digestion -Magnesium hydroxide 30 mL daily as needed/mild constipation   -- The risks/benefits/side-effects/alternatives to  this medication were discussed in detail with the patient and time was given for questions. The patient consents to medication trial.              -- Encouraged patient to participate  in unit milieu and in scheduled group therapies    Safety and Monitoring: Voluntary admission to inpatient psychiatric unit for safety, stabilization and treatment Daily contact with patient to assess and evaluate symptoms and progress in treatment Patient's case to be discussed in multi-disciplinary team meeting Observation Level : q15 minute checks Vital signs: q12 hours Precautions: suicide, but pt currently verbally contracts for safety on unit    Discharge Planning: Social work and case management to assist with discharge planning and identification of hospital follow-up needs prior to discharge Estimated LOS: 5-7  days Discharge Concerns: Need to establish a safety plan; Medication compliance and effectiveness Discharge Goals: Return home with outpatient referrals for mental health follow-up including medication management/psychotherapy. Tentative DC: 6/6   Long Term Goal(s): Improvement in symptoms so as ready for discharge   Short Term Goals: Ability to identify changes in lifestyle to reduce recurrence of condition will improve, Ability to verbalize feelings will improve, Ability to disclose and discuss suicidal ideas, Ability to demonstrate self-control will improve, Ability to identify and develop effective coping behaviors will improve, Ability to maintain clinical measurements within normal limits will improve, Compliance with prescribed medications will improve, and Ability to identify triggers associated with substance abuse/mental health issues will improve   Physician Treatment Plan for Secondary Diagnosis: Principal Problem:   MDD (major depressive disorder), recurrent severe, without psychosis (HCC)   Princess Bruins, DO 05/01/2023, 1:01 PMPatient ID: Angel [name], male   DOB: Jun 06, 1981, 42 y.o.   MRN: [id number]

## 2023-05-01 NOTE — Group Note (Signed)
Date:  05/01/2023 Time:  2:14 PM  Group Topic/Focus:  Emotional Education:   The focus of this group is to discuss what feelings/emotions are, and how they are experienced.    Participation Level:  Active  Participation Quality:  Appropriate  Affect:  Appropriate  Cognitive:  Appropriate  Insight: Appropriate  Engagement in Group:  Engaged  Modes of Intervention:  Discussion and Support  Additional Comments:    Memory Dance Angel [name] 05/01/2023, 2:14 PM

## 2023-05-01 NOTE — Progress Notes (Signed)
[facility] MD Progress Note  05/01/2023 5:23 PM Angel [name]  MRN:  [id number]  Principal Problem: MDD (major depressive disorder), recurrent severe, without psychosis (HCC) Diagnosis: Principal Problem:   MDD (major depressive disorder), recurrent severe, without psychosis (HCC)  Reason for admission:  Angel [name]. Angel "[name]" is a 42 year old Caucasian male with prior psychiatric history significant for major depressive disorder recurrent severe without psychotic features, generalized anxiety disorder and tobacco use disorder.  Patient presents voluntarily to [facility] from [facility] ED after stabilization for worsening depression resulting in suicidal ideations with episodes of multiple superficial self mutilation on both forearms and plan to hang self in the context of homelessness, lack of food and no family support.   24-hour chart Review: Past 24 hours of patient's chart was reviewed.  Patient is compliant with scheduled meds. Required Agitation PRNs: none As needed medications: None Per RN notes, no documented behavioral issues, however not attending therapeutic milieu and group activities. Patient slept, 8 hours  Today's assessment notes:  He presents alert, oriented to person, place, & situation.  Observed with clean but deck-stained clothing, and reports having a shower today.  Hygiene improved, patient is not malodorous.  His hair remains unkempt, however more presentable today.  Complemented patient on his ADLs efforts.  Mood is depressed and anxious but improving. Affect is improving  & congruent with mood.  Continues today on Prozac 40 mg p.o. daily  for depression.  Speech is clear and coherent.  Able to maintain fair eye contact with this provider.  He reports anxiety of #6/10 and depression as #6/10, with 10 being of highest severity.  He continues on his scheduled medications, Abilify 5 mg p.o. daily to augment antidepressant, Remeron 7.5 p.o. q.  nightly for depression and sleep.  Denies somatic discomfort.  Reports appetite is fair, Ensure plus feeding supplements twice daily continues. Superficial intentional self-inflicted wounds to bilateral forearms healing.  Patient visible on the unit and attending therapeutic milieu, group activities, and going to the cafeteria for meals.  Angel [name] does not appear to be responding to internal stimuli.  He denies SI, HI, or AVH.  Will continue current treatment plan as indicated below.  Vital signs within normal limits.  Hemoglobin A1c 5.3 and lipid panel within normal limits.  Total Time spent with patient: 30 minutes  Past Psychiatric History: Previous Psych Diagnoses: MDD, anxiety, generalized anxiety disorder. Prior inpatient treatment: Admitted to behavioral health in 08/2018, for suicidal attempt, 03/09/2018 admitted to behavioral health for suicidal ideations, and 05/16/2017 admitted to behavioral health for suicidal ideation, 10/31/2013 admitted to behavioral health for suicidal ideation.  Reported being admitted to behavioral health in [place] but does not remember the year. Current/prior outpatient treatment: Admitted to [facility] in 2021. Prior rehab hx: Denies Psychotherapy hx: Yes History of suicide: 6 or 7 times History of homicide or aggression: Denies Psychiatric medication history: Yes, patient had trials of fluoxetine, Remeron, Abilify, for treatment of depression Psychiatric medication compliance history: Noncompliant due to financial difficulty Neuromodulation history: Denies  Current Psychiatrist: Denies Current therapist: Denies   Past Medical History:  Past Medical History:  Diagnosis Date   Anxiety    Depression    Migraine    History reviewed. No pertinent surgical history. Family History:  Family History  Problem Relation Age of Onset   Cancer Mother        brain   Diabetes Brother    Hypertension Brother    Family Psychiatric  History:  See H&P  Social  History:  Social History   Substance and Sexual Activity  Alcohol Use No     Social History   Substance and Sexual Activity  Drug Use Yes   Types: Marijuana    Social History   Socioeconomic History   Marital status: Single    Spouse name: Not on file   Number of children: Not on file   Years of education: Not on file   Highest education level: Not on file  Occupational History   Not on file  Tobacco Use   Smoking status: Every Day    Packs/day: 1.50    Years: 20.00    Additional pack years: 0.00    Total pack years: 30.00    Types: Cigarettes   Smokeless tobacco: Never  Vaping Use   Vaping Use: Never used  Substance and Sexual Activity   Alcohol use: No   Drug use: Yes    Types: Marijuana   Sexual activity: Not Currently    Birth control/protection: Condom  Other Topics Concern   Not on file  Social History Narrative   Not on file   Social Determinants of Health   Financial Resource Strain: Not on file  Food Insecurity: Food Insecurity Present (04/28/2023)   Hunger Vital Sign    Worried About Running Out of Food in the Last Year: Sometimes true    Ran Out of Food in the Last Year: Sometimes true  Transportation Needs: Unmet Transportation Needs (04/28/2023)   PRAPARE - Administrator, Civil Service (Medical): No    Lack of Transportation (Non-Medical): Yes  Physical Activity: Not on file  Stress: Not on file  Social Connections: Not on file   Additional Social History:    Sleep: Good  Appetite:  Fair  Current Medications: Current Facility-Administered Medications  Medication Dose Route Frequency Provider Last Rate Last Admin   acetaminophen (TYLENOL) tablet 650 mg  650 mg Oral Q6H PRN Ajibola, Ene A, NP       alum & mag hydroxide-simeth (MAALOX/MYLANTA) 200-200-20 MG/5ML suspension 30 mL  30 mL Oral Q4H PRN Ajibola, Ene A, NP       [START ON 05/02/2023] ARIPiprazole (ABILIFY) tablet 2 mg  2 mg Oral Daily Princess Bruins, DO        diphenhydrAMINE (BENADRYL) capsule 50 mg  50 mg Oral TID PRN Ajibola, Ene A, NP       Or   diphenhydrAMINE (BENADRYL) injection 50 mg  50 mg Intramuscular TID PRN Ajibola, Ene A, NP       feeding supplement (ENSURE ENLIVE / ENSURE PLUS) liquid 237 mL  237 mL Oral BID BM Rhylan Gross C, FNP   237 mL at 04/29/23 1608   FLUoxetine (PROZAC) capsule 40 mg  40 mg Oral Daily Katelin Kutsch, Jesusita Oka, FNP   40 mg at 05/01/23 0758   haloperidol (HALDOL) tablet 5 mg  5 mg Oral TID PRN Ajibola, Ene A, NP       Or   haloperidol lactate (HALDOL) injection 5 mg  5 mg Intramuscular TID PRN Ajibola, Ene A, NP       hydrOXYzine (ATARAX) tablet 25 mg  25 mg Oral TID PRN Ajibola, Ene A, NP   25 mg at 04/29/23 1345   LORazepam (ATIVAN) tablet 2 mg  2 mg Oral TID PRN Ajibola, Ene A, NP       Or   LORazepam (ATIVAN) injection 2 mg  2 mg Intramuscular TID PRN Ajibola, Ene  A, NP       magnesium hydroxide (MILK OF MAGNESIA) suspension 30 mL  30 mL Oral Daily PRN Ajibola, Ene A, NP       mirtazapine (REMERON) tablet 7.5 mg  7.5 mg Oral QHS Mayrene Bastarache, Jesusita Oka, FNP   7.5 mg at 04/30/23 2138   nicotine (NICODERM CQ - dosed in mg/24 hours) patch 14 mg  14 mg Transdermal Daily Bobbitt, Shalon E, NP   14 mg at 05/01/23 0800   pantoprazole (PROTONIX) EC tablet 20 mg  20 mg Oral Daily Lakishia Bourassa, Jesusita Oka, FNP   20 mg at 05/01/23 0758   traZODone (DESYREL) tablet 50 mg  50 mg Oral QHS Cecilie Lowers, FNP   50 mg at 04/30/23 2138   Lab Results:  Results for orders placed or performed during the hospital encounter of 04/28/23 (from the past 48 hour(s))  Lipid panel     Status: None   Collection Time: 04/30/23  6:08 PM  Result Value Ref Range   Cholesterol 168 0 - 200 mg/dL   Triglycerides 161 <096 mg/dL   HDL 55 >04 mg/dL   Total CHOL/HDL Ratio 3.1 RATIO   VLDL 22 0 - 40 mg/dL   LDL Cholesterol 91 0 - 99 mg/dL    Comment:        Total Cholesterol/HDL:CHD Risk Coronary Heart Disease Risk Table                     Men   Women  1/2 Average Risk    3.4   3.3  Average Risk       5.0   4.4  2 X Average Risk   9.6   7.1  3 X Average Risk  23.4   11.0        Use the calculated Patient Ratio above and the CHD Risk Table to determine the patient's CHD Risk.        ATP III CLASSIFICATION (LDL):  <100     mg/dL   Optimal  540-981  mg/dL   Near or Above                    Optimal  130-159  mg/dL   Borderline  191-478  mg/dL   High  >295     mg/dL   Very High Performed at [facility], [address], [place], [place] [address]   Hemoglobin A1c     Status: None   Collection Time: 04/30/23  6:08 PM  Result Value Ref Range   Hgb A1c MFr Bld 5.3 4.8 - 5.6 %    Comment: (NOTE)         Prediabetes: 5.7 - 6.4         Diabetes: >6.4         Glycemic control for adults with diabetes: <7.0    Mean Plasma Glucose 105 mg/dL    Comment: (NOTE) Performed At: [facility] [address] [place], [place] [id number] Jolene Schimke MD EX:[id number]     Blood Alcohol level:  Lab Results  Component Value Date   [facility] <10 04/27/2023   ETH <10 08/27/2018    Metabolic Disorder Labs: Lab Results  Component Value Date   HGBA1C 5.3 04/30/2023   MPG 105 04/30/2023   MPG 105 08/28/2018   Lab Results  Component Value Date   PROLACTIN 6.7 08/28/2018   PROLACTIN 17.3 (H) 05/17/2017   Lab Results  Component Value Date  CHOL 168 04/30/2023   TRIG 108 04/30/2023   HDL 55 04/30/2023   CHOLHDL 3.1 04/30/2023   VLDL 22 04/30/2023   LDLCALC 91 04/30/2023   LDLCALC 104 (H) 08/28/2018   Physical Findings: AIMS:  , ,  ,  ,    CIWA:    COWS:     Musculoskeletal: Strength & Muscle Tone: within normal limits Gait & Station: normal Patient leans: N/A  Psychiatric Specialty Exam:  Presentation  General Appearance:  Disheveled (showered)  Eye Contact: Fair  Speech: Clear and Coherent; Normal Rate  Speech Volume: Normal  Handedness: Right  Mood and Affect  Mood: Depressed;  Anxious  Affect: Appropriate; Congruent; Constricted  Thought Process  Thought Processes: Coherent; Goal Directed; Linear  Descriptions of Associations:Intact  Orientation:Full (Time, Place and Person)  Thought Content:Rumination  History of Schizophrenia/Schizoaffective disorder:No  Duration of Psychotic Symptoms:No data recorded Hallucinations:Hallucinations: None  Ideas of Reference:None  Suicidal Thoughts:Suicidal Thoughts: No  Homicidal Thoughts:Homicidal Thoughts: No  Sensorium  Memory: Immediate Good  Judgment: Fair  Insight: Fair  Executive Functions  Concentration: Good  Attention Span: Good  Recall: Good  Fund of Knowledge: Good  Language: Good  Psychomotor Activity  Psychomotor Activity: Psychomotor Activity: Normal  Assets  Assets: Communication Skills; Desire for Improvement; Resilience  Sleep  Sleep: Sleep: Good Number of Hours of Sleep: 8  Physical Exam: Physical Exam Vitals and nursing note reviewed.  Constitutional:      Appearance: He is ill-appearing. He is not toxic-appearing.  HENT:     Head: Normocephalic.     Nose: Nose normal.     Mouth/Throat:     Mouth: Mucous membranes are moist.  Eyes:     Pupils: Pupils are equal, round, and reactive to light.  Cardiovascular:     Rate and Rhythm: Normal rate.     Pulses: Normal pulses.  Pulmonary:     Effort: Pulmonary effort is normal.  Abdominal:     Comments: deferred  Genitourinary:    Comments: deferred Musculoskeletal:        General: Normal range of motion.     Cervical back: Normal range of motion.  Skin:    General: Skin is warm.  Neurological:     Mental Status: He is alert and oriented to person, place, and time.  Psychiatric:        Behavior: Behavior normal.     Comments: Had a shower and change of clothing    Review of Systems  Constitutional:  Negative for fever.  HENT:         Hair unkempt and disheveled  Eyes: Negative.   Respiratory:   Negative for shortness of breath.   Cardiovascular:  Negative for chest pain.  Gastrointestinal:  Negative for nausea and vomiting.  Genitourinary: Negative.   Musculoskeletal: Negative.   Skin: Negative.        Superficial intentional self-inflicted wounds to right lateral forearm healing  Neurological: Negative.   Endo/Heme/Allergies:        See allergy listing  Psychiatric/Behavioral:  Positive for depression and substance abuse. The patient is nervous/anxious and has insomnia.    Blood pressure 110/68, pulse 98, temperature 98.8 F (37.1 C), temperature source Oral, resp. rate 16, height 5\' 8"  (1.727 m), weight 67.2 kg, SpO2 98 %. Body mass index is 22.51 kg/m.  Treatment Plan Summary: Daily contact with patient to assess and evaluate symptoms and progress in treatment and Medication management  Labs: EKG, Hemoglobin A1c: 5.3; and lipid panel within normal  limits.  Physician Treatment Plan for Primary Diagnosis:  Assessment MDD (major depressive disorder), recurrent severe, without psychosis (HCC)   Plan: Medications: Continue fluoxetine 40 mg p.o. daily for depression  Continue Abilify 5 mg p.o. daily for mood stabilization Continue Remeron 7.5 mg p.o. q. nightly for depression and sleep Continue hydroxyzine 25 mg p.o. 3 times daily as needed for anxiety Continue trazodone 50 mg p.o. nightly as needed for insomnia   Other medical issues: Nicotine NicoDerm CQ-dose in milligrams/24 hours patch 14 mg transdermal over 24 hours daily for smoking cessation Protonix 20 mg p.o. daily for GERD Ensure Plus feeding supplement twice daily to improve nutrition  Agitation protocol: Benadryl capsule 50 mg p.o. 3 times daily as needed agitation or Benadryl injection 50 mg IM 3 times daily as needed agitation   Haldol tablets 5 mg po 3 times daily as needed agitation or Haldol lactate injection 5 mg IM 3 times daily as needed agitation   Lorazepam tablet 2 mg p.o. 3 times daily as  needed agitation or Lorazepam injection 2 mg IM 3 times daily as needed agitation   Other PRN Medications -Acetaminophen 650 mg every 6 as needed/mild pain -Maalox 30 mL oral every 4 as needed/digestion -Magnesium hydroxide 30 mL daily as needed/mild constipation   -- The risks/benefits/side-effects/alternatives to this medication were discussed in detail with the patient and time was given for questions. The patient consents to medication trial.              -- Encouraged patient to participate  in unit milieu and in scheduled group therapies    Safety and Monitoring: Voluntary admission to inpatient psychiatric unit for safety, stabilization and treatment Daily contact with patient to assess and evaluate symptoms and progress in treatment Patient's case to be discussed in multi-disciplinary team meeting Observation Level : q15 minute checks Vital signs: q12 hours Precautions: suicide, but pt currently verbally contracts for safety on unit    Discharge Planning: Social work and case management to assist with discharge planning and identification of hospital follow-up needs prior to discharge Estimated LOS: 5-7 days Discharge Concerns: Need to establish a safety plan; Medication compliance and effectiveness Discharge Goals: Return home with outpatient referrals for mental health follow-up including medication management/psychotherapy.   Long Term Goal(s): Improvement in symptoms so as ready for discharge   Short Term Goals: Ability to identify changes in lifestyle to reduce recurrence of condition will improve, Ability to verbalize feelings will improve, Ability to disclose and discuss suicidal ideas, Ability to demonstrate self-control will improve, Ability to identify and develop effective coping behaviors will improve, Ability to maintain clinical measurements within normal limits will improve, Compliance with prescribed medications will improve, and Ability to identify triggers  associated with substance abuse/mental health issues will improve   Physician Treatment Plan for Secondary Diagnosis: Principal Problem:   MDD (major depressive disorder), recurrent severe, without psychosis (HCC)   Cecilie Lowers, FNP 05/01/2023, 5:23 PMPatient ID: Angel [name], male   DOB: 1981-08-27, 42 y.o.   MRN: [id number] Patient ID: Angel [name], male   DOB: March 05, 1981, 42 y.o.   MRN: [id number]

## 2023-05-01 NOTE — Progress Notes (Signed)
   04/30/23 2100  Psych Admission Type (Psych Patients Only)  Admission Status Voluntary  Psychosocial Assessment  Patient Complaints Anxiety;Depression  Eye Contact Brief  Facial Expression Flat  Affect Appropriate to circumstance  Speech Slow  Interaction Minimal  Motor Activity Slow  Appearance/Hygiene Disheveled  Behavior Characteristics Appropriate to situation  Mood Depressed;Anxious  Thought Process  Coherency WDL  Content WDL  Delusions None reported or observed  Perception WDL  Hallucination None reported or observed  Judgment Poor  Confusion None  Danger to Self  Current suicidal ideation? Denies  Self-Injurious Behavior No self-injurious ideation or behavior indicators observed or expressed   Agreement Not to Harm Self Yes  Description of Agreement Verbal contract  Danger to Others  Danger to Others None reported or observed

## 2023-05-01 NOTE — Progress Notes (Signed)
   05/01/23 0600  15 Minute Checks  Location Bedroom  Visual Appearance Calm  Behavior Sleeping  Sleep (Behavioral Health Patients Only)  Calculate sleep? (Click Yes once per 24 hr at 0600 safety check) Yes  Documented sleep last 24 hours 8.5

## 2023-05-01 NOTE — [facility] Group Notes (Signed)
Spiritual care group on grief and loss facilitated by Chaplain Dyanne Carrel, [facility]  Group Goal: Support / Education around grief and loss  Members engage in facilitated group support and psycho-social education.  Group Description:  Following introductions and group rules, group members engaged in facilitated group dialogue and support around topic of loss, with particular support around experiences of loss in their lives. Group Identified types of loss (relationships / self / things) and identified patterns, circumstances, and changes that precipitate losses. Reflected on thoughts / feelings around loss, normalized grief responses, and recognized variety in grief experience. Group encouraged individual reflection on safe space and on the coping skills that they are already utilizing.  Group drew on Adlerian / Rogerian and narrative framework  Patient Progress: Angel [name] attended group and participated in group activities.  Though verbal comments were minimal, [name] remained engaged for the duration of the session.

## 2023-05-01 NOTE — Plan of Care (Signed)
  Problem: Education: Goal: Knowledge of [facility] Education information/materials will improve Outcome: Progressing   Problem: Health Behavior/Discharge Planning: Goal: Identification of resources available to assist in meeting health care needs will improve Outcome: Progressing   Problem: Safety: Goal: Periods of time without injury will increase Outcome: Progressing   

## 2023-05-01 NOTE — Group Note (Signed)
Date:  05/01/2023 Time:  9:55 AM  Group Topic/Focus:  Goals Group:   The focus of this group is to help patients establish daily goals to achieve during treatment and discuss how the patient can incorporate goal setting into their daily lives to aide in recovery.    Participation Level:  Active  Participation Quality:  Appropriate  Affect:  Appropriate  Cognitive:  Appropriate  Insight: Good  Engagement in Group:  Engaged  Modes of Intervention:  Discussion  Additional Comments:     Reymundo Poll 05/01/2023, 9:55 AM

## 2023-05-02 MED ORDER — TRAZODONE HCL 50 MG PO TABS: 50.0000 mg | ORAL_TABLET | Freq: Every evening | ORAL | Status: DC | PRN

## 2023-05-02 NOTE — [facility] Group Notes (Signed)
[facility] Group Notes:  (Nursing/MHT/Case Management/Adjunct)  Date:  05/02/2023  Time:  8:19 PM  Type of Therapy:   NA Group  Participation Level:  Active  Participation Quality:  Appropriate  Affect:  Appropriate  Cognitive:  Appropriate  Insight:  Appropriate  Engagement in Group:  Engaged  Modes of Intervention:  Education  Summary of Progress/Problems: Attended NA meeting.  Angel [name] 05/02/2023, 8:19 PM

## 2023-05-02 NOTE — Progress Notes (Signed)
[facility] MD Progress Note  05/02/2023 8:00 PM Angel [name]  MRN:  [id number]  Principal Problem: MDD (major depressive disorder), recurrent severe, without psychosis (HCC) Diagnosis: Principal Problem:   MDD (major depressive disorder), recurrent severe, without psychosis (HCC)  Reason for admission:  Angel [name]. Baggarly "Angel [name]" is a 42 year old Caucasian male with prior psychiatric history significant for major depressive disorder recurrent severe without psychotic features, generalized anxiety disorder and tobacco use disorder.  Patient presents voluntarily to [facility] from [facility] ED after stabilization for worsening depression resulting in suicidal ideations with episodes of multiple superficial self mutilation on both forearms and plan to hang self in the context of homelessness, lack of food and no family support.   24-hour chart Review: Past 24 hours of patient's chart was reviewed.  Patient is compliant with scheduled meds. Required Agitation PRNs: none Per RN notes, no documented behavioral issues Documented sleep last 24 hours: 9.25  Today's assessment notes:  Patient was initially seen in the dayroom, no acute distress.   Depression and anxiety still present but continues to improve. He doesn't notice difference in mood after decreasing abilify, and is agreeable to discontinuing abilify all together.   Reported difficulties with maintaining sleep due to bizarre dreams. Instructed to remember removing nic patch. Discussed that trazodone can also cause nightmares, which shared decision of making trazodone prn rather than scheduled.   Appetite continues to improve, does not feel like he needs ensures and wants to dc them.  Denied active and passive SI/HI. Denied AVH, paranoia.   Discussed with patient possible medication changes and the risk, benefits, and side effects. He stated that he is agreeable to any medication changes.   Patient  reported feeling confident and ready for dc tomorrow to his friends place, will be picked up by friend.   Review of Systems  Respiratory:  Negative for shortness of breath.   Cardiovascular:  Negative for chest pain and palpitations.  Gastrointestinal:  Negative for abdominal pain, constipation, diarrhea, nausea and vomiting.  Neurological:  Negative for dizziness, tremors and headaches.     Total Time spent with patient: see attending attestation  Past Psychiatric History: Previous Psych Diagnoses: MDD, anxiety, generalized anxiety disorder. Prior inpatient treatment: Admitted to behavioral health in 08/2018, for suicidal attempt, 03/09/2018 admitted to behavioral health for suicidal ideations, and 05/16/2017 admitted to behavioral health for suicidal ideation, 10/31/2013 admitted to behavioral health for suicidal ideation.  Reported being admitted to behavioral health in [place] but does not remember the year. Current/prior outpatient treatment: Admitted to [facility] in 2021. Prior rehab hx: Denies Psychotherapy hx: Yes History of suicide: 6 or 7 times History of homicide or aggression: Denies Psychiatric medication history: Yes, patient had trials of fluoxetine, Remeron, Abilify, for treatment of depression Psychiatric medication compliance history: Noncompliant due to financial difficulty Neuromodulation history: Denies  Current Psychiatrist: Denies Current therapist: Denies   Past Medical History:  Past Medical History:  Diagnosis Date   Anxiety    Depression    Migraine    History reviewed. No pertinent surgical history. Family History:  Family History  Problem Relation Age of Onset   Cancer Mother        brain   Diabetes Brother    Hypertension Brother    Family Psychiatric  History: See H&P  Social History:  Social History   Substance and Sexual Activity  Alcohol Use No     Social History   Substance and Sexual Activity  Drug  Use Yes   Types: Marijuana     Social History   Socioeconomic History   Marital status: Single    Spouse name: Not on file   Number of children: Not on file   Years of education: Not on file   Highest education level: Not on file  Occupational History   Not on file  Tobacco Use   Smoking status: Every Day    Packs/day: 1.50    Years: 20.00    Additional pack years: 0.00    Total pack years: 30.00    Types: Cigarettes   Smokeless tobacco: Never  Vaping Use   Vaping Use: Never used  Substance and Sexual Activity   Alcohol use: No   Drug use: Yes    Types: Marijuana   Sexual activity: Not Currently    Birth control/protection: Condom  Other Topics Concern   Not on file  Social History Narrative   Not on file   Social Determinants of Health   Financial Resource Strain: Not on file  Food Insecurity: Food Insecurity Present (04/28/2023)   Hunger Vital Sign    Worried About Running Out of Food in the Last Year: Sometimes true    Ran Out of Food in the Last Year: Sometimes true  Transportation Needs: Unmet Transportation Needs (04/28/2023)   PRAPARE - Administrator, Civil Service (Medical): No    Lack of Transportation (Non-Medical): Yes  Physical Activity: Not on file  Stress: Not on file  Social Connections: Not on file    Current Medications: Current Facility-Administered Medications  Medication Dose Route Frequency Provider Last Rate Last Admin   acetaminophen (TYLENOL) tablet 650 mg  650 mg Oral Q6H PRN Ajibola, Ene A, NP       alum & mag hydroxide-simeth (MAALOX/MYLANTA) 200-200-20 MG/5ML suspension 30 mL  30 mL Oral Q4H PRN Ajibola, Ene A, NP       diphenhydrAMINE (BENADRYL) capsule 50 mg  50 mg Oral TID PRN Ajibola, Ene A, NP       Or   diphenhydrAMINE (BENADRYL) injection 50 mg  50 mg Intramuscular TID PRN Ajibola, Ene A, NP       FLUoxetine (PROZAC) capsule 40 mg  40 mg Oral Daily Ntuen, Jesusita Oka, FNP   40 mg at 05/02/23 1610   haloperidol (HALDOL) tablet 5 mg  5 mg Oral TID PRN  Ajibola, Ene A, NP       Or   haloperidol lactate (HALDOL) injection 5 mg  5 mg Intramuscular TID PRN Ajibola, Ene A, NP       hydrOXYzine (ATARAX) tablet 25 mg  25 mg Oral TID PRN Ajibola, Ene A, NP   25 mg at 05/02/23 1111   LORazepam (ATIVAN) tablet 2 mg  2 mg Oral TID PRN Ajibola, Ene A, NP       Or   LORazepam (ATIVAN) injection 2 mg  2 mg Intramuscular TID PRN Ajibola, Ene A, NP       magnesium hydroxide (MILK OF MAGNESIA) suspension 30 mL  30 mL Oral Daily PRN Ajibola, Ene A, NP       mirtazapine (REMERON) tablet 7.5 mg  7.5 mg Oral QHS Ntuen, Tina C, FNP   7.5 mg at 05/01/23 2105   nicotine (NICODERM CQ - dosed in mg/24 hours) patch 14 mg  14 mg Transdermal Daily Bobbitt, Shalon E, NP   14 mg at 05/02/23 0802   pantoprazole (PROTONIX) EC tablet 20 mg  20 mg Oral Daily  Cecilie Lowers, FNP   20 mg at 05/02/23 3086   traZODone (DESYREL) tablet 50 mg  50 mg Oral QHS PRN Princess Bruins, DO       Lab Results:  No results found for this or any previous visit (from the past 48 hour(s)).   Blood Alcohol level:  Lab Results  Component Value Date   ETH <10 04/27/2023   ETH <10 08/27/2018    Metabolic Disorder Labs: Lab Results  Component Value Date   HGBA1C 5.3 04/30/2023   MPG 105 04/30/2023   MPG 105 08/28/2018   Lab Results  Component Value Date   PROLACTIN 6.7 08/28/2018   PROLACTIN 17.3 (H) 05/17/2017   Lab Results  Component Value Date   CHOL 168 04/30/2023   TRIG 108 04/30/2023   HDL 55 04/30/2023   CHOLHDL 3.1 04/30/2023   VLDL 22 04/30/2023   LDLCALC 91 04/30/2023   LDLCALC 104 (H) 08/28/2018   Physical Findings: AIMS:  , ,  ,  ,     Musculoskeletal: Strength & Muscle Tone: within normal limits Gait & Station: normal Patient leans: N/A  Psychiatric Specialty Exam:  Presentation  General Appearance:  Disheveled (showered)  Eye Contact: Fair  Speech: Clear and Coherent; Normal Rate  Speech Volume: Normal  Handedness: Right  Mood and Affect   Mood: Depressed; Anxious  Affect: Appropriate; Congruent; Constricted  Thought Process  Thought Processes: Coherent; Goal Directed; Linear  Descriptions of Associations:Intact  Orientation:Full (Time, Place and Person)  Thought Content:Rumination  History of Schizophrenia/Schizoaffective disorder:No  Duration of Psychotic Symptoms:NA Hallucinations:Hallucinations: None  Ideas of Reference:None  Suicidal Thoughts:Suicidal Thoughts: No  Homicidal Thoughts:Homicidal Thoughts: No  Sensorium  Memory: Immediate Good  Judgment: Fair  Insight: Fair  Executive Functions  Concentration: Good  Attention Span: Good  Recall: Good  Fund of Knowledge: Good  Language: Good  Psychomotor Activity  Psychomotor Activity: Psychomotor Activity: Normal  Assets  Assets: Communication Skills; Desire for Improvement; Resilience  Sleep  Sleep: Fair Documented sleep last 24 hours: 9.25  Physical Exam: Physical Exam Vitals and nursing note reviewed.  Constitutional:      General: He is awake. He is not in acute distress.    Appearance: He is not ill-appearing or diaphoretic.  HENT:     Head: Normocephalic.  Pulmonary:     Effort: Pulmonary effort is normal.  Neurological:     General: No focal deficit present.     Mental Status: He is alert.     Gait: Gait normal.     Blood pressure 119/75, pulse 82, temperature 98.6 F (37 C), temperature source Oral, resp. rate 16, height 5\' 8"  (1.727 m), weight 67.2 kg, SpO2 100 %. Body mass index is 22.51 kg/m.  Treatment Plan Summary: Daily contact with patient to assess and evaluate symptoms and progress in treatment and Medication management  Physician Treatment Plan for Primary Diagnosis:  Assessment MDD (major depressive disorder), recurrent severe, without psychosis (HCC)   His depression and anxiety are improving, but still having residual sxs while on prozac with remeron and abilify for argumentation. He  has tolerated all 3 rx well, however given patient is not depressed and is also receiving remeron for augmentation, will decrease abilify per below. His mood has not been labile and has not had any psychosis, may consider tapering patient off of it to avoid possible polypharmacy.  However, if patient opts to keep the abilify, recommend that abilify be the first of the medications that patient is weaned  off of outpatient when his mood is stable and he is established with psychiatry.  Plan: Medications: Continued Fluoxetine to 40 mg p.o. daily for depression DISCONTINUED Abilify 2 mg p.o. daily Continue Remeron 7.5 mg p.o. q. nightly for depression and sleep Continue hydroxyzine 25 mg p.o. 3 times daily as needed for anxiety CHANGED trazodone 50 mg p.o. nightly scheduled to as needed for insomnia   Other medical issues: Nicotine NicoDerm CQ-dose in milligrams/24 hours patch 14 mg transdermal over 24 hours daily for smoking cessation Protonix 20 mg p.o. daily for GERD Ensure Plus feeding supplement twice daily to improve nutrition  Agitation protocol: Benadryl capsule 50 mg p.o. 3 times daily as needed agitation or Benadryl injection 50 mg IM 3 times daily as needed agitation   Haldol tablets 5 mg po 3 times daily as needed agitation or Haldol lactate injection 5 mg IM 3 times daily as needed agitation   Lorazepam tablet 2 mg p.o. 3 times daily as needed agitation or Lorazepam injection 2 mg IM 3 times daily as needed agitation   Other PRN Medications -Acetaminophen 650 mg every 6 as needed/mild pain -Maalox 30 mL oral every 4 as needed/digestion -Magnesium hydroxide 30 mL daily as needed/mild constipation   -- The risks/benefits/side-effects/alternatives to this medication were discussed in detail with the patient and time was given for questions. The patient consents to medication trial.              -- Encouraged patient to participate  in unit milieu and in scheduled  group therapies    Safety and Monitoring: Voluntary admission to inpatient psychiatric unit for safety, stabilization and treatment Daily contact with patient to assess and evaluate symptoms and progress in treatment Patient's case to be discussed in multi-disciplinary team meeting Observation Level : q15 minute checks Vital signs: q12 hours Precautions: suicide, but pt currently verbally contracts for safety on unit    Discharge Planning: Social work and case management to assist with discharge planning and identification of hospital follow-up needs prior to discharge Estimated LOS: 5-7 days Discharge Concerns: Need to establish a safety plan; Medication compliance and effectiveness Discharge Goals: Return home with outpatient referrals for mental health follow-up including medication management/psychotherapy. Tentative DC: 6/6   Long Term Goal(s): Improvement in symptoms so as ready for discharge   Short Term Goals: Ability to identify changes in lifestyle to reduce recurrence of condition will improve, Ability to verbalize feelings will improve, Ability to disclose and discuss suicidal ideas, Ability to demonstrate self-control will improve, Ability to identify and develop effective coping behaviors will improve, Ability to maintain clinical measurements within normal limits will improve, Compliance with prescribed medications will improve, and Ability to identify triggers associated with substance abuse/mental health issues will improve   Physician Treatment Plan for Secondary Diagnosis: Principal Problem:   MDD (major depressive disorder), recurrent severe, without psychosis (HCC)   Princess Bruins, DO 05/02/2023, 8:00 PMPatient ID: Rosalie Doctor, male   DOB: 22-Apr-1981, 42 y.o.   MRN: [id number]

## 2023-05-02 NOTE — Progress Notes (Signed)
   05/02/23 1000  Psych Admission Type (Psych Patients Only)  Admission Status Voluntary  Psychosocial Assessment  Patient Complaints Depression  Eye Contact Brief  Facial Expression Flat  Affect Appropriate to circumstance;Apprehensive  Speech Logical/coherent;Slow  Interaction Guarded;Minimal  Motor Activity Slow  Appearance/Hygiene Disheveled  Behavior Characteristics Cooperative  Mood Depressed  Thought Process  Coherency WDL  Content WDL  Delusions None reported or observed  Perception WDL  Hallucination None reported or observed  Judgment Poor  Confusion None  Danger to Self  Current suicidal ideation? Denies  Self-Injurious Behavior No self-injurious ideation or behavior indicators observed or expressed   Agreement Not to Harm Self Yes  Description of Agreement verbal  Danger to Others  Danger to Others None reported or observed

## 2023-05-02 NOTE — Group Note (Unsigned)
Date:  05/02/2023 Time:  6:22 PM  Group Topic/Focus:  Wellness Toolbox:   The focus of this group is to discuss various aspects of wellness, balancing those aspects and exploring ways to increase the ability to experience wellness.  Patients will create a wellness toolbox for use upon discharge.     Participation Level:  {B[facility] PARTICIPATION LEVEL:22264}  Participation Quality:  {BHH PARTICIPATION QUALITY:22265}  Affect:  {BHH AFFECT:22266}  Cognitive:  {BHH COGNITIVE:22267}  Insight: {BHH Insight2:20797}  Engagement in Group:  {BHH ENGAGEMENT IN GROUP:22268}  Modes of Intervention:  {BHH MODES OF INTERVENTION:22269}  Additional Comments:  ***  Angel [name] Angel [name] 05/02/2023, 6:22 PM  

## 2023-05-02 NOTE — Group Note (Signed)
Date:  05/02/2023 Time:  6:12 PM  Group Topic/Focus:  Wellness Toolbox:   The focus of this group is to discuss various aspects of wellness, balancing those aspects and exploring ways to increase the ability to experience wellness.  Patients will create a wellness toolbox for use upon discharge.    Participation Level:  Active  Participation Quality:  Appropriate  Affect:  Appropriate  Cognitive:  Appropriate  Insight: Appropriate  Engagement in Group:  Discussion  Additional Comments:    Angel [name] 05/02/2023, 6:12 PM

## 2023-05-02 NOTE — B[facility] Suicide Risk Assessment (Signed)
[facility] INPATIENT:  Family/Significant Other Suicide Prevention Education  Suicide Prevention Education:  Patient Refusal for Family/Significant Other Suicide Prevention Education: The patient Angel [name] has refused to provide written consent for family/significant other to be provided Family/Significant Other Suicide Prevention Education during admission and/or prior to discharge.  Physician notified.  JYRIN MORIARTY 05/02/2023, 3:33 PM

## 2023-05-02 NOTE — Group Note (Signed)
Date:  05/02/2023 Time:  10:21 AM  Group Topic/Focus:  Goals Group:   The focus of this group is to help patients establish daily goals to achieve during treatment and discuss how the patient can incorporate goal setting into their daily lives to aide in recovery. Orientation:   The focus of this group is to educate the patient on the purpose and policies of crisis stabilization and provide a format to answer questions about their admission.  The group details unit policies and expectations of patients while admitted.    Participation Level:  Active  Participation Quality:  Appropriate  Affect:  Appropriate  Cognitive:  Appropriate  Insight: Appropriate  Engagement in Group:  Engaged  Modes of Intervention:  Discussion  Additional Comments:    Jaquita Rector 05/02/2023, 10:21 AM

## 2023-05-02 NOTE — Progress Notes (Signed)
   05/02/23 6213  15 Minute Checks  Location Hallway  Visual Appearance Calm  Behavior Composed  Sleep (Behavioral Health Patients Only)  Calculate sleep? (Click Yes once per 24 hr at 0600 safety check) Yes  Documented sleep last 24 hours 9.25

## 2023-05-02 NOTE — Group Note (Signed)
Recreation Therapy Group Note   Group Topic:Problem Solving  Group Date: 05/02/2023 Start Time: 0932 End Time: 1012 Facilitators: Raheen Capili-McCall, LRT,CTRS Location: 300 Hall Dayroom   Goal Area(s) Addresses:  Patient will effectively work with peer towards shared goal.  Patient will identify skills used to make activity successful.  Patient will share challenges and verbalize solution-driven approaches used. Patient will identify how skills used during activity can be used to reach post d/c goals.   Group Description:  [employer]. Patients were provided the following materials: 4 drinking straws, 5 rubber bands, 5 paper clips, 2 index cards and 2 drinking cups. Using the provided materials patients were asked to build a launching mechanism to launch a ping pong ball across the room, approximately 10 feet. Patients were divided into teams of 3-5. Instructions required all materials be incorporated into the device, functionality of items left to the peer group's discretion.   Affect/Mood: Appropriate   Participation Level: Engaged   Participation Quality: Independent   Behavior: Appropriate   Speech/Thought Process: Focused   Insight: Good   Judgement: Good   Modes of Intervention: STEM Activity   Patient Response to Interventions:  Engaged   Education Outcome:  Acknowledges education   Clinical Observations/Individualized Feedback: Pt was engaged and worked well with peers during group session.      Plan: Continue to engage patient in RT group sessions 2-3x/week.   Basim Bartnik-McCall, LRT,CTRS 05/02/2023 12:17 PM

## 2023-05-02 NOTE — Group Note (Unsigned)
Date:  05/02/2023 Time:  10:08 AM  Group Topic/Focus:  Goals Group:   The focus of this group is to help patients establish daily goals to achieve during treatment and discuss how the patient can incorporate goal setting into their daily lives to aide in recovery. Orientation:   The focus of this group is to educate the patient on the purpose and policies of crisis stabilization and provide a format to answer questions about their admission.  The group details unit policies and expectations of patients while admitted.     Participation Level:  {BHH PARTICIPATION LEVEL:22264}  Participation Quality:  {BHH PARTICIPATION QUALITY:22265}  Affect:  {BHH AFFECT:22266}  Cognitive:  {BHH COGNITIVE:22267}  Insight: {BHH Insight2:20797}  Engagement in Group:  {BHH ENGAGEMENT IN GROUP:22268}  Modes of Intervention:  {BHH MODES OF INTERVENTION:22269}  Additional Comments:  ***  Winola Drum Lashawn Dawsyn Ramsaran 05/02/2023, 10:08 AM  

## 2023-05-02 NOTE — Progress Notes (Signed)
Pt rates depression 0/10 and anxiety 0/10. Pt presents guarded, minimal. Pt shares he was anxious earlier in the day but is hoping to discharge tomorrow. Pt denies SI/HI/AVH and verbally contracts for safety. Provided support and encouragement. Pt safe on the unit. Q 15 minute safety checks continued.

## 2023-05-03 ENCOUNTER — Encounter (HOSPITAL_COMMUNITY): Payer: Self-pay | Admitting: Family

## 2023-05-03 DIAGNOSIS — R45851 Suicidal ideations: Secondary | ICD-10-CM

## 2023-05-03 DIAGNOSIS — Z8659 Personal history of other mental and behavioral disorders: Secondary | ICD-10-CM | POA: Insufficient documentation

## 2023-05-03 HISTORY — DX: Suicidal ideations: R45.851

## 2023-05-03 HISTORY — DX: Personal history of other mental and behavioral disorders: Z86.59

## 2023-05-03 MED ORDER — PANTOPRAZOLE SODIUM 20 MG PO TBEC: 20.0000 mg | DELAYED_RELEASE_TABLET | Freq: Every day | ORAL | 0 refills | Status: DC

## 2023-05-03 MED ORDER — MIRTAZAPINE 7.5 MG PO TABS: 7.5000 mg | ORAL_TABLET | Freq: Every day | ORAL | 0 refills | Status: DC

## 2023-05-03 MED ORDER — NICOTINE 14 MG/24HR TD PT24: 14.0000 mg | MEDICATED_PATCH | Freq: Every day | TRANSDERMAL | 0 refills | Status: DC

## 2023-05-03 MED ORDER — FLUOXETINE HCL 40 MG PO CAPS: 40.0000 mg | ORAL_CAPSULE | Freq: Every day | ORAL | 0 refills | Status: DC

## 2023-05-03 MED ORDER — TRAZODONE HCL 50 MG PO TABS: 50.0000 mg | ORAL_TABLET | Freq: Every evening | ORAL | 0 refills | Status: DC | PRN

## 2023-05-03 MED ORDER — HYDROXYZINE HCL 25 MG PO TABS: 25.0000 mg | ORAL_TABLET | Freq: Three times a day (TID) | ORAL | 0 refills | Status: DC | PRN

## 2023-05-03 NOTE — Plan of Care (Signed)
  Problem: Education: Goal: Emotional status will improve Outcome: Adequate for Discharge   Problem: Education: Goal: Mental status will improve Outcome: Adequate for Discharge   Problem: Education: Goal: Verbalization of understanding the information provided will improve Outcome: Adequate for Discharge   Problem: Activity: Goal: Interest or engagement in activities will improve Outcome: Adequate for Discharge   

## 2023-05-03 NOTE — Discharge Instructions (Signed)
-  Follow-up with your outpatient psychiatric provider -instructions on appointment date, time, and address (location) are provided to you in discharge paperwork.  -Take your psychiatric medications as prescribed at discharge - instructions are provided to you in the discharge paperwork  -Follow-up with outpatient primary care doctor and other specialists -for management of preventative medicine and any chronic medical disease.  -Recommend abstinence from alcohol, tobacco, and other illicit drug use at discharge.   -If your psychiatric symptoms recur, worsen, or if you have side effects to your psychiatric medications, call your outpatient psychiatric provider, 911, [phone] or go to the nearest emergency department.  -If suicidal thoughts occur, call your outpatient psychiatric provider, 911, 988 or go to the nearest emergency department.  Naloxone (Narcan) can help reverse an overdose when given to the victim quickly.  [place] offers free naloxone kits and instructions/training on its use.  Add naloxone to your first aid kit and you can help save a life.   Pick up your free kit at the following locations:   [place]:  [facility], [address] [place] [address] ([phone]) [facility] [address] [place] [id number] ([phone]) [facility] [address] [place] [address]  [place]: [facility] [address] [place] [address] ([phone]) [facility] [address] [place] [address] ([phone])  

## 2023-05-03 NOTE — [facility] Suicide Risk Assessment (Signed)
[facility] Discharge Suicide Risk Assessment  Principal Problem: MDD (major depressive disorder), recurrent severe, without psychosis (HCC) Discharge Diagnoses: Principal Problem:   MDD (major depressive disorder), recurrent severe, without psychosis (HCC) Active Problems:   Generalized anxiety disorder   Tobacco use disorder   GERD (gastroesophageal reflux disease)  Reason for admission:  Angel [name] is a 42 y.o. male with prior psychiatric history significant for major depressive disorder recurrent severe without psychotic features, generalized anxiety disorder and tobacco use disorder. Patient [facility] to  [facility] from  Hospital ED after stabilization for worsening depression resulting in suicidal ideations with episodes of multiple superficial self mutilation on both forearms and plan to hang self in the context of homelessness, lack of food and no family support.   PTA Medications:  NA  Hospital Course:   During the patient's hospitalization, patient had extensive initial psychiatric evaluation, and follow-up psychiatric evaluations every day.  Psychiatric diagnoses provided upon initial assessment:  MDD (major depressive disorder), recurrent severe, without psychosis (HCC)  GAD  Patient's psychiatric medications were adjusted on admission:  Initiate Fluoxetine 20 mg p.o. daily for depression Initiate Abilify 5 mg p.o. daily for mood stabilization Initiate Remeron 7.5 mg p.o. q. nightly for depression and sleep Initiate Hydroxyzine 25 mg p.o. 3 times daily as needed for anxiety Initiate Trazodone 50 mg p.o. nightly as needed for insomnia Nicotine NicoDerm CQ-dose in milligrams/24 hours patch 14 mg transdermal over 24 hours daily for smoking cessation Protonix 20 mg p.o. daily for GERD  During the hospitalization, other adjustments were made to the patient's psychiatric medication regimen:  Continued Fluoxetine to 40 mg p.o. daily  for depression DISCONTINUED Abilify 2 mg p.o. daily Continue Remeron 7.5 mg p.o. q. nightly for depression and sleep Continue hydroxyzine 25 mg p.o. 3 times daily as needed for anxiety CHANGED trazodone 50 mg p.o. nightly scheduled to as needed for insomnia   During the hospitalization lab/imaging obtained: CMP, lipid panel, CBC, A1c wnl Tylenol level <10, salicylate level <7, BAL <10 UDS+THC Qtc 432  Patient's care was discussed during the interdisciplinary team meeting every day during the hospitalization.  Patient's side effects to prescribed psychiatric medications: NA  Assessment  Gradually, patient started adjusting to milieu. The patient was evaluated each day by a clinical provider to ascertain response to treatment. Improvement was noted by the patient's report of decreasing symptoms, improved sleep and appetite, affect, medication tolerance, behavior, and participation in unit programming.  Patient was asked each day to complete a self inventory noting mood, mental status, pain, new symptoms, anxiety and concerns.    Symptoms were reported as significantly decreased or resolved completely by discharge.   On day of discharge, the patient reports that their mood is stable. The patient denied having suicidal thoughts for more than 48 hours prior to discharge.  Patient denies having homicidal thoughts.  Patient denies having auditory hallucinations.  Patient denies any visual hallucinations or other symptoms of psychosis. The patient was motivated to continue taking medication with a goal of continued improvement in mental health.   The patient reports their target psychiatric symptoms of depression and anxiety responded well to the psychiatric medications, and the patient reports overall benefit other psychiatric hospitalization. Supportive psychotherapy was provided to the patient. The patient also participated in regular group therapy while hospitalized. Coping skills, problem  solving as well as relaxation therapies were also part of the unit programming.  Labs were reviewed with the patient, and abnormal results were   discussed with the patient.  The patient is able to verbalize their individual safety plan to this provider.  Behavioral Events: NA  Restraints: NA  Groups: Appropriate, engaged     Medication List    START taking these medications    FLUoxetine 40 MG capsule; Commonly known as: PROZAC; Take 1 capsule (40  mg total) by mouth daily.; Start taking on: May 04, 2023  hydrOXYzine 25 MG tablet; Commonly known as: ATARAX; Take 1 tablet (25  mg total) by mouth 3 (three) times daily as needed for anxiety or vomiting  (sleep, anger).  mirtazapine 7.5 MG tablet; Commonly known as: REMERON; Take 1 tablet  (7.5 mg total) by mouth at bedtime.  nicotine 14 mg/24hr patch; Commonly known as: NICODERM CQ - dosed in  mg/24 hours; Place 1 patch (14 mg total) onto the skin daily.; Start  taking on: May 04, 2023  pantoprazole 20 MG tablet; Commonly known as: PROTONIX; Take 1 tablet  (20 mg total) by mouth daily.; Start taking on: May 04, 2023  traZODone 50 MG tablet; Commonly known as: DESYREL; Take 1 tablet (50 mg  total) by mouth at bedtime as needed for sleep.    Musculoskeletal: Strength & Muscle Tone: within normal limits Gait & Station: normal Patient leans: N/A   Presentation  General Appearance:Disheveled (showered) Eye Contact:Fair Speech:Clear and Coherent, Normal Rate Volume:Normal Handedness:Right  Mood and Affect  Mood:Depressed, Anxious Affect:Appropriate, Congruent, Constricted  Thought Process  Thought Process:Coherent, Goal Directed, Linear Descriptions of Associations:Intact  Thought Content Suicidal Thoughts:Suicidal Thoughts: No Homicidal Thoughts:Homicidal Thoughts: No Hallucinations:Hallucinations: None Ideas of Reference:None Thought Content:Rumination  Sensorium  Memory:Immediate  Good Judgment:Fair Insight:Fair  Executive Functions  Orientation:Full (Time, Place and Person) Language:Good Concentration:Good Attention:Good Recall:Good Fund of Knowledge:Good  Psychomotor Activity  Psychomotor Activity:Psychomotor Activity: Normal  Assets  Assets:Communication Skills, Desire for Improvement, Resilience  Sleep  Quality:Good  Physical Exam Vitals and nursing note reviewed.  Constitutional:      General: He is not in acute distress.    Appearance: He is not ill-appearing, toxic-appearing or diaphoretic.  HENT:     Head: Normocephalic.  Pulmonary:     Effort: Pulmonary effort is normal. No respiratory distress.  Neurological:     Mental Status: He is alert.     Review of Systems  Respiratory:  Negative for shortness of breath.   Cardiovascular:  Negative for chest pain.  Gastrointestinal:  Negative for nausea and vomiting.  Neurological:  Negative for dizziness and headaches.     Blood pressure 124/71, pulse 86, temperature 97.6 F (36.4 C), temperature source Oral, resp. rate 18, height 5' 8" (1.727 m), weight 67.2 kg, SpO2 99 %. Body mass index is 22.51 kg/m.  Mental Status Per Nursing Assessment::   On Admission:  Suicidal ideation indicated by patient, Suicidal ideation indicated by others, Self-harm thoughts, Self-harm behaviors  Demographic Factors: Male, Caucasian, Unemployed, Living alone, Low socioeconomic status  Loss Factors: Financial problems / change in socioeconomic status (lost housing)  Historical Factors: Prior suicide attempts, Impulsivity  Risk Reduction Factors:   Positive social support, Positive therapeutic relationship, and Positive coping skills or problem solving skills  Continued Clinical Symptoms:  Previous Psychiatric Diagnoses and Treatments Medical Diagnoses and Treatments/Surgeries  Cognitive Features That Contribute To Risk:  None    Suicide Risk:  Mild: There are no identifiable suicide plans, no  associated intent, mild dysphoria and related symptoms, good self-control (both objective and subjective assessment), few other risk factors, and identifiable protective factors, including available   and accessible social support    Follow-up Information     Services, [facility]. Go on 05/07/2023.   Why: You have an assessment on 05/07/23 at 10:00 am, to obtain appointments for therapy and medication management services. The appointment will be held in person. Please bring photo ID, and insurance card with you. Contact information: [address] [place] [address] [phone]                 Discharge recommendations:    # It is recommended to the patient to continue psychiatric medications as prescribed, after discharge from the hospital.     # It is recommended to the patient to follow up with your outpatient psychiatric provider -instructions on appointment date, time, and address (location) are provided to you in discharge paperwork  # Follow-up with outpatient primary care doctor and other specialists -for management of chronic medical disease, including:  GERD  # Testing: Follow-up with outpatient provider for abnormal lab results:  See above   # It was discussed with the patient, the impact of alcohol, drugs, tobacco have been there overall psychiatric and medical wellbeing, and total abstinence from substance use was recommended to the patient.   # Prescriptions provided or sent directly to preferred pharmacy at discharge. Patient agreeable to plan. Given opportunity to ask questions. Appears to feel comfortable with discharge.    # In the event of worsening symptoms, the patient is instructed to call the crisis hotline, 911 and or go to the nearest ED for appropriate evaluation and treatment of symptoms. To follow-up with primary care provider for other medical issues, concerns and or health care needs  Patient agrees with D/C instructions and plan.   Total  Time Spent in Direct Patient Care: See attending attestation.  Signed: Ambyr Qadri, DO Psychiatry Resident, PGY-2 [facility]  [address] [place], [place] [address] Ph: [phone] Fax: [phone] 05/03/2023, 8:58 AM   

## 2023-05-03 NOTE — Hospital Course (Addendum)
Hospital Course:   During the patient's hospitalization, patient had extensive initial psychiatric evaluation, and follow-up psychiatric evaluations every day.  Psychiatric diagnoses provided upon initial assessment:  MDD (major depressive disorder), recurrent severe, without psychosis (HCC)  GAD  Patient's psychiatric medications were adjusted on admission:  Initiate Fluoxetine 20 mg p.o. daily for depression Initiate Abilify 5 mg p.o. daily for mood stabilization Initiate Remeron 7.5 mg p.o. q. nightly for depression and sleep Initiate Hydroxyzine 25 mg p.o. 3 times daily as needed for anxiety Initiate Trazodone 50 mg p.o. nightly as needed for insomnia Nicotine NicoDerm CQ-dose in milligrams/24 hours patch 14 mg transdermal over 24 hours daily for smoking cessation Protonix 20 mg p.o. daily for GERD  During the hospitalization, other adjustments were made to the patient's psychiatric medication regimen:  Continued Fluoxetine to 40 mg p.o. daily for depression DISCONTINUED Abilify 2 mg p.o. daily Continue Remeron 7.5 mg p.o. q. nightly for depression and sleep Continue hydroxyzine 25 mg p.o. 3 times daily as needed for anxiety CHANGED trazodone 50 mg p.o. nightly scheduled to as needed for insomnia   During the hospitalization lab/imaging obtained: CMP, lipid panel, CBC, A1c wnl Tylenol level <10, salicylate level <7, BAL <10 UDS+THC Qtc 432  Patient's care was discussed during the interdisciplinary team meeting every day during the hospitalization.  Patient's side effects to prescribed psychiatric medications: NA  Assessment  Gradually, patient started adjusting to milieu. The patient was evaluated each day by a clinical provider to ascertain response to treatment. Improvement was noted by the patient's report of decreasing symptoms, improved sleep and appetite, affect, medication tolerance, behavior, and participation in unit programming.  Patient was asked each day to  complete a self inventory noting mood, mental status, pain, new symptoms, anxiety and concerns.    Symptoms were reported as significantly decreased or resolved completely by discharge.   On day of discharge, the patient reports that their mood is stable. The patient denied having suicidal thoughts for more than 48 hours prior to discharge.  Patient denies having homicidal thoughts.  Patient denies having auditory hallucinations.  Patient denies any visual hallucinations or other symptoms of psychosis. The patient was motivated to continue taking medication with a goal of continued improvement in mental health.   The patient reports their target psychiatric symptoms of depression and anxiety responded well to the psychiatric medications, and the patient reports overall benefit other psychiatric hospitalization. Supportive psychotherapy was provided to the patient. The patient also participated in regular group therapy while hospitalized. Coping skills, problem solving as well as relaxation therapies were also part of the unit programming.  Labs were reviewed with the patient, and abnormal results were discussed with the patient.  The patient is able to verbalize their individual safety plan to this provider.  Behavioral Events: NA  Restraints: NA  Groups: Appropriate, engaged

## 2023-05-03 NOTE — H&P (Deleted)
[facility] Discharge Suicide Risk Assessment  Principal Problem: MDD (major depressive disorder), recurrent severe, without psychosis (HCC) Discharge Diagnoses: Principal Problem:   MDD (major depressive disorder), recurrent severe, without psychosis (HCC) Active Problems:   Generalized anxiety disorder   Tobacco use disorder   GERD (gastroesophageal reflux disease)  Reason for admission:  Angel [name] is a 42 y.o. male with prior psychiatric history significant for major depressive disorder recurrent severe without psychotic features, generalized anxiety disorder and tobacco use disorder. Patient presents voluntarily to [facility] from [facility] ED after stabilization for worsening depression resulting in suicidal ideations with episodes of multiple superficial self mutilation on both forearms and plan to hang self in the context of homelessness, lack of food and no family support.   PTA Medications:  NA  Hospital Course:   During the patient's hospitalization, patient had extensive initial psychiatric evaluation, and follow-up psychiatric evaluations every day.  Psychiatric diagnoses provided upon initial assessment:  MDD (major depressive disorder), recurrent severe, without psychosis (HCC)  GAD  Patient's psychiatric medications were adjusted on admission:  Initiate Fluoxetine 20 mg p.o. daily for depression Initiate Abilify 5 mg p.o. daily for mood stabilization Initiate Remeron 7.5 mg p.o. q. nightly for depression and sleep Initiate Hydroxyzine 25 mg p.o. 3 times daily as needed for anxiety Initiate Trazodone 50 mg p.o. nightly as needed for insomnia Nicotine NicoDerm CQ-dose in milligrams/24 hours patch 14 mg transdermal over 24 hours daily for smoking cessation Protonix 20 mg p.o. daily for GERD  During the hospitalization, other adjustments were made to the patient's psychiatric medication regimen:  Continued Fluoxetine to 40 mg p.o. daily  for depression DISCONTINUED Abilify 2 mg p.o. daily Continue Remeron 7.5 mg p.o. q. nightly for depression and sleep Continue hydroxyzine 25 mg p.o. 3 times daily as needed for anxiety CHANGED trazodone 50 mg p.o. nightly scheduled to as needed for insomnia   During the hospitalization lab/imaging obtained: CMP, lipid panel, CBC, A1c wnl Tylenol level <10, salicylate level <7, BAL <10 UDS+THC Qtc 432  Patient's care was discussed during the interdisciplinary team meeting every day during the hospitalization.  Patient's side effects to prescribed psychiatric medications: NA  Assessment  Gradually, patient started adjusting to milieu. The patient was evaluated each day by a clinical provider to ascertain response to treatment. Improvement was noted by the patient's report of decreasing symptoms, improved sleep and appetite, affect, medication tolerance, behavior, and participation in unit programming.  Patient was asked each day to complete a self inventory noting mood, mental status, pain, new symptoms, anxiety and concerns.    Symptoms were reported as significantly decreased or resolved completely by discharge.   On day of discharge, the patient reports that their mood is stable. The patient denied having suicidal thoughts for more than 48 hours prior to discharge.  Patient denies having homicidal thoughts.  Patient denies having auditory hallucinations.  Patient denies any visual hallucinations or other symptoms of psychosis. The patient was motivated to continue taking medication with a goal of continued improvement in mental health.   The patient reports their target psychiatric symptoms of depression and anxiety responded well to the psychiatric medications, and the patient reports overall benefit other psychiatric hospitalization. Supportive psychotherapy was provided to the patient. The patient also participated in regular group therapy while hospitalized. Coping skills, problem  solving as well as relaxation therapies were also part of the unit programming.  Labs were reviewed with the patient, and abnormal results were  discussed with the patient.  The patient is able to verbalize their individual safety plan to this provider.  Behavioral Events: NA  Restraints: NA  Groups: Appropriate, engaged     Medication List    START taking these medications    FLUoxetine 40 MG capsule; Commonly known as: PROZAC; Take 1 capsule (40  mg total) by mouth daily.; Start taking on: May 04, 2023  hydrOXYzine 25 MG tablet; Commonly known as: ATARAX; Take 1 tablet (25  mg total) by mouth 3 (three) times daily as needed for anxiety or vomiting  (sleep, anger).  mirtazapine 7.5 MG tablet; Commonly known as: REMERON; Take 1 tablet  (7.5 mg total) by mouth at bedtime.  nicotine 14 mg/24hr patch; Commonly known as: NICODERM CQ - dosed in  mg/24 hours; Place 1 patch (14 mg total) onto the skin daily.; Start  taking on: May 04, 2023  pantoprazole 20 MG tablet; Commonly known as: PROTONIX; Take 1 tablet  (20 mg total) by mouth daily.; Start taking on: May 04, 2023  traZODone 50 MG tablet; Commonly known as: DESYREL; Take 1 tablet (50 mg  total) by mouth at bedtime as needed for sleep.    Musculoskeletal: Strength & Muscle Tone: within normal limits Gait & Station: normal Patient leans: N/A   Presentation  General Appearance:Disheveled (showered) Eye Contact:Fair Speech:Clear and Coherent, Normal Rate Volume:Normal Handedness:Right  Mood and Affect  Mood:Depressed, Anxious Affect:Appropriate, Congruent, Constricted  Thought Process  Thought Process:Coherent, Goal Directed, Linear Descriptions of Associations:Intact  Thought Content Suicidal Thoughts:Suicidal Thoughts: No Homicidal Thoughts:Homicidal Thoughts: No Hallucinations:Hallucinations: None Ideas of Reference:None Thought Content:Rumination  Sensorium  Memory:Immediate  Good Judgment:Fair Insight:Fair  Executive Functions  Orientation:Full (Time, Place and Person) Language:Good Concentration:Good Attention:Good Recall:Good Fund of Knowledge:Good  Psychomotor Activity  Psychomotor Activity:Psychomotor Activity: Normal  Assets  Assets:Communication Skills, Desire for Improvement, Resilience  Sleep  Quality:Good  Physical Exam Vitals and nursing note reviewed.  Constitutional:      General: He is not in acute distress.    Appearance: He is not ill-appearing, toxic-appearing or diaphoretic.  HENT:     Head: Normocephalic.  Pulmonary:     Effort: Pulmonary effort is normal. No respiratory distress.  Neurological:     Mental Status: He is alert.     Review of Systems  Respiratory:  Negative for shortness of breath.   Cardiovascular:  Negative for chest pain.  Gastrointestinal:  Negative for nausea and vomiting.  Neurological:  Negative for dizziness and headaches.     Blood pressure 124/71, pulse 86, temperature 97.6 F (36.4 C), temperature source Oral, resp. rate 18, height 5\' 8"  (1.727 m), weight 67.2 kg, SpO2 99 %. Body mass index is 22.51 kg/m.  Mental Status Per Nursing Assessment::   On Admission:  Suicidal ideation indicated by patient, Suicidal ideation indicated by others, Self-harm thoughts, Self-harm behaviors  Demographic Factors: Male, Caucasian, Unemployed, Living alone, Low socioeconomic status  Loss Factors: Financial problems / change in socioeconomic status (lost housing)  Historical Factors: Prior suicide attempts, Impulsivity  Risk Reduction Factors:   Positive social support, Positive therapeutic relationship, and Positive coping skills or problem solving skills  Continued Clinical Symptoms:  Previous Psychiatric Diagnoses and Treatments Medical Diagnoses and Treatments/Surgeries  Cognitive Features That Contribute To Risk:  None    Suicide Risk:  Mild: There are no identifiable suicide plans, no  associated intent, mild dysphoria and related symptoms, good self-control (both objective and subjective assessment), few other risk factors, and identifiable protective factors, including available  and accessible social support    Follow-up Information     Services, [facility]. Go on 05/07/2023.   Why: You have an assessment on 05/07/23 at 10:00 am, to obtain appointments for therapy and medication management services. The appointment will be held in person. Please bring photo ID, and insurance card with you. Contact information: [address] [place] [address] [phone]                 Discharge recommendations:    # It is recommended to the patient to continue psychiatric medications as prescribed, after discharge from the hospital.     # It is recommended to the patient to follow up with your outpatient psychiatric provider -instructions on appointment date, time, and address (location) are provided to you in discharge paperwork  # Follow-up with outpatient primary care doctor and other specialists -for management of chronic medical disease, including:  GERD  # Testing: Follow-up with outpatient provider for abnormal lab results:  See above   # It was discussed with the patient, the impact of alcohol, drugs, tobacco have been there overall psychiatric and medical wellbeing, and total abstinence from substance use was recommended to the patient.   # Prescriptions provided or sent directly to preferred pharmacy at discharge. Patient agreeable to plan. Given opportunity to ask questions. Appears to feel comfortable with discharge.    # In the event of worsening symptoms, the patient is instructed to call the crisis hotline, 911 and or go to the nearest ED for appropriate evaluation and treatment of symptoms. To follow-up with primary care provider for other medical issues, concerns and or health care needs  Patient agrees with D/C instructions and plan.   Total  Time Spent in Direct Patient Care: See attending attestation.  Signed: Princess Bruins, DO Psychiatry Resident, PGY-2 [facility] - [facility]  [address] [place], [place] [address] Ph: [phone] Fax: [phone] 05/03/2023, 8:58 AM

## 2023-05-03 NOTE — Progress Notes (Addendum)
Patient denies Si, HI, AVH and depression. Patient rates anxiety 4/10. Patient is calm, cooperative and pleasant. Presents with flat affect, and soft speech. Patient adheres to treatment and is looking forward to discharge.   05/03/23 0900  Psych Admission Type (Psych Patients Only)  Admission Status Voluntary  Psychosocial Assessment  Patient Complaints Anxiety  Eye Contact Brief  Facial Expression Flat  Affect Flat  Speech Logical/coherent  Interaction Guarded;Minimal  Motor Activity Slow  Appearance/Hygiene Disheveled  Behavior Characteristics Cooperative  Mood Anxious  Thought Process  Coherency WDL  Content WDL  Delusions None reported or observed  Perception WDL  Hallucination None reported or observed  Judgment Impaired  Confusion None  Danger to Self  Current suicidal ideation? Denies  Self-Injurious Behavior No self-injurious ideation or behavior indicators observed or expressed   Agreement Not to Harm Self Yes  Description of Agreement verbal  Danger to Others  Danger to Others None reported or observed

## 2023-05-03 NOTE — Discharge Summary (Signed)
Physician Discharge Summary Note  Patient Identification: Angel [name], 42 y.o. male  MRN: [id number] DOB: 1981/03/24  Date of Evaluation: 05/03/2023, 4:47 PM Bed: 0305/0305-01 Patient phone: [phone] (home)  Patient address:   [address] [place] [address]  Date of Admission: 04/28/2023 Date of Discharge: 05/03/2023  Reason for Admission:   Angel [name] is a 42 y.o. male with prior psychiatric history significant for major depressive disorder recurrent severe without psychotic features, generalized anxiety disorder and tobacco use disorder. Patient presents voluntarily to [facility] from [facility] ED after stabilization for worsening depression resulting in suicidal ideations with episodes of multiple superficial self mutilation on both forearms and plan to hang self in the context of homelessness, lack of food and no family support.    PTA Medications:  NA   Principal Problem: MDD (major depressive disorder), recurrent severe, without psychosis (HCC) Discharge Diagnoses: Principal Problem:   MDD (major depressive disorder), recurrent severe, without psychosis (HCC) Active Problems:   Generalized anxiety disorder   Tobacco use disorder   GERD (gastroesophageal reflux disease)   Past Psychiatric History: Per above Past Medical History:  Past Medical History:  Diagnosis Date   Anxiety    Depression    History of admission to inpatient psychiatry department 05/03/2023   Migraine    Suicidal ideation 05/03/2023   History reviewed. No pertinent surgical history. Family History:  Family History  Problem Relation Age of Onset   Cancer Mother        brain   Diabetes Brother    Hypertension Brother    Family Psychiatric  History: Per above Social History:  Social History   Substance and Sexual Activity  Alcohol Use No    Social History   Substance and Sexual Activity  Drug Use Yes   Types: Marijuana    Social  History   Socioeconomic History   Marital status: Single    Spouse name: Not on file   Number of children: Not on file   Years of education: Not on file   Highest education level: Not on file  Occupational History   Not on file  Tobacco Use   Smoking status: Every Day    Packs/day: 1.50    Years: 20.00    Additional pack years: 0.00    Total pack years: 30.00    Types: Cigarettes   Smokeless tobacco: Never  Vaping Use   Vaping Use: Never used  Substance and Sexual Activity   Alcohol use: No   Drug use: Yes    Types: Marijuana   Sexual activity: Not Currently    Birth control/protection: Condom  Other Topics Concern   Not on file  Social History Narrative   Not on file   Social Determinants of Health   Financial Resource Strain: Not on file  Food Insecurity: Food Insecurity Present (04/28/2023)   Hunger Vital Sign    Worried About Running Out of Food in the Last Year: Sometimes true    Ran Out of Food in the Last Year: Sometimes true  Transportation Needs: Unmet Transportation Needs (04/28/2023)   PRAPARE - Administrator, Civil Service (Medical): No    Lack of Transportation (Non-Medical): Yes  Physical Activity: Not on file  Stress: Not on file  Social Connections: Not on file    Hospital Course:   During the patient's hospitalization, patient had extensive initial psychiatric evaluation, and follow-up psychiatric evaluations every day.  Psychiatric diagnoses provided upon  initial assessment:  MDD (major depressive disorder), recurrent severe, without psychosis (HCC)  GAD  Patient's psychiatric medications were adjusted on admission:  Initiate Fluoxetine 20 mg p.o. daily for depression Initiate Abilify 5 mg p.o. daily for mood stabilization Initiate Remeron 7.5 mg p.o. q. nightly for depression and sleep Initiate Hydroxyzine 25 mg p.o. 3 times daily as needed for anxiety Initiate Trazodone 50 mg p.o. nightly as needed for insomnia Nicotine NicoDerm  CQ-dose in milligrams/24 hours patch 14 mg transdermal over 24 hours daily for smoking cessation Protonix 20 mg p.o. daily for GERD  During the hospitalization, other adjustments were made to the patient's psychiatric medication regimen:  Continued Fluoxetine to 40 mg p.o. daily for depression DISCONTINUED Abilify 2 mg p.o. daily Continue Remeron 7.5 mg p.o. q. nightly for depression and sleep Continue hydroxyzine 25 mg p.o. 3 times daily as needed for anxiety CHANGED trazodone 50 mg p.o. nightly scheduled to as needed for insomnia   During the hospitalization lab/imaging obtained: CMP, lipid panel, CBC, A1c wnl Tylenol level <10, salicylate level <7, BAL <10 UDS+THC Qtc 432  Patient's care was discussed during the interdisciplinary team meeting every day during the hospitalization.  Patient's side effects to prescribed psychiatric medications: NA  Assessment  Gradually, patient started adjusting to milieu. The patient was evaluated each day by a clinical provider to ascertain response to treatment. Improvement was noted by the patient's report of decreasing symptoms, improved sleep and appetite, affect, medication tolerance, behavior, and participation in unit programming.  Patient was asked each day to complete a self inventory noting mood, mental status, pain, new symptoms, anxiety and concerns.    Symptoms were reported as significantly decreased or resolved completely by discharge.   On day of discharge, the patient reports that their mood is stable. The patient denied having suicidal thoughts for more than 48 hours prior to discharge.  Patient denies having homicidal thoughts.  Patient denies having auditory hallucinations.  Patient denies any visual hallucinations or other symptoms of psychosis. The patient was motivated to continue taking medication with a goal of continued improvement in mental health.   The patient reports their target psychiatric symptoms of depression and  anxiety responded well to the psychiatric medications, and the patient reports overall benefit other psychiatric hospitalization. Supportive psychotherapy was provided to the patient. The patient also participated in regular group therapy while hospitalized. Coping skills, problem solving as well as relaxation therapies were also part of the unit programming.  Labs were reviewed with the patient, and abnormal results were discussed with the patient.  The patient is able to verbalize their individual safety plan to this provider.  Behavioral Events: NA  Restraints: NA  Groups: Appropriate, engaged   While future psychiatric events cannot be accurately predicted, the patient does not currently require acute inpatient psychiatric care and does not currently meet [facility] involuntary commitment criteria.  # It is recommended to the patient to continue psychiatric medications as prescribed, after discharge from the hospital.    # It is recommended to the patient to follow up with your outpatient psychiatric provider and PCP.  # It was discussed with the patient, the impact of alcohol, drugs, tobacco have been there overall psychiatric and medical wellbeing, and total abstinence from substance use was recommended to the patient.  # Prescriptions provided or sent directly to preferred pharmacy at discharge. Patient agreeable to plan. Given opportunity to ask questions. Appears to feel comfortable with discharge.    # In the event of  worsening symptoms, the patient is instructed to call the crisis hotline, 911 and or go to the nearest ED for appropriate evaluation and treatment of symptoms. To follow-up with primary care provider for other medical issues, concerns and or health care needs  # Patient was discharged home with friends with a plan to follow up as noted below.  Is patient on multiple antipsychotic therapies at discharge:  No   Has Patient had three or more failed trials of  antipsychotic monotherapy by history:  No Recommended Plan for Multiple Antipsychotic Therapies: NA Tobacco Cessation:  A prescription for an FDA-approved tobacco cessation medication provided at discharge  Physical Findings: BP 124/71 (BP Location: Left Arm)   Pulse 86   Temp 97.6 F (36.4 C) (Oral)   Resp 18   Ht 5\' 8"  (1.727 m)   Wt 67.2 kg   SpO2 99%   BMI 22.51 kg/m    Physical Exam Vitals and nursing note reviewed.  Constitutional:      General: He is not in acute distress.    Appearance: He is not ill-appearing, toxic-appearing or diaphoretic.  HENT:     Head: Normocephalic.  Pulmonary:     Effort: Pulmonary effort is normal. No respiratory distress.  Neurological:     Mental Status: He is alert.      Review of Systems  Respiratory:  Negative for shortness of breath.   Cardiovascular:  Negative for chest pain.  Gastrointestinal:  Negative for nausea and vomiting.  Neurological:  Negative for dizziness and headaches.   Musculoskeletal: Strength & Muscle Tone: within normal limits Gait & Station: normal Patient leans: N/A   Presentation  General Appearance:Disheveled (showered) Eye Contact:Fair Speech:Clear and Coherent, Normal Rate Volume:Normal Handedness:Right  Mood and Affect  Mood:Depressed, Anxious Affect:Appropriate, Congruent, Constricted  Thought Process  Thought Process:Coherent, Goal Directed, Linear Descriptions of Associations:Intact  Thought Content Suicidal Thoughts:Suicidal Thoughts: No Homicidal Thoughts:Homicidal Thoughts: No Hallucinations:Hallucinations: None Ideas of Reference:None Thought Content:Rumination  Sensorium  Memory:Immediate Good Judgment:Fair Insight:Fair  Executive Functions  Orientation:Full (Time, Place and Person) Language:Good Concentration:Good Attention:Good Recall:Good Fund of Knowledge:Good  Psychomotor Activity  Psychomotor Activity:Psychomotor Activity: Normal  Assets   Assets:Communication Skills, Desire for Improvement, Resilience  Sleep  Quality:Good  Social History   Tobacco Use  Smoking Status Every Day   Packs/day: 1.50   Years: 20.00   Additional pack years: 0.00   Total pack years: 30.00   Types: Cigarettes  Smokeless Tobacco Never    Blood Alcohol level:  Lab Results  Component Value Date   ETH <10 04/27/2023   ETH <10 08/27/2018    Metabolic Disorder Labs:  Lab Results  Component Value Date   HGBA1C 5.3 04/30/2023   MPG 105 04/30/2023   MPG 105 08/28/2018   Lab Results  Component Value Date   PROLACTIN 6.7 08/28/2018   PROLACTIN 17.3 (H) 05/17/2017   Lab Results  Component Value Date   CHOL 168 04/30/2023   TRIG 108 04/30/2023   HDL 55 04/30/2023   CHOLHDL 3.1 04/30/2023   VLDL 22 04/30/2023   LDLCALC 91 04/30/2023   LDLCALC 104 (H) 08/28/2018    Discharge destination: See above  Discharge Instructions     Activity as tolerated - No restrictions   Complete by: As directed    Diet general   Complete by: As directed       Allergies as of 05/03/2023   No Known Allergies      Medication List     TAKE these  medications      Indication  FLUoxetine 40 MG capsule Commonly known as: PROZAC Take 1 capsule (40 mg total) by mouth daily. Start taking on: May 04, 2023  Indication: Generalized Anxiety Disorder, Major Depressive Disorder, Social Anxiety Disorder   hydrOXYzine 25 MG tablet Commonly known as: ATARAX Take 1 tablet (25 mg total) by mouth 3 (three) times daily as needed for anxiety or vomiting (sleep, anger).  Indication: Feeling Anxious, Feeling Tense, sleep, anger   mirtazapine 7.5 MG tablet Commonly known as: REMERON Take 1 tablet (7.5 mg total) by mouth at bedtime.  Indication: Major Depressive Disorder   nicotine 14 mg/24hr patch Commonly known as: NICODERM CQ - dosed in mg/24 hours Place 1 patch (14 mg total) onto the skin daily. Start taking on: May 04, 2023  Indication: Nicotine  Addiction   pantoprazole 20 MG tablet Commonly known as: PROTONIX Take 1 tablet (20 mg total) by mouth daily. Start taking on: May 04, 2023  Indication: Gastroesophageal Reflux Disease, Heartburn   traZODone 50 MG tablet Commonly known as: DESYREL Take 1 tablet (50 mg total) by mouth at bedtime as needed for sleep.  Indication: Anxiety Disorder, Major Depressive Disorder        Follow-up Information     Services, [facility]. Go on 05/07/2023.   Why: You have an assessment on 05/07/23 at 10:00 am, to obtain appointments for therapy and medication management services. The appointment will be held in person. Please bring photo ID, and insurance card with you. Contact information: [address] [place] [address] [phone]                 Discharge recommendations:   # It is recommended to the patient to continue psychiatric medications as prescribed, after discharge from the hospital.     # It is recommended to the patient to follow up with your outpatient psychiatric provider -instructions on appointment date, time, and address (location) are provided to you in discharge paperwork  # Follow-up with outpatient primary care doctor and other specialists -for management of chronic medical disease, including:  GERD  # Testing: Follow-up with outpatient provider for abnormal lab results:  See above   # It was discussed with the patient, the impact of alcohol, drugs, tobacco have been there overall psychiatric and medical wellbeing, and total abstinence from substance use was recommended to the patient.   # Prescriptions provided or sent directly to preferred pharmacy at discharge. Patient agreeable to plan. Given opportunity to ask questions. Appears to feel comfortable with discharge.    # In the event of worsening symptoms, the patient is instructed to call the crisis hotline, 911 and or go to the nearest ED for appropriate evaluation and treatment of  symptoms. To follow-up with primary care provider for other medical issues, concerns and or health care needs  Total Time Spent in Direct Patient Care: See attending attestation.  Signed: Princess Bruins, DO Psychiatry Resident, PGY-2 [facility] - Adult  [address] [place], [place] [address] Ph: [phone] Fax: [phone]

## 2023-05-03 NOTE — Progress Notes (Signed)
  [facility] Adult Case Management Discharge Plan :  Will you be returning to the same living situation after discharge:  No. Patient to discharge to friend's residence.  At discharge, do you have transportation home?: Yes,  Patient's friend to provide transportation from hospital.  Do you have the ability to pay for your medications: Yes,  [place] [employer]  Release of information consent forms completed and in the chart;  Patient's signature needed at discharge.  Patient to Follow up at:  Follow-up Information     [facility], [facility]. Go on 05/07/2023.   Why: You have an assessment on 05/07/23 at 10:00 am, to obtain appointments for therapy and medication management services. The appointment will be held in person. Please bring photo ID, and insurance card with you. Contact information: [address] [place] [address] [phone]                 Next level of care provider has access to [facility] Link:no  Safety Planning and Suicide Prevention discussed: Yes,  SPE completed with patient by nursing staffl; Patient declined consent to reach family/friend    Has patient been referred to the Quitline?: Yes, faxed/e-referral on 05/04/2023 Tobacco Use: High Risk (05/03/2023)   Patient History    Smoking Tobacco Use: Every Day    Smokeless Tobacco Use: Never    Passive Exposure: Not on file   Patient has been referred for addiction treatment: No known substance use disorder. Social History   Substance and Sexual Activity  Alcohol Use No   Social History   Substance and Sexual Activity  Drug Use Yes   Types: Marijuana  Drugs of Abuse     Component Value Date/Time   LABOPIA NONE DETECTED 04/27/2023 1150   COCAINSCRNUR NONE DETECTED 04/27/2023 1150   LABBENZ NONE DETECTED 04/27/2023 1150   AMPHETMU NONE DETECTED 04/27/2023 1150   THCU POSITIVE (A) 04/27/2023 1150   LABBARB NONE DETECTED 04/27/2023 1150     Wesly Counihan Solomon, Theresia Majors 05/03/2023, 9:55 AM

## 2023-05-23 ENCOUNTER — Encounter (HOSPITAL_COMMUNITY): Payer: Self-pay

## 2023-05-23 ENCOUNTER — Other Ambulatory Visit: Payer: Self-pay

## 2023-05-23 ENCOUNTER — Emergency Department (HOSPITAL_COMMUNITY)
Admission: EM | Admit: 2023-05-23 | Discharge: 2023-05-23 | Disposition: A | Discharge status: Other Acute Inpatient Hospital | Payer: Medicaid Other | Attending: Emergency Medicine | Admitting: Emergency Medicine

## 2023-05-23 ENCOUNTER — Encounter: Payer: Self-pay | Admitting: Family

## 2023-05-23 ENCOUNTER — Inpatient Hospital Stay
Admission: AD | Admit: 2023-05-23 | Discharge: 2023-05-31 | DRG: 885 | Disposition: A | Discharge status: Home / Self Care | Payer: Medicaid Other | Source: Intra-hospital | Attending: Psychiatry | Admitting: Psychiatry

## 2023-05-23 DIAGNOSIS — Z5986 Financial insecurity: Secondary | ICD-10-CM

## 2023-05-23 DIAGNOSIS — R45851 Suicidal ideations: Secondary | ICD-10-CM | POA: Insufficient documentation

## 2023-05-23 DIAGNOSIS — F411 Generalized anxiety disorder: Secondary | ICD-10-CM | POA: Insufficient documentation

## 2023-05-23 DIAGNOSIS — Z818 Family history of other mental and behavioral disorders: Secondary | ICD-10-CM | POA: Diagnosis not present

## 2023-05-23 DIAGNOSIS — Z5941 Food insecurity: Secondary | ICD-10-CM

## 2023-05-23 DIAGNOSIS — Z5982 Transportation insecurity: Secondary | ICD-10-CM

## 2023-05-23 DIAGNOSIS — Z8249 Family history of ischemic heart disease and other diseases of the circulatory system: Secondary | ICD-10-CM

## 2023-05-23 DIAGNOSIS — F17210 Nicotine dependence, cigarettes, uncomplicated: Secondary | ICD-10-CM | POA: Diagnosis present

## 2023-05-23 DIAGNOSIS — F332 Major depressive disorder, recurrent severe without psychotic features: Principal | ICD-10-CM | POA: Diagnosis present

## 2023-05-23 DIAGNOSIS — Z9151 Personal history of suicidal behavior: Secondary | ICD-10-CM

## 2023-05-23 DIAGNOSIS — Z5900 Homelessness unspecified: Secondary | ICD-10-CM | POA: Diagnosis not present

## 2023-05-23 LAB — COMPREHENSIVE METABOLIC PANEL
ALT: 45 U/L — ABNORMAL HIGH (ref 0–44)
AST: 32 U/L (ref 15–41)
Albumin: 4 g/dL (ref 3.5–5.0)
Alkaline Phosphatase: 58 U/L (ref 38–126)
Anion gap: 9 (ref 5–15)
BUN: 12 mg/dL (ref 6–20)
CO2: 23 mmol/L (ref 22–32)
Calcium: 8.9 mg/dL (ref 8.9–10.3)
Chloride: 105 mmol/L (ref 98–111)
Creatinine, Ser: 0.82 mg/dL (ref 0.61–1.24)
GFR, Estimated: >60 mL/min (ref >60)
Glucose, Bld: 111 mg/dL — ABNORMAL HIGH (ref 70–99)
Potassium: 3.5 mmol/L (ref 3.5–5.1)
Sodium: 137 mmol/L (ref 135–145)
Total Bilirubin: 0.6 mg/dL (ref 0.3–1.2)
Total Protein: 6.8 g/dL (ref 6.5–8.1)

## 2023-05-23 LAB — ETHANOL: Alcohol, Ethyl (B): <10 mg/dL (ref <10)

## 2023-05-23 LAB — RAPID URINE DRUG SCREEN, HOSP PERFORMED
Amphetamines: NOT DETECTED
Barbiturates: NOT DETECTED
Benzodiazepines: NOT DETECTED
Cocaine: NOT DETECTED
Opiates: NOT DETECTED
Tetrahydrocannabinol: POSITIVE — AB

## 2023-05-23 LAB — SALICYLATE LEVEL: Salicylate Lvl: <7 mg/dL — ABNORMAL LOW (ref 7.0–30.0)

## 2023-05-23 LAB — CBC
HCT: 44.5 % (ref 39.0–52.0)
Hemoglobin: 14.9 g/dL (ref 13.0–17.0)
MCH: 29.7 pg (ref 26.0–34.0)
MCHC: 33.5 g/dL (ref 30.0–36.0)
MCV: 88.6 fL (ref 80.0–100.0)
Platelets: 213 10*3/uL (ref 150–400)
RBC: 5.02 MIL/uL (ref 4.22–5.81)
RDW: 13.3 % (ref 11.5–15.5)
WBC: 5.8 10*3/uL (ref 4.0–10.5)
nRBC: 0 % (ref 0.0–0.2)

## 2023-05-23 LAB — ACETAMINOPHEN LEVEL: Acetaminophen (Tylenol), Serum: <10 ug/mL — ABNORMAL LOW (ref 10–30)

## 2023-05-23 MED ORDER — ACETAMINOPHEN 325 MG PO TABS: 650.0000 mg | ORAL_TABLET | ORAL | Status: DC | PRN

## 2023-05-23 MED ORDER — MIRTAZAPINE 15 MG PO TABS: 7.5000 mg | ORAL_TABLET | Freq: Every day | ORAL | Status: DC

## 2023-05-23 MED ORDER — HALOPERIDOL 5 MG PO TABS: 5.0000 mg | ORAL_TABLET | Freq: Three times a day (TID) | ORAL | Status: DC | PRN

## 2023-05-23 MED ORDER — DIPHENHYDRAMINE HCL 25 MG PO CAPS: 50.0000 mg | ORAL_CAPSULE | Freq: Three times a day (TID) | ORAL | Status: DC | PRN

## 2023-05-23 MED ORDER — HYDROXYZINE HCL 25 MG PO TABS
25.0000 mg | ORAL_TABLET | Freq: Three times a day (TID) | ORAL | Status: DC | PRN
  Administered 2023-05-23 – 2023-05-31 (×13): 25 mg via ORAL
  Filled 2023-05-23 (×13): qty 1

## 2023-05-23 MED ORDER — MIRTAZAPINE 15 MG PO TABS
7.5000 mg | ORAL_TABLET | Freq: Every day | ORAL | Status: DC
  Administered 2023-05-23 – 2023-05-24 (×2): 7.5 mg via ORAL
  Filled 2023-05-23 (×2): qty 1

## 2023-05-23 MED ORDER — NICOTINE 14 MG/24HR TD PT24
14.0000 mg | MEDICATED_PATCH | Freq: Every day | TRANSDERMAL | Status: DC
  Administered 2023-05-23: 14 mg via TRANSDERMAL
  Filled 2023-05-23: qty 1

## 2023-05-23 MED ORDER — PANTOPRAZOLE SODIUM 40 MG PO TBEC
40.0000 mg | DELAYED_RELEASE_TABLET | Freq: Every day | ORAL | Status: DC
  Administered 2023-05-24 – 2023-05-31 (×8): 40 mg via ORAL
  Filled 2023-05-23 (×8): qty 1

## 2023-05-23 MED ORDER — HYDROXYZINE HCL 25 MG PO TABS
25.0000 mg | ORAL_TABLET | Freq: Once | ORAL | Status: AC
  Administered 2023-05-23: 25 mg via ORAL
  Filled 2023-05-23: qty 1

## 2023-05-23 MED ORDER — DIPHENHYDRAMINE HCL 50 MG/ML IJ SOLN: 50.0000 mg | Freq: Three times a day (TID) | INTRAMUSCULAR | Status: DC | PRN

## 2023-05-23 MED ORDER — FLUOXETINE HCL 20 MG PO CAPS
40.0000 mg | ORAL_CAPSULE | Freq: Every day | ORAL | Status: DC
  Administered 2023-05-23: 40 mg via ORAL
  Filled 2023-05-23: qty 2

## 2023-05-23 MED ORDER — PANTOPRAZOLE SODIUM 40 MG PO TBEC
40.0000 mg | DELAYED_RELEASE_TABLET | Freq: Every day | ORAL | Status: DC
  Administered 2023-05-23: 40 mg via ORAL
  Filled 2023-05-23: qty 1

## 2023-05-23 MED ORDER — HYDROXYZINE HCL 25 MG PO TABS: 25.0000 mg | ORAL_TABLET | Freq: Three times a day (TID) | ORAL | Status: DC | PRN

## 2023-05-23 MED ORDER — NICOTINE 14 MG/24HR TD PT24
14.0000 mg | MEDICATED_PATCH | Freq: Every day | TRANSDERMAL | Status: DC
  Administered 2023-05-24 – 2023-05-31 (×8): 14 mg via TRANSDERMAL
  Filled 2023-05-23 (×8): qty 1

## 2023-05-23 MED ORDER — LORAZEPAM 2 MG/ML IJ SOLN: 2.0000 mg | Freq: Three times a day (TID) | INTRAMUSCULAR | Status: DC | PRN

## 2023-05-23 MED ORDER — TRAZODONE HCL 50 MG PO TABS: 50.0000 mg | ORAL_TABLET | Freq: Every evening | ORAL | Status: DC | PRN

## 2023-05-23 MED ORDER — LORAZEPAM 2 MG PO TABS
2.0000 mg | ORAL_TABLET | Freq: Three times a day (TID) | ORAL | Status: DC | PRN
  Administered 2023-05-29: 2 mg via ORAL
  Filled 2023-05-23: qty 1

## 2023-05-23 MED ORDER — LORAZEPAM 1 MG PO TABS: 1.0000 mg | ORAL_TABLET | ORAL | Status: DC | PRN

## 2023-05-23 MED ORDER — ZIPRASIDONE MESYLATE 20 MG IM SOLR: 20.0000 mg | INTRAMUSCULAR | Status: DC | PRN

## 2023-05-23 MED ORDER — FLUOXETINE HCL 20 MG PO CAPS
40.0000 mg | ORAL_CAPSULE | Freq: Every day | ORAL | Status: DC
  Administered 2023-05-24 – 2023-05-31 (×8): 40 mg via ORAL
  Filled 2023-05-23 (×8): qty 2

## 2023-05-23 MED ORDER — TRAZODONE HCL 50 MG PO TABS
50.0000 mg | ORAL_TABLET | Freq: Every evening | ORAL | Status: DC | PRN
  Administered 2023-05-23 – 2023-05-30 (×7): 50 mg via ORAL
  Filled 2023-05-23 (×7): qty 1

## 2023-05-23 MED ORDER — RISPERIDONE 1 MG PO TBDP: 2.0000 mg | ORAL_TABLET | Freq: Three times a day (TID) | ORAL | Status: DC | PRN

## 2023-05-23 MED ORDER — HALOPERIDOL LACTATE 5 MG/ML IJ SOLN: 5.0000 mg | Freq: Three times a day (TID) | INTRAMUSCULAR | Status: DC | PRN

## 2023-05-23 NOTE — ED Notes (Signed)
Pt pacing. Pt is a/o. Denies avh. States he is depressed. Does have plan to hang self. Pt has scratch marks to arms that are superficial. Pt pleasant and polite. Tearful at times. Pt comforted. Offered meal tray and pt stated he was not hungry. Advised edp of pt and asked for his normal vistaril which pt states helps him a lot. Pt states his sister was supposed to go get his med last week but did not so has been out of his prozac, vistaril and remeron since. States he is fine when on his meds. Pt aware of screening process.

## 2023-05-23 NOTE — BH Assessment (Signed)
@  1610, requested patients nurse, Neysa Bonito, RN to place the TTS machine in patient's room.

## 2023-05-23 NOTE — ED Notes (Signed)
TTS in progress 

## 2023-05-23 NOTE — Tx Team (Signed)
Initial Treatment Plan 05/23/2023 6:19 PM Angel [name] QIO:[id number]    PATIENT STRESSORS: Financial difficulties   Loss of relationships   Marital or family conflict   Medication change or noncompliance   Occupational concerns   Traumatic event     PATIENT STRENGTHS: Ability for insight  Communication skills  General fund of knowledge    PATIENT IDENTIFIED PROBLEMS: Financial   Homelessness  Depression  Medication non compliance  Lack of support systems             DISCHARGE CRITERIA:  Improved stabilization in mood, thinking, and/or behavior Motivation to continue treatment in a less acute level of care Safe-care adequate arrangements made Verbal commitment to aftercare and medication compliance  PRELIMINARY DISCHARGE PLAN: Attend aftercare/continuing care group Outpatient therapy Return to previous living arrangement  PATIENT/FAMILY INVOLVEMENT: This treatment plan has been presented to and reviewed with the patient, Angel [name].  The patient and family have been given the opportunity to ask questions and make suggestions.  Delos Haring, RN 05/23/2023, 6:19 PM

## 2023-05-23 NOTE — BH Assessment (Addendum)
Comprehensive Clinical Assessment (CCA) Note  05/23/2023 Angel [name] [id number]  Disposition: Per Angel Heater, NP, patient meets criteria for inpatient psychiatric treatment.   Chief Complaint:  Chief Complaint  Patient presents with   V70.1   The patient demonstrates the following risk factors for suicide: Chronic risk factors for suicide include: previous self-harm in 2019 . Acute risk factors for suicide include: unemployment. Protective factors for this patient include: coping skills. Considering these factors, the overall suicide risk at this point appears to be high. Patient is not appropriate for outpatient follow up.   Visit Diagnosis:  MDD (major depressive disorder), recurrent severe, without psychosis  GAD  Angel [name]. [employer]" is a with past medical history of depressive disorder, generalized anxiety, migraine headaches who presents to the Emergency Department requesting assistance for suicidal thoughts. States for the last few weeks he has had recurring thoughts of killing himself. States he has a plan to hang himself if "I did not get some help." States that he is suicidal because his sister abandoned him "left me in the middle of street, with my personal belongings, and drove off". According to patient this situation transpired over a week ago. Patient states that he was previously living with his sister. He is unsure why his sister did this to him.   Patient currently has suicidal ideations. Patient unable to identify any protective factors. He has a history of a suicide attempt, he tried to slit his wrist "1-2 years ago". The attempt was triggered by "things going on in my life, I can't remember exactly what that was". Patient with a history of self-injurious behaviors (cutting). Patient states that he used a razor blade to make cuts to both forearms yesterday. Notes that his brother committed suicide. Also, he reports 2 aunts with "mental health issues". Patient denies a  history of trauma and/or abuse.   The patient presents with a range of depressive symptoms, including feelings of hopelessness, worthlessness, fatigue, lack of motivation, irritability, guilt, despondence, neglect of personal hygiene, and insomnia, reporting a sleep duration of 4 hours per night. He also notes a significant weight loss of 30 pounds over a short period due to poor appetite. The patient acknowledges issues with anxiety but denies any history of panic attacks, homicidal thoughts, violent behaviors, legal issues, auditory or visual hallucinations, or symptoms of paranoia.  He admits to recent marijuana use but denies alcohol, cocaine, or other illicit substances. The patient started using marijuana at the age of 53 and reports sporadic use, approximately once every 1-2 months, with his last use occurring 1.5 months ago. Past psychiatric history includes multiple admissions at [facility] (10/31/2013, 05/16/2017, 03/09/2018, 08/28/2018), and he currently receives outpatient therapy and medication management at [facility] in [facility].  The patient is single with no children, currently homeless and residing in abandoned houses. Patient states that he has not support system. He is unemployed and has completed education up to the 10th grade. His hobbies include drawing.   CCA Screening, Triage and Referral (STR)  Patient Reported Information How did you hear about [place]? Self  What Is the Reason for Your Visit/Call Today? Angel [name] is a 42 year old male with history of depression comes in with chief complaint of suicidal ideation. Patient has history of depression, anxiety. He was admitted to the psychiatry service and discharged earlier this month with SI. Patient states that his sister, was is a heroin addict, had patient stay with him but then decided to drop them  off in the [place] of [place]. He has not had access to his medications. The experience has made him suicidal.  He is homeless. He thinks that he was dropped off 2 or 3 days ago in [place].  How Long Has This Been Causing You Problems? 1 wk - 1 month  What Do You Feel Would Help You the Most Today? Treatment for Depression or other mood problem; Medication(s); Stress Management   Have You Recently Had Any Thoughts About Hurting Yourself? Yes  Are You Planning to Commit Suicide/Harm Yourself At This time? Yes   Flowsheet Row ED from 05/23/2023 in [facility] Emergency Department at [facility] Admission (Discharged[facility] from [facility]27/2023 in [facility] Department at [facility]  C-SSRS RISK CATEGORY High Risk High Risk High Risk       Have you Recently Had Thoughts About Hurting Someone Angel [name]? No  Are You Planning to Harm Someone at This Time? No  Explanation: n/a   Have You Used Any Alcohol or Drugs in the Past 24 Hours? No  What Did You Use and How Much? Patient renders conflicting hx at the time of the assessment denies any SA use although reported THC use when first seen by ED provider. Additonally, his UDS is + for THC.   Do You Currently Have a Therapist/Psychiatrist? No  Name of Therapist/Psychiatrist: Name of Therapist/Psychiatrist: n/a   Have You Been Recently Discharged From Any Office Practice or Programs? No  Explanation of Discharge From Practice/Program: n/a     CCA Screening Triage Referral Assessment Type of Contact: Tele-Assessment  Telemedicine Service Delivery:   Is this Initial or Reassessment? Is this Initial or Reassessment?: Initial Assessment  Date Telepsych consult ordered in [facility]:  Date Telepsych consult ordered in [facility]: 05/23/23  Time Telepsych consult ordered in [facility]:  Time Telepsych consult ordered in [facility]: 1300  Location of Assessment: [facility] ED  Provider Location: [facility] Assessment Services   Collateral Involvement: None at this time   Does Patient Have a Production manager Guardian? No  Legal Guardian Contact Information: Patient does not have a legal guardian.  Copy of Legal Guardianship Form: No - copy requested  Legal Guardian Notified of Arrival: -- (Patient does not have a legal guardian.)  Legal Guardian Notified of Pending Discharge: -- (Patient does not have a legal guardian.)  If Minor and Not Living with Parent(s), Who has Custody? n/a  Is CPS involved or ever been involved? Never  Is APS involved or ever been involved? Never   Patient Determined To Be At Risk for Harm To Self or Others Based on Review of Patient Reported Information or Presenting Complaint? No  Method: Plan with intent and identified person  Availability of Means: In hand or used  Intent: Clearly intends on inflicting harm that could cause death  Notification Required: No need or identified person  Additional Information for Danger to Others Potential: Previous attempts  Additional Comments for Danger to Others Potential: None noted  Are There Guns or Other Weapons in Your Home? No  Types of Guns/Weapons: NA  Are These Weapons Safely Secured?                            -- (NA)  Who Could Verify You Are Able To Have These Secured: NA  Do You Have any Outstanding Charges, Pending Court Dates, Parole/Probation? Denies  Contacted  To Inform of Risk of Harm To Self or Others: Other: Comment    Does Patient Present under Involuntary Commitment? No    [place] of Residence: [place]   Patient Currently Receiving the Following Services: -- ([facility] in [place])   Determination of Need: Urgent (48 hours)   Options For Referral: Medication Management; Inpatient Hospitalization     CCA Biopsychosocial Patient Reported Schizophrenia/Schizoaffective Diagnosis in Past: No   Strengths: Patient is willing to participate in treatment and is open to interventions   Mental Health Symptoms Depression:   Change in energy/activity;  Difficulty Concentrating; Hopelessness   Duration of Depressive symptoms:  Duration of Depressive Symptoms: Greater than two weeks   Mania:   None   Anxiety:    Irritability; Restlessness; Difficulty concentrating   Psychosis:   None   Duration of Psychotic symptoms:    Trauma:   None   Obsessions:   None   Compulsions:   None   Inattention:   None   Hyperactivity/Impulsivity:   None   Oppositional/Defiant Behaviors:   None   Emotional Irregularity:   Chronic feelings of emptiness   Other Mood/Personality Symptoms:   None noted    Mental Status Exam Appearance and self-care  Stature:   Average   Weight:   Average weight   Clothing:   Disheveled   Grooming:   Neglected   Cosmetic use:   None   Posture/gait:   Normal   Motor activity:   Not Remarkable   Sensorium  Attention:   Normal   Concentration:   Normal   Orientation:   X5   Recall/memory:   Normal   Affect and Mood  Affect:   Anxious; Depressed   Mood:   Depressed   Relating  Eye contact:   Normal   Facial expression:   Depressed   Attitude toward examiner:   Cooperative   Thought and Language  Speech flow:  Clear and Coherent   Thought content:   Appropriate to Mood and Circumstances   Preoccupation:   None   Hallucinations:   None   Organization:   Intact; Goal-directed   Company secretary of Knowledge:   Fair   Intelligence:   Average   Abstraction:   Normal   Judgement:   Poor   Reality Testing:   Realistic   Insight:   Poor   Decision Making:   Normal   Social Functioning  Social Maturity:   Responsible   Social Judgement:   Normal   Stress  Stressors:   Housing   Coping Ability:   Exhausted   Skill Deficits:   Decision making; Self-control   Supports:   Support needed     Religion: Religion/Spirituality Are You A Religious Person?: No How Might This Affect Treatment?:  NA  Leisure/Recreation: Leisure / Recreation Do You Have Hobbies?: No  Exercise/Diet: Exercise/Diet Do You Exercise?: No Have You Gained or Lost A Significant Amount of Weight in the Past Six Months?: Yes-Lost (30 pounds in the past several months) Do You Follow a Special Diet?: No Do You Have Any Trouble Sleeping?: Yes Explanation of Sleeping Difficulties: 4-5 hrs per night.   CCA Employment/Education Employment/Work Situation: Employment / Work Situation Employment Situation: Unemployed Patient's Job has Been Impacted by Current Illness: No Has Patient ever Been in Equities trader?: No  Education: Education Is Patient Currently Attending School?: No Last Grade Completed: 12 Did You Product manager?: No Did You Have An Individualized Education Program (IIEP): No  Did You Have Any Difficulty At School?: No Patient's Education Has Been Impacted by Current Illness: No   CCA Family/Childhood History Family and Relationship History: Family history Marital status: Single Does patient have children?: No  Childhood History:  Childhood History By whom was/is the patient raised?: Both parents Did patient suffer any verbal/emotional/physical/sexual abuse as a child?: No Did patient suffer from severe childhood neglect?: No Has patient ever been sexually abused/assaulted/raped as an adolescent or adult?: No Was the patient ever a victim of a crime or a disaster?: No Witnessed domestic violence?: No Has patient been affected by domestic violence as an adult?: No       CCA Substance Use Alcohol/Drug Use:                           ASAM's:  Six Dimensions of Multidimensional Assessment  Dimension 1:  Acute Intoxication and/or Withdrawal Potential:      Dimension 2:  Biomedical Conditions and Complications:      Dimension 3:  Emotional, Behavioral, or Cognitive Conditions and Complications:     Dimension 4:  Readiness to Change:     Dimension 5:  Relapse,  Continued use, or Continued Problem Potential:     Dimension 6:  Recovery/Living Environment:     ASAM Severity Score:    ASAM Recommended Level of Treatment:     Substance use Disorder (SUD)    Recommendations for Services/Supports/Treatments:    Discharge Disposition:    DSM5 Diagnoses: Patient Active Problem List   Diagnosis Date Noted   GERD (gastroesophageal reflux disease) 03/11/2018   Tobacco use disorder 07/17/2015   Generalized anxiety disorder 11/01/2013   MDD (major depressive disorder), recurrent severe, without psychosis (HCC) 10/31/2013     Referrals to Alternative Service(s): Referred to Alternative Service(s):   Place:   Date:   Time:    Referred to Alternative Service(s):   Place:   Date:   Time:    Referred to Alternative Service(s):   Place:   Date:   Time:    Referred to Alternative Service(s):   Place:   Date:   Time:     Melynda Ripple, Counselor

## 2023-05-23 NOTE — ED Triage Notes (Signed)
Pt comes in with SI/HI and depression. Pt states recently living with sister who took him to [place] and kicked him out of the house. Pt states plan to "hang" himself.

## 2023-05-23 NOTE — ED Notes (Signed)
Pt dc with safe hands with all belongings. Nad. Cooperative and pleasant

## 2023-05-23 NOTE — ED Provider Notes (Signed)
[place] EMERGENCY DEPARTMENT AT [facility] Provider Note   CSN: [id number] Arrival date & time: 05/23/23  7253     History  Chief Complaint  Patient presents with   V70.1    Angel [name] is a 42 y.o. male.  HPI    42 year old male with history of depression comes in with chief complaint of suicidal ideation.  Patient has history of depression, anxiety.  He was admitted to the psychiatry service and discharged earlier this month with SI.  Patient states that his sister, was is a heroin addict, had patient stay with him but then decided to drop them off in the middle of [place].  He has not had access to his medications.  The experience has made him suicidal.  He is homeless.  He thinks that he was dropped off 2 or 3 days ago in [place].  Home Medications Prior to Admission medications   Medication Sig Start Date End Date Taking? Authorizing Provider  FLUoxetine (PROZAC) 40 MG capsule Take 1 capsule (40 mg total) by mouth daily. 05/04/23 06/03/23  Princess Bruins, DO  hydrOXYzine (ATARAX) 25 MG tablet Take 1 tablet (25 mg total) by mouth 3 (three) times daily as needed for anxiety or vomiting (sleep, anger). 05/03/23 06/02/23  Princess Bruins, DO  mirtazapine (REMERON) 7.5 MG tablet Take 1 tablet (7.5 mg total) by mouth at bedtime. 05/03/23 06/02/23  Princess Bruins, DO  nicotine (NICODERM CQ - DOSED IN MG/24 HOURS) 14 mg/24hr patch Place 1 patch (14 mg total) onto the skin daily. 05/04/23   Princess Bruins, DO  pantoprazole (PROTONIX) 20 MG tablet Take 1 tablet (20 mg total) by mouth daily. 05/04/23 06/03/23  Princess Bruins, DO  traZODone (DESYREL) 50 MG tablet Take 1 tablet (50 mg total) by mouth at bedtime as needed for sleep. 05/03/23 06/02/23  Princess Bruins, DO      Allergies    Patient has no known allergies.    Review of Systems   Review of Systems  All other systems reviewed and are negative.   Physical Exam Updated Vital Signs BP 102/85 (BP Location: Right Arm)   Pulse  87   Temp 97.8 F (36.6 C) (Oral)   Resp 20   Ht 5\' 8"  (1.727 m)   Wt 67.1 kg   SpO2 97%   BMI 22.50 kg/m  Physical Exam Vitals and nursing note reviewed.  Constitutional:      Appearance: He is well-developed.  HENT:     Head: Atraumatic.  Cardiovascular:     Rate and Rhythm: Normal rate.  Pulmonary:     Effort: Pulmonary effort is normal.  Musculoskeletal:     Cervical back: Neck supple.  Skin:    General: Skin is warm.  Neurological:     Mental Status: He is alert and oriented to person, place, and time.  Psychiatric:        Attention and Perception: Attention normal.        Mood and Affect: Mood is anxious.        Speech: Speech normal.        Behavior: Behavior is hyperactive. Behavior is cooperative.        Thought Content: Thought content includes suicidal ideation. Thought content includes suicidal plan.        Cognition and Memory: Cognition normal.        Judgment: Judgment normal.     ED Results / Procedures / Treatments   Labs (all labs ordered are listed, but  only abnormal results are displayed) Labs Reviewed  COMPREHENSIVE METABOLIC PANEL - Abnormal; Notable for the following components:      Result Value   Glucose, Bld 111 (*)    ALT 45 (*)    All other components within normal limits  RAPID URINE DRUG SCREEN, HOSP PERFORMED - Abnormal; Notable for the following components:   Tetrahydrocannabinol POSITIVE (*)    All other components within normal limits  CBC  ETHANOL  SALICYLATE LEVEL  ACETAMINOPHEN LEVEL    EKG None  Radiology No results found.  Procedures Procedures    Medications Ordered in ED Medications  risperiDONE (RISPERDAL M-TABS) disintegrating tablet 2 mg (has no administration in time range)    And  LORazepam (ATIVAN) tablet 1 mg (has no administration in time range)    And  ziprasidone (GEODON) injection 20 mg (has no administration in time range)  acetaminophen (TYLENOL) tablet 650 mg (has no administration in time  range)  mirtazapine (REMERON) tablet 7.5 mg (has no administration in time range)  nicotine (NICODERM CQ - dosed in mg/24 hours) patch 14 mg (has no administration in time range)  pantoprazole (PROTONIX) EC tablet 40 mg (has no administration in time range)  traZODone (DESYREL) tablet 50 mg (has no administration in time range)  hydrOXYzine (ATARAX) tablet 25 mg (has no administration in time range)  FLUoxetine (PROZAC) capsule 40 mg (has no administration in time range)  hydrOXYzine (ATARAX) tablet 25 mg (25 mg Oral Given 05/23/23 0908)    ED Course/ Medical Decision Making/ A&P                             Medical Decision Making Amount and/or Complexity of Data Reviewed Labs: ordered.  Risk OTC drugs. Prescription drug management.   42 year old patient comes in with chief complaint of suicidal ideation.  He indicates that he has history of depression, anxiety and was kicked out by his sister few days back, this has triggered his depression and made him suicidal.  He is also not taking his medications.  Differential diagnosis for him includes: Depression Substance abuse Suicidal ideation Acute withdrawal Homelessness  Social determinants of health for this patient includes homelessness.  I reviewed patient's recent discharge summary.   I feel will be best to get patient to be assessed by psychiatry service them, and to ensure that he gets medications prescribed again and perhaps additional resources can be provided for him to do well.  Final Clinical Impression(s) / ED Diagnoses Final diagnoses:  Suicidal ideation    Rx / DC Orders ED Discharge Orders     None         Derwood Kaplan, MD 05/23/23 [phone]

## 2023-05-23 NOTE — Progress Notes (Addendum)
Pt was accepted to [facility] Address: [address] [place], [place] [address] 05/23/2023; Bed Assignment 307 PENDING signed vol consent faxed to [facility] fax [phone]  DX:MDD, SI  Pt meets inpatient criteria per Lissa Hoard  Attending Physician will be [facility].   Report can be called to: - [phone]   Pt can arrive after: [facility] charge RN to coordinate after 8pm.  Care Team notified:Day [facility] Malva Limes, RN, [address], Jasper Hendra,LCSW, Brianne Ore,Paramedic   [place], [place] 05/23/2023 @ 1:15 PM

## 2023-05-23 NOTE — ED Notes (Signed)
Pt wanded security at this time.

## 2023-05-23 NOTE — Plan of Care (Signed)
Problem: Education: Goal: Knowledge of General Education information will improve Description: Including pain rating scale, medication(s)/side effects and non-pharmacologic comfort measures Outcome: Not Progressing   Problem: Health Behavior/Discharge Planning: Goal: Ability to manage health-related needs will improve Outcome: Not Progressing   Problem: Clinical Measurements: Goal: Ability to maintain clinical measurements within normal limits will improve Outcome: Not Progressing Goal: Will remain free from infection Outcome: Not Progressing Goal: Diagnostic test results will improve Outcome: Not Progressing Goal: Respiratory complications will improve Outcome: Not Progressing Goal: Cardiovascular complication will be avoided Outcome: Not Progressing   Problem: Activity: Goal: Risk for activity intolerance will decrease Outcome: Not Progressing   Problem: Nutrition: Goal: Adequate nutrition will be maintained Outcome: Not Progressing   Problem: Coping: Goal: Level of anxiety will decrease Outcome: Not Progressing   Problem: Elimination: Goal: Will not experience complications related to bowel motility Outcome: Not Progressing Goal: Will not experience complications related to urinary retention Outcome: Not Progressing   Problem: Pain Managment: Goal: General experience of comfort will improve Outcome: Not Progressing   Problem: Safety: Goal: Ability to remain free from injury will improve Outcome: Not Progressing   Problem: Skin Integrity: Goal: Risk for impaired skin integrity will decrease Outcome: Not Progressing   Problem: Education: Goal: Knowledge of [place] General Education information/materials will improve Outcome: Not Progressing Goal: Emotional status will improve Outcome: Not Progressing Goal: Mental status will improve Outcome: Not Progressing Goal: Verbalization of understanding the information provided will improve Outcome: Not  Progressing   Problem: Activity: Goal: Interest or engagement in activities will improve Outcome: Not Progressing Goal: Sleeping patterns will improve Outcome: Not Progressing   Problem: Coping: Goal: Ability to verbalize frustrations and anger appropriately will improve Outcome: Not Progressing Goal: Ability to demonstrate self-control will improve Outcome: Not Progressing   Problem: Health Behavior/Discharge Planning: Goal: Identification of resources available to assist in meeting health care needs will improve Outcome: Not Progressing Goal: Compliance with treatment plan for underlying cause of condition will improve Outcome: Not Progressing   Problem: Physical Regulation: Goal: Ability to maintain clinical measurements within normal limits will improve Outcome: Not Progressing   Problem: Safety: Goal: Periods of time without injury will increase Outcome: Not Progressing   Problem: Education: Goal: Ability to make informed decisions regarding treatment will improve Outcome: Not Progressing   Problem: Coping: Goal: Coping ability will improve Outcome: Not Progressing   Problem: Health Behavior/Discharge Planning: Goal: Identification of resources available to assist in meeting health care needs will improve Outcome: Not Progressing   Problem: Medication: Goal: Compliance with prescribed medication regimen will improve Outcome: Not Progressing   Problem: Self-Concept: Goal: Ability to disclose and discuss suicidal ideas will improve Outcome: Not Progressing Goal: Will verbalize positive feelings about self Outcome: Not Progressing   Problem: Education: Goal: Utilization of techniques to improve thought processes will improve Outcome: Not Progressing Goal: Knowledge of the prescribed therapeutic regimen will improve Outcome: Not Progressing   Problem: Activity: Goal: Interest or engagement in leisure activities will improve Outcome: Not Progressing Goal:  Imbalance in normal sleep/wake cycle will improve Outcome: Not Progressing   Problem: Coping: Goal: Coping ability will improve Outcome: Not Progressing Goal: Will verbalize feelings Outcome: Not Progressing   Problem: Health Behavior/Discharge Planning: Goal: Ability to make decisions will improve Outcome: Not Progressing Goal: Compliance with therapeutic regimen will improve Outcome: Not Progressing   Problem: Role Relationship: Goal: Will demonstrate positive changes in social behaviors and relationships Outcome: Not Progressing   Problem: Safety: Goal: Ability to disclose   and discuss suicidal ideas will improve Outcome: Not Progressing Goal: Ability to identify and utilize support systems that promote safety will improve Outcome: Not Progressing   Problem: Self-Concept: Goal: Will verbalize positive feelings about self Outcome: Not Progressing Goal: Level of anxiety will decrease Outcome: Not Progressing   

## 2023-05-23 NOTE — Progress Notes (Signed)
Admission note:  Consents signed, literature detailing the patient's rights, responsibilities, and visitor guidelines provided. Skin search with self inflicted scratches to lower arms bilaterally. Instructed on washing with warm water and soap and using barrier cream to protect. Skin check otherwise unremarkable. Belongings search completed with no contraband. He endorses depression and anxiety He also has suicidal ideation but no plan at present. Earlier he had plan to hang himself and "even had the rope to do it" . States his sister took him to a parking lot and dropped him off a few days ago; just left him. States he was living with her previously. He was oriented to his room and the unit. No questions or concerns at present. He was given toiletries and personal care items. He has no questions or concerns at present. He contracts for safety and remains safe on the unit at this time.

## 2023-05-24 DIAGNOSIS — F332 Major depressive disorder, recurrent severe without psychotic features: Secondary | ICD-10-CM | POA: Diagnosis not present

## 2023-05-24 MED ORDER — ENSURE ENLIVE PO LIQD: 237.0000 mL | Freq: Two times a day (BID) | ORAL | Status: DC

## 2023-05-24 MED ORDER — ENSURE ENLIVE PO LIQD
237.0000 mL | Freq: Three times a day (TID) | ORAL | Status: DC
  Administered 2023-05-24 – 2023-05-31 (×17): 237 mL via ORAL

## 2023-05-24 MED ORDER — ADULT MULTIVITAMIN W/MINERALS CH
1.0000 | ORAL_TABLET | Freq: Every day | ORAL | Status: DC
  Administered 2023-05-24 – 2023-05-31 (×8): 1 via ORAL
  Filled 2023-05-24 (×7): qty 1

## 2023-05-24 MED ORDER — RISPERIDONE 1 MG PO TABS: 0.5000 mg | ORAL_TABLET | ORAL | Status: DC

## 2023-05-24 NOTE — Progress Notes (Signed)
Patient calm and cooperative this shift. Denies current SI/HI/AVH but appears to be responding to internal stimuli at times. Contracts for safety on unit. Denies pain at present.

## 2023-05-24 NOTE — Progress Notes (Addendum)
Suicide Risk Assessment  Admission Assessment    [facility] Admission Suicide Risk Assessment   Nursing information obtained from:    Demographic factors:  Male, Caucasian, Low socioeconomic status, Living alone Current Mental Status:  Suicidal ideation indicated by patient Loss Factors:  Decrease in vocational status, Loss of significant relationship, Financial problems / change in socioeconomic status Historical Factors:  Family history of suicide, Prior suicide attempts Risk Reduction Factors:  NA  Total Time spent with patient: 1 hour Principal Problem: MDD (major depressive disorder), recurrent severe, without psychosis (HCC) Diagnosis:  Principal Problem:   MDD (major depressive disorder), recurrent severe, without psychosis (HCC)  Subjective Data: 42 y/o male w/ history of mdd, gad, tobacco use disorder admitted to inpatient psychiatry as a transfer from [facility] where he had presented for suicidal ideations with a plan to hang himself.  Continued Clinical Symptoms:  Alcohol Use Disorder Identification Test Final Score (AUDIT): 0 The "Alcohol Use Disorders Identification Test", Guidelines for Use in Primary Care, Second Edition.  World Science writer [facility]). Score between 0-7:  no or low risk or alcohol related problems. Score between 8-15:  moderate risk of alcohol related problems. Score between 16-19:  high risk of alcohol related problems. Score 20 or above:  warrants further diagnostic evaluation for alcohol dependence and treatment.   CLINICAL FACTORS:   Depression:   Anhedonia Hopelessness More than one psychiatric diagnosis Previous Psychiatric Diagnoses and Treatments   Musculoskeletal: Strength & Muscle Tone:  patient lying down on assessment Gait & Station:  patient lying down on assessment Patient leans:  patient lying down on assessment  Psychiatric Specialty Exam:  Presentation  General Appearance:  Appropriate for Environment; Disheveled; Other (comment)  (dressed in hospital scrubs)  Eye Contact: Good  Speech: Clear and Coherent; Normal Rate  Speech Volume: Normal  Handedness: Right   Mood and Affect  Mood: Depressed; Anxious  Affect: Blunt   Thought Process  Thought Processes: Coherent; Goal Directed; Linear  Descriptions of Associations:Intact  Orientation:Full (Time, Place and Person)  Thought Content:Logical  History of Schizophrenia/Schizoaffective disorder:No  Duration of Psychotic Symptoms:No data recorded Hallucinations:Hallucinations: None  Ideas of Reference:None  Suicidal Thoughts:Suicidal Thoughts: Yes, Active SI Active Intent and/or Plan: With Plan  Homicidal Thoughts:Homicidal Thoughts: No   Sensorium  Memory: Immediate Good  Judgment: Fair  Insight: Fair   Chartered certified accountant: Fair  Attention Span: Fair  Recall: Fair  Fund of Knowledge: Fair  Language: Fair   Psychomotor Activity  Psychomotor Activity:Psychomotor Activity: Normal   Assets  Assets: Manufacturing systems engineer; Desire for Improvement; Financial Resources/Insurance; Resilience   Sleep  Sleep:Sleep: Poor    Physical Exam: Physical Exam see admission note ROS see admission note Blood pressure 104/75, pulse 73, temperature 98.3 F (36.8 C), temperature source Oral, resp. rate 18, height 5\' 8"  (1.727 m), weight 65.8 kg, SpO2 100 %. Body mass index is 22.05 kg/m.   COGNITIVE FEATURES THAT CONTRIBUTE TO RISK:  None    SUICIDE RISK:  Severe:  Frequent, intense, and enduring suicidal ideation, specific plan, no subjective intent, but some objective markers of intent (i.e., choice of lethal method), the method is accessible, some limited preparatory behavior, evidence of impaired self-control, severe dysphoria/symptomatology, multiple risk factors present, and few if any protective factors, particularly a lack of social support.  PLAN OF CARE:  Daily contact with patient to assess and  evaluate symptoms and progress in treatment Medication management Social work to discuss safe discharge planning  I certify that inpatient services  furnished can reasonably be expected to improve the patient's condition.   Lauree Chandler, NP 05/24/2023, 2:56 PM

## 2023-05-24 NOTE — Group Note (Signed)
Recreation Therapy Group Note   Group Topic:Healthy Support Systems  Group Date: 05/24/2023 Start Time: 1000 End Time: 1055 Facilitators: Rosina Lowenstein, LRT, CTRS Location:  Craft Room  Group Description: Straw Bridge.  Patients were given 10 plastic drinking straws and an equal length of masking tape. Using the materials provided, patients were instructed to build a free-standing bridge-like structure to suspend an everyday item (ex: deck of cards) off the floor or table surface. All materials were required to be used in Secondary school teacher. LRT facilitated post-activity discussion reviewing the importance of having strong and healthy support systems in our lives. LRT discussed how the people in our lives serve as the tape and the deck of cards we placed on top of our straw structure are the stressors we face in daily life. LRT and pts discussed what happens in our life when things get too heavy for [place], and we don't have strong supports outside of the hospital. Pt shared 2 of their healthy supports aloud in the group.   Goal Area(s) Addressed:  Patient will identify 2 healthy supports in their life. Patient will identify skills to successfully complete activity. Patient will identify correlation of this activity to life post-discharge.  Patient will work on Product manager.   Affect/Mood: Flat   Participation Level: Minimal   Participation Quality: Independent   Behavior: Calm and Cooperative   Speech/Thought Process: Coherent   Insight: Fair   Judgement: Fair    Modes of Intervention: Activity   Patient Response to Interventions:  Attentive and Receptive   Education Outcome:  Acknowledges education   Clinical Observations/Individualized Feedback: [name] was active in their participation of session activities and group discussion. Pt identified "my dog" as his only healthy support in his life. Pt did not interact with peers and only to LRT when asked a direct question.  Pt was calm and still duration of session.   Plan: Continue to engage patient in RT group sessions 2-3x/week.   Rosina Lowenstein, LRT, CTRS 05/24/2023 11:19 AM

## 2023-05-24 NOTE — Progress Notes (Signed)
NUTRITION ASSESSMENT  Pt identified as at risk on the Malnutrition Screen Tool  INTERVENTION:  -Increase Ensure Enlive po to TID, each supplement provides 350 kcal and 20 grams of protein -Continue with regular diet -MVI with minerals daily  NUTRITION DIAGNOSIS: Unintentional weight loss related to sub-optimal intake as evidenced by pt report.   Goal: Pt to meet >/= 90% of their estimated nutrition needs.  Monitor:  PO intake  Assessment:  Pt with past medical history of depressive disorder, generalized anxiety, migraine headaches who presents to the Emergency Department requesting assistance for suicidal thoughts.   Pt admitted with SI.   Per psychiatry notes, pt with significant social stressors. He is currently homeless and living in an abandoned house.   He is currently on a regular diet. No meal completion data available to assess at this time. He has Ensure supplement ordered BID.   Reviewed wt hx; pt has experienced a 2% wt loss over the past month, which is not significant for time frame.   Pt is at high risk for malnutrition, however, unable to identify at this time. Suspect diet of poor nutritional quality and/or food insecurity PTA. Pt would greatly benefit from addition of oral nutrition supplements.   Medications reviewed and include remeron and prozac.   Labs reviewed.   42 y.o. male  Height: Ht Readings from Last 1 Encounters:  05/23/23 5\' 8"  (1.727 m)    Weight: Wt Readings from Last 1 Encounters:  05/23/23 65.8 kg    Weight Hx: Wt Readings from Last 10 Encounters:  05/23/23 65.8 kg  05/23/23 67.1 kg  04/28/23 67.2 kg  04/27/23 67.2 kg  10/08/21 76.7 kg  01/12/20 78.9 kg  08/28/18 77.1 kg  08/27/18 77.1 kg  03/09/18 81.2 kg  03/09/18 81.6 kg    BMI:  Body mass index is 22.05 kg/m. BMI WDL.   Estimated Nutritional Needs: Kcal: 25-30 kcal/kg Protein: > 1 gram protein/kg Fluid: 1 ml/kcal  Diet Order:  Diet Order             Diet  regular Room service appropriate? Yes; Fluid consistency: Thin  Diet effective now                  Pt is also offered choice of unit snacks mid-morning and mid-afternoon.  Pt is eating as desired.   Lab results and medications reviewed.   Levada Schilling, RD, LDN, CDCES Registered Dietitian II Certified Diabetes Care and Education Specialist Please refer to [facility] for RD and/or RD on-call/weekend/after hours pager

## 2023-05-24 NOTE — Plan of Care (Signed)

## 2023-05-24 NOTE — H&P (Signed)
Psychiatric Admission Assessment Adult  Patient Identification: Angel [name] MRN:  [id number] Date of Evaluation:  05/24/2023 Chief Complaint:  MDD (major depressive disorder), recurrent severe, without psychosis (HCC) [F33.2] Principal Diagnosis: MDD (major depressive disorder), recurrent severe, without psychosis (HCC) Diagnosis:  Principal Problem:   MDD (major depressive disorder), recurrent severe, without psychosis (HCC)  History of Present Illness:  Patient chart reviewed. Patient is a 42 y/o male w/ history of MDD, GAD, tobacco use disorder admitted to inpatient psychiatry as a transfer from [facility], where he had presented for suicidal ideations with a plan to hang himself if "I did not get some help". Had reported his sister abandoned him "left me in the middle of street, with my personal belongings, and drove off". He has had several inpatient psychiatric hospitalizations, last earlier this month at [facility] from 04/28/23-05/03/23.  Patient reports he has been "depressed for quite a while", reports worsening for the past couple of weeks following his discharge from [facility]. Reports he took his medications for about 3 days following discharge but was not able to pick up his prescriptions has has not taken them since. He states he feels he just wants to die. He is reporting poor sleep and appetite. He reports he has been experiencing suicidal ideations for the past couple of days. He continues to experience suicidal ideations w/ plan to hang himself. He states current stressor is homelessness. He is able to contract for safety in the hospital. He denies homicidal ideations. He denies auditory visual hallucinations or paranoia.  He reports using cigarettes 1ppd, and marijuana "here and there", last use was 1 to 2 months ago. He denies use of crack/cocaine, methamphetamines, opioids, alcohol.   Patient endorses "couple" of suicide attempts. Reports last suicide attempt was "couple months ago" when he tried  to slit his wrist. Patient endorses history of non suicidal self injurious behavior, cutting himself, last occurring couple of days ago. He has lacerations b/l on his upper extremities. No active bleeding or drainage.   Associated Signs/Symptoms: Depression Symptoms:  depressed mood, anhedonia, insomnia, fatigue, feelings of worthlessness/guilt, difficulty concentrating, hopelessness, suicidal thoughts with specific plan, loss of energy/fatigue, decreased appetite, (Hypo) Manic Symptoms:   Not applicable Anxiety Symptoms:  Excessive Worry, Psychotic Symptoms:   Not applicable PTSD Symptoms: NA Total Time spent with patient: 1 hour  Past Psychiatric History: MDD, GAD, tobacco use disorder  Is the patient at risk to self? Yes.    Has the patient been a risk to self in the past 6 months? Yes.    Has the patient been a risk to self within the distant past? Yes.    Is the patient a risk to others? No.  Has the patient been a risk to others in the past 6 months? No.  Has the patient been a risk to others within the distant past? No.   [place] Scale:  Flowsheet Row Admission (Current) from 05/23/2023 in [facility] INPATIENT BEH[facility]AL MEDICINE Most recent reading at 05/23/2023  6:00 PM ED from 05/23/2023 in [facility] Emergency Department at [facility] Most recent reading at 05/23/2023  8:45 AM [facility] Most recent reading at 04/28/2023  2:55 AM  C-SSRS RISK CATEGORY High Risk High Risk High Risk        Prior Inpatient Therapy: Yes.   Multiple inpatient psychiatric hospitalizations, last at [facility] from 04/28/23-05/03/23. Prior Outpatient Therapy: Yes.   Had been to [facility] before.  Alcohol Screening: 1.  How often do you have a drink containing alcohol?: Never 2. How many drinks containing alcohol do you have on a typical day when you are drinking?: 1 or 2 3. How often do you have six or more drinks on one  occasion?: Never AUDIT-C Score: 0 4. How often during the last year have you found that you were not able to stop drinking once you had started?: Never 5. How often during the last year have you failed to do what was normally expected from you because of drinking?: Never 6. How often during the last year have you needed a first drink in the morning to get yourself going after a heavy drinking session?: Never 7. How often during the last year have you had a feeling of guilt of remorse after drinking?: Never 8. How often during the last year have you been unable to remember what happened the night before because you had been drinking?: Never 9. Have you or someone else been injured as a result of your drinking?: No 10. Has a relative or friend or a doctor or another health worker been concerned about your drinking or suggested you cut down?: No Alcohol Use Disorder Identification Test Final Score (AUDIT): 0 Alcohol Brief Interventions/Follow-up: Alcohol education/Brief advice Substance Abuse History in the last 12 months:  Yes.   Consequences of Substance Abuse: NA Previous Psychotropic Medications: Yes  Psychological Evaluations: Yes  Past Medical History:  Past Medical History:  Diagnosis Date   Anxiety    Depression    History of admission to inpatient psychiatry department 05/03/2023   Migraine    Suicidal ideation 05/03/2023   History reviewed. No pertinent surgical history. Family History:  Family History  Problem Relation Age of Onset   Cancer Mother        brain   Diabetes Brother    Hypertension Brother    Family Psychiatric  History: None reported Tobacco Screening:  Social History   Tobacco Use  Smoking Status Every Day   Packs/day: 1.50   Years: 20.00   Additional pack years: 0.00   Total pack years: 30.00   Types: Cigarettes  Smokeless Tobacco Never    BH Tobacco Counseling     Are you interested in Tobacco Cessation Medications?  Yes, implement Nicotene  Replacement Protocol Counseled patient on smoking cessation:  Yes Reason Tobacco Screening Not Completed: No value filed.       Social History:  Social History   Substance and Sexual Activity  Alcohol Use No     Social History   Substance and Sexual Activity  Drug Use Yes   Types: Marijuana    Additional Social History:                           Allergies:  No Known Allergies Lab Results:  Results for orders placed or performed during the hospital encounter of 05/23/23 (from the past 48 hour(s))  Rapid urine drug screen (hospital performed)     Status: Abnormal   Collection Time: 05/23/23  8:50 AM  Result Value Ref Range   Opiates NONE DETECTED NONE DETECTED   Cocaine NONE DETECTED NONE DETECTED   Benzodiazepines NONE DETECTED NONE DETECTED   Amphetamines NONE DETECTED NONE DETECTED   Tetrahydrocannabinol POSITIVE (A) NONE DETECTED   Barbiturates NONE DETECTED NONE DETECTED    Comment: (NOTE) DRUG SCREEN FOR MEDICAL PURPOSES ONLY.  IF CONFIRMATION IS NEEDED FOR ANY PURPOSE, NOTIFY LAB WITHIN 5 DAYS.  LOWEST DETECTABLE LIMITS FOR URINE DRUG SCREEN Drug Class                     Cutoff (ng/mL) Amphetamine and metabolites    1000 Barbiturate and metabolites    200 Benzodiazepine                 200 Opiates and metabolites        300 Cocaine and metabolites        300 THC                            50 Performed at [facility], [address] [place], [place] [address]   Comprehensive metabolic panel     Status: Abnormal   Collection Time: 05/23/23  9:21 AM  Result Value Ref Range   Sodium 137 135 - 145 mmol/L   Potassium 3.5 3.5 - 5.1 mmol/L   Chloride 105 98 - 111 mmol/L   CO2 23 22 - 32 mmol/L   Glucose, Bld 111 (H) 70 - 99 mg/dL    Comment: Glucose reference range applies only to samples taken after fasting for at least 8 hours.   BUN 12 6 - 20 mg/dL   Creatinine, Ser 6.04 0.61 - 1.24 mg/dL   Calcium 8.9 8.9 - 54.0 mg/dL   Total Protein 6.8  6.5 - 8.1 g/dL   Albumin 4.0 3.5 - 5.0 g/dL   AST 32 15 - 41 U/L   ALT 45 (H) 0 - 44 U/L   Alkaline Phosphatase 58 38 - 126 U/L   Total Bilirubin 0.6 0.3 - 1.2 mg/dL   GFR, Estimated >98 >11 mL/min    Comment: (NOTE) Calculated using the CKD-EPI Creatinine Equation (2021)    Anion gap 9 5 - 15    Comment: Performed at [facility], [address], [place], [place] [address]  Ethanol     Status: None   Collection Time: 05/23/23  9:21 AM  Result Value Ref Range   Alcohol, Ethyl (B) <10 <10 mg/dL    Comment: (NOTE) Lowest detectable limit for serum alcohol is 10 mg/dL.  For medical purposes only. Performed at [facility], [address] [place], [place] [address]   Salicylate level     Status: Abnormal   Collection Time: 05/23/23  9:21 AM  Result Value Ref Range   Salicylate Lvl <7.0 (L) 7.0 - 30.0 mg/dL    Comment: Performed at [facility], [address] [place], [place] [address]  Acetaminophen level     Status: Abnormal   Collection Time: 05/23/23  9:21 AM  Result Value Ref Range   Acetaminophen (Tylenol), Serum <10 (L) 10 - 30 ug/mL    Comment: (NOTE) Therapeutic concentrations vary significantly. A range of 10-30 ug/mL  may be an effective concentration for many patients. However, some  are best treated at concentrations outside of this range. Acetaminophen concentrations >150 ug/mL at 4 hours after ingestion  and >50 ug/mL at 12 hours after ingestion are often associated with  toxic reactions.  Performed at [facility], [address], [place], [place] [address]   cbc     Status: None   Collection Time: 05/23/23  9:21 AM  Result Value Ref Range   WBC 5.8 4.0 - 10.5 K/uL   RBC 5.02 4.22 - 5.81 MIL/uL   Hemoglobin 14.9 13.0 - 17.0 g/dL   HCT 96.2 95.2 - 84.1 %   MCV  88.6 80.0 - 100.0 fL   MCH 29.7 26.0 - 34.0 pg   MCHC 33.5 30.0 - 36.0 g/dL   RDW 16.1 09.6 - 04.5 %   Platelets 213 150 - 400 K/uL   nRBC 0.0 0.0 - 0.2 %    Comment: Performed at [facility], [address], [place], [place] [address]    Blood Alcohol level:  Lab Results  Component Value Date   [facility] <10 05/23/2023   ETH <10 04/27/2023    Metabolic Disorder Labs:  Lab Results  Component Value Date   HGBA1C 5.3 04/30/2023   MPG 105 04/30/2023   MPG 105 08/28/2018   Lab Results  Component Value Date   PROLACTIN 6.7 08/28/2018   PROLACTIN 17.3 (H) 05/17/2017   Lab Results  Component Value Date   CHOL 168 04/30/2023   TRIG 108 04/30/2023   HDL 55 04/30/2023   CHOLHDL 3.1 04/30/2023   VLDL 22 04/30/2023   LDLCALC 91 04/30/2023   LDLCALC 104 (H) 08/28/2018    Current Medications: Current Facility-Administered Medications  Medication Dose Route Frequency Provider Last Rate Last Admin   acetaminophen (TYLENOL) tablet 650 mg  650 mg Oral Q4H PRN Lenard Lance, FNP       diphenhydrAMINE (BENADRYL) capsule 50 mg  50 mg Oral TID PRN Lenard Lance, FNP       Or   diphenhydrAMINE (BENADRYL) injection 50 mg  50 mg Intramuscular TID PRN Lenard Lance, FNP       feeding supplement (ENSURE ENLIVE / ENSURE PLUS) liquid 237 mL  237 mL Oral TID BM Reggie Pile, MD   237 mL at 05/24/23 1040   FLUoxetine (PROZAC) capsule 40 mg  40 mg Oral Daily Lenard Lance, FNP   40 mg at 05/24/23 1914   haloperidol (HALDOL) tablet 5 mg  5 mg Oral TID PRN Lenard Lance, FNP       Or   haloperidol lactate (HALDOL) injection 5 mg  5 mg Intramuscular TID PRN Lenard Lance, FNP       hydrOXYzine (ATARAX) tablet 25 mg  25 mg Oral TID PRN Lenard Lance, FNP   25 mg at 05/24/23 0825   LORazepam (ATIVAN) tablet 2 mg  2 mg Oral TID PRN Lenard Lance, FNP       Or   LORazepam (ATIVAN) injection 2 mg  2 mg Intramuscular TID PRN Lenard Lance, FNP       mirtazapine (REMERON) tablet 7.5 mg  7.5 mg Oral QHS Lenard Lance, FNP   7.5 mg at 05/23/23 2131   multivitamin with minerals tablet 1 tablet  1 tablet Oral Daily Reggie Pile, MD   1 tablet at 05/24/23 1040   nicotine (NICODERM CQ - dosed in mg/24  hours) patch 14 mg  14 mg Transdermal Daily Lenard Lance, FNP   14 mg at 05/24/23 0823   pantoprazole (PROTONIX) EC tablet 40 mg  40 mg Oral Daily Lenard Lance, FNP   40 mg at 05/24/23 0823   traZODone (DESYREL) tablet 50 mg  50 mg Oral QHS PRN Lenard Lance, FNP   50 mg at 05/23/23 2132   PTA Medications: Medications Prior to Admission  Medication Sig Dispense Refill Last Dose   FLUoxetine (PROZAC) 40 MG capsule Take 1 capsule (40 mg total) by mouth daily. 30 capsule 0    hydrOXYzine (ATARAX) 25 MG tablet Take 1 tablet (25 mg total) by mouth 3 (  three) times daily as needed for anxiety or vomiting (sleep, anger). 30 tablet 0    mirtazapine (REMERON) 7.5 MG tablet Take 1 tablet (7.5 mg total) by mouth at bedtime. 30 tablet 0    nicotine (NICODERM CQ - DOSED IN MG/24 HOURS) 14 mg/24hr patch Place 1 patch (14 mg total) onto the skin daily. 28 patch 0    pantoprazole (PROTONIX) 20 MG tablet Take 1 tablet (20 mg total) by mouth daily. 30 tablet 0    traZODone (DESYREL) 50 MG tablet Take 1 tablet (50 mg total) by mouth at bedtime as needed for sleep. 30 tablet 0    Musculoskeletal: Strength & Muscle Tone:  patient lying down on assessment Gait & Station:  patient lying down on assessment Patient leans:  patient lying down on assessment  Psychiatric Specialty Exam:  Presentation  General Appearance:  Appropriate for Environment; Disheveled; Other (comment) (dressed in hospital scrubs)  Eye Contact: Good  Speech: Clear and Coherent; Normal Rate  Speech Volume: Normal  Handedness: Right   Mood and Affect  Mood: Depressed; Anxious  Affect: Blunt   Thought Process  Thought Processes: Coherent; Goal Directed; Linear  Duration of Psychotic Symptoms:N/A Past Diagnosis of Schizophrenia or Psychoactive disorder: No  Descriptions of Associations:Intact  Orientation:Full (Time, Place and Person)  Thought Content:Logical  Hallucinations:Hallucinations: None  Ideas of  Reference:None  Suicidal Thoughts:Suicidal Thoughts: Yes, Active SI Active Intent and/or Plan: With Plan  Homicidal Thoughts:Homicidal Thoughts: No   Sensorium  Memory: Immediate Good  Judgment: Fair  Insight: Fair   Chartered certified accountant: Fair  Attention Span: Fair  Recall: Fair  Fund of Knowledge: Fair  Language: Fair   Psychomotor Activity  Psychomotor Activity:Psychomotor Activity: Normal   Assets  Assets: Manufacturing systems engineer; Desire for Improvement; Financial Resources/Insurance; Resilience   Sleep  Sleep:Sleep: Poor    Physical Exam: Physical Exam Constitutional:      General: He is not in acute distress.    Appearance: He is not ill-appearing, toxic-appearing or diaphoretic.  Eyes:     General: No scleral icterus. Cardiovascular:     Rate and Rhythm: Normal rate.  Pulmonary:     Effort: Pulmonary effort is normal. No respiratory distress.  Neurological:     Mental Status: He is alert and oriented to person, place, and time.  Psychiatric:        Attention and Perception: Attention and perception normal.        Mood and Affect: Mood is anxious and depressed. Affect is blunt.        Speech: Speech normal.        Behavior: Behavior normal. Behavior is cooperative.        Thought Content: Thought content is not paranoid or delusional. Thought content includes suicidal ideation. Thought content does not include homicidal ideation. Thought content includes suicidal plan.        Cognition and Memory: Cognition and memory normal.    Review of Systems  Constitutional:  Negative for chills and fever.  Respiratory:  Negative for shortness of breath.   Cardiovascular:  Negative for chest pain and palpitations.  Gastrointestinal:  Negative for abdominal pain.  Neurological:  Negative for headaches.  Psychiatric/Behavioral:  Positive for depression and suicidal ideas. The patient is nervous/anxious.    Blood pressure 104/75, pulse  73, temperature 98.3 F (36.8 C), temperature source Oral, resp. rate 18, height 5\' 8"  (1.727 m), weight 65.8 kg, SpO2 100 %. Body mass index is 22.05 kg/m.  Treatment Plan Summary:  Daily contact with patient to assess and evaluate symptoms and progress in treatment, Medication management, no medication changes at this time  Observation Level/Precautions:  15 minute checks  Laboratory:   No new labs  Psychotherapy:    Medications:    Consultations:    Discharge Concerns:    Estimated LOS:  Other:     Physician Treatment Plan for Primary Diagnosis: MDD (major depressive disorder), recurrent severe, without psychosis (HCC) Long Term Goal(s): Improvement in symptoms so as ready for discharge  Short Term Goals: Ability to verbalize feelings will improve and Ability to identify and develop effective coping behaviors will improve  Physician Treatment Plan for Secondary Diagnosis: Principal Problem:   MDD (major depressive disorder), recurrent severe, without psychosis (HCC)  Long Term Goal(s): Improvement in symptoms so as ready for discharge  Short Term Goals: Ability to verbalize feelings will improve and Ability to identify and develop effective coping behaviors will improve  I certify that inpatient services furnished can reasonably be expected to improve the patient's condition.    Lauree Chandler, NP 6/27/20242:55 PM

## 2023-05-24 NOTE — Progress Notes (Signed)
Pt is calm and cooperative, flat affect, mild anxiety, denies SI HI AVH.  Superficial scratches noted to arms.  Pt was compliant with medications.  Disheveled appearance, order initiated for dietary supplement shake per protocol. Continued monitoring for safety.  05/24/23 0600  Psych Admission Type (Psych Patients Only)  Admission Status Voluntary  Psychosocial Assessment  Patient Complaints Anxiety  Eye Contact Brief  Facial Expression Flat  Affect Flat  Speech Logical/coherent  Interaction Guarded  Motor Activity Slow  Appearance/Hygiene Disheveled  Behavior Characteristics Cooperative  Mood Anxious  Thought Process  Coherency WDL  Content WDL  Delusions None reported or observed  Perception WDL  Hallucination None reported or observed  Judgment Impaired  Confusion None  Danger to Self  Current suicidal ideation? Denies  Danger to Others  Danger to Others None reported or observed

## 2023-05-25 DIAGNOSIS — F332 Major depressive disorder, recurrent severe without psychotic features: Secondary | ICD-10-CM

## 2023-05-25 MED ORDER — OLANZAPINE 10 MG PO TABS
10.0000 mg | ORAL_TABLET | Freq: Every day | ORAL | Status: DC
  Administered 2023-05-25 – 2023-05-30 (×6): 10 mg via ORAL
  Filled 2023-05-25 (×6): qty 1

## 2023-05-25 NOTE — Progress Notes (Signed)
[facility] MD Progress Note  05/25/2023 12:17 PM Angel [name]  MRN:  [id number] Subjective: Angel [name] was seen on rounds.  He was living with his sister who kicked him out.  He does not know why.  He obviously has not been taking care of himself and has lost weight.  He admits to feeling depressed.  He is currently homeless.  He denies any suicidal ideation.  We discussed medications. Principal Problem: MDD (major depressive disorder), recurrent severe, without psychosis (HCC) Diagnosis: Principal Problem:   MDD (major depressive disorder), recurrent severe, without psychosis (HCC)  Total Time spent with patient: 15 minutes  Past Psychiatric History: Depression  Past Medical History:  Past Medical History:  Diagnosis Date   Anxiety    Depression    History of admission to inpatient psychiatry department 05/03/2023   Migraine    Suicidal ideation 05/03/2023   History reviewed. No pertinent surgical history. Family History:  Family History  Problem Relation Age of Onset   Cancer Mother        brain   Diabetes Brother    Hypertension Brother    Family Psychiatric  History: Unremarkable Social History:  Social History   Substance and Sexual Activity  Alcohol Use No     Social History   Substance and Sexual Activity  Drug Use Yes   Types: Marijuana    Social History   Socioeconomic History   Marital status: Single    Spouse name: Not on file   Number of children: Not on file   Years of education: Not on file   Highest education level: Not on file  Occupational History   Not on file  Tobacco Use   Smoking status: Every Day    Packs/day: 1.50    Years: 20.00    Additional pack years: 0.00    Total pack years: 30.00    Types: Cigarettes   Smokeless tobacco: Never  Vaping Use   Vaping Use: Never used  Substance and Sexual Activity   Alcohol use: No   Drug use: Yes    Types: Marijuana   Sexual activity: Not Currently    Birth control/protection: Condom  Other  Topics Concern   Not on file  Social History Narrative   Not on file   Social Determinants of Health   Financial Resource Strain: Not on file  Food Insecurity: Food Insecurity Present (05/23/2023)   Hunger Vital Sign    Worried About Running Out of Food in the Last Year: Often true    Ran Out of Food in the Last Year: Often true  Transportation Needs: Unmet Transportation Needs (05/23/2023)   PRAPARE - Administrator, Civil Service (Medical): No    Lack of Transportation (Non-Medical): Yes  Physical Activity: Not on file  Stress: Not on file  Social Connections: Not on file   Additional Social History:                         Sleep: Fair  Appetite:  Poor  Current Medications: Current Facility-Administered Medications  Medication Dose Route Frequency Provider Last Rate Last Admin   acetaminophen (TYLENOL) tablet 650 mg  650 mg Oral Q4H PRN Lenard Lance, FNP       diphenhydrAMINE (BENADRYL) capsule 50 mg  50 mg Oral TID PRN Lenard Lance, FNP       Or   diphenhydrAMINE (BENADRYL) injection 50 mg  50 mg Intramuscular TID PRN Lenard Lance,  FNP       feeding supplement (ENSURE ENLIVE / ENSURE PLUS) liquid 237 mL  237 mL Oral TID BM Reggie Pile, MD   237 mL at 05/24/23 2100   FLUoxetine (PROZAC) capsule 40 mg  40 mg Oral Daily Lenard Lance, FNP   40 mg at 05/25/23 0843   haloperidol (HALDOL) tablet 5 mg  5 mg Oral TID PRN Lenard Lance, FNP       Or   haloperidol lactate (HALDOL) injection 5 mg  5 mg Intramuscular TID PRN Lenard Lance, FNP       hydrOXYzine (ATARAX) tablet 25 mg  25 mg Oral TID PRN Lenard Lance, FNP   25 mg at 05/24/23 2121   LORazepam (ATIVAN) tablet 2 mg  2 mg Oral TID PRN Lenard Lance, FNP       Or   LORazepam (ATIVAN) injection 2 mg  2 mg Intramuscular TID PRN Lenard Lance, FNP       multivitamin with minerals tablet 1 tablet  1 tablet Oral Daily Reggie Pile, MD   1 tablet at 05/25/23 0843   nicotine (NICODERM CQ - dosed in mg/24  hours) patch 14 mg  14 mg Transdermal Daily Lenard Lance, FNP   14 mg at 05/25/23 0844   OLANZapine (ZYPREXA) tablet 10 mg  10 mg Oral QHS Sarina Ill, DO       pantoprazole (PROTONIX) EC tablet 40 mg  40 mg Oral Daily Lenard Lance, FNP   40 mg at 05/25/23 0843   traZODone (DESYREL) tablet 50 mg  50 mg Oral QHS PRN Lenard Lance, FNP   50 mg at 05/24/23 2122    Lab Results: No results found for this or any previous visit (from the past 48 hour(s)).  Blood Alcohol level:  Lab Results  Component Value Date   ETH <10 05/23/2023   ETH <10 04/27/2023    Metabolic Disorder Labs: Lab Results  Component Value Date   HGBA1C 5.3 04/30/2023   MPG 105 04/30/2023   MPG 105 08/28/2018   Lab Results  Component Value Date   PROLACTIN 6.7 08/28/2018   PROLACTIN 17.3 (H) 05/17/2017   Lab Results  Component Value Date   CHOL 168 04/30/2023   TRIG 108 04/30/2023   HDL 55 04/30/2023   CHOLHDL 3.1 04/30/2023   VLDL 22 04/30/2023   LDLCALC 91 04/30/2023   LDLCALC 104 (H) 08/28/2018    Physical Findings: AIMS:  , ,  ,  ,    CIWA:    COWS:     Musculoskeletal: Strength & Muscle Tone: within normal limits Gait & Station: normal Patient leans: N/A  Psychiatric Specialty Exam:  Presentation  General Appearance:  Appropriate for Environment; Disheveled; Other (comment) (dressed in hospital scrubs)  Eye Contact: Good  Speech: Clear and Coherent; Normal Rate  Speech Volume: Normal  Handedness: Right   Mood and Affect  Mood: Depressed; Anxious  Affect: Blunt   Thought Process  Thought Processes: Coherent; Goal Directed; Linear  Descriptions of Associations:Intact  Orientation:Full (Time, Place and Person)  Thought Content:Logical  History of Schizophrenia/Schizoaffective disorder:No  Duration of Psychotic Symptoms:No data recorded Hallucinations:Hallucinations: None  Ideas of Reference:None  Suicidal Thoughts:Suicidal Thoughts: Yes, Active SI  Active Intent and/or Plan: With Plan  Homicidal Thoughts:Homicidal Thoughts: No   Sensorium  Memory: Immediate Good  Judgment: Fair  Insight: Fair   Executive Functions  Concentration: Fair  Attention Span: Fair  Recall: Fair  Fund of Knowledge: Fair  Language: Fair   Psychomotor Activity  Psychomotor Activity: Psychomotor Activity: Normal   Assets  Assets: Manufacturing systems engineer; Desire for Improvement; Financial Resources/Insurance; Resilience   Sleep  Sleep: Sleep: Poor    Blood pressure 104/72, pulse 82, temperature 97.8 F (36.6 C), temperature source Oral, resp. rate 18, height 5\' 8"  (1.727 m), weight 65.8 kg, SpO2 99 %. Body mass index is 22.05 kg/m.   Treatment Plan Summary: Daily contact with patient to assess and evaluate symptoms and progress in treatment, Medication management, and Plan discontinue Remeron and start Zyprexa 10 mg at bedtime.  Continue Prozac 40 mg/day.  Sarina Ill, DO 05/25/2023, 12:17 PM

## 2023-05-25 NOTE — Plan of Care (Signed)
D- Patient alert and oriented. Patient presented in a sullen, but pleasant mood on assessment reporting that he slept "fair" last night and had no complaints to voice to this Clinical research associate. Patient endorsed hopelessness, depression, and anxiety. However, he stated "I don't want to talk about it. I just want to sleep". Patient denied SI, HI, AVH, and pain at this time, stating "not right now". Patient had no stated goals for today, however, his goal for treatment is to "find a place to live, my sister threw me out".  A- Scheduled medications administered to patient, per MD orders. Support and encouragement provided.  Routine safety checks conducted every 15 minutes.  Patient informed to notify staff with problems or concerns.  R- No adverse drug reactions noted. Patient contracts for safety at this time. Patient compliant with medications and treatment plan. Patient receptive, calm, and cooperative. Patient interacts well with others on the unit. Patient remains safe at this time.  Problem: Education: Goal: Knowledge of General Education information will improve Description: Including pain rating scale, medication(s)/side effects and non-pharmacologic comfort measures Outcome: Not Progressing   Problem: Health Behavior/Discharge Planning: Goal: Ability to manage health-related needs will improve Outcome: Not Progressing   Problem: Clinical Measurements: Goal: Ability to maintain clinical measurements within normal limits will improve Outcome: Not Progressing Goal: Will remain free from infection Outcome: Not Progressing Goal: Diagnostic test results will improve Outcome: Not Progressing Goal: Respiratory complications will improve Outcome: Not Progressing Goal: Cardiovascular complication will be avoided Outcome: Not Progressing   Problem: Activity: Goal: Risk for activity intolerance will decrease Outcome: Not Progressing   Problem: Nutrition: Goal: Adequate nutrition will be  maintained Outcome: Not Progressing   Problem: Coping: Goal: Level of anxiety will decrease Outcome: Not Progressing   Problem: Elimination: Goal: Will not experience complications related to bowel motility Outcome: Not Progressing Goal: Will not experience complications related to urinary retention Outcome: Not Progressing   Problem: Pain Managment: Goal: General experience of comfort will improve Outcome: Not Progressing   Problem: Safety: Goal: Ability to remain free from injury will improve Outcome: Not Progressing   Problem: Skin Integrity: Goal: Risk for impaired skin integrity will decrease Outcome: Not Progressing   Problem: Education: Goal: Knowledge of [facility] Education information/materials will improve Outcome: Not Progressing Goal: Emotional status will improve Outcome: Not Progressing Goal: Mental status will improve Outcome: Not Progressing Goal: Verbalization of understanding the information provided will improve Outcome: Not Progressing   Problem: Activity: Goal: Interest or engagement in activities will improve Outcome: Not Progressing Goal: Sleeping patterns will improve Outcome: Not Progressing   Problem: Coping: Goal: Ability to verbalize frustrations and anger appropriately will improve Outcome: Not Progressing Goal: Ability to demonstrate self-control will improve Outcome: Not Progressing   Problem: Health Behavior/Discharge Planning: Goal: Identification of resources available to assist in meeting health care needs will improve Outcome: Not Progressing Goal: Compliance with treatment plan for underlying cause of condition will improve Outcome: Not Progressing   Problem: Physical Regulation: Goal: Ability to maintain clinical measurements within normal limits will improve Outcome: Not Progressing   Problem: Safety: Goal: Periods of time without injury will increase Outcome: Not Progressing   Problem: Education: Goal:  Ability to make informed decisions regarding treatment will improve Outcome: Not Progressing   Problem: Coping: Goal: Coping ability will improve Outcome: Not Progressing   Problem: Health Behavior/Discharge Planning: Goal: Identification of resources available to assist in meeting health care needs will improve Outcome: Not Progressing   Problem: Medication: Goal: Compliance  with prescribed medication regimen will improve Outcome: Not Progressing   Problem: Self-Concept: Goal: Ability to disclose and discuss suicidal ideas will improve Outcome: Not Progressing Goal: Will verbalize positive feelings about self Outcome: Not Progressing   Problem: Education: Goal: Utilization of techniques to improve thought processes will improve Outcome: Not Progressing Goal: Knowledge of the prescribed therapeutic regimen will improve Outcome: Not Progressing   Problem: Activity: Goal: Interest or engagement in leisure activities will improve Outcome: Not Progressing Goal: Imbalance in normal sleep/wake cycle will improve Outcome: Not Progressing   Problem: Coping: Goal: Coping ability will improve Outcome: Not Progressing Goal: Will verbalize feelings Outcome: Not Progressing   Problem: Health Behavior/Discharge Planning: Goal: Ability to make decisions will improve Outcome: Not Progressing Goal: Compliance with therapeutic regimen will improve Outcome: Not Progressing   Problem: Role Relationship: Goal: Will demonstrate positive changes in social behaviors and relationships Outcome: Not Progressing   Problem: Safety: Goal: Ability to disclose and discuss suicidal ideas will improve Outcome: Not Progressing Goal: Ability to identify and utilize support systems that promote safety will improve Outcome: Not Progressing   Problem: Self-Concept: Goal: Will verbalize positive feelings about self Outcome: Not Progressing Goal: Level of anxiety will decrease Outcome: Not  Progressing

## 2023-05-25 NOTE — Progress Notes (Signed)
   05/24/23 1954  Psych Admission Type (Psych Patients Only)  Admission Status Voluntary  Psychosocial Assessment  Patient Complaints Anxiety;Depression  Eye Contact Brief  Facial Expression Flat  Affect Flat  Speech Logical/coherent  Interaction Guarded  Motor Activity Slow  Appearance/Hygiene Disheveled  Behavior Characteristics Cooperative  Mood Anxious  Thought Process  Coherency WDL  Content WDL  Delusions None reported or observed  Perception WDL  Hallucination None reported or observed  Judgment Impaired  Confusion None  Danger to Self  Current suicidal ideation? Denies (DENIES)  Agreement Not to Harm Self Yes  Description of Agreement VERBAL  Danger to Others  Danger to Others None reported or observed

## 2023-05-25 NOTE — Group Note (Signed)
Date:  05/25/2023 Time:  10:00 AM  Group Topic/Focus:  Goals Group:   The focus of this group is to help patients establish daily goals to achieve during treatment and discuss how the patient can incorporate goal setting into their daily lives to aide in recovery.    Participation Level:  Did Not Attend   Angel [name] Angel [name] 05/25/2023, 10:00 AM  

## 2023-05-25 NOTE — B[facility] Counselor (Signed)
Adult Comprehensive Assessment  Patient ID: Angel [name], male   DOB: 25-Dec-1980, 42 y.o.   MRN: [id number]  Information Source: Information source: Patient (Previous PSA from 04/28/23 encounter.)  Current Stressors:  Patient states their primary concerns and needs for treatment are:: "What my sister did to me." He shares that she dropped him off at [place] with his dog and stuff and left him there. Patient states their goals for this hospitilization and ongoing recovery are:: "I don't know. Find a place to live." Educational / Learning stressors: Pt denies Employment / Job issues: "I can work but I just don't." Family Relationships: "They don't help me or talk to me." Financial / Lack of resources (include bankruptcy): No income Housing / Lack of housing: "The place I was staying at, I got kicked out. The owner sold it." Pt shares that his sister dropped him off at the store on Monday. Physical health (include injuries & life threatening diseases): Pt denies Social relationships: "I am to myself." Substance abuse: Pt denies Bereavement / Loss: Loss of housing, "My younger brother hung himself a few years ago."  Living/Environment/Situation:  Living Arrangements: Alone Living conditions (as described by patient or guardian): Currently homeless Who else lives in the home?: Previously was staying with sister and her daughter for approximately a week and a half prior to her dropping him off at the store. How long has patient lived in current situation?: "A few days." What is atmosphere in current home: Chaotic, Temporary  Family History:  Marital status: Single Are you sexually active?: No What is your sexual orientation?: Straight Has your sexual activity been affected by drugs, alcohol, medication, or emotional stress?: N/A Does patient have children?: No  Childhood History:  By whom was/is the patient raised?: Both parents Additional childhood history information: Patient reports  having a poor and rough childhood Description of patient's relationship with caregiver when they were a child: Mother: good; Father: "He's no good." Patient's description of current relationship with people who raised him/her: Mother is deceased and father is in a nursing home. Pt denies having anything to do with him. How were you disciplined when you got in trouble as a child/adolescent?: WNL Does patient have siblings?: Yes Number of Siblings: 3 (An older brother, and a younger brother and sister.) Description of patient's current relationship with siblings: Younger brother hung himself a few years ago. Pt shares that his older brother has health issues. Did patient suffer any verbal/emotional/physical/sexual abuse as a child?: No Did patient suffer from severe childhood neglect?: No Has patient ever been sexually abused/assaulted/raped as an adolescent or adult?: No Was the patient ever a victim of a crime or a disaster?: No Witnessed domestic violence?: No Has patient been affected by domestic violence as an adult?: No  Education:  Highest grade of school patient has completed: tenth grade Currently a student?: No Learning disability?: Yes What learning problems does patient have?: Pt shares that he took special classes in grade school "for reading and stuff." However, he shares that by high school he was caught up.  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?: No  Financial Resources:   Financial resources: Medicaid Does patient have a Lawyer or guardian?: No  Alcohol/Substance Abuse:   What has been your use of  drugs/alcohol within the last 12 months?: Pt denis any use of substances If attempted suicide, did drugs/alcohol play a role in  this?: No Alcohol/Substance Abuse Treatment Hx: Denies past history If yes, describe treatment: N/A Has alcohol/substance abuse ever caused legal problems?: No  Social Support System:   Patient's Community Support System: None Describe Community Support System: He reports that he used to attend [facility]. Type of faith/religion: "No" How does patient's faith help to cope with current illness?: "I don't have a religion."  Leisure/Recreation:   Do You Have Hobbies?: Yes Leisure and Hobbies: "Playing video games and drawing."  Strengths/Needs:   What is the patient's perception of their strengths?: "Pretty good drawer, I like to read." Patient states they can use these personal strengths during their treatment to contribute to their recovery: "None" Patient states these barriers may affect/interfere with their treatment: Pt denies Patient states these barriers may affect their return to the community: He notes housing as a barrier. Other important information patient would like considered in planning for their treatment: N/A  Discharge Plan:   Currently receiving community mental health services: No Patient states concerns and preferences for aftercare planning are: Pt wants referral back to [facility]. Patient states they will know when they are safe and ready for discharge when: "I really don't know. I just want to make sure I'm right before I leave." Does patient have access to transportation?: No Does patient have financial barriers related to discharge medications?: No Plan for living situation after discharge: Homeless shelter resources given. [facility]. Will patient be returning to same living situation after discharge?: No  Summary/Recommendations:   Summary and Recommendations (to be completed by the evaluator): Pt is a 42 year old, male from [place], [place] [facility] He shared that he came to the hospital because his sister dropped him  off at [place] with his stuff and his dog and left him there. Pt explained that he had been living with her prior to this. He expressed that he wants to work on finding somewhere to live while here. CSW explained limitations around housing options from the hospital and inability to get pt a private housing situation. He voiced understanding. 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 However, he did express that if he gets too stressed out and thinks about cutting while on the unit that he would let staff know. Lack of housing and finances noted as stressors. He is unemployed and not seeking employment. Pt was previously receiving mental health services through [facility] and would like to have follow up scheduled with them post discharge. He was given Pensions consultant but is looking for somewhere to go near the [facility] area. CSW informed pt that he would need to do some research around this. Pt agreed. Recommendations include: crisis stabilization, therapeutic milieu, encourage group attendance and participation, medication management for detox/mood stabilization, and development of a comprehensive mental wellness/sobriety plan.  Glenis Smoker. 05/25/2023

## 2023-05-25 NOTE — BH IP Treatment Plan (Signed)
Interdisciplinary Treatment and Diagnostic Plan Update  05/25/2023 Time of Session: 9:51 AM  Angel [name] MRN: [id number]  Principal Diagnosis: MDD (major depressive disorder), recurrent severe, without psychosis (HCC)  Secondary Diagnoses: Principal Problem:   MDD (major depressive disorder), recurrent severe, without psychosis (HCC)   Current Medications:  Current Facility-Administered Medications  Medication Dose Route Frequency Provider Last Rate Last Admin   acetaminophen (TYLENOL) tablet 650 mg  650 mg Oral Q4H PRN Lenard Lance, FNP       diphenhydrAMINE (BENADRYL) capsule 50 mg  50 mg Oral TID PRN Lenard Lance, FNP       Or   diphenhydrAMINE (BENADRYL) injection 50 mg  50 mg Intramuscular TID PRN Lenard Lance, FNP       feeding supplement (ENSURE ENLIVE / ENSURE PLUS) liquid 237 mL  237 mL Oral TID BM Reggie Pile, MD   237 mL at 05/24/23 2100   FLUoxetine (PROZAC) capsule 40 mg  40 mg Oral Daily Lenard Lance, FNP   40 mg at 05/25/23 0843   haloperidol (HALDOL) tablet 5 mg  5 mg Oral TID PRN Lenard Lance, FNP       Or   haloperidol lactate (HALDOL) injection 5 mg  5 mg Intramuscular TID PRN Lenard Lance, FNP       hydrOXYzine (ATARAX) tablet 25 mg  25 mg Oral TID PRN Lenard Lance, FNP   25 mg at 05/24/23 2121   LORazepam (ATIVAN) tablet 2 mg  2 mg Oral TID PRN Lenard Lance, FNP       Or   LORazepam (ATIVAN) injection 2 mg  2 mg Intramuscular TID PRN Lenard Lance, FNP       mirtazapine (REMERON) tablet 7.5 mg  7.5 mg Oral QHS Lenard Lance, FNP   7.5 mg at 05/24/23 2121   multivitamin with minerals tablet 1 tablet  1 tablet Oral Daily Reggie Pile, MD   1 tablet at 05/25/23 0843   nicotine (NICODERM CQ - dosed in mg/24 hours) patch 14 mg  14 mg Transdermal Daily Lenard Lance, FNP   14 mg at 05/25/23 0844   pantoprazole (PROTONIX) EC tablet 40 mg  40 mg Oral Daily Lenard Lance, FNP   40 mg at 05/25/23 0843   traZODone (DESYREL) tablet 50 mg  50 mg Oral QHS PRN Lenard Lance, FNP   50 mg at 05/24/23 2122   PTA Medications: Medications Prior to Admission  Medication Sig Dispense Refill Last Dose   FLUoxetine (PROZAC) 40 MG capsule Take 1 capsule (40 mg total) by mouth daily. 30 capsule 0    hydrOXYzine (ATARAX) 25 MG tablet Take 1 tablet (25 mg total) by mouth 3 (three) times daily as needed for anxiety or vomiting (sleep, anger). 30 tablet 0    mirtazapine (REMERON) 7.5 MG tablet Take 1 tablet (7.5 mg total) by mouth at bedtime. 30 tablet 0    nicotine (NICODERM CQ - DOSED IN MG/24 HOURS) 14 mg/24hr patch Place 1 patch (14 mg total) onto the skin daily. 28 patch 0    pantoprazole (PROTONIX) 20 MG tablet Take 1 tablet (20 mg total) by mouth daily. 30 tablet 0    traZODone (DESYREL) 50 MG tablet Take 1 tablet (50 mg total) by mouth at bedtime as needed for sleep. 30 tablet 0     Patient Stressors: Financial difficulties   Loss of relationships   Marital or family conflict  Medication change or noncompliance   Occupational concerns   Traumatic event    Patient Strengths: Ability for insight  Communication skills  General fund of knowledge   Treatment Modalities: Medication Management, Group therapy, Case management,  1 to 1 session with clinician, Psychoeducation, Recreational therapy.   Physician Treatment Plan for Primary Diagnosis: MDD (major depressive disorder), recurrent severe, without psychosis (HCC) Long Term Goal(s): Improvement in symptoms so as ready for discharge   Short Term Goals: Ability to verbalize feelings will improve Ability to identify and develop effective coping behaviors will improve  Medication Management: Evaluate patient's response, side effects, and tolerance of medication regimen.  Therapeutic Interventions: 1 to 1 sessions, Unit Group sessions and Medication administration.  Evaluation of Outcomes: Not Met  Physician Treatment Plan for Secondary Diagnosis: Principal Problem:   MDD (major depressive disorder),  recurrent severe, without psychosis (HCC)  Long Term Goal(s): Improvement in symptoms so as ready for discharge   Short Term Goals: Ability to verbalize feelings will improve Ability to identify and develop effective coping behaviors will improve     Medication Management: Evaluate patient's response, side effects, and tolerance of medication regimen.  Therapeutic Interventions: 1 to 1 sessions, Unit Group sessions and Medication administration.  Evaluation of Outcomes: Not Met   RN Treatment Plan for Primary Diagnosis: MDD (major depressive disorder), recurrent severe, without psychosis (HCC) Long Term Goal(s): Knowledge of disease and therapeutic regimen to maintain health will improve  Short Term Goals: Ability to remain free from injury will improve, Ability to verbalize frustration and anger appropriately will improve, Ability to demonstrate self-control, Ability to participate in decision making will improve, Ability to verbalize feelings will improve, Ability to disclose and discuss suicidal ideas, Ability to identify and develop effective coping behaviors will improve, and Compliance with prescribed medications will improve  Medication Management: RN will administer medications as ordered by provider, will assess and evaluate patient's response and provide education to patient for prescribed medication. RN will report any adverse and/or side effects to prescribing provider.  Therapeutic Interventions: 1 on 1 counseling sessions, Psychoeducation, Medication administration, Evaluate responses to treatment, Monitor vital signs and CBGs as ordered, Perform/monitor CIWA, COWS, AIMS and Fall Risk screenings as ordered, Perform wound care treatments as ordered.  Evaluation of Outcomes: Not Met   LCSW Treatment Plan for Primary Diagnosis: MDD (major depressive disorder), recurrent severe, without psychosis (HCC) Long Term Goal(s): Safe transition to appropriate next level of care at  discharge, Engage patient in therapeutic group addressing interpersonal concerns.  Short Term Goals: Engage patient in aftercare planning with referrals and resources, Increase social support, Increase ability to appropriately verbalize feelings, Increase emotional regulation, Facilitate acceptance of mental health diagnosis and concerns, Facilitate patient progression through stages of change regarding substance use diagnoses and concerns, and Increase skills for wellness and recovery  Therapeutic Interventions: Assess for all discharge needs, 1 to 1 time with Social worker, Explore available resources and support systems, Assess for adequacy in community support network, Educate family and significant other(s) on suicide prevention, Complete Psychosocial Assessment, Interpersonal group therapy.  Evaluation of Outcomes: Not Met   Progress in Treatment: Attending groups: No. Participating in groups: No. Taking medication as prescribed: Yes. Toleration medication: Yes. Family/Significant other contact made: No, will contact:  CSW will contact if given permission  Patient understands diagnosis: Yes. Discussing patient identified problems/goals with staff: Yes. Medical problems stabilized or resolved: Yes. Denies suicidal/homicidal ideation: No. Issues/concerns per patient self-inventory: No. Other: None  New problem(s) identified: No,  Describe:  None Identified   New Short Term/Long Term Goal(s):elimination of symptoms of psychosis, medication management for mood stabilization; elimination of SI thoughts; development of comprehensive mental wellness plan.   Patient Goals:  "I don't know, find a place to live"   Discharge Plan or Barriers: CSW will assist pt with appropriate discharge plan"   Reason for Continuation of Hospitalization: Depression Suicidal ideation  Estimated Length of Stay: 1 to 7 days   Last 3 [place] Suicide Severity Risk Score: Flowsheet Row Admission (Current)  from 05/23/2023 in [facility] Most recent reading at 05/23/2023  6:00 PM ED from 05/23/2023 in [facility] Emergency Department at [facility] Most recent reading at 05/23/2023  8:45 AM [facility] Most recent reading at 04/28/2023  2:55 AM  C-SSRS RISK CATEGORY High Risk High Risk High Risk       Last PHQ 2/9 Scores:     No data to display          Scribe for Treatment Team: Elza Rafter, Theresia Majors 05/25/2023 11:25 AM

## 2023-05-25 NOTE — Group Note (Signed)
Recreation Therapy Group Note   Group Topic:Leisure Education  Group Date: 05/25/2023 Start Time: 1000 End Time: 1105 Facilitators: Rosina Lowenstein, LRT, CTRS Location:  Day Room  Group Description: Leisure. Patients were given the option to choose from coloring, singing karaoke, or playing cards. LRT and pts discussed the meaning of leisure, the importance of participating in leisure during their free time/when they're outside of the hospital, as well as how our leisure interests can also serve as coping skills. Pt identified two leisure interests and shared with the group.   Goal Area(s) Addressed:  Patient will identify a current leisure interest.  Patient will learn the definition of "leisure". Patient will practice making a positive decision. Patient will have the opportunity to try a new leisure activity. Patient will communicate with peers and LRT.   Affect/Mood: Appropriate and Flat   Participation Level: Active and Engaged   Participation Quality: Independent   Behavior: Calm and Cooperative   Speech/Thought Process: Coherent   Insight: Good   Judgement: Good   Modes of Intervention: Activity   Patient Response to Interventions:  Attentive, Engaged, Interested , and Receptive   Education Outcome:  Acknowledges education   Clinical Observations/Individualized Feedback: Angel [name] was active in their participation of session activities and group discussion. Pt identified "play mindcraft and walking" as things he does in his free time. Pt chose to color with oil pastels and make origami while in group. Pt interacted well with LRT and peers duration of session.   Plan: Continue to engage patient in RT group sessions 2-3x/week.   Rosina Lowenstein, LRT, CTRS 05/25/2023 11:36 AM

## 2023-05-26 DIAGNOSIS — F332 Major depressive disorder, recurrent severe without psychotic features: Secondary | ICD-10-CM | POA: Diagnosis not present

## 2023-05-26 NOTE — Group Note (Signed)
Date:  05/26/2023 Time:  6:38 PM  Group Topic/Focus:  Self Care:   The focus of this group is to help patients understand the importance of self-care in order to improve or restore emotional, physical, spiritual, interpersonal, and financial health. Gratitude assertiveness.     Participation Level:  Did Not Attend   Angel [name] 05/26/2023, 6:38 PM

## 2023-05-26 NOTE — Progress Notes (Signed)
The patient accepted his medications as ordered, watched television, and listened to music.  Mood was stable and no unusual behaviors noted.  The patient fell asleep before 2200.

## 2023-05-26 NOTE — Progress Notes (Signed)
The patient was visible in the milieu, cooperative, and behaviorally appropriate.  He accepted his medications as ordered and continues to endorse vague suicidal ideation.  Angel [name] stated that he would remain free from self-harm while in the BMU.  Symptoms of depression and anxiety endorsed.  Intermittent response to internal stimuli likely.  Attention to ADL's are in need of improvement.

## 2023-05-26 NOTE — Group Note (Signed)
Date:  05/26/2023 Time:  10:27 PM  Group Topic/Focus:  Wrap-Up Group:   The focus of this group is to help patients review their daily goal of treatment and discuss progress on daily workbooks.    Participation Level:  Minimal  Participation Quality:  Appropriate  Affect:  Appropriate  Cognitive:  Alert  Insight: Lacking and Limited  Engagement in Group:  Limited  Modes of Intervention:  Activity  Additional Comments:     Maglione,Jaevon Paras E 05/26/2023, 10:27 PM

## 2023-05-26 NOTE — Group Note (Signed)
LCSW Group Therapy Note  Group Date: 05/26/2023 Start Time: 1315 End Time: 1355   Type of Therapy and Topic:  Group Therapy - Healthy vs Unhealthy Coping Skills  Participation Level:  None   Description of Group The focus of this group was to determine what unhealthy coping techniques typically are used by group members and what healthy coping techniques would be helpful in coping with various problems. Patients were guided in becoming aware of the differences between healthy and unhealthy coping techniques. Patients were asked to identify 2-3 healthy coping skills they would like to learn to use more effectively.  Therapeutic Goals Patients learned that coping is what human beings do all day long to deal with various situations in their lives Patients defined and discussed healthy vs unhealthy coping techniques Patients identified their preferred coping techniques and identified whether these were healthy or unhealthy Patients determined 2-3 healthy coping skills they would like to become more familiar with and use more often. Patients provided support and ideas to each other   Summary of Patient Progress: The patient attended group. The patient answered the icebreaker question but did not participate during group.    Marshell Levan, LCSWA 05/26/2023  2:16 PM

## 2023-05-26 NOTE — Progress Notes (Signed)
[facility] MD Progress Note  05/26/2023 2:21 PM Angel [name]  MRN:  [id number] Subjective: Angel [name] was seen on rounds.  He states he slept a little bit better last night.  He still depressed.  Started him on Zyprexa last night.  He remains on Prozac.  He is currently homeless. Principal Problem: MDD (major depressive disorder), recurrent severe, without psychosis (HCC) Diagnosis: Principal Problem:   MDD (major depressive disorder), recurrent severe, without psychosis (HCC)  Total Time spent with patient: 15 minutes  Past Psychiatric History: Depression  Past Medical History:  Past Medical History:  Diagnosis Date   Anxiety    Depression    History of admission to inpatient psychiatry department 05/03/2023   Migraine    Suicidal ideation 05/03/2023   History reviewed. No pertinent surgical history. Family History:  Family History  Problem Relation Age of Onset   Cancer Mother        brain   Diabetes Brother    Hypertension Brother    Family Psychiatric  History: Unremarkable Social History:  Social History   Substance and Sexual Activity  Alcohol Use No     Social History   Substance and Sexual Activity  Drug Use Yes   Types: Marijuana    Social History   Socioeconomic History   Marital status: Single    Spouse name: Not on file   Number of children: Not on file   Years of education: Not on file   Highest education level: Not on file  Occupational History   Not on file  Tobacco Use   Smoking status: Every Day    Packs/day: 1.50    Years: 20.00    Additional pack years: 0.00    Total pack years: 30.00    Types: Cigarettes   Smokeless tobacco: Never  Vaping Use   Vaping Use: Never used  Substance and Sexual Activity   Alcohol use: No   Drug use: Yes    Types: Marijuana   Sexual activity: Not Currently    Birth control/protection: Condom  Other Topics Concern   Not on file  Social History Narrative   Not on file   Social Determinants of Health    Financial Resource Strain: Not on file  Food Insecurity: Food Insecurity Present (05/23/2023)   Hunger Vital Sign    Worried About Running Out of Food in the Last Year: Often true    Ran Out of Food in the Last Year: Often true  Transportation Needs: Unmet Transportation Needs (05/23/2023)   PRAPARE - Administrator, Civil Service (Medical): No    Lack of Transportation (Non-Medical): Yes  Physical Activity: Not on file  Stress: Not on file  Social Connections: Not on file   Additional Social History:                         Sleep: Good  Appetite:  Good  Current Medications: Current Facility-Administered Medications  Medication Dose Route Frequency Provider Last Rate Last Admin   acetaminophen (TYLENOL) tablet 650 mg  650 mg Oral Q4H PRN Lenard Lance, FNP       diphenhydrAMINE (BENADRYL) capsule 50 mg  50 mg Oral TID PRN Lenard Lance, FNP       Or   diphenhydrAMINE (BENADRYL) injection 50 mg  50 mg Intramuscular TID PRN Lenard Lance, FNP       feeding supplement (ENSURE ENLIVE / ENSURE PLUS) liquid 237 mL  237 mL  Oral TID BM Reggie Pile, MD   237 mL at 05/26/23 0912   FLUoxetine (PROZAC) capsule 40 mg  40 mg Oral Daily Lenard Lance, FNP   40 mg at 05/26/23 0913   haloperidol (HALDOL) tablet 5 mg  5 mg Oral TID PRN Lenard Lance, FNP       Or   haloperidol lactate (HALDOL) injection 5 mg  5 mg Intramuscular TID PRN Lenard Lance, FNP       hydrOXYzine (ATARAX) tablet 25 mg  25 mg Oral TID PRN Lenard Lance, FNP   25 mg at 05/25/23 1819   LORazepam (ATIVAN) tablet 2 mg  2 mg Oral TID PRN Lenard Lance, FNP       Or   LORazepam (ATIVAN) injection 2 mg  2 mg Intramuscular TID PRN Lenard Lance, FNP       multivitamin with minerals tablet 1 tablet  1 tablet Oral Daily Reggie Pile, MD   1 tablet at 05/26/23 0912   nicotine (NICODERM CQ - dosed in mg/24 hours) patch 14 mg  14 mg Transdermal Daily Lenard Lance, FNP   14 mg at 05/26/23 0913   OLANZapine  (ZYPREXA) tablet 10 mg  10 mg Oral QHS Sarina Ill, DO   10 mg at 05/25/23 2115   pantoprazole (PROTONIX) EC tablet 40 mg  40 mg Oral Daily Lenard Lance, FNP   40 mg at 05/26/23 0912   traZODone (DESYREL) tablet 50 mg  50 mg Oral QHS PRN Lenard Lance, FNP   50 mg at 05/25/23 2115    Lab Results: No results found for this or any previous visit (from the past 48 hour(s)).  Blood Alcohol level:  Lab Results  Component Value Date   ETH <10 05/23/2023   ETH <10 04/27/2023    Metabolic Disorder Labs: Lab Results  Component Value Date   HGBA1C 5.3 04/30/2023   MPG 105 04/30/2023   MPG 105 08/28/2018   Lab Results  Component Value Date   PROLACTIN 6.7 08/28/2018   PROLACTIN 17.3 (H) 05/17/2017   Lab Results  Component Value Date   CHOL 168 04/30/2023   TRIG 108 04/30/2023   HDL 55 04/30/2023   CHOLHDL 3.1 04/30/2023   VLDL 22 04/30/2023   LDLCALC 91 04/30/2023   LDLCALC 104 (H) 08/28/2018    Physical Findings: AIMS:  , ,  ,  ,    CIWA:    COWS:     Musculoskeletal: Strength & Muscle Tone: within normal limits Gait & Station: normal Patient leans: N/A  Psychiatric Specialty Exam:  Presentation  General Appearance:  Appropriate for Environment; Disheveled; Other (comment) (dressed in hospital scrubs)  Eye Contact: Good  Speech: Clear and Coherent; Normal Rate  Speech Volume: Normal  Handedness: Right   Mood and Affect  Mood: Depressed; Anxious  Affect: Blunt   Thought Process  Thought Processes: Coherent; Goal Directed; Linear  Descriptions of Associations:Intact  Orientation:Full (Time, Place and Person)  Thought Content:Logical  History of Schizophrenia/Schizoaffective disorder:No  Duration of Psychotic Symptoms:No data recorded Hallucinations:No data recorded Ideas of Reference:None  Suicidal Thoughts:No data recorded Homicidal Thoughts:No data recorded  Sensorium  Memory: Immediate  Good  Judgment: Fair  Insight: Fair   Art therapist  Concentration: Fair  Attention Span: Fair  Recall: [employer] of Knowledge: Fair  Language: Fair   Psychomotor Activity  Psychomotor Activity:No data recorded  Assets  Assets: Communication Skills; Desire for Improvement; Financial  Resources/Insurance; Resilience   Sleep  Sleep:No data recorded    Blood pressure 101/71, pulse 71, temperature 97.9 F (36.6 C), temperature source Oral, resp. rate (!) 21, height 5\' 8"  (1.727 m), weight 65.8 kg, SpO2 98 %. Body mass index is 22.05 kg/m.   Treatment Plan Summary: Daily contact with patient to assess and evaluate symptoms and progress in treatment, Medication management, and Plan continue current medications.  Sarina Ill, DO 05/26/2023, 2:21 PM

## 2023-05-26 NOTE — Progress Notes (Signed)
D- Patient alert and oriented x 3-4. Affect flat/mood depressed. Denies SI/ HI/ AVH. Patient denies pain. Patient endorses depression, hopelessness and anxiety. PRN Hydroxyzine administered for complaints of anxiety with fair results. A- Scheduled and PRN medications administered to patient, per MD orders. Support and encouragement provided.  Routine safety checks conducted every 15 minutes without incident.  Patient informed to notify staff with problems or concerns and verbalizes understanding. R- No adverse drug reactions noted.  Patient compliant with medications and treatment plan. He is isolative to his room except for meals.  Patient contracts for safety and  remains safe on the unit at this time.

## 2023-05-26 NOTE — [facility] Suicide Risk Assessment (Signed)
[facility] INPATIENT:  Family/Significant Other Suicide Prevention Education  Suicide Prevention Education:  Education Completed; brother, Bari Doorley, [phone] ,  has been identified by the patient as the family member/significant other with whom the patient will be residing, and identified as the person(s) who will aid the patient in the event of a mental health crisis (suicidal ideations/suicide attempt).  With written consent from the patient, the family member/significant other has been provided the following suicide prevention education, prior to the and/or following the discharge of the patient.  The suicide prevention education provided includes the following: Suicide risk factors Suicide prevention and interventions National Suicide Hotline telephone number [facility] assessment telephone number [facility] Emergency Assistance 911 [facility] and/or Residential Mobile Crisis Unit telephone number  Request made of family/significant other to: Remove weapons (e.g., guns, rifles, knives), all items previously/currently identified as safety concern.   Remove drugs/medications (over-the-counter, prescriptions, illicit drugs), all items previously/currently identified as a safety concern.  The family member/significant other verbalizes understanding of the suicide prevention education information provided.  The family member/significant other agrees to remove the items of safety concern listed above.  The LCSWA contacted the patients brother to provide suicide prevention education. The patients brother stated that he didn't have any concerns about the patient being discharged just wanted to make sure he was okay. Stated that the patient was talking about suicide when they spoke on the phone. The brother stated that the patient mention hanging himself. The brother stated that he had not seen the patient in a while, so the LCSWA suggested that the brother check in on the  patient and informed him of the importance of the patient having a support system upon discharge. The brother stated that the patient may not have anywhere to go and would probably need placement assistance.    Marshell Levan 05/26/2023, 3:28 PM

## 2023-05-26 NOTE — Group Note (Signed)
Date:  05/26/2023 Time:  10:50 AM  Group Topic/Focus:  Music Therapy- Outside group time, working and building team skills by playing basketball  and other group activities.    Participation Level:  Minimal  Participation Quality:  Appropriate  Affect:  Appropriate  Cognitive:  Alert  Insight: Appropriate  Engagement in Group:  Engaged  Modes of Intervention:  Activity  Additional Comments:    Doug Sou 05/26/2023, 10:50 AM

## 2023-05-27 DIAGNOSIS — F332 Major depressive disorder, recurrent severe without psychotic features: Secondary | ICD-10-CM | POA: Diagnosis not present

## 2023-05-27 NOTE — Plan of Care (Signed)

## 2023-05-27 NOTE — Group Note (Signed)
Date:  05/27/2023 Time:  3:48 PM  Group Topic/Focus:  Activity Group:  The purpose of this group is to encourage patients to come outside to the courtyard to get some fresh air for the support of their physical and mental wellbeing.    Participation Level:  Did Not Attend   Angel [name] 05/27/2023, 3:48 PM

## 2023-05-27 NOTE — Group Note (Signed)
Date:  05/27/2023 Time:  10:07 AM  Group Topic/Focus:  Activity Group:  The focus of this group is to encourage patients to come outside to the courtyard to assist with their physical and mental wellbeing.    Participation Level:  Did Not Attend   Mary Sella Ayanna Gheen 05/27/2023, 10:07 AM

## 2023-05-27 NOTE — Progress Notes (Signed)
   05/27/23 0810  Psych Admission Type (Psych Patients Only)  Admission Status Voluntary  Psychosocial Assessment  Patient Complaints Anxiety  Eye Contact Fair  Facial Expression Animated  Affect Appropriate to circumstance  Speech Soft  Interaction Minimal  Motor Activity Slow  Appearance/Hygiene Disheveled  Behavior Characteristics Appropriate to situation  Mood Pleasant  Thought Process  Coherency WDL  Content WDL  Delusions None reported or observed  Perception WDL  Hallucination None reported or observed  Judgment Impaired  Confusion None  Danger to Self  Current suicidal ideation? Denies  Agreement Not to Harm Self Yes  Description of Agreement verbal  Danger to Others  Danger to Others None reported or observed   Pt is frequently pacing in hall way. No signs of distress or injury. Pt is minimal during assessment.

## 2023-05-27 NOTE — Progress Notes (Signed)
[facility] MD Progress Note  05/27/2023 2:10 PM Angel [name]  MRN:  [id number] Subjective: Angel [name] was seen on rounds.  He states that he slept better last night.  He is doing better on Zyprexa.  He states his mood is a little bit better.  Nurses report no issues. Principal Problem: MDD (major depressive disorder), recurrent severe, without psychosis (HCC) Diagnosis: Principal Problem:   MDD (major depressive disorder), recurrent severe, without psychosis (HCC)  Total Time spent with patient: 15 minutes  Past Psychiatric History: Depression  Past Medical History:  Past Medical History:  Diagnosis Date   Anxiety    Depression    History of admission to inpatient psychiatry department 05/03/2023   Migraine    Suicidal ideation 05/03/2023   History reviewed. No pertinent surgical history. Family History:  Family History  Problem Relation Age of Onset   Cancer Mother        brain   Diabetes Brother    Hypertension Brother    Family Psychiatric  History: Unremarkable Social History:  Social History   Substance and Sexual Activity  Alcohol Use No     Social History   Substance and Sexual Activity  Drug Use Yes   Types: Marijuana    Social History   Socioeconomic History   Marital status: Single    Spouse name: Not on file   Number of children: Not on file   Years of education: Not on file   Highest education level: Not on file  Occupational History   Not on file  Tobacco Use   Smoking status: Every Day    Packs/day: 1.50    Years: 20.00    Additional pack years: 0.00    Total pack years: 30.00    Types: Cigarettes   Smokeless tobacco: Never  Vaping Use   Vaping Use: Never used  Substance and Sexual Activity   Alcohol use: No   Drug use: Yes    Types: Marijuana   Sexual activity: Not Currently    Birth control/protection: Condom  Other Topics Concern   Not on file  Social History Narrative   Not on file   Social Determinants of Health   Financial  Resource Strain: Not on file  Food Insecurity: Food Insecurity Present (05/23/2023)   Hunger Vital Sign    Worried About Running Out of Food in the Last Year: Often true    Ran Out of Food in the Last Year: Often true  Transportation Needs: Unmet Transportation Needs (05/23/2023)   PRAPARE - Administrator, Civil Service (Medical): No    Lack of Transportation (Non-Medical): Yes  Physical Activity: Not on file  Stress: Not on file  Social Connections: Not on file   Additional Social History:                         Sleep: Good  Appetite:  Fair  Current Medications: Current Facility-Administered Medications  Medication Dose Route Frequency Provider Last Rate Last Admin   acetaminophen (TYLENOL) tablet 650 mg  650 mg Oral Q4H PRN Lenard Lance, FNP       diphenhydrAMINE (BENADRYL) capsule 50 mg  50 mg Oral TID PRN Lenard Lance, FNP       Or   diphenhydrAMINE (BENADRYL) injection 50 mg  50 mg Intramuscular TID PRN Lenard Lance, FNP       feeding supplement (ENSURE ENLIVE / ENSURE PLUS) liquid 237 mL  237 mL Oral  TID BM Reggie Pile, MD   237 mL at 05/27/23 1303   FLUoxetine (PROZAC) capsule 40 mg  40 mg Oral Daily Lenard Lance, FNP   40 mg at 05/27/23 1610   haloperidol (HALDOL) tablet 5 mg  5 mg Oral TID PRN Lenard Lance, FNP       Or   haloperidol lactate (HALDOL) injection 5 mg  5 mg Intramuscular TID PRN Lenard Lance, FNP       hydrOXYzine (ATARAX) tablet 25 mg  25 mg Oral TID PRN Lenard Lance, FNP   25 mg at 05/27/23 1304   LORazepam (ATIVAN) tablet 2 mg  2 mg Oral TID PRN Lenard Lance, FNP       Or   LORazepam (ATIVAN) injection 2 mg  2 mg Intramuscular TID PRN Lenard Lance, FNP       multivitamin with minerals tablet 1 tablet  1 tablet Oral Daily Reggie Pile, MD   1 tablet at 05/27/23 9604   nicotine (NICODERM CQ - dosed in mg/24 hours) patch 14 mg  14 mg Transdermal Daily Lenard Lance, FNP   14 mg at 05/27/23 0812   OLANZapine (ZYPREXA) tablet  10 mg  10 mg Oral QHS Sarina Ill, DO   10 mg at 05/26/23 2117   pantoprazole (PROTONIX) EC tablet 40 mg  40 mg Oral Daily Lenard Lance, FNP   40 mg at 05/27/23 5409   traZODone (DESYREL) tablet 50 mg  50 mg Oral QHS PRN Lenard Lance, FNP   50 mg at 05/26/23 2117    Lab Results: No results found for this or any previous visit (from the past 48 hour(s)).  Blood Alcohol level:  Lab Results  Component Value Date   ETH <10 05/23/2023   ETH <10 04/27/2023    Metabolic Disorder Labs: Lab Results  Component Value Date   HGBA1C 5.3 04/30/2023   MPG 105 04/30/2023   MPG 105 08/28/2018   Lab Results  Component Value Date   PROLACTIN 6.7 08/28/2018   PROLACTIN 17.3 (H) 05/17/2017   Lab Results  Component Value Date   CHOL 168 04/30/2023   TRIG 108 04/30/2023   HDL 55 04/30/2023   CHOLHDL 3.1 04/30/2023   VLDL 22 04/30/2023   LDLCALC 91 04/30/2023   LDLCALC 104 (H) 08/28/2018    Physical Findings: AIMS:  , ,  ,  ,    CIWA:    COWS:     Musculoskeletal: Strength & Muscle Tone: within normal limits Gait & Station: normal Patient leans: N/A  Psychiatric Specialty Exam:  Presentation  General Appearance:  Appropriate for Environment; Disheveled; Other (comment) (dressed in hospital scrubs)  Eye Contact: Good  Speech: Clear and Coherent; Normal Rate  Speech Volume: Normal  Handedness: Right   Mood and Affect  Mood: Depressed; Anxious  Affect: Blunt   Thought Process  Thought Processes: Coherent; Goal Directed; Linear  Descriptions of Associations:Intact  Orientation:Full (Time, Place and Person)  Thought Content:Logical  History of Schizophrenia/Schizoaffective disorder:No  Duration of Psychotic Symptoms:No data recorded Hallucinations:No data recorded Ideas of Reference:None  Suicidal Thoughts:No data recorded Homicidal Thoughts:No data recorded  Sensorium  Memory: Immediate  Good  Judgment: Fair  Insight: Fair   Art therapist  Concentration: Fair  Attention Span: Fair  Recall: [employer] of Knowledge: Fair  Language: Fair   Psychomotor Activity  Psychomotor Activity:No data recorded  Assets  Assets: Communication Skills; Desire for Improvement; Financial Resources/Insurance;  Resilience   Sleep  Sleep:No data recorded    Blood pressure 106/75, pulse 72, temperature 97.9 F (36.6 C), temperature source Oral, resp. rate 18, height 5\' 8"  (1.727 m), weight 65.8 kg, SpO2 97 %. Body mass index is 22.05 kg/m.   Treatment Plan Summary: Daily contact with patient to assess and evaluate symptoms and progress in treatment, Medication management, and Plan continue current medications.  Breeley Bischof Tresea Mall, DO 05/27/2023, 2:10 PM

## 2023-05-28 NOTE — Plan of Care (Signed)
D- Patient alert and oriented. Patient presented in a sullen, but pleasant mood on assessment reporting that he slept "fair" last night and had no complaints to voice to this Clinical research associate. Patient endorsed both depression and anxiety to this writer stating "I don't know, just life, thinking about things", is why he feels this way. Patient also denied SI, HI, AVH, and pain at this time. Patient had no stated goals for today.  A- Scheduled medications administered to patient, per MD orders. Support and encouragement provided.  Routine safety checks conducted every 15 minutes.  Patient informed to notify staff with problems or concerns.  R- No adverse drug reactions noted. Patient contracts for safety at this time. Patient compliant with medications, but does not attend unit groups. Patient receptive, calm, and cooperative. Patient interacts well with others on the unit. Patient remains safe at this time.  Problem: Education: Goal: Knowledge of General Education information will improve Description: Including pain rating scale, medication(s)/side effects and non-pharmacologic comfort measures Outcome: Not Progressing   Problem: Health Behavior/Discharge Planning: Goal: Ability to manage health-related needs will improve Outcome: Not Progressing   Problem: Clinical Measurements: Goal: Ability to maintain clinical measurements within normal limits will improve Outcome: Not Progressing Goal: Will remain free from infection Outcome: Not Progressing Goal: Diagnostic test results will improve Outcome: Not Progressing Goal: Respiratory complications will improve Outcome: Not Progressing Goal: Cardiovascular complication will be avoided Outcome: Not Progressing   Problem: Activity: Goal: Risk for activity intolerance will decrease Outcome: Not Progressing   Problem: Nutrition: Goal: Adequate nutrition will be maintained Outcome: Not Progressing   Problem: Coping: Goal: Level of anxiety will  decrease Outcome: Not Progressing   Problem: Elimination: Goal: Will not experience complications related to bowel motility Outcome: Not Progressing Goal: Will not experience complications related to urinary retention Outcome: Not Progressing   Problem: Pain Managment: Goal: General experience of comfort will improve Outcome: Not Progressing   Problem: Safety: Goal: Ability to remain free from injury will improve Outcome: Not Progressing   Problem: Skin Integrity: Goal: Risk for impaired skin integrity will decrease Outcome: Not Progressing   Problem: Education: Goal: Knowledge of [place] [facility] Education information/materials will improve Outcome: Not Progressing Goal: Emotional status will improve Outcome: Not Progressing Goal: Mental status will improve Outcome: Not Progressing Goal: Verbalization of understanding the information provided will improve Outcome: Not Progressing   Problem: Activity: Goal: Interest or engagement in activities will improve Outcome: Not Progressing Goal: Sleeping patterns will improve Outcome: Not Progressing   Problem: Coping: Goal: Ability to verbalize frustrations and anger appropriately will improve Outcome: Not Progressing Goal: Ability to demonstrate self-control will improve Outcome: Not Progressing   Problem: Health Behavior/Discharge Planning: Goal: Identification of resources available to assist in meeting health care needs will improve Outcome: Not Progressing Goal: Compliance with treatment plan for underlying cause of condition will improve Outcome: Not Progressing   Problem: Physical Regulation: Goal: Ability to maintain clinical measurements within normal limits will improve Outcome: Not Progressing   Problem: Safety: Goal: Periods of time without injury will increase Outcome: Not Progressing   Problem: Education: Goal: Ability to make informed decisions regarding treatment will improve Outcome: Not  Progressing   Problem: Coping: Goal: Coping ability will improve Outcome: Not Progressing   Problem: Health Behavior/Discharge Planning: Goal: Identification of resources available to assist in meeting health care needs will improve Outcome: Not Progressing   Problem: Medication: Goal: Compliance with prescribed medication regimen will improve Outcome: Not Progressing   Problem: Self-Concept: Goal:  Ability to disclose and discuss suicidal ideas will improve Outcome: Not Progressing Goal: Will verbalize positive feelings about self Outcome: Not Progressing   Problem: Education: Goal: Utilization of techniques to improve thought processes will improve Outcome: Not Progressing Goal: Knowledge of the prescribed therapeutic regimen will improve Outcome: Not Progressing   Problem: Activity: Goal: Interest or engagement in leisure activities will improve Outcome: Not Progressing Goal: Imbalance in normal sleep/wake cycle will improve Outcome: Not Progressing   Problem: Coping: Goal: Coping ability will improve Outcome: Not Progressing Goal: Will verbalize feelings Outcome: Not Progressing   Problem: Health Behavior/Discharge Planning: Goal: Ability to make decisions will improve Outcome: Not Progressing Goal: Compliance with therapeutic regimen will improve Outcome: Not Progressing   Problem: Role Relationship: Goal: Will demonstrate positive changes in social behaviors and relationships Outcome: Not Progressing   Problem: Safety: Goal: Ability to disclose and discuss suicidal ideas will improve Outcome: Not Progressing Goal: Ability to identify and utilize support systems that promote safety will improve Outcome: Not Progressing   Problem: Self-Concept: Goal: Will verbalize positive feelings about self Outcome: Not Progressing Goal: Level of anxiety will decrease Outcome: Not Progressing

## 2023-05-28 NOTE — Group Note (Signed)
[facility] LCSW Group Therapy Note    Group Date: 05/28/2023 Start Time: 1300 End Time: 1350  Type of Therapy and Topic:  Group Therapy:  Overcoming Obstacles  Participation Level:  [facility] PARTICIPATION LEVEL: None   Description of Group:   In this group patients will be encouraged to explore what they see as obstacles to their own wellness and recovery. They will be guided to discuss their thoughts, feelings, and behaviors related to these obstacles. The group will process together ways to cope with barriers, with attention given to specific choices patients can make. Each patient will be challenged to identify changes they are motivated to make in order to overcome their obstacles. This group will be process-oriented, with patients participating in exploration of their own experiences as well as giving and receiving support and challenge from other group members.  Therapeutic Goals: 1. Patient will identify personal and current obstacles as they relate to admission. 2. Patient will identify barriers that currently interfere with their wellness or overcoming obstacles.  3. Patient will identify feelings, thought process and behaviors related to these barriers. 4. Patient will identify two changes they are willing to make to overcome these obstacles:    Summary of Patient Progress Patient was present for the entirety of the group process. He participated in the icebreaker but outside of this he did not contribute to the larger group discussion.   Therapeutic Modalities:   Cognitive Behavioral Therapy Solution Focused Therapy Motivational Interviewing Relapse Prevention Therapy   Glenis Smoker, LCSW

## 2023-05-28 NOTE — Group Note (Signed)
Date:  05/28/2023 Time:  9:10 PM  Group Topic/Focus:  Wrap-Up Group:   The focus of this group is to help patients review their daily goal of treatment and discuss progress on daily workbooks.    Participation Level:  Active  Participation Quality:  Appropriate and Attentive  Affect:  Appropriate  Cognitive:  Appropriate  Insight: Appropriate  Engagement in Group:  Supportive  Modes of Intervention:  Discussion  Additional Comments:     Belva Crome 05/28/2023, 9:10 PM

## 2023-05-28 NOTE — Progress Notes (Addendum)
Patient ID: Angel [name], male   DOB: October 24, 1981, 42 y.o.   MRN: [id number] [facility] MD Progress Note  05/28/2023 2:48 PM Angel [name]  MRN:  [id number] Subjective: Angel [name] was seen on rounds.  He reports feeling "a little sad today and worried" which he attributes to concerns about discharge planning and where he will go. Endorses housing instability and will likely need to go to a shelter. He denies SI/HI, AVH, paranoia. He feels current medication regimen is working for him. Declines medication side effects.  Principal Problem: MDD (major depressive disorder), recurrent severe, without psychosis (HCC) Diagnosis: Principal Problem:   MDD (major depressive disorder), recurrent severe, without psychosis (HCC)  Total Time spent with patient: 15 minutes  Past Psychiatric History: Depression  Past Medical History:  Past Medical History:  Diagnosis Date   Anxiety    Depression    History of admission to inpatient psychiatry department 05/03/2023   Migraine    Suicidal ideation 05/03/2023   History reviewed. No pertinent surgical history. Family History:  Family History  Problem Relation Age of Onset   Cancer Mother        brain   Diabetes Brother    Hypertension Brother    Family Psychiatric  History: Unremarkable Social History:  Social History   Substance and Sexual Activity  Alcohol Use No     Social History   Substance and Sexual Activity  Drug Use Yes   Types: Marijuana    Social History   Socioeconomic History   Marital status: Single    Spouse name: Not on file   Number of children: Not on file   Years of education: Not on file   Highest education level: Not on file  Occupational History   Not on file  Tobacco Use   Smoking status: Every Day    Packs/day: 1.50    Years: 20.00    Additional pack years: 0.00    Total pack years: 30.00    Types: Cigarettes   Smokeless tobacco: Never  Vaping Use   Vaping Use: Never used  Substance and Sexual Activity    Alcohol use: No   Drug use: Yes    Types: Marijuana   Sexual activity: Not Currently    Birth control/protection: Condom  Other Topics Concern   Not on file  Social History Narrative   Not on file   Social Determinants of Health   Financial Resource Strain: Not on file  Food Insecurity: Food Insecurity Present (05/23/2023)   Hunger Vital Sign    Worried About Running Out of Food in the Last Year: Often true    Ran Out of Food in the Last Year: Often true  Transportation Needs: Unmet Transportation Needs (05/23/2023)   PRAPARE - Administrator, Civil Service (Medical): No    Lack of Transportation (Non-Medical): Yes  Physical Activity: Not on file  Stress: Not on file  Social Connections: Not on file   Additional Social History:                         Sleep: Good  Appetite:  Fair  Current Medications: Current Facility-Administered Medications  Medication Dose Route Frequency Provider Last Rate Last Admin   acetaminophen (TYLENOL) tablet 650 mg  650 mg Oral Q4H PRN Lenard Lance, FNP       diphenhydrAMINE (BENADRYL) capsule 50 mg  50 mg Oral TID PRN Lenard Lance, FNP  Or   diphenhydrAMINE (BENADRYL) injection 50 mg  50 mg Intramuscular TID PRN Lenard Lance, FNP       feeding supplement (ENSURE ENLIVE / ENSURE PLUS) liquid 237 mL  237 mL Oral TID BM Reggie Pile, MD   237 mL at 05/28/23 1100   FLUoxetine (PROZAC) capsule 40 mg  40 mg Oral Daily Lenard Lance, FNP   40 mg at 05/28/23 0830   haloperidol (HALDOL) tablet 5 mg  5 mg Oral TID PRN Lenard Lance, FNP       Or   haloperidol lactate (HALDOL) injection 5 mg  5 mg Intramuscular TID PRN Lenard Lance, FNP       hydrOXYzine (ATARAX) tablet 25 mg  25 mg Oral TID PRN Lenard Lance, FNP   25 mg at 05/28/23 0830   LORazepam (ATIVAN) tablet 2 mg  2 mg Oral TID PRN Lenard Lance, FNP       Or   LORazepam (ATIVAN) injection 2 mg  2 mg Intramuscular TID PRN Lenard Lance, FNP       multivitamin with  minerals tablet 1 tablet  1 tablet Oral Daily Reggie Pile, MD   1 tablet at 05/28/23 0830   nicotine (NICODERM CQ - dosed in mg/24 hours) patch 14 mg  14 mg Transdermal Daily Lenard Lance, FNP   14 mg at 05/28/23 0830   OLANZapine (ZYPREXA) tablet 10 mg  10 mg Oral QHS Sarina Ill, DO   10 mg at 05/27/23 2115   pantoprazole (PROTONIX) EC tablet 40 mg  40 mg Oral Daily Lenard Lance, FNP   40 mg at 05/28/23 0830   traZODone (DESYREL) tablet 50 mg  50 mg Oral QHS PRN Lenard Lance, FNP   50 mg at 05/27/23 2115    Lab Results: No results found for this or any previous visit (from the past 48 hour(s)).  Blood Alcohol level:  Lab Results  Component Value Date   ETH <10 05/23/2023   ETH <10 04/27/2023    Metabolic Disorder Labs: Lab Results  Component Value Date   HGBA1C 5.3 04/30/2023   MPG 105 04/30/2023   MPG 105 08/28/2018   Lab Results  Component Value Date   PROLACTIN 6.7 08/28/2018   PROLACTIN 17.3 (H) 05/17/2017   Lab Results  Component Value Date   CHOL 168 04/30/2023   TRIG 108 04/30/2023   HDL 55 04/30/2023   CHOLHDL 3.1 04/30/2023   VLDL 22 04/30/2023   LDLCALC 91 04/30/2023   LDLCALC 104 (H) 08/28/2018    Physical Findings: AIMS:  , ,  ,  ,    CIWA:    COWS:     Musculoskeletal: Strength & Muscle Tone: within normal limits Gait & Station: normal Patient leans: N/A  Psychiatric Specialty Exam:  Presentation  General Appearance:  Appropriate for Environment; Disheveled; Other (comment) (dressed in hospital scrubs)  Eye Contact: Good  Speech: Clear and Coherent; Normal Rate  Speech Volume: Normal  Handedness: Right   Mood and Affect  Mood: "a little sad today and worried"  Affect: Blunt   Thought Process  Thought Processes: Coherent; Goal Directed; Linear  Descriptions of Associations:Intact  Orientation:Full (Time, Place and Person)  Thought Content:Logical  History of Schizophrenia/Schizoaffective  disorder:No  Duration of Psychotic Symptoms:No data recorded Hallucinations:Denies Ideas of Reference:Denies  Suicidal Thoughts:Denies Homicidal Thoughts Denies  Sensorium  Memory: Immediate Good  Judgment: Fair  Insight: Fair   Art therapist  Concentration: Fair  Attention Span: Fair  Recall: [employer] of Knowledge: Fair  Language: Fair   Psychomotor Activity  Psychomotor Activity:No data recorded  Assets  Assets: Communication Skills; Desire for Improvement; Financial Resources/Insurance; Resilience   Sleep  Sleep:No data recorded    Blood pressure 96/69, pulse 76, temperature 97.7 F (36.5 C), temperature source Oral, resp. rate (!) 21, height 5\' 8"  (1.727 m), weight 65.8 kg, SpO2 99 %. Body mass index is 22.05 kg/m.   Treatment Plan Summary: Daily contact with patient to assess and evaluate symptoms and progress in treatment, Medication management, and Plan continue current medications.  Lauree Chandler, NP 05/28/2023, 2:48 PM

## 2023-05-28 NOTE — Group Note (Signed)
Date:  05/28/2023 Time:  9:00 AM  Group Topic/Focus:  Goals Group:   The focus of this group is to help patients establish daily goals to achieve during treatment and discuss how the patient can incorporate goal setting into their daily lives to aide in recovery.  Community Group   Participation Level:  Did Not Attend  Angel [name] A Fidelis Loth 05/28/2023, 5:35 PM

## 2023-05-28 NOTE — Group Note (Signed)
Date:  05/28/2023 Time:  6:57 AM  Group Topic/Focus:  Wrap-Up Group:   The focus of this group is to help patients review their daily goal of treatment and discuss progress on daily workbooks.    Participation Level:  Active  Participation Quality:  Appropriate  Affect:  Appropriate  Cognitive:  Alert  Insight: Appropriate  Engagement in Group:  Limited  Modes of Intervention:  Discussion  Additional Comments:     [name],Angel Llewellyn E 05/28/2023, 6:57 AM

## 2023-05-28 NOTE — Progress Notes (Signed)
The patient did not attend group and rested in his room for the evening.  He was awakened for evening medication pass and accepted his medications as ordered.  Angel [name] slept until morning.  No issues to note.

## 2023-05-28 NOTE — Progress Notes (Signed)
Patient presents with bright affect. Voiced no concerns or complaints. Denies Si, Hi, AVh. Medication compliant, requesting prn for sleep with good relief. Noted in dayroom with peers, minimal interanction.  Encouragement and support provided. Safety checks maintained. Medications given as prescribed. Pt receptive and remains safe on unit with q 15 min checks.

## 2023-05-28 NOTE — Group Note (Signed)
Date:  05/28/2023 Time:  3:12 PM  Group Topic/Focus:  Activity Group:  The focus of this group was to encourage patients to come outside to the courtyard and get some exercise and fresh air to help assist with their physical and mental wellbeing.    Participation Level:  Did Not Attend   Angel [name] Angel [name] Angel [name] 05/28/2023, 3:12 PM

## 2023-05-28 NOTE — Group Note (Signed)
Recreation Therapy Group Note   Group Topic:Goal Setting  Group Date: 05/28/2023 Start Time: 1000 End Time: 1115 Facilitators: Clinton Gallant, CTRS Location:  Craft Room  Group Description: Vision Board. Patients were given many different magazines, a glue stick, markers, and a piece of cardstock paper. LRT and pts discussed the importance of having goals in life. LRT and pts discussed the difference between short-term and long-term goals, as well as what a SMART goal is. LRT encouraged pts to create a vision board, with images they picked and then cut out with safety scissors from the magazine, for themselves, that capture their short and long-term goals. LRT encouraged pts to show and explain their vision board to the group. LRT offered to laminate vision board once dry and complete.   Goal Area(s) Addressed:  Patient will gain knowledge of short vs. long term goals.  Patient will identify goals for themselves. Patient will practice setting SMART goals. Patient will verbalize their goals to LRT and peers.  Affect/Mood: N/A   Participation Level: Did not attend    Clinical Observations/Individualized Feedback: Patient did not attend group.  Plan: Continue to engage patient in RT group sessions 2-3x/week.   Rosina Lowenstein, LRT, CTRS 05/28/2023 11:41 AM

## 2023-05-29 MED ORDER — FLUOXETINE HCL 20 MG PO CAPS: 40.0000 mg | ORAL_CAPSULE | Freq: Every day | ORAL | 3 refills | Status: DC

## 2023-05-29 MED ORDER — TRAZODONE HCL 50 MG PO TABS: 50.0000 mg | ORAL_TABLET | Freq: Every day | ORAL | 3 refills | Status: DC

## 2023-05-29 MED ORDER — OLANZAPINE 10 MG PO TABS: 10.0000 mg | ORAL_TABLET | Freq: Every day | ORAL | 3 refills | Status: DC

## 2023-05-29 NOTE — Group Note (Signed)
Date:  05/29/2023 Time:  11:00 AM  Group Topic/Focus:  Activity Group:  The purpose of this activity group is to encourage the patients to come outside to the courtyard and get some fresh air and some exercise to contribute to their physical and mental wellbeing.    Participation Level:  Active  Participation Quality:  Appropriate  Affect:  Appropriate  Cognitive:  Appropriate  Insight: Appropriate  Engagement in Group:  Engaged  Modes of Intervention:  Activity  Additional Comments:    Angel [name] Sella Angel [name] 05/29/2023, 11:00 AM

## 2023-05-29 NOTE — Group Note (Signed)
Date:  05/29/2023 Time:  2:00 PM  Group Topic/Focus:  Healthy Communication:   The focus of this group is to discuss communication, barriers to communication, as well as healthy ways to communicate with others.  Community Group   Participation Level:  Did Not Attend  Melizza Kanode A Yaira Bernardi 05/29/2023, 5:05 PM

## 2023-05-29 NOTE — [facility] Suicide Risk Assessment (Signed)
[facility] Discharge Suicide Risk Assessment   Principal Problem: MDD (major depressive disorder), recurrent severe, without psychosis (HCC) Discharge Diagnoses: Principal Problem:   MDD (major depressive disorder), recurrent severe, without psychosis (HCC)   Total Time spent with patient: 1 hour  Musculoskeletal: Strength & Muscle Tone: within normal limits Gait & Station: normal Patient leans: N/A  Psychiatric Specialty Exam  Presentation  General Appearance:  Appropriate for Environment; Disheveled; Other (comment) (dressed in hospital scrubs)  Eye Contact: Good  Speech: Clear and Coherent; Normal Rate  Speech Volume: Normal  Handedness: Right   Mood and Affect  Mood: Depressed; Anxious  Duration of Depression Symptoms: Greater than two weeks  Affect: Blunt   Thought Process  Thought Processes: Coherent; Goal Directed; Linear  Descriptions of Associations:Intact  Orientation:Full (Time, Place and Person)  Thought Content:Logical  History of Schizophrenia/Schizoaffective disorder:No  Duration of Psychotic Symptoms:No data recorded Hallucinations:No data recorded Ideas of Reference:None  Suicidal Thoughts:No data recorded Homicidal Thoughts:No data recorded  Sensorium  Memory: Immediate Good  Judgment: Fair  Insight: Fair   Art therapist  Concentration: Fair  Attention Span: Fair  Recall: [employer] of Knowledge: Fair  Language: Fair   Psychomotor Activity  Psychomotor Activity:No data recorded  Assets  Assets: Communication Skills; Desire for Improvement; Financial Resources/Insurance; Resilience   Sleep  Sleep:No data recorded  Physical Exam: Physical Exam Vitals and nursing note reviewed.  Constitutional:      Appearance: Normal appearance. He is normal weight.  Neurological:     General: No focal deficit present.     Mental Status: He is alert and oriented to person, place, and time.  Psychiatric:         Attention and Perception: Attention and perception normal.        Mood and Affect: Mood and affect normal.        Speech: Speech normal.        Behavior: Behavior normal. Behavior is cooperative.        Thought Content: Thought content normal.        Cognition and Memory: Cognition and memory normal.        Judgment: Judgment normal.    Review of Systems  Constitutional: Negative.   HENT: Negative.    Eyes: Negative.   Respiratory: Negative.    Cardiovascular: Negative.   Gastrointestinal: Negative.   Genitourinary: Negative.   Musculoskeletal: Negative.   Skin: Negative.   Neurological: Negative.   Endo/Heme/Allergies: Negative.   Psychiatric/Behavioral: Negative.     Blood pressure 99/71, pulse 80, temperature 97.9 F (36.6 C), temperature source Oral, resp. rate (!) 21, height 5\' 8"  (1.727 m), weight 65.8 kg, SpO2 100 %. Body mass index is 22.05 kg/m.  Mental Status Per Nursing Assessment::   On Admission:  Suicidal ideation indicated by patient  Demographic Factors:  Male and Low socioeconomic status  Loss Factors: NA  Historical Factors: NA  Risk Reduction Factors:   NA  Continued Clinical Symptoms:  Depression:   Anhedonia  Cognitive Features That Contribute To Risk:  None    Suicide Risk:  Minimal: No identifiable suicidal ideation.  Patients presenting with no risk factors but with morbid ruminations; may be classified as minimal risk based on the severity of the depressive symptoms   Follow-up Information     [facility] Follow up.   Why: Your appointment is scheduled for Tuesday, 06/05/23 at 10AM. Thanks! Contact information: [address] [place] [address] [phone]  Plan Of Care/Follow-up recommendations: [facility]   Sarina Ill, DO 05/29/2023, 2:14 PM

## 2023-05-29 NOTE — Group Note (Signed)
Recreation Therapy Group Note   Group Topic:Coping Skills  Group Date: 05/29/2023 Start Time: 1000 End Time: 1050 Facilitators: Rosina Lowenstein, LRT, CTRS Location: Courtyard  Group Description: Mind Map.  Patient was provided a blank template of a diagram with 32 blank boxes in a tiered system, branching from the center (similar to a bubble chart). LRT directed patients to label the middle of the diagram "Coping Skills". LRT and patients then came up with 8 different coping skills as examples. Pt were directed to record their coping skills in the 2nd tier boxes closest to the center.  Patients would then share their coping skills with the group as LRT wrote them out. LRT gave a handout of 100 different coping skills at the end of group.   Goal Area(s) Addressed: Patients will be able to define "coping skills". Patient will identify new coping skills.  Patient will identify new possible leisure interests.    Affect/Mood: N/A   Participation Level: Did not attend    Clinical Observations/Individualized Feedback: Patient did not attend group.  Plan: Continue to engage patient in RT group sessions 2-3x/week.   Rosina Lowenstein, LRT, CTRS 05/29/2023 11:03 AM

## 2023-05-29 NOTE — Plan of Care (Addendum)
D- Patient alert and oriented. Patient presented in a sullen, but pleasant mood on assessment reporting that he slept "fair" last night and had no complaints to voice to this Clinical research associate. Patient continues to endorse hopelessness, depression, and anxiety, rating them and "8/10", and "10/10". Patient also endorsed passive SI, but he does contract with this Clinical research associate. Patient denied HI/AVH, and pain at this time. Patient had no stated goals for today.  A- Scheduled medications administered to patient, per MD orders. Support and encouragement provided.  Routine safety checks conducted every 15 minutes.  Patient informed to notify staff with problems or concerns.  R- No adverse drug reactions noted. Patient contracts for safety at this time. Patient compliant with medications and treatment plan. Patient receptive, calm, and cooperative. Patient interacts well with others on the unit. Patient remains safe at this time.  Problem: Education: Goal: Knowledge of General Education information will improve Description: Including pain rating scale, medication(s)/side effects and non-pharmacologic comfort measures Outcome: Adequate for Discharge   Problem: Health Behavior/Discharge Planning: Goal: Ability to manage health-related needs will improve Outcome: Adequate for Discharge   Problem: Clinical Measurements: Goal: Ability to maintain clinical measurements within normal limits will improve Outcome: Adequate for Discharge Goal: Will remain free from infection Outcome: Adequate for Discharge Goal: Diagnostic test results will improve Outcome: Adequate for Discharge Goal: Respiratory complications will improve Outcome: Adequate for Discharge Goal: Cardiovascular complication will be avoided Outcome: Adequate for Discharge   Problem: Activity: Goal: Risk for activity intolerance will decrease Outcome: Adequate for Discharge   Problem: Nutrition: Goal: Adequate nutrition will be maintained Outcome:  Adequate for Discharge   Problem: Coping: Goal: Level of anxiety will decrease Outcome: Adequate for Discharge   Problem: Elimination: Goal: Will not experience complications related to bowel motility Outcome: Adequate for Discharge Goal: Will not experience complications related to urinary retention Outcome: Adequate for Discharge   Problem: Pain Managment: Goal: General experience of comfort will improve Outcome: Adequate for Discharge   Problem: Safety: Goal: Ability to remain free from injury will improve Outcome: Adequate for Discharge   Problem: Skin Integrity: Goal: Risk for impaired skin integrity will decrease Outcome: Adequate for Discharge   Problem: Education: Goal: Knowledge of [name] General Education information/materials will improve Outcome: Adequate for Discharge Goal: Emotional status will improve Outcome: Adequate for Discharge Goal: Mental status will improve Outcome: Adequate for Discharge Goal: Verbalization of understanding the information provided will improve Outcome: Adequate for Discharge   Problem: Activity: Goal: Interest or engagement in activities will improve Outcome: Adequate for Discharge Goal: Sleeping patterns will improve Outcome: Adequate for Discharge   Problem: Coping: Goal: Ability to verbalize frustrations and anger appropriately will improve Outcome: Adequate for Discharge Goal: Ability to demonstrate self-control will improve Outcome: Adequate for Discharge   Problem: Health Behavior/Discharge Planning: Goal: Identification of resources available to assist in meeting health care needs will improve Outcome: Adequate for Discharge Goal: Compliance with treatment plan for underlying cause of condition will improve Outcome: Adequate for Discharge   Problem: Physical Regulation: Goal: Ability to maintain clinical measurements within normal limits will improve Outcome: Adequate for Discharge   Problem: Safety: Goal:  Periods of time without injury will increase Outcome: Adequate for Discharge   Problem: Education: Goal: Ability to make informed decisions regarding treatment will improve Outcome: Adequate for Discharge   Problem: Coping: Goal: Coping ability will improve Outcome: Adequate for Discharge   Problem: Health Behavior/Discharge Planning: Goal: Identification of resources available to assist in meeting health care needs  will improve Outcome: Adequate for Discharge   Problem: Medication: Goal: Compliance with prescribed medication regimen will improve Outcome: Adequate for Discharge   Problem: Self-Concept: Goal: Ability to disclose and discuss suicidal ideas will improve Outcome: Adequate for Discharge Goal: Will verbalize positive feelings about self Outcome: Adequate for Discharge   Problem: Education: Goal: Utilization of techniques to improve thought processes will improve Outcome: Adequate for Discharge Goal: Knowledge of the prescribed therapeutic regimen will improve Outcome: Adequate for Discharge   Problem: Activity: Goal: Interest or engagement in leisure activities will improve Outcome: Adequate for Discharge Goal: Imbalance in normal sleep/wake cycle will improve Outcome: Adequate for Discharge   Problem: Coping: Goal: Coping ability will improve Outcome: Adequate for Discharge Goal: Will verbalize feelings Outcome: Adequate for Discharge   Problem: Health Behavior/Discharge Planning: Goal: Ability to make decisions will improve Outcome: Adequate for Discharge Goal: Compliance with therapeutic regimen will improve Outcome: Adequate for Discharge   Problem: Role Relationship: Goal: Will demonstrate positive changes in social behaviors and relationships Outcome: Adequate for Discharge   Problem: Safety: Goal: Ability to disclose and discuss suicidal ideas will improve Outcome: Adequate for Discharge Goal: Ability to identify and utilize support systems  that promote safety will improve Outcome: Adequate for Discharge   Problem: Self-Concept: Goal: Will verbalize positive feelings about self Outcome: Adequate for Discharge Goal: Level of anxiety will decrease Outcome: Adequate for Discharge

## 2023-05-29 NOTE — Progress Notes (Signed)
D- Patient alert and oriented. Patient presents in a sullen, but pleasant mood on assessment reporting that he slept "fair" last night and had no complaints to voice to this Clinical research associate. Patient continues to endorse passive SI, stating "a little bit", but contracted with this Clinical research associate. Patient also endorsed hopelessness, depression, and anxiety, stating "just life". Patient denies HI/AVH and pain at this time. Patient had no stated goals for today.  A- Scheduled medications administered to patient, per MD orders. Support and encouragement provided. Routine safety checks conducted every 15 minutes. Patient informed to notify staff with problems or concerns.  R- No adverse drug reactions noted. Patient contracts for safety at this time. Patient compliant with medications and treatment plan. Patient receptive, calm, and cooperative. Patient interacts well with others on the unit. Patient remains safe at this time.

## 2023-05-29 NOTE — Discharge Summary (Signed)
Physician Discharge Summary Note  Patient:  Angel [name] is an 42 y.o., male MRN:  [id number] DOB:  Aug 10, 1981 Patient phone:  [phone] (home)  Patient address:   [address] [place] [address],  Total Time spent with patient: 1 hour  Date of Admission:  05/23/2023 Date of Discharge: 05/29/2023  Reason for Admission:  Patient chart reviewed. Patient is a 42 y/o male w/ history of MDD, GAD, tobacco use disorder admitted to inpatient psychiatry as a transfer from [facility], where he had presented for suicidal ideations with a plan to hang himself if "I did not get some help". Had reported his sister abandoned him "left me in the middle of street, with my personal belongings, and drove off". He has had several inpatient psychiatric hospitalizations, last earlier this month at [facility] from 04/28/23-05/03/23.   Patient reports he has been "depressed for quite a while", reports worsening for the past couple of weeks following his discharge from [facility]. Reports he took his medications for about 3 days following discharge but was not able to pick up his prescriptions has has not taken them since. He states he feels he just wants to die. He is reporting poor sleep and appetite. He reports he has been experiencing suicidal ideations for the past couple of days. He continues to experience suicidal ideations w/ plan to hang himself. He states current stressor is homelessness. He is able to contract for safety in the hospital. He denies homicidal ideations. He denies auditory visual hallucinations or paranoia.   He reports using cigarettes 1ppd, and marijuana "here and there", last use was 1 to 2 months ago. He denies use of crack/cocaine, methamphetamines, opioids, alcohol.    Patient endorses "couple" of suicide attempts. Reports last suicide attempt was "couple months ago" when he tried to slit his wrist. Patient endorses history of non suicidal self injurious behavior, cutting himself, last occurring couple  of days ago. He has lacerations b/l on his upper extremities. No active bleeding or drainage.   Principal Problem: MDD (major depressive disorder), recurrent severe, without psychosis (HCC) Discharge Diagnoses: Principal Problem:   MDD (major depressive disorder), recurrent severe, without psychosis (HCC)   Past Psychiatric History: Depression  Past Medical History:  Past Medical History:  Diagnosis Date   Anxiety    Depression    History of admission to inpatient psychiatry department 05/03/2023   Migraine    Suicidal ideation 05/03/2023   History reviewed. No pertinent surgical history. Family History:  Family History  Problem Relation Age of Onset   Cancer Mother        brain   Diabetes Brother    Hypertension Brother    Family Psychiatric  History: Unremarkable Social History:  Social History   Substance and Sexual Activity  Alcohol Use No     Social History   Substance and Sexual Activity  Drug Use Yes   Types: Marijuana    Social History   Socioeconomic History   Marital status: Single    Spouse name: Not on file   Number of children: Not on file   Years of education: Not on file   Highest education level: Not on file  Occupational History   Not on file  Tobacco Use   Smoking status: Every Day    Packs/day: 1.50    Years: 20.00    Additional pack years: 0.00    Total pack years: 30.00    Types: Cigarettes   Smokeless tobacco: Never  Vaping Use  Vaping Use: Never used  Substance and Sexual Activity   Alcohol use: No   Drug use: Yes    Types: Marijuana   Sexual activity: Not Currently    Birth control/protection: Condom  Other Topics Concern   Not on file  Social History Narrative   Not on file   Social Determinants of Health   Financial Resource Strain: Not on file  Food Insecurity: Food Insecurity Present (05/23/2023)   Hunger Vital Sign    Worried About Running Out of Food in the Last Year: Often true    Ran Out of Food in the Last  Year: Often true  Transportation Needs: Unmet Transportation Needs (05/23/2023)   PRAPARE - Administrator, Civil Service (Medical): No    Lack of Transportation (Non-Medical): Yes  Physical Activity: Not on file  Stress: Not on file  Social Connections: Not on file    Hospital Course: Kathlene November was admitted voluntarily to inpatient psychiatry for worsening depression.  Apparently, his sister kicked him out of her house with no reason.  He has had several psychiatric admissions in the past.  He currently denied any suicidal ideation.  He denied any drug use.  He was placed on Zyprexa and Prozac and did well with these medications.  His mood slowly improved and was felt that he maximized hospitalization and he was discharged back to his home town of [place].  I believe his sister lives in [place].  Physical Findings: AIMS:  , ,  ,  ,    CIWA:    COWS:     Musculoskeletal: Strength & Muscle Tone: within normal limits Gait & Station: normal Patient leans: N/A   Psychiatric Specialty Exam:  Presentation  General Appearance:  Appropriate for Environment; Disheveled; Other (comment) (dressed in hospital scrubs)  Eye Contact: Good  Speech: Clear and Coherent; Normal Rate  Speech Volume: Normal  Handedness: Right   Mood and Affect  Mood: Depressed; Anxious  Affect: Blunt   Thought Process  Thought Processes: Coherent; Goal Directed; Linear  Descriptions of Associations:Intact  Orientation:Full (Time, Place and Person)  Thought Content:Logical  History of Schizophrenia/Schizoaffective disorder:No  Duration of Psychotic Symptoms:No data recorded Hallucinations:No data recorded Ideas of Reference:None  Suicidal Thoughts:No data recorded Homicidal Thoughts:No data recorded  Sensorium  Memory: Immediate Good  Judgment: Fair  Insight: Fair   Art therapist  Concentration: Fair  Attention Span: Fair  Recall: [employer] of  Knowledge: Fair  Language: Fair   Psychomotor Activity  Psychomotor Activity:No data recorded  Assets  Assets: Communication Skills; Desire for Improvement; Financial Resources/Insurance; Resilience   Sleep  Sleep:No data recorded   Physical Exam: Physical Exam Vitals and nursing note reviewed.  Constitutional:      Appearance: Normal appearance. He is normal weight.  Neurological:     General: No focal deficit present.     Mental Status: He is alert and oriented to person, place, and time.  Psychiatric:        Attention and Perception: Attention and perception normal.        Mood and Affect: Mood and affect normal.        Speech: Speech normal.        Behavior: Behavior normal. Behavior is cooperative.        Thought Content: Thought content normal.        Cognition and Memory: Cognition and memory normal.        Judgment: Judgment normal.    Review of Systems  Constitutional: Negative.   HENT: Negative.    Eyes: Negative.   Respiratory: Negative.    Cardiovascular: Negative.   Gastrointestinal: Negative.   Genitourinary: Negative.   Musculoskeletal: Negative.   Skin: Negative.   Neurological: Negative.   Endo/Heme/Allergies: Negative.   Psychiatric/Behavioral:  Positive for depression.    Blood pressure 99/71, pulse 80, temperature 97.9 F (36.6 C), temperature source Oral, resp. rate (!) 21, height 5\' 8"  (1.727 m), weight 65.8 kg, SpO2 100 %. Body mass index is 22.05 kg/m.   Social History   Tobacco Use  Smoking Status Every Day   Packs/day: 1.50   Years: 20.00   Additional pack years: 0.00   Total pack years: 30.00   Types: Cigarettes  Smokeless Tobacco Never   Tobacco Cessation:  A prescription for an FDA-approved tobacco cessation medication was offered at discharge and the patient refused   Blood Alcohol level:  Lab Results  Component Value Date   [facility] <10 05/23/2023   ETH <10 04/27/2023    Metabolic Disorder Labs:  Lab Results   Component Value Date   HGBA1C 5.3 04/30/2023   MPG 105 04/30/2023   MPG 105 08/28/2018   Lab Results  Component Value Date   PROLACTIN 6.7 08/28/2018   PROLACTIN 17.3 (H) 05/17/2017   Lab Results  Component Value Date   CHOL 168 04/30/2023   TRIG 108 04/30/2023   HDL 55 04/30/2023   CHOLHDL 3.1 04/30/2023   VLDL 22 04/30/2023   LDLCALC 91 04/30/2023   LDLCALC 104 (H) 08/28/2018    See Psychiatric Specialty Exam and Suicide Risk Assessment completed by Attending Physician prior to discharge.  Discharge destination:  Home  Is patient on multiple antipsychotic therapies at discharge:  No   Has Patient had three or more failed trials of antipsychotic monotherapy by history:  No  Recommended Plan for Multiple Antipsychotic Therapies: NA   Allergies as of 05/29/2023   No Known Allergies      Medication List     STOP taking these medications    hydrOXYzine 25 MG tablet Commonly known as: ATARAX   mirtazapine 7.5 MG tablet Commonly known as: REMERON   nicotine 14 mg/24hr patch Commonly known as: NICODERM CQ - dosed in mg/24 hours   pantoprazole 20 MG tablet Commonly known as: PROTONIX       TAKE these medications      Indication  FLUoxetine 20 MG capsule Commonly known as: PROZAC Take 2 capsules (40 mg total) by mouth daily. What changed: medication strength  Indication: Depression   OLANZapine 10 MG tablet Commonly known as: ZYPREXA Take 1 tablet (10 mg total) by mouth at bedtime.  Indication: Depression   traZODone 50 MG tablet Commonly known as: DESYREL Take 1 tablet (50 mg total) by mouth at bedtime. What changed:  when to take this reasons to take this  Indication: Anxiety Disorder, Trouble Sleeping, Major Depressive Disorder        Follow-up Information     [facility] Follow up.   Why: Your appointment is scheduled for Tuesday, 06/05/23 at 10AM. Thanks! Contact information: [address] [place]  [address] [phone]                 Follow-up recommendations:  [facility]  Signed: Sarina Ill, DO 05/29/2023, 2:22 PM

## 2023-05-30 NOTE — Group Note (Signed)
Date:  05/30/2023 Time:  10:57 AM  Group Topic/Focus:  Goals Group:   The focus of this group is to help patients establish daily goals to achieve during treatment and discuss how the patient can incorporate goal setting into their daily lives to aide in recovery.    Participation Level:  Minimal  Participation Quality:  Appropriate  Affect:  Appropriate  Cognitive:  Appropriate  Insight: Appropriate  Engagement in Group:  Engaged  Modes of Intervention:  Discussion, Education, and Support  Additional Comments:    [name] 05/30/2023, 10:57 AM

## 2023-05-30 NOTE — Progress Notes (Signed)
Patient was supposed to discharge today and have a friend pick him up, but patient is still on the unit. He states the friend is coming  but he does not know when. Dr. Marlou Porch and social work made aware.

## 2023-05-30 NOTE — Progress Notes (Signed)
  [facility] Adult Case Management Discharge Plan :  Will you be returning to the same living situation after discharge:  Yes,  pt has declined shelter placement and states that he wants to return to [place] despite lack of emergency shelter. At discharge, do you have transportation home?: Yes,  pt support network to assist with transportation. Do you have the ability to pay for your medications: Yes,   Medicaid Prepaid Health plan.   Release of information consent forms completed and in the chart;  Patient's signature needed at discharge.  Patient to Follow up at:  Follow-up Information     [facility] Follow up.   Why: Your appointment is scheduled for Tuesday, 06/05/23 at 10AM. Thanks! Contact information: [name] [place] [address] [phone]                 Next level of care provider has access to [facility] Link:no  Safety Planning and Suicide Prevention discussed: Yes,  SPE was completed with brother, Kymere Kliewer.     Has patient been referred to the Quitline?: Patient refused referral for treatment  Patient has been referred for addiction treatment: Patient refused referral for treatment.  Glenis Smoker, LCSW 05/30/2023, 1:45 PM

## 2023-05-30 NOTE — Progress Notes (Signed)
Patient was resting in bed @ the start of this shift.  When this writer went to introduce myself the patient would not respond.  After snack & medication the patient briefly spoke with this Clinical research associate.  He lifted his head and smiled during our conversation.  He denied SI, HI, hallucinations, and delusions.  He was seen ambulating in the hall with his head down and on wearing one nonskid sock.  This Clinical research associate reminded him the importance of wearing both socks.  Medication compliant.  No s/sx of AIMs.  He rested the remainder of the shift in bed.  Respirations even & unlabored.  Q19m safety checks remain active as per protocol.

## 2023-05-30 NOTE — Group Note (Signed)
Recreation Therapy Group Note   Group Topic:Relaxation  Group Date: 05/30/2023 Start Time: 1000 End Time: 1050 Facilitators: Rosina Lowenstein, LRT, CTRS Location:  Craft room   Group Description: PMR (Progressive Muscle Relaxation). LRT asks patients their current level of stress/anxiety from 1-10, with 10 being the highest. LRT educates patients on what PMR is and the benefits that come from it. Patients are asked to sit with their feet flat on the floor while sitting up and all the way back in their chair, if possible. LRT and pts follow a prompt through a speaker that requires you to tense and release different muscles in their body and focus on their breathing. During session, lights are off and soft music is being played. At the end of the prompt, LRT asks patients to rank their current levels of stress/anxiety from 1-10, 10 being the highest.   Goal Area(s) Addressed:  Patients will be able to describe progressive muscle relaxation.  Patient will practice using relaxation technique. Patient will identify a new coping skill.  Patient will follow multistep directions to reduce anxiety and stress.  Affect/Mood: Blunted   Participation Level: Minimal   Participation Quality: Independent   Behavior: Calm   Speech/Thought Process: Coherent   Insight: Fair   Judgement: Fair    Modes of Intervention: Activity   Patient Response to Interventions:  Receptive   Education Outcome:  Acknowledges education   Clinical Observations/Individualized Feedback: [name] was minimally active in their participation of session activities and group discussion. Pt identified that his anxiety and stress was a 10 before the session. Afterwards, he rated his anxiety a 7 and his stress a 5. Pt interacted well with LRT and peers duration of session.   Plan: Continue to engage patient in RT group sessions 2-3x/week.   Rosina Lowenstein, LRT, CTRS 05/30/2023 11:02 AM

## 2023-05-30 NOTE — Progress Notes (Signed)
D: Patient alert and oriented. Affect/mood reported as improving. Denies SI, HI, AVH, and pain. Patient goal, "to have a discharge plan."  A: Scheduled medication administered to patient, per MD orders. Support and encouragement provided. Routine safety checks conducted every 15 minutes. Patient informed to notify staff with problems or concerns.   R: No adverse drug reactions noted. Patient contracts for safety at this time. Patient compliant with medications and treatment plan. Patient receptive, calm and cooperative. Patient interacts well with others on unit. Patient remains safe at this time.

## 2023-05-30 NOTE — Group Note (Signed)
Date:  05/30/2023 Time:  5:20 PM  Group Topic/Focus:  Activity/Outdoor Recreation    Participation Level:  Minimal  Participation Quality:  Appropriate  Affect:  Appropriate  Cognitive:  Appropriate  Insight: Appropriate  Engagement in Group:  Engaged  Modes of Intervention:  Activity  Additional Comments:    Lynelle Smoke Jayquan Bradsher 05/30/2023, 5:20 PM

## 2023-05-30 NOTE — BH IP Treatment Plan (Unsigned)
Interdisciplinary Treatment and Diagnostic Plan Update  05/30/2023 Time of Session: 08:30 Angel [name] MRN: [id number]  Principal Diagnosis: MDD (major depressive disorder), recurrent severe, without psychosis (HCC)  Secondary Diagnoses: Principal Problem:   MDD (major depressive disorder), recurrent severe, without psychosis (HCC)   Current Medications:  Current Facility-Administered Medications  Medication Dose Route Frequency Provider Last Rate Last Admin   acetaminophen (TYLENOL) tablet 650 mg  650 mg Oral Q4H PRN Lenard Lance, FNP       diphenhydrAMINE (BENADRYL) capsule 50 mg  50 mg Oral TID PRN Lenard Lance, FNP       Or   diphenhydrAMINE (BENADRYL) injection 50 mg  50 mg Intramuscular TID PRN Lenard Lance, FNP       feeding supplement (ENSURE ENLIVE / ENSURE PLUS) liquid 237 mL  237 mL Oral TID BM Reggie Pile, MD   237 mL at 05/29/23 2207   FLUoxetine (PROZAC) capsule 40 mg  40 mg Oral Daily Lenard Lance, FNP   40 mg at 05/30/23 0816   haloperidol (HALDOL) tablet 5 mg  5 mg Oral TID PRN Lenard Lance, FNP       Or   haloperidol lactate (HALDOL) injection 5 mg  5 mg Intramuscular TID PRN Lenard Lance, FNP       hydrOXYzine (ATARAX) tablet 25 mg  25 mg Oral TID PRN Lenard Lance, FNP   25 mg at 05/29/23 0819   LORazepam (ATIVAN) tablet 2 mg  2 mg Oral TID PRN Lenard Lance, FNP   2 mg at 05/29/23 1620   Or   LORazepam (ATIVAN) injection 2 mg  2 mg Intramuscular TID PRN Lenard Lance, FNP       multivitamin with minerals tablet 1 tablet  1 tablet Oral Daily Reggie Pile, MD   1 tablet at 05/30/23 0816   nicotine (NICODERM CQ - dosed in mg/24 hours) patch 14 mg  14 mg Transdermal Daily Lenard Lance, FNP   14 mg at 05/30/23 0817   OLANZapine (ZYPREXA) tablet 10 mg  10 mg Oral QHS Sarina Ill, DO   10 mg at 05/29/23 2208   pantoprazole (PROTONIX) EC tablet 40 mg  40 mg Oral Daily Lenard Lance, FNP   40 mg at 05/30/23 0816   traZODone (DESYREL) tablet 50 mg  50  mg Oral QHS PRN Lenard Lance, FNP   50 mg at 05/28/23 2101   PTA Medications: Medications Prior to Admission  Medication Sig Dispense Refill Last Dose   FLUoxetine (PROZAC) 40 MG capsule Take 1 capsule (40 mg total) by mouth daily. 30 capsule 0    hydrOXYzine (ATARAX) 25 MG tablet Take 1 tablet (25 mg total) by mouth 3 (three) times daily as needed for anxiety or vomiting (sleep, anger). 30 tablet 0    mirtazapine (REMERON) 7.5 MG tablet Take 1 tablet (7.5 mg total) by mouth at bedtime. 30 tablet 0    nicotine (NICODERM CQ - DOSED IN MG/24 HOURS) 14 mg/24hr patch Place 1 patch (14 mg total) onto the skin daily. 28 patch 0    pantoprazole (PROTONIX) 20 MG tablet Take 1 tablet (20 mg total) by mouth daily. 30 tablet 0    [DISCONTINUED] traZODone (DESYREL) 50 MG tablet Take 1 tablet (50 mg total) by mouth at bedtime as needed for sleep. 30 tablet 0     Patient Stressors: Financial difficulties   Loss of relationships   Marital or family conflict  Medication change or noncompliance   Occupational concerns   Traumatic event    Patient Strengths: Ability for insight  Communication skills  General fund of knowledge   Treatment Modalities: Medication Management, Group therapy, Case management,  1 to 1 session with clinician, Psychoeducation, Recreational therapy.   Physician Treatment Plan for Primary Diagnosis: MDD (major depressive disorder), recurrent severe, without psychosis (HCC) Long Term Goal(s): Improvement in symptoms so as ready for discharge   Short Term Goals: Ability to verbalize feelings will improve Ability to identify and develop effective coping behaviors will improve  Medication Management: Evaluate patient's response, side effects, and tolerance of medication regimen.  Therapeutic Interventions: 1 to 1 sessions, Unit Group sessions and Medication administration.  Evaluation of Outcomes: Adequate for Discharge  Physician Treatment Plan for Secondary Diagnosis:  Principal Problem:   MDD (major depressive disorder), recurrent severe, without psychosis (HCC)  Long Term Goal(s): Improvement in symptoms so as ready for discharge   Short Term Goals: Ability to verbalize feelings will improve Ability to identify and develop effective coping behaviors will improve     Medication Management: Evaluate patient's response, side effects, and tolerance of medication regimen.  Therapeutic Interventions: 1 to 1 sessions, Unit Group sessions and Medication administration.  Evaluation of Outcomes: Adequate for Discharge   RN Treatment Plan for Primary Diagnosis: MDD (major depressive disorder), recurrent severe, without psychosis (HCC) Long Term Goal(s): Knowledge of disease and therapeutic regimen to maintain health will improve  Short Term Goals: Ability to remain free from injury will improve, Ability to verbalize frustration and anger appropriately will improve, Ability to demonstrate self-control, Ability to participate in decision making will improve, Ability to verbalize feelings will improve, Ability to disclose and discuss suicidal ideas, Ability to identify and develop effective coping behaviors will improve, and Compliance with prescribed medications will improve  Medication Management: RN will administer medications as ordered by provider, will assess and evaluate patient's response and provide education to patient for prescribed medication. RN will report any adverse and/or side effects to prescribing provider.  Therapeutic Interventions: 1 on 1 counseling sessions, Psychoeducation, Medication administration, Evaluate responses to treatment, Monitor vital signs and CBGs as ordered, Perform/monitor CIWA, COWS, AIMS and Fall Risk screenings as ordered, Perform wound care treatments as ordered.  Evaluation of Outcomes: Adequate for Discharge   LCSW Treatment Plan for Primary Diagnosis: MDD (major depressive disorder), recurrent severe, without psychosis  (HCC) Long Term Goal(s): Safe transition to appropriate next level of care at discharge, Engage patient in therapeutic group addressing interpersonal concerns.  Short Term Goals: Engage patient in aftercare planning with referrals and resources, Increase social support, Increase ability to appropriately verbalize feelings, Increase emotional regulation, Facilitate acceptance of mental health diagnosis and concerns, Facilitate patient progression through stages of change regarding substance use diagnoses and concerns, Identify triggers associated with mental health/substance abuse issues, and Increase skills for wellness and recovery  Therapeutic Interventions: Assess for all discharge needs, 1 to 1 time with Social worker, Explore available resources and support systems, Assess for adequacy in community support network, Educate family and significant other(s) on suicide prevention, Complete Psychosocial Assessment, Interpersonal group therapy.  Evaluation of Outcomes: Adequate for Discharge   Progress in Treatment: Attending groups: Yes. and No. Participating in groups: No. Taking medication as prescribed: Yes. Toleration medication: Yes. Family/Significant other contact made: Yes, individual(s) contacted:  SPE completed with brother, Creedon Mactavish. Patient understands diagnosis: Yes. Discussing patient identified problems/goals with staff: Yes. Medical problems stabilized or resolved: Yes. Denies suicidal/homicidal ideation:  Yes. Issues/concerns per patient self-inventory: No. Other: none.  New problem(s) identified: No, Describe:  None Identified Update 05/29/23: No changes at this time.    New Short Term/Long Term Goal(s):elimination of symptoms of psychosis, medication management for mood stabilization; elimination of SI thoughts; development of comprehensive mental wellness plan. Update 05/29/23: No changes at this time.   Patient Goals:  "I don't know, find a place to live" Update  05/29/23: No changes at this time.   Discharge Plan or Barriers: CSW will assist pt with appropriate discharge plan" Update 05/29/23: Pt to discharge back to area that he is familiar with and continue with outpatient services through [facility].   Reason for Continuation of Hospitalization: Depression Suicidal ideation   Estimated Length of Stay: 1 to 7 days Update 05/29/23: No changes at this time.  Last 3 [place] Suicide Severity Risk Score: Flowsheet Row Admission (Current) from 05/23/2023 in [facility] INPAT[facility]ICINE Most recent reading at 05/23/2023  6:00 PM ED from 05/23/2023 in [facility] Emergency Department [facility] Most recent reading at 05/23/2023  8:45 AM [facility] Most recent reading at 04/28/2023  2:55 AM  C-SSRS RISK CATEGORY High Risk High Risk High Risk       Last PHQ 2/9 Scores:     No data to display          Scribe for Treatment Team: Glenis Smoker, LCSW 05/30/2023 1:42 PM

## 2023-05-31 MED ORDER — FLUOXETINE HCL 20 MG PO CAPS: 40.0000 mg | ORAL_CAPSULE | Freq: Every day | ORAL | 3 refills | Status: DC

## 2023-05-31 MED ORDER — TRAZODONE HCL 50 MG PO TABS: 50.0000 mg | ORAL_TABLET | Freq: Every evening | ORAL | 0 refills | Status: DC | PRN

## 2023-05-31 NOTE — Progress Notes (Signed)
Patient is a voluntary admission to BMU - calm, cooperative, states passive SI without plan and verbalizes safety. No HI or AVH. Patient is discharged to his brother's house via taxi - given all discharge information. Patient changed into clothes and ambulated with this nurse to the lobby and his awaiting cab.

## 2023-05-31 NOTE — Group Note (Signed)
Date:  05/31/2023 Time:  3:29 PM  Group Topic/Focus:  Community Group:  The focus of this group was to discuss with the patients on the quality of their stay and care so far and to get feedback on how we can improve our services to the patients.  We performed it outside so the patients could receive some fresh air as well.    Participation Level:  Active  Participation Quality:  Appropriate  Affect:  Appropriate  Cognitive:  Appropriate  Insight: Appropriate  Engagement in Group:  Engaged  Modes of Intervention:  Activity  Additional Comments:    Angel [name] [name] 05/31/2023, 3:29 PM

## 2023-05-31 NOTE — Progress Notes (Signed)
   05/30/23 2000  Psych Admission Type (Psych Patients Only)  Admission Status Voluntary  Psychosocial Assessment  Patient Complaints Anxiety;Hopelessness;Depression  Eye Contact Fair  Facial Expression Sullen  Affect Appropriate to circumstance  Speech Logical/coherent  Interaction Assertive  Motor Activity Pacing  Appearance/Hygiene Body odor  Behavior Characteristics Cooperative;Appropriate to situation  Mood Pleasant  Thought Process  Coherency Circumstantial  Content WDL  Delusions None reported or observed  Perception WDL  Hallucination None reported or observed  Judgment WDL  Confusion None  Danger to Self  Current suicidal ideation? Passive  Self-Injurious Behavior No self-injurious ideation or behavior indicators observed or expressed   Agreement Not to Harm Self Yes  Description of Agreement verbal  Danger to Others  Danger to Others None reported or observed   Patient complaint with medications continues to endorse Passive SI with no plan. Verbally contracts for safety. Q 15 minutes safety checks ongoing. Patient remains safe.

## 2023-05-31 NOTE — Group Note (Signed)
Date:  05/31/2023 Time:  10:09 AM  Group Topic/Focus:  Activity Group:  The focus of this group is to promote exercise for physical and mental wellbeing and the chance for the patients to get some fresh air.     Participation Level:  Did Not Attend  Participation Quality:  Appropriate  Affect:  Appropriate  Cognitive:  Appropriate  Insight: Appropriate  Engagement in Group:  Engaged  Modes of Intervention:  Activity  Additional Comments:    Angel [name] 05/31/2023, 10:09 AM

## 2023-05-31 NOTE — Progress Notes (Signed)
  [facility] Adult Case Management Discharge Plan :  Will you be returning to the same living situation after discharge:  No. He will be returning to his brother's house  At discharge, do you have transportation home?: Yes,  CSW will provide a taxi Do you have the ability to pay for your medications: Yes,  [place] MEDICAID PREPAID HEALTH PLAN / [place] MEDICAID [place] COMPLETE HEALTH  Release of information consent forms completed and in the chart;  Patient's signature needed at discharge.  Patient to Follow up at:  Follow-up Information     [facility] Follow up.   Why: Your appointment is scheduled for Tuesday, 06/05/23 at 10AM. Thanks! Contact information: [address] [place] [address] [phone]                 Next level of care provider has access to [facility] Link:no  Safety Planning and Suicide Prevention discussed: Yes,  it was discussed with his brother, Angel [name], [phone]     Has patient been referred to the Quitline?: Yes, faxed/e-referral on 05/31/23  Patient has been referred for addiction treatment: No known substance use disorder.  [address], LCSWA 05/31/2023, 3:19 PM

## 2023-05-31 NOTE — Progress Notes (Signed)
This provider met with the client earlier about his discharge plan since he was discharged two days ago.  Initially, he wanted a ride to [place] but transportation could not be obtained.  He is agreeable to go to the [facility] tomorrow when the busses are operational along with the [facility].  Cleared for discharge tomorrow.  Nanine Means, PMHNP

## 2023-05-31 NOTE — Group Note (Signed)
Recreation Therapy Group Note   Group Topic:Communication  Group Date: 05/31/2023 Start Time: 1000 End Time: 1050 Facilitators: Clinton Gallant, [place] Location:  Craft Room  Group Description: Fourth of July Games. LRT and patients played Patriotic Bingo, "Can you name three?", and 4th of July Trivia. Pt won prizes for [employer] of bingo. LRT prompted communication between peers. Patriotic music was playing in the background throughout group.   Goal Area(s) Addressed: Patient will increase communication skills by talking with LRT and peers.  Patient will increase frustration tolerance skills.  Affect/Mood: Appropriate   Participation Level: Moderate   Participation Quality: Independent   Behavior: Appropriate and Calm   Speech/Thought Process: Coherent   Insight: Good   Judgement: Fair    Modes of Intervention: Cooperative Play and Open Conversation   Patient Response to Interventions:  Attentive and Receptive   Education Outcome:  Acknowledges education   Clinical Observations/Individualized Feedback: Angel [name] was mostly active in their participation of session activities and group discussion. Pt shared that he has no holiday traditions, even as a child growing up. Pt appropriately played all games while minimally interacting with LRT and peers.    Plan: Continue to engage patient in RT group sessions 2-3x/week.   Rosina Lowenstein, LRT, CTRS 05/31/2023 11:23 AM

## 2023-06-03 ENCOUNTER — Emergency Department (HOSPITAL_COMMUNITY)
Admission: EM | Admit: 2023-06-03 | Discharge: 2023-06-04 | Disposition: A | Discharge status: Home / Self Care | Payer: Medicaid Other | Attending: Emergency Medicine | Admitting: Emergency Medicine

## 2023-06-03 ENCOUNTER — Other Ambulatory Visit: Payer: Self-pay

## 2023-06-03 ENCOUNTER — Encounter (HOSPITAL_COMMUNITY): Payer: Self-pay | Admitting: *Deleted

## 2023-06-03 DIAGNOSIS — Z1152 Encounter for screening for COVID-19: Secondary | ICD-10-CM | POA: Diagnosis not present

## 2023-06-03 DIAGNOSIS — F411 Generalized anxiety disorder: Secondary | ICD-10-CM | POA: Diagnosis present

## 2023-06-03 DIAGNOSIS — F332 Major depressive disorder, recurrent severe without psychotic features: Secondary | ICD-10-CM | POA: Diagnosis present

## 2023-06-03 DIAGNOSIS — R45851 Suicidal ideations: Secondary | ICD-10-CM | POA: Diagnosis not present

## 2023-06-03 DIAGNOSIS — W268XXA Contact with other sharp object(s), not elsewhere classified, initial encounter: Secondary | ICD-10-CM | POA: Insufficient documentation

## 2023-06-03 DIAGNOSIS — F32A Depression, unspecified: Secondary | ICD-10-CM | POA: Diagnosis present

## 2023-06-03 DIAGNOSIS — S51811A Laceration without foreign body of right forearm, initial encounter: Secondary | ICD-10-CM | POA: Insufficient documentation

## 2023-06-03 LAB — COMPREHENSIVE METABOLIC PANEL
ALT: 141 U/L — ABNORMAL HIGH (ref 0–44)
AST: 61 U/L — ABNORMAL HIGH (ref 15–41)
Albumin: 4.4 g/dL (ref 3.5–5.0)
Alkaline Phosphatase: 79 U/L (ref 38–126)
Anion gap: 15 (ref 5–15)
BUN: 18 mg/dL (ref 6–20)
CO2: 21 mmol/L — ABNORMAL LOW (ref 22–32)
Calcium: 9.1 mg/dL (ref 8.9–10.3)
Chloride: 98 mmol/L (ref 98–111)
Creatinine, Ser: 0.76 mg/dL (ref 0.61–1.24)
GFR, Estimated: >60 mL/min (ref >60)
Glucose, Bld: 79 mg/dL (ref 70–99)
Potassium: 3.7 mmol/L (ref 3.5–5.1)
Sodium: 134 mmol/L — ABNORMAL LOW (ref 135–145)
Total Bilirubin: 1.1 mg/dL (ref 0.3–1.2)
Total Protein: 7.3 g/dL (ref 6.5–8.1)

## 2023-06-03 LAB — ACETAMINOPHEN LEVEL: Acetaminophen (Tylenol), Serum: <10 ug/mL — ABNORMAL LOW (ref 10–30)

## 2023-06-03 LAB — SARS CORONAVIRUS 2 BY RT PCR: SARS Coronavirus 2 by RT PCR: NEGATIVE

## 2023-06-03 LAB — CBC WITH DIFFERENTIAL/PLATELET
Abs Immature Granulocytes: 0.03 10*3/uL (ref 0.00–0.07)
Basophils Absolute: 0 10*3/uL (ref 0.0–0.1)
Basophils Relative: 0 %
Eosinophils Absolute: 0 10*3/uL (ref 0.0–0.5)
Eosinophils Relative: 1 %
HCT: 42.2 % (ref 39.0–52.0)
Hemoglobin: 14.2 g/dL (ref 13.0–17.0)
Immature Granulocytes: 0 %
Lymphocytes Relative: 29 %
Lymphs Abs: 2.5 10*3/uL (ref 0.7–4.0)
MCH: 29.6 pg (ref 26.0–34.0)
MCHC: 33.6 g/dL (ref 30.0–36.0)
MCV: 87.9 fL (ref 80.0–100.0)
Monocytes Absolute: 0.7 10*3/uL (ref 0.1–1.0)
Monocytes Relative: 8 %
Neutro Abs: 5.6 10*3/uL (ref 1.7–7.7)
Neutrophils Relative %: 62 %
Platelets: 225 10*3/uL (ref 150–400)
RBC: 4.8 MIL/uL (ref 4.22–5.81)
RDW: 13.6 % (ref 11.5–15.5)
WBC: 8.9 10*3/uL (ref 4.0–10.5)
nRBC: 0 % (ref 0.0–0.2)

## 2023-06-03 LAB — ETHANOL: Alcohol, Ethyl (B): <10 mg/dL (ref <10)

## 2023-06-03 MED ORDER — TRAZODONE HCL 50 MG PO TABS: 50.0000 mg | ORAL_TABLET | Freq: Every evening | ORAL | Status: DC | PRN

## 2023-06-03 MED ORDER — OLANZAPINE 5 MG PO TABS
10.0000 mg | ORAL_TABLET | Freq: Every day | ORAL | Status: DC
  Administered 2023-06-03: 10 mg via ORAL
  Filled 2023-06-03: qty 2

## 2023-06-03 MED ORDER — HYDROXYZINE HCL 25 MG PO TABS
25.0000 mg | ORAL_TABLET | Freq: Once | ORAL | Status: AC
  Administered 2023-06-03: 25 mg via ORAL
  Filled 2023-06-03: qty 1

## 2023-06-03 MED ORDER — NICOTINE 21 MG/24HR TD PT24
21.0000 mg | MEDICATED_PATCH | Freq: Once | TRANSDERMAL | Status: AC
  Administered 2023-06-03: 21 mg via TRANSDERMAL
  Filled 2023-06-03: qty 1

## 2023-06-03 MED ORDER — FLUOXETINE HCL 20 MG PO CAPS
40.0000 mg | ORAL_CAPSULE | Freq: Every day | ORAL | Status: DC
  Administered 2023-06-03: 40 mg via ORAL
  Filled 2023-06-03: qty 2

## 2023-06-03 NOTE — ED Triage Notes (Addendum)
Pt came in with cuts to right forearm where pt cut self with a razor and several superficial cuts to bilateral forearms, pt states " I just want to die".  Pt admits to having SI for couple days, pt states he blacked out this morning. Denies any drug or ETOH use. Pt admits being homeless.

## 2023-06-03 NOTE — Progress Notes (Signed)
Patient has been denied by [facility] due to no appropriate beds available. Patient meets BH inpatient criteria per Joaquin Courts, NP. Patient has been faxed out to the following facilities:   [facility]  [address] [place] [place] [address] [phone]  [facility]  [address] [place] [address] [phone]  [facility]  [address] [place] [address] [phone]  [facility]  [address] [place] [address] [phone]  [facility]  [address] [place] [address] [phone]  [facility]  [address] [place] [address] [phone]  [facility]  [address] [place], [place] [address] [phone]  [facility]  [address] [place], [place] [address] [phone]  [facility]  [address] [place] [place] [address] [phone]  [facility]  [address] [place] [address] [phone]  [facility]  [address], [place] [address] [phone]  [facility]  [address] [place] [address] [phone]   Damita Dunnings, MSW, LCSW-A  4:08 PM 06/03/2023

## 2023-06-03 NOTE — ED Notes (Signed)
See triage notes. Old and new superficial cuts noted to ble. Small cut lightly oozing blood to right anterior wrist. Area cleaned and band aid applied. Pt very sad. Does not make eye contact but is polite and answers questions. States sometimes taking the meds [place] gave him but they are not working. Educated pt on meds and sometimes it takes a few weeks to start working. Meal given. States has not had shower since Friday when d/c from . Shower offered after eats meal.

## 2023-06-03 NOTE — Progress Notes (Signed)
[facility] LCSW Progress Note   06/03/2023    4:17 PM  Angel [name]   [id number]   Type of Contact and Topic:  Psychiatric Bed Placement   Pt accepted to [facility] [place]      Patient meets inpatient criteria per Joaquin Courts, NP  The attending provider will be Dr. Elby Showers   Call report to [phone]  Enid Derry, RN @ [facility] ED notified.     Pt scheduled  to arrive at [facility] after 0900.   Damita Dunnings, MSW, LCSW-A  4:18 PM 06/03/2023

## 2023-06-03 NOTE — ED Provider Notes (Addendum)
[place] EMERGENCY DEPARTMENT AT [facility] Provider Note   CSN: [id number] Arrival date & time: 06/03/23  1057     History  Chief Complaint  Patient presents with   Suicidal    Angel [name] is a 42 y.o. male.  HPI  Patient with depression.  Suicidal thoughts.  States he wants to die.  Has superficial cut on his right forearm.  Does have a history of cutting for stress relief.  Is homeless.  Recently got out of  behavioral health this last week for similar symptoms.  States he has not been taking the medicines.    Past Medical History:  Diagnosis Date   Anxiety    Depression    History of admission to inpatient psychiatry department 05/03/2023   Migraine    Suicidal ideation 05/03/2023    Home Medications Prior to Admission medications   Medication Sig Start Date End Date Taking? Authorizing Provider  FLUoxetine (PROZAC) 20 MG capsule Take 2 capsules (40 mg total) by mouth daily. 05/31/23 05/30/24  Charm Rings, NP  OLANZapine (ZYPREXA) 10 MG tablet Take 1 tablet (10 mg total) by mouth at bedtime. 05/29/23   Sarina Ill, DO  traZODone (DESYREL) 50 MG tablet Take 1 tablet (50 mg total) by mouth at bedtime. 05/29/23 06/28/23  Sarina Ill, DO  traZODone (DESYREL) 50 MG tablet Take 1 tablet (50 mg total) by mouth at bedtime as needed for sleep. 05/31/23 06/30/23  Charm Rings, NP      Allergies    Patient has no known allergies.    Review of Systems   Review of Systems  Physical Exam Updated Vital Signs BP 117/78 (BP Location: Left Arm)   Pulse 89   Temp 97.8 F (36.6 C) (Oral)   Resp 18   Ht 5\' 8"  (1.727 m)   Wt 67.1 kg   SpO2 97%   BMI 22.50 kg/m  Physical Exam Vitals and nursing note reviewed.  HENT:     Head: Normocephalic.  Cardiovascular:     Rate and Rhythm: Normal rate.  Pulmonary:     Breath sounds: No wheezing.  Musculoskeletal:     Cervical back: Neck supple.  Skin:    Comments: Multiple scars on 4 arms  from previous cutting.  On the right forearm there is a superficial vertical laceration down the anterior aspect of the forearm.  It does not go all the way through the skin.  Neurological:     Mental Status: He is alert.  Psychiatric:     Comments: Somewhat depressed mood.     ED Results / Procedures / Treatments   Labs (all labs ordered are listed, but only abnormal results are displayed) Labs Reviewed  SARS CORONAVIRUS 2 BY RT PCR  COMPREHENSIVE METABOLIC PANEL  ETHANOL  RAPID URINE DRUG SCREEN, HOSP PERFORMED  CBC WITH DIFFERENTIAL/PLATELET    EKG None  Radiology No results found.  Procedures Procedures    Medications Ordered in ED Medications  FLUoxetine (PROZAC) capsule 40 mg (has no administration in time range)  OLANZapine (ZYPREXA) tablet 10 mg (has no administration in time range)  traZODone (DESYREL) tablet 50 mg (has no administration in time range)    ED Course/ Medical Decision Making/ A&P                             Medical Decision Making Amount and/or Complexity of Data Reviewed Labs: ordered.  Risk  Prescription drug management.   Patient with depression and suicidal thoughts.  Forearm laceration that patient states was for a suicide attempt but appears more to be some of his cutting.  Does not appear to need suturing.  Reviewed recent discharge note from behavioral health and recent blood work.  At this point patient is medically cleared but will check blood work and COVID test for placement.  Started on home meds.  LFTs just slightly elevated.  Had been mildly elevated prior.  Will add acetaminophen at this time.   Acetaminophen negative.  Patient is medically cleared.     Patient is been seen by psychiatry and inpatient treatment recommended.  However think they will not be able to place him tonight and will reevaluate tomorrow if he is still here.  Continue home medicines.  Reportedly had not been taking them due to cost  issues.     Final Clinical Impression(s) / ED Diagnoses Final diagnoses:  Suicidal ideation    Rx / DC Orders ED Discharge Orders     None         Benjiman Core, MD 06/03/23 1339    Benjiman Core, MD 06/03/23 1341

## 2023-06-03 NOTE — BH Assessment (Addendum)
Comprehensive Clinical Assessment (CCA) Note  06/03/2023 Angel [name] Doctor [id number]  Chief Complaint:  Chief Complaint  Patient presents with   Suicidal   Visit Diagnosis:  MDD, recurrent Suicidal ideation  Disposition: Per Joaquin Courts NP pt recommended inpatient hospitalization.    Angel [name] is a 42 yo male presenting to [facility] for evaluation of ongoing suicidal ideation with an attempt earlier today to cut his wrists. Pt states he used a razor blade to harm himself. Pt states that he recently had a psychiatric inpatient hospitalization at [facility] and was not happy when discharged. Pt states that he was given Prozac, vistaril, and Remeron but was not given medication or instructions on how to get his medication.  Pt admits that he has not taken any medication since most recent discharge (7/4) because he didn't have the medication. Pt endorses SI, denies HI or AVH. Pt denies any current substance use. Pt reports that he is currently homeless and has limited supports.  Pt states that he is feeling depressed, thoughts of worthlessness, thoughts of hopelessness, increased agitation, and is currently experiencing insomnia. Pt feels he is currently a danger to himself.  Flowsheet Row ED from 06/03/2023 in [facility] Emergency Department at [facility] Most recent reading at 06/03/2023 11:04 AM Admission (Discharged) from 05/23/2023 in [facility] INPATIENT BEHAVIORAL MEDICINE Most recent reading at 05/23/2023  6:00 PM ED from 05/23/2023 in [facility] Emergency Department at [facility] Most recent reading at 05/23/2023  8:45 AM  C-SSRS RISK CATEGORY High Risk High Risk High Risk       The patient demonstrates the following risk factors for suicide: Chronic risk factors for suicide include: psychiatric disorder of depression, previous suicide attempts multiple previous attempts, previous self-harm pt has scarring on arm from previous self injurious behaviors, and completed suicide in a family  member. Acute risk factors for suicide include: family or marital conflict, social withdrawal/isolation, and recent discharge from inpatient psychiatry. Protective factors for this patient include:  n/a . Considering these factors, the overall suicide risk at this point appears to be high. Patient is not appropriate for outpatient follow up.   CCA Screening, Triage and Referral (STR)  Patient Reported Information How did you hear about [place]? Other (Comment) [facility])  What Is the Reason for Your Visit/Call Today? Angel [name] is a 42 yo male presenting to [facility] for evaluation of ongoing suicidal ideation with an attempt earlier today to cut his wrists. Pt states he used a razor blade to harm himself. Pt states that he recently had a psychiatric inpatient hospitalization at [facility] and was not happy when discharged. Pt states that he was given Prozac, vistaril, and Remeron but was not given medication or instructions on how to get his medication.  Pt admits that he has not taken any medication since most recent discharge (7/4) because he didn't have the medication. Pt endorses SI, denies HI or AVH. Pt denies any current substance use. Pt reports that he is currently homeless and has limited supports.  Pt states that he is feeling depressed, thoughts of worthlessness, thoughts of hopelessness, increased agitation, and is currently experiencing insomnia. Pt feels he is currently a danger to himself.  How Long Has This Been Causing You Problems? <Week  What Do You Feel Would Help You the Most Today? Treatment for Depression or other mood problem; Food Assistance; Medication(s); Housing Assistance   Have You Recently Had Any Thoughts About Hurting Yourself? Yes  Are You Planning to Commit Suicide/Harm Yourself At This  time? Yes   Flowsheet Row ED from 06/03/2023 in [facility] Emergency Department at [facility] Most recent reading at 06/03/2023 11:04 AM Admission (Discharged) from 05/23/2023 in [facility]  INPATIENT BEHAVIORAL MEDICINE Most recent reading at 05/23/2023  6:00 PM ED from 05/23/2023 in [facility] Emergency Department at [facility] Most recent reading at 05/23/2023  8:45 AM  C-SSRS RISK CATEGORY High Risk High Risk High Risk       Have you Recently Had Thoughts About Hurting Someone Karolee Ohs? No  Are You Planning to Harm Someone at This Time? No  Explanation: n/a   Have You Used Any Alcohol or Drugs in the Past 24 Hours? No  What Did You Use and How Much? pt denies substance use currently   Do You Currently Have a Therapist/Psychiatrist? Yes  Name of Therapist/Psychiatrist: Name of Therapist/Psychiatrist: [facility]   Have You Been Recently Discharged From Any Office Practice or Programs? Yes  Explanation of Discharge From Practice/Program: Pt was recently discharged from [facility] for psychiatric admission. Dates of admission 05/23/23 through 05/31/23.     CCA Screening Triage Referral Assessment Type of Contact: Tele-Assessment  Telemedicine Service Delivery: Telemedicine service delivery: This service was provided via telemedicine using a 2-way, interactive audio and video technology  Is this Initial or Reassessment? Is this Initial or Reassessment?: Initial Assessment  Date Telepsych consult ordered in [facility]:  Date Telepsych consult ordered in [facility]: 06/03/23  Time Telepsych consult ordered in [facility]:  Time Telepsych consult ordered in [facility]: 1111  Location of Assessment: [facility] ED  Provider Location: [facility] Assessment Services   Collateral Involvement: Epic review   Does Patient Have a Automotive engineer Guardian? No  Legal Guardian Contact Information: Pt does not have a legal guardian  Copy of Legal Guardianship Form: No - copy requested  Legal Guardian Notified of Arrival: -- (Pt does not have a legal guardian)  Legal Guardian Notified of Pending Discharge: -- (Pt does not have a legal guardian)  If Minor and Not Living with Parent(s), Who has Custody?  n/a  Is CPS involved or ever been involved? Never  Is APS involved or ever been involved? Never   Patient Determined To Be At Risk for Harm To Self or Others Based on Review of Patient Reported Information or Presenting Complaint? Yes, for Self-Harm (Pt states that he is currently a danger to himself)  Method: Plan with intent and identified person (self)  Availability of Means: In hand or used (pt cut self with razor blade)  Intent: Clearly intends on inflicting harm that could cause death  Notification Required: No need or identified person  Additional Information for Danger to Others Potential: Previous attempts  Additional Comments for Danger to Others Potential: None noted  Are There Guns or Other Weapons in Your Home? No  Types of Guns/Weapons: NA  Are These Weapons Safely Secured?                            No (no weapons)  Who Could Verify You Are Able To Have These Secured: n/a--no weapons  Do You Have any Outstanding Charges, Pending Court Dates, Parole/Probation? none  Contacted To Inform of Risk of Harm To Self or Others: Other: Comment    Does Patient Present under Involuntary Commitment? No    [place] of Residence: [place]   Patient Currently Receiving the Following Services: Medication Management (Pt states [facility])   Determination of Need: Emergent (2 hours)  Options For Referral: Inpatient Hospitalization; Medication Management     CCA Biopsychosocial Patient Reported Schizophrenia/Schizoaffective Diagnosis in Past: No   Strengths: Pt is willing to get help--both outpatient and inpatient   Mental Health Symptoms Depression:   Change in energy/activity; Difficulty Concentrating; Fatigue; Hopelessness; Increase/decrease in appetite; Irritability; Sleep (too much or little); Tearfulness; Weight gain/loss; Worthlessness (pt reports insomnia)   Duration of Depressive symptoms:  Duration of Depressive Symptoms: Greater than two weeks    Mania:   Racing thoughts   Anxiety:    Worrying; Difficulty concentrating; Fatigue; Irritability; Restlessness; Sleep   Psychosis:   None   Duration of Psychotic symptoms:    Trauma:   Irritability/anger; Re-experience of traumatic event (nightmares at times)   Obsessions:   None   Compulsions:   None   Inattention:   None   Hyperactivity/Impulsivity:   None   Oppositional/Defiant Behaviors:   None   Emotional Irregularity:   Chronic feelings of emptiness; Recurrent suicidal behaviors/gestures/threats   Other Mood/Personality Symptoms:   None noted    Mental Status Exam Appearance and self-care  Stature:   Average   Weight:   Average weight   Clothing:   Disheveled   Grooming:   Neglected   Cosmetic use:   None   Posture/gait:   Normal   Motor activity:   Not Remarkable   Sensorium  Attention:   Normal   Concentration:   Normal   Orientation:   X5   Recall/memory:   Normal   Affect and Mood  Affect:   Depressed   Mood:   Depressed   Relating  Eye contact:   Normal   Facial expression:   Depressed   Attitude toward examiner:   Cooperative   Thought and Language  Speech flow:  Clear and Coherent   Thought content:   Appropriate to Mood and Circumstances   Preoccupation:   None   Hallucinations:   None   Organization:   Coherent; Engineer, site of Knowledge:   Average   Intelligence:   Average   Abstraction:   Normal   Judgement:   Dangerous   Reality Testing:   Realistic   Insight:   Gaps   Decision Making:   Impulsive   Social Functioning  Social Maturity:   Impulsive   Social Judgement:   Heedless   Stress  Stressors:   Housing; Family conflict   Coping Ability:   Overwhelmed; Exhausted   Skill Deficits:   Self-control; Interpersonal; Decision making; Activities of daily living   Supports:   Support needed      Religion: Religion/Spirituality Are You A Religious Person?: No How Might This Affect Treatment?: NA  Leisure/Recreation: Leisure / Recreation Do You Have Hobbies?: Yes Leisure and Hobbies: From previous CCA: "Playing video games and drawing."  Exercise/Diet: Exercise/Diet Do You Exercise?: No Have You Gained or Lost A Significant Amount of Weight in the Past Six Months?: Yes-Lost Number of Pounds Lost?: 30 Do You Follow a Special Diet?: No Do You Have Any Trouble Sleeping?: Yes Explanation of Sleeping Difficulties: Pt reports insomnia--poor quality and quantity of sleep (inconsistent)   CCA Employment/Education Employment/Work Situation: Employment / Work Situation Employment Situation: Unemployed Patient's Job has Been Impacted by Current Illness: No Has Patient ever Been in Equities trader?: No  Education: Education Is Patient Currently Attending School?: No Last Grade Completed: 12 Did You Product manager?: No Did You Have An Individualized Education Program (IIEP): No Did You Have  Any Difficulty At School?: No Patient's Education Has Been Impacted by Current Illness: No   CCA Family/Childhood History Family and Relationship History: Family history Marital status: Single Does patient have children?: No  Childhood History:  Childhood History By whom was/is the patient raised?: Both parents Description of patient's current relationship with siblings: Younger brother hung himself a few years ago. Pt shares that his older brother has health issues. Did patient suffer any verbal/emotional/physical/sexual abuse as a child?: No Did patient suffer from severe childhood neglect?: No Has patient ever been sexually abused/assaulted/raped as an adolescent or adult?: No Was the patient ever a victim of a crime or a disaster?: No Witnessed domestic violence?: No Has patient been affected by domestic violence as an adult?: No       CCA Substance Use Alcohol/Drug  Use: Alcohol / Drug Use Pain Medications: SEE MAR Prescriptions: SEE MAR Over the Counter: SEE MAR History of alcohol / drug use?: Yes Longest period of sobriety (when/how long): Previous history of THC use Negative Consequences of Use:  (none) Withdrawal Symptoms: None Substance #1 Name of Substance 1: THC 1 - Amount (size/oz): variable 1 - Frequency: regularly 1 - Duration: ongoing 1 - Last Use / Amount: 2 weeks 1 - Method of Aquiring: street 1- Route of Use: oral/smoke                       ASAM's:  Six Dimensions of Multidimensional Assessment  Dimension 1:  Acute Intoxication and/or Withdrawal Potential:   Dimension 1:  Description of individual's past and current experiences of substance use and withdrawal: pt denies  Dimension 2:  Biomedical Conditions and Complications:   Dimension 2:  Description of patient's biomedical conditions and  complications: pt denies  Dimension 3:  Emotional, Behavioral, or Cognitive Conditions and Complications:  Dimension 3:  Description of emotional, behavioral, or cognitive conditions and complications: pt denies  Dimension 4:  Readiness to Change:  Dimension 4:  Description of Readiness to Change criteria: pt denies  Dimension 5:  Relapse, Continued use, or Continued Problem Potential:  Dimension 5:  Relapse, continued use, or continued problem potential critiera description: pt denies  Dimension 6:  Recovery/Living Environment:  Dimension 6:  Recovery/Iiving environment criteria description: pt denies  ASAM Severity Score: ASAM's Severity Rating Score: 0  ASAM Recommended Level of Treatment:     Substance use Disorder (SUD)    Recommendations for Services/Supports/Treatments:    Discharge Disposition: Discharge Disposition Medical Exam completed: Yes Disposition of Patient: Admit (Per Joaquin Courts NP pt meets inpatient criteria)  DSM5 Diagnoses: Patient Active Problem List   Diagnosis Date Noted   Suicidal ideation  05/03/2023   GERD (gastroesophageal reflux disease) 03/11/2018   Tobacco use disorder 07/17/2015   Generalized anxiety disorder 11/01/2013   MDD (major depressive disorder), recurrent severe, without psychosis (HCC) 10/31/2013     Referrals to Alternative Service(s): Referred to Alternative Service(s):   Place:   Date:   Time:    Referred to Alternative Service(s):   Place:   Date:   Time:    Referred to Alternative Service(s):   Place:   Date:   Time:    Referred to Alternative Service(s):   Place:   Date:   Time:     Ernest Haber Jahlani Lorentz, LCSW

## 2023-06-03 NOTE — ED Notes (Signed)
Patient to shower room with sitter present. Provided all supplies to shower, clean hospital approved attire/socks, and toothpaste/toothbrush. Bed linen changed

## 2023-06-03 NOTE — Consult Note (Signed)
Telepsych Consultation   Reason for Consult:  Psychiatric Consult  Referring Physician:   Location of Patient:  Location of Provider: Other: [facility]   Patient Identification: Angel [name] MRN:  [id number] Principal Diagnosis: Suicidal ideation Diagnosis:  Principal Problem:   Suicidal ideation Active Problems:   Generalized anxiety disorder   MDD (major depressive disorder), recurrent severe, without psychosis (HCC)   Total Time spent with patient: 45 minutes  Subjective:   Angel [name] is a 42 y.o. male history of suicidal ideations, recurrent MDD, homelessness, and history polysubstance use, presented to the [facility] today with complaints of worsening SI with a plan to slit his wrist.   HPI:   Angel [name], 42 y.o., male patient seen via tele health by this provider, consulted with Dr. Jannifer Franklin ; and chart reviewed on 06/03/23.  On evaluation Angel [name] reports that he was discharged from [facility] on 05/31/2023 and was unable to pick up his medications due to cost.  Patient was discharged from [facility] behavioral health inpatient hospital prescribed Olanzapine 10 mg once daily, Fluoxetine 40 mg once daily, and trazodone 50 mg at bedtime as needed for sleep.  He reports he began experiencing suicidal thoughts the day after he was discharged from the hospital. Per [facility] discharge encounter documentation, patient was discharged to [facility] however, was able to find transportation back to [place]. He is tearful and reports "I am tired of living and don't want to be here no more". He reports attempting to cut his wrist, according to EDP the cuts to the wrist are superficial. He reports having an appointment scheduled at [facility], The next week, however doesn't have transportation to the appointment. He is homeless, and was living with his sister up until one month and she made him leave her home. Reports having no resources or financial means all of which is  contributing to worsening depression.  Patient reports that he is unable to contract for safety if he were to be discharged.  This Clinical research associate asked patient if his last 2 recent psychiatric admissions were ineffective in helping him cope with his depression and recurrent suicidal ideations how to [facility] a third admission would be beneficial.  He begins to cry and tells this Clinical research associate he does not know he just does not know "what to do".   During evaluation Angel [name] is sitting upright on the stretcher in no acute distress.  He is alert, oriented x 4, anxious, remains cooperative.  His mood is depressed, anxious, with congruent affect. He does not appear to be responding to internal/external stimuli or delusional thoughts.  Patient voices active suicidal ideations with plan to cut his wrist.  Patient endorses previous suicidal attempt, which was documented back in 2019 when patient presented to the ED with superficial lacerations to his wrist.  Patient denies any active homicidal ideations however is unable to contract for safety due to ongoing suicidal ideations.  Patient needs inpatient psychiatric treatment criteria however will have psychiatry reevaluate patient tomorrow given he has had 2 recent hospitalizations in a psychiatric facility in less than 60 days, there is some suspicion that patient may be malingering given his current living circumstances of being homeless.   Past Psychiatric History: See HPI   Risk to Self:  Yes   Risk to Others:  No  Prior Inpatient Therapy:  Two recent inpatient psychiatric admissions: 04/28/2023  Prior Outpatient Therapy:  New Patient Appointment Scheduled with [facility]   Past Medical History:  Past Medical History:  Diagnosis Date   Anxiety    Depression    History of admission to inpatient psychiatry department 05/03/2023   Migraine    Suicidal ideation 05/03/2023   History reviewed. No pertinent surgical history. Family History:  Family History  Problem  Relation Age of Onset   Cancer Mother        brain   Diabetes Brother    Hypertension Brother    Family Psychiatric  History:  Family History  Problem Relation Age of Onset   Cancer Mother        brain   Diabetes Brother    Hypertension Brother     Social History:  Social History   Substance and Sexual Activity  Alcohol Use No     Social History   Substance and Sexual Activity  Drug Use Yes   Types: Marijuana   Comment: denies use in the last month    Social History   Socioeconomic History   Marital status: Single    Spouse name: Not on file   Number of children: Not on file   Years of education: Not on file   Highest education level: Not on file  Occupational History   Not on file  Tobacco Use   Smoking status: Every Day    Packs/day: 1.50    Years: 20.00    Additional pack years: 0.00    Total pack years: 30.00    Types: Cigarettes   Smokeless tobacco: Never  Vaping Use   Vaping Use: Never used  Substance and Sexual Activity   Alcohol use: No   Drug use: Yes    Types: Marijuana    Comment: denies use in the last month   Sexual activity: Not Currently    Birth control/protection: Condom  Other Topics Concern   Not on file  Social History Narrative   Not on file   Social Determinants of Health   Financial Resource Strain: Not on file  Food Insecurity: Food Insecurity Present (05/23/2023)   Hunger Vital Sign    Worried About Running Out of Food in the Last Year: Often true    Ran Out of Food in the Last Year: Often true  Transportation Needs: Unmet Transportation Needs (05/23/2023)   PRAPARE - Administrator, Civil Service (Medical): No    Lack of Transportation (Non-Medical): Yes  Physical Activity: Not on file  Stress: Not on file  Social Connections: Not on file   Additional Social History:    Allergies:  No Known Allergies  Labs:  Results for orders placed or performed during the hospital encounter of 06/03/23 (from the past  48 hour(s))  Comprehensive metabolic panel     Status: Abnormal   Collection Time: 06/03/23 11:44 AM  Result Value Ref Range   Sodium 134 (L) 135 - 145 mmol/L   Potassium 3.7 3.5 - 5.1 mmol/L   Chloride 98 98 - 111 mmol/L   CO2 21 (L) 22 - 32 mmol/L   Glucose, Bld 79 70 - 99 mg/dL    Comment: Glucose reference range applies only to samples taken after fasting for at least 8 hours.   BUN 18 6 - 20 mg/dL   Creatinine, Ser 0.45 0.61 - 1.24 mg/dL   Calcium 9.1 8.9 - 40.9 mg/dL   Total Protein 7.3 6.5 - 8.1 g/dL   Albumin 4.4 3.5 - 5.0 g/dL   AST 61 (H) 15 - 41 U/L   ALT 141 (H) 0 -  44 U/L   Alkaline Phosphatase 79 38 - 126 U/L   Total Bilirubin 1.1 0.3 - 1.2 mg/dL   GFR, Estimated >16 >10 mL/min    Comment: (NOTE) Calculated using the CKD-EPI Creatinine Equation (2021)    Anion gap 15 5 - 15    Comment: Performed at [facility], [address], [place], [place] [address]  Ethanol     Status: None   Collection Time: 06/03/23 11:44 AM  Result Value Ref Range   Alcohol, Ethyl (B) <10 <10 mg/dL    Comment: (NOTE) Lowest detectable limit for serum alcohol is 10 mg/dL.  For medical purposes only. Performed at [facility], [address], [place], [place] [address]   CBC with Diff     Status: None   Collection Time: 06/03/23 11:44 AM  Result Value Ref Range   WBC 8.9 4.0 - 10.5 K/uL   RBC 4.80 4.22 - 5.81 MIL/uL   Hemoglobin 14.2 13.0 - 17.0 g/dL   HCT 19.1 47.8 - 29.5 %   MCV 87.9 80.0 - 100.0 fL   MCH 29.6 26.0 - 34.0 pg   MCHC 33.6 30.0 - 36.0 g/dL   RDW 62.1 30.8 - 65.7 %   Platelets 225 150 - 400 K/uL   nRBC 0.0 0.0 - 0.2 %   Neutrophils Relative % 62 %   Neutro Abs 5.6 1.7 - 7.7 K/uL   Lymphocytes Relative 29 %   Lymphs Abs 2.5 0.7 - 4.0 K/uL   Monocytes Relative 8 %   Monocytes Absolute 0.7 0.1 - 1.0 K/uL   Eosinophils Relative 1 %   Eosinophils Absolute 0.0 0.0 - 0.5 K/uL   Basophils Relative 0 %   Basophils Absolute 0.0 0.0 - 0.1 K/uL   Immature Granulocytes 0  %   Abs Immature Granulocytes 0.03 0.00 - 0.07 K/uL    Comment: Performed at [facility], [address], [place], [place] [address]  SARS Coronavirus 2 by RT PCR (hospital order, performed in [facility] hospital lab) *cepheid single result test* Anterior Nasal Swab     Status: None   Collection Time: 06/03/23 11:45 AM   Specimen: Anterior Nasal Swab  Result Value Ref Range   SARS Coronavirus 2 by RT PCR NEGATIVE NEGATIVE    Comment: (NOTE) SARS-CoV-2 target nucleic acids are NOT DETECTED.  The SARS-CoV-2 RNA is generally detectable in upper and lower respiratory specimens during the acute phase of infection. The lowest concentration of SARS-CoV-2 viral copies this assay can detect is 250 copies / mL. A negative result does not preclude SARS-CoV-2 infection and should not be used as the sole basis for treatment or other patient management decisions.  A negative result may occur with improper specimen collection / handling, submission of specimen other than nasopharyngeal swab, presence of viral mutation(s) within the areas targeted by this assay, and inadequate number of viral copies (<250 copies / mL). A negative result must be combined with clinical observations, patient history, and epidemiological information.  Fact Sheet for Patients:   RoadLapTop.co.za  Fact Sheet for Healthcare Providers: http://kim-miller.com/  This test is not yet approved or  cleared by the [place] FDA and has been authorized for detection and/or diagnosis of SARS-CoV-2 by FDA under an Emergency Use Authorization (EUA).  This EUA will remain in effect (meaning this test can be used) for the duration of the COVID-19 declaration under Section 564(b)(1) of the Act, 21 U.S.C. section 360bbb-3(b)(1), unless the authorization is terminated or revoked sooner.  Performed at [facility], [address] [place], [place] [address]   Acetaminophen level      Status: Abnormal   Collection Time: 06/03/23  1:01 PM  Result Value Ref Range   Acetaminophen (Tylenol), Serum <10 (L) 10 - 30 ug/mL    Comment: (NOTE) Therapeutic concentrations vary significantly. A range of 10-30 ug/mL  may be an effective concentration for many patients. However, some  are best treated at concentrations outside of this range. Acetaminophen concentrations >150 ug/mL at 4 hours after ingestion  and >50 ug/mL at 12 hours after ingestion are often associated with  toxic reactions.  Performed at [facility], [address] [place], [place] [address]     Medications:  Current Facility-Administered Medications  Medication Dose Route Frequency Provider Last Rate Last Admin   FLUoxetine (PROZAC) capsule 40 mg  40 mg Oral Daily Benjiman Core, MD   40 mg at 06/03/23 1157   OLANZapine (ZYPREXA) tablet 10 mg  10 mg Oral Renee Harder, MD       traZODone (DESYREL) tablet 50 mg  50 mg Oral QHS PRN Benjiman Core, MD       Current Outpatient Medications  Medication Sig Dispense Refill   FLUoxetine (PROZAC) 20 MG capsule Take 2 capsules (40 mg total) by mouth daily. 60 capsule 3   OLANZapine (ZYPREXA) 10 MG tablet Take 1 tablet (10 mg total) by mouth at bedtime. 30 tablet 3   traZODone (DESYREL) 50 MG tablet Take 1 tablet (50 mg total) by mouth at bedtime. 30 tablet 3   traZODone (DESYREL) 50 MG tablet Take 1 tablet (50 mg total) by mouth at bedtime as needed for sleep. 30 tablet 0    Musculoskeletal: Strength & Muscle Tone: within normal limits Gait & Station: normal Patient leans: N/A          Psychiatric Specialty Exam:  Presentation  General Appearance:  Disheveled  Eye Contact: Good  Speech: Clear and Coherent  Speech Volume: Normal  Handedness: Right   Mood and Affect  Mood: Depressed; Anxious  Affect: Tearful; Congruent   Thought Process  Thought Processes: Coherent; Goal Directed  Descriptions of  Associations:Intact  Orientation:Full (Time, Place and Person)  Thought Content:Logical  History of Schizophrenia/Schizoaffective disorder:No  Duration of Psychotic Symptoms:No data recorded Hallucinations:Hallucinations: None  Ideas of Reference:None  Suicidal Thoughts:Suicidal Thoughts: Yes, Active SI Active Intent and/or Plan: With Plan  Homicidal Thoughts:Homicidal Thoughts: No   Sensorium  Memory: Immediate Good; Recent Good; Remote Good  Judgment: Poor  Insight: Poor   Executive Functions  Concentration: Fair  Attention Span: Fair  Recall: Fair  Fund of Knowledge: Fair  Language: Fair   Psychomotor Activity  Psychomotor Activity: Psychomotor Activity: Normal   Assets  Assets: Manufacturing systems engineer; Desire for Improvement; Physical Health   Sleep  Sleep: Sleep: Fair Number of Hours of Sleep: 0 (Poor sleep since discharge from inpatient psychiatric admission)    Physical Exam: Physical Exam Speaking in clear sentences. Breathing pattern is audibly normal.  Patient is asking and responding to questions appropriately.  Review of Systems  Psychiatric/Behavioral:  Positive for depression and suicidal ideas. The patient is nervous/anxious.     Blood pressure 117/78, pulse 89, temperature 97.8 F (36.6 C), temperature source Oral, resp. rate 18, height 5\' 8"  (1.727 m), weight 67.1 kg, SpO2 97 %. Body mass index is 22.5 kg/m.  Treatment Plan Summary: Suicidal ideations with a plan to cut his wrists. Patient meets criteria for inpatient psychiatric treatment given active  suicidal ideations with a plan. Patient recently psychiatrically admitted twice to [facility] behavioral health and to [facility] inpatient psychiatric treatment facilities in less than 60 days, therefore it is unclear as to the benefit of a third psychiatric admission.  Psychiatry to reevaluate patient tomorrow morning.  Social work to fax patient out to inpatient psychiatric facilities.   Home psychiatric medications have been restarted.  No changes in psychiatric medication or does adjustment recommended at this time. Daily contact with patient to assess and evaluate symptoms and progress in treatment, Medication management is current treatment plan. Disposition: Inpatient psychiatric treatment with daily rounds by Psychiatry to re-evaluate patient   This service was provided via telemedicine using a 2-way, interactive audio and video technology.  Names of all persons participating in this telemedicine service and their role in this encounter. Name: Joaquin Courts, NP  Role: Psychiatric Nurse Practitioner   Name: Frances Nickels  Role: Patient    Joaquin Courts, NP-C 06/03/2023 1:45 PM

## 2023-06-03 NOTE — ED Notes (Signed)
Security to wand pt, pt provided burgundy scrubs to change with socks. Pt calm and cooperative.

## 2023-06-04 NOTE — ED Notes (Signed)
Contacted [facility] at [phone] this morning. I was handed off by 4 different people to give report too too. No one answered the phone. Called again and spoke Roby Lofts RN and she took report.

## 2023-06-04 NOTE — ED Provider Notes (Signed)
Emergency Medicine Observation Re-evaluation Note  Angel [name] is a 42 y.o. male, seen on rounds today.  Pt initially presented to the ED for complaints of Suicidal Currently, the patient is Sting quietly.  Physical Exam  BP 106/70 (BP Location: Right Arm)   Pulse 72   Temp 98 F (36.7 C) (Oral)   Resp 15   Ht 5\' 8"  (1.727 m)   Wt 67.1 kg   SpO2 97%   BMI 22.50 kg/m  Physical Exam General: No acute distress Cardiac: Well-perfused Lungs: Nonlabored Psych: Calm  ED Course / MDM  EKG:   I have reviewed the labs performed to date as well as medications administered while in observation.  Recent changes in the last 24 hours include TTS evaluation.  Plan  Current plan is for inpatient psychiatric placement.  9:10 AM.  I was informed that the patient has been accepted to [facility] [place] Dr. Tempie Donning, Kayleen Memos, MD 06/04/23 [phone]

## 2023-06-04 NOTE — ED Notes (Signed)
Called [facility] for pick up 08:05

## 2023-06-20 ENCOUNTER — Other Ambulatory Visit: Payer: Self-pay

## 2023-06-20 ENCOUNTER — Inpatient Hospital Stay (HOSPITAL_COMMUNITY)
Admission: AD | Admit: 2023-06-20 | Discharge: 2023-07-04 | DRG: 885 | Disposition: A | Discharge status: Home / Self Care | Payer: Medicaid Other | Source: Intra-hospital | Attending: Psychiatry | Admitting: Psychiatry

## 2023-06-20 ENCOUNTER — Encounter (HOSPITAL_COMMUNITY): Payer: Self-pay | Admitting: Psychiatry

## 2023-06-20 ENCOUNTER — Emergency Department (HOSPITAL_COMMUNITY)
Admission: EM | Admit: 2023-06-20 | Discharge: 2023-06-20 | Disposition: A | Discharge status: Home / Self Care | Payer: Medicaid Other | Source: Home / Self Care | Attending: Emergency Medicine | Admitting: Emergency Medicine

## 2023-06-20 DIAGNOSIS — Z5900 Homelessness unspecified: Secondary | ICD-10-CM | POA: Insufficient documentation

## 2023-06-20 DIAGNOSIS — G4700 Insomnia, unspecified: Secondary | ICD-10-CM | POA: Diagnosis present

## 2023-06-20 DIAGNOSIS — Z560 Unemployment, unspecified: Secondary | ICD-10-CM

## 2023-06-20 DIAGNOSIS — Z808 Family history of malignant neoplasm of other organs or systems: Secondary | ICD-10-CM

## 2023-06-20 DIAGNOSIS — R45851 Suicidal ideations: Secondary | ICD-10-CM | POA: Insufficient documentation

## 2023-06-20 DIAGNOSIS — F411 Generalized anxiety disorder: Secondary | ICD-10-CM | POA: Insufficient documentation

## 2023-06-20 DIAGNOSIS — F332 Major depressive disorder, recurrent severe without psychotic features: Secondary | ICD-10-CM | POA: Diagnosis present

## 2023-06-20 DIAGNOSIS — Z8249 Family history of ischemic heart disease and other diseases of the circulatory system: Secondary | ICD-10-CM | POA: Diagnosis not present

## 2023-06-20 DIAGNOSIS — Z5941 Food insecurity: Secondary | ICD-10-CM | POA: Diagnosis not present

## 2023-06-20 DIAGNOSIS — R1031 Right lower quadrant pain: Secondary | ICD-10-CM | POA: Diagnosis present

## 2023-06-20 DIAGNOSIS — B86 Scabies: Secondary | ICD-10-CM | POA: Diagnosis present

## 2023-06-20 DIAGNOSIS — G43909 Migraine, unspecified, not intractable, without status migrainosus: Secondary | ICD-10-CM | POA: Diagnosis present

## 2023-06-20 DIAGNOSIS — Z5982 Transportation insecurity: Secondary | ICD-10-CM

## 2023-06-20 DIAGNOSIS — Z9151 Personal history of suicidal behavior: Secondary | ICD-10-CM | POA: Diagnosis not present

## 2023-06-20 DIAGNOSIS — Z5986 Financial insecurity: Secondary | ICD-10-CM | POA: Diagnosis not present

## 2023-06-20 DIAGNOSIS — Z59812 Housing instability, housed, homelessness in past 12 months: Secondary | ICD-10-CM | POA: Diagnosis present

## 2023-06-20 DIAGNOSIS — F17210 Nicotine dependence, cigarettes, uncomplicated: Secondary | ICD-10-CM | POA: Diagnosis present

## 2023-06-20 DIAGNOSIS — Z833 Family history of diabetes mellitus: Secondary | ICD-10-CM

## 2023-06-20 DIAGNOSIS — Z79899 Other long term (current) drug therapy: Secondary | ICD-10-CM

## 2023-06-20 DIAGNOSIS — F329 Major depressive disorder, single episode, unspecified: Principal | ICD-10-CM | POA: Diagnosis present

## 2023-06-20 LAB — CBC
HCT: 41.3 % (ref 39.0–52.0)
Hemoglobin: 13.9 g/dL (ref 13.0–17.0)
MCH: 29.4 pg (ref 26.0–34.0)
MCHC: 33.7 g/dL (ref 30.0–36.0)
MCV: 87.3 fL (ref 80.0–100.0)
Platelets: 256 10*3/uL (ref 150–400)
RBC: 4.73 MIL/uL (ref 4.22–5.81)
RDW: 13.8 % (ref 11.5–15.5)
WBC: 8.2 10*3/uL (ref 4.0–10.5)
nRBC: 0 % (ref 0.0–0.2)

## 2023-06-20 LAB — RAPID URINE DRUG SCREEN, HOSP PERFORMED
Amphetamines: NOT DETECTED
Barbiturates: NOT DETECTED
Benzodiazepines: NOT DETECTED
Cocaine: NOT DETECTED
Opiates: NOT DETECTED
Tetrahydrocannabinol: NOT DETECTED

## 2023-06-20 LAB — COMPREHENSIVE METABOLIC PANEL
ALT: 98 U/L — ABNORMAL HIGH (ref 0–44)
AST: 40 U/L (ref 15–41)
Albumin: 4.2 g/dL (ref 3.5–5.0)
Alkaline Phosphatase: 124 U/L (ref 38–126)
Anion gap: 7 (ref 5–15)
BUN: 13 mg/dL (ref 6–20)
CO2: 25 mmol/L (ref 22–32)
Calcium: 9 mg/dL (ref 8.9–10.3)
Chloride: 103 mmol/L (ref 98–111)
Creatinine, Ser: 0.68 mg/dL (ref 0.61–1.24)
GFR, Estimated: >60 mL/min (ref >60)
Glucose, Bld: 97 mg/dL (ref 70–99)
Potassium: 3.8 mmol/L (ref 3.5–5.1)
Sodium: 135 mmol/L (ref 135–145)
Total Bilirubin: 0.6 mg/dL (ref 0.3–1.2)
Total Protein: 7.6 g/dL (ref 6.5–8.1)

## 2023-06-20 LAB — SALICYLATE LEVEL: Salicylate Lvl: <7 mg/dL — ABNORMAL LOW (ref 7.0–30.0)

## 2023-06-20 LAB — ACETAMINOPHEN LEVEL: Acetaminophen (Tylenol), Serum: <10 ug/mL — ABNORMAL LOW (ref 10–30)

## 2023-06-20 LAB — ETHANOL: Alcohol, Ethyl (B): <10 mg/dL (ref <10)

## 2023-06-20 MED ORDER — NICOTINE 14 MG/24HR TD PT24
14.0000 mg | MEDICATED_PATCH | Freq: Every day | TRANSDERMAL | Status: DC
  Administered 2023-06-21: 14 mg via TRANSDERMAL
  Filled 2023-06-20 (×4): qty 1

## 2023-06-20 MED ORDER — ACETAMINOPHEN 325 MG PO TABS
650.0000 mg | ORAL_TABLET | Freq: Four times a day (QID) | ORAL | Status: DC | PRN
  Administered 2023-06-20: 650 mg via ORAL
  Filled 2023-06-20: qty 2

## 2023-06-20 MED ORDER — ENSURE ENLIVE PO LIQD
237.0000 mL | Freq: Two times a day (BID) | ORAL | Status: DC
  Administered 2023-06-21 (×2): 237 mL via ORAL
  Filled 2023-06-20 (×5): qty 237

## 2023-06-20 NOTE — BHH Group Notes (Signed)
The focus of this group is to help patients review their daily goal of treatment and discuss progress on daily workbooks. Pt didn't attend wrap up group tonight.

## 2023-06-20 NOTE — Progress Notes (Signed)
Patient ID: Angel [name], male   DOB: 05-22-1981, 42 y.o.   MRN: [id number] Patient admitted to [facility] from [facility] for SI with a plan to hang himself. Upon admission, patient denies HI/AVH but endorses SI with a plan to cut his wrists. Patient contracts for safety. Patient states that he is homeless and experiencing financial difficulties, making it hard to obtain his medication. Patient states that his stressor is "just life. I hate life." Skin assessment revealed a wound on his right foot, bug bites scattered over his body, and cut marks scabbed over bilateral forearms. Patient oriented to the unit. Food and drink offered but declined. Safety checks initiated. Patient remains safe at this time.

## 2023-06-20 NOTE — Tx Team (Signed)
Initial Treatment Plan 06/20/2023 6:05 PM RAINEN VANROSSUM KGM:[id number]    PATIENT STRESSORS: Financial difficulties   Occupational concerns     PATIENT STRENGTHS: Ability for insight  Active sense of humor    PATIENT IDENTIFIED PROBLEMS:                      DISCHARGE CRITERIA:  Adequate post-discharge living arrangements [facility] adequate arrangements made  PRELIMINARY DISCHARGE PLAN: Referrals indicated:  Needs placement assistance instead of being discharged to a shelter   PATIENT/FAMILY INVOLVEMENT: This treatment plan has been presented to and reviewed with the patient, Mackenzy Eisenberg. The patient and family have been given the opportunity to ask questions and make suggestions.  Cherre Blanc, RN 06/20/2023, 6:05 PM

## 2023-06-20 NOTE — ED Triage Notes (Signed)
Pt reports SI with a plan to hang himself. Pt denies auditory or visual hallucinations.

## 2023-06-20 NOTE — BH Assessment (Signed)
@  5784, requested patient's nurse Shanda Bumps, RN) to place the TTS machine in patient's room.

## 2023-06-20 NOTE — Progress Notes (Signed)
Pt has been accepted to [facility] TODAY 06/20/2023, pending EKG, UDS, and signed voluntary consent faxed to [phone]. Bed assignment: 401-2  Pt meets inpatient criteria per Assunta Found, NP  Attending Physician will be Phineas Inches, MD  Report can be called to: - Adult unit: [id number]  Pt can arrive after pending items are received  Care Team Notified: [facility] Rona Ravens, RN, Georgann Housekeeper, RN, Melynda Ripple Counselor, Sadie Haber, RN, and Myrlene Broker, RN  [place], [place]  06/20/2023 11:25 AM

## 2023-06-20 NOTE — ED Provider Notes (Addendum)
[place] EMERGENCY DEPARTMENT AT [facility] Provider Note   CSN: [id number] Arrival date & time: 06/20/23  0732     History  Chief Complaint  Patient presents with   Suicidal    Angel [name] is a 42 y.o. male.  HPI   Patient has history of depression anxiety migraines suicidal ideation.  Presents ED with complaints of wanting to hang himself and die.  Patient states life is overwhelming.  No specific events recently.  Patient is homeless.  He denies any medical concerns  Home Medications Prior to Admission medications   Medication Sig Start Date End Date Taking? Authorizing Provider  FLUoxetine (PROZAC) 20 MG capsule Take 2 capsules (40 mg total) by mouth daily. Patient not taking: Reported on 06/03/2023 05/31/23 05/30/24  Charm Rings, NP  OLANZapine (ZYPREXA) 10 MG tablet Take 1 tablet (10 mg total) by mouth at bedtime. Patient not taking: Reported on 06/03/2023 05/29/23   Sarina Ill, DO  traZODone (DESYREL) 50 MG tablet Take 1 tablet (50 mg total) by mouth at bedtime. Patient not taking: Reported on 06/03/2023 05/29/23 06/28/23  Sarina Ill, DO  traZODone (DESYREL) 50 MG tablet Take 1 tablet (50 mg total) by mouth at bedtime as needed for sleep. Patient not taking: Reported on 06/03/2023 05/31/23 06/30/23  Charm Rings, NP      Allergies    Patient has no known allergies.    Review of Systems   Review of Systems  Physical Exam Updated Vital Signs BP 124/82 (BP Location: Right Arm)   Pulse 78   Temp 98.3 F (36.8 C) (Oral)   Resp 15   SpO2 99%  Physical Exam Vitals and nursing note reviewed.  Constitutional:      Appearance: He is well-developed. He is not diaphoretic.  HENT:     Head: Normocephalic and atraumatic.     Right Ear: External ear normal.     Left Ear: External ear normal.  Eyes:     General: No scleral icterus.       Right eye: No discharge.        Left eye: No discharge.     Conjunctiva/sclera: Conjunctivae normal.   Neck:     Trachea: No tracheal deviation.  Cardiovascular:     Rate and Rhythm: Normal rate.  Pulmonary:     Effort: Pulmonary effort is normal. No respiratory distress.     Breath sounds: No stridor.  Abdominal:     General: There is no distension.  Musculoskeletal:        General: No swelling or deformity.     Cervical back: Neck supple.  Skin:    General: Skin is warm and dry.     Findings: No rash.  Neurological:     Mental Status: He is alert. Mental status is at baseline.     Cranial Nerves: No dysarthria or facial asymmetry.     Motor: No seizure activity.  Psychiatric:        Attention and Perception: He is attentive. He does not perceive auditory or visual hallucinations.        Mood and Affect: Mood is depressed. Affect is tearful.        Speech: Speech is not rapid and pressured, slurred or tangential.        Behavior: Behavior is withdrawn. Behavior is not aggressive or hyperactive.        Thought Content: Thought content includes suicidal ideation.     ED Results /  Procedures / Treatments   Labs (all labs ordered are listed, but only abnormal results are displayed) Labs Reviewed  COMPREHENSIVE METABOLIC PANEL - Abnormal; Notable for the following components:      Result Value   ALT 98 (*)    All other components within normal limits  SALICYLATE LEVEL - Abnormal; Notable for the following components:   Salicylate Lvl <7.0 (*)    All other components within normal limits  ACETAMINOPHEN LEVEL - Abnormal; Notable for the following components:   Acetaminophen (Tylenol), Serum <10 (*)    All other components within normal limits  ETHANOL  CBC  RAPID URINE DRUG SCREEN, HOSP PERFORMED    EKG None  Radiology No results found.  Procedures Procedures    Medications Ordered in ED Medications - No data to display  ED Course/ Medical Decision Making/ A&P Clinical Course as of 06/20/23 0923  Wed Jun 20, 2023  1610 Labs reviewed.  CBC metabolic panel  unremarkable.  Alcohol level negative.  Patient medically cleared for psychiatric evaluation [JK]    Clinical Course User Index [JK] Linwood Dibbles, MD                             Medical Decision Making Problems Addressed: Homelessness: chronic illness or injury Suicidal ideation: acute illness or injury that poses a threat to life or bodily functions  Amount and/or Complexity of Data Reviewed Labs: ordered. Decision-making details documented in ED Course.  Risk OTC drugs. Diagnosis or treatment significantly limited by social determinants of health.   Patient presents to the ED with complaints of suicidal ideation.  He does have history of same.  Patient was admitted to a psychiatric hospital earlier this month.  Will check screening labs.  Plan on consultation with psychiatry.  The patient has been placed in psychiatric observation due to the need to provide a safe environment for the patient while obtaining psychiatric consultation and evaluation, as well as ongoing medical and medication management to treat the patient's condition.  The patient has not been placed under full IVC at this time.        Final Clinical Impression(s) / ED Diagnoses Final diagnoses:  Suicidal ideation  Homelessness    Rx / DC Orders ED Discharge Orders     None         Linwood Dibbles, MD 06/20/23 [phone] Patient has been accepted for admission to [facility]   Linwood Dibbles, MD 06/20/23 1423

## 2023-06-20 NOTE — ED Notes (Signed)
Patient stated that he has a headache and used the bathroom, patient is calm and cooperative, no issues to report.

## 2023-06-20 NOTE — Plan of Care (Signed)

## 2023-06-20 NOTE — BH Assessment (Addendum)
Comprehensive Clinical Assessment (CCA) Note  06/20/2023 Angel [name] [id number]  Disposition: Discussed clinical details with [facility] provider Assunta Found, NP, and patient has been recommended for inpatient psychiatric treatment. Disposition Social Worker to assist with patient placement.   Chief Complaint:  Chief Complaint  Patient presents with   Suicidal   The patient demonstrates the following risk factors for suicide: Chronic risk factors for suicide include: psychiatric disorder of depression, previous suicide attempts multiple previous attempts, previous self-harm pt has scarring on arm from previous self injurious behaviors, and completed suicide in a family member. Acute risk factors for suicide include: family or marital conflict, social withdrawal/isolation, and recent discharge from inpatient psychiatry. Protective factors for this patient include:  n/a . Considering these factors, the overall suicide risk at this point appears to be high. Patient is not appropriate for outpatient follow up.   Visit Diagnosis:  MDD (major depressive disorder), recurrent severe, without psychosis (HCC)  GAD  Angel [name], Angel [name]," is a 42 year old male with a history of depressive disorder, generalized anxiety, and migraine headaches. He presents to the Emergency Department seeking help for suicidal thoughts that have been recurring for the past two days. He has a plan to hang himself and states, "I have a rope and I was planning on hanging it to commit suicide." [name] reports that his suicidal ideations have intensified "because everything in life is overwhelming." He is unsure why this has happened to him. Currently, Angel [name] continues to report suicidal ideations and is unable to identify any protective factors. He has a history of a suicide attempt 1-2 years ago and again a couple of weeks ago when he tried to slit his wrists due to unspecified life events. Additionally, he has a  history of self-injurious behaviors, including using a razor blade to make cuts on both forearms yesterday. Angel [name] reports that his brother committed suicide and that he has two aunts with mental health issues. He denies any history of trauma or abuse.  Angel [name] presents with a range of depressive symptoms, including hopelessness, worthlessness, fatigue, lack of motivation, irritability, guilt, despondence, neglect of personal hygiene/grooming, and insomnia, with a reported sleep duration of 3-4 hours per night. He has Angel experienced significant weight loss, losing 30 pounds over a short period due to poor appetite. He acknowledges issues with anxiety and panic attacks, stating that his last panic attack was yesterday. Angel [name] Angel reports homicidal thoughts, violent behaviors, legal issues, auditory or visual hallucinations, and symptoms of paranoia.  Angel [name] admits to recent marijuana use, starting at age 54, with sporadic use approximately once every 1-2 months, and his last use was 1.5 months ago. He denies the use of alcohol, cocaine, or other illicit substances. His past psychiatric history includes multiple admissions at [facility] on 10/31/2013, 05/16/2017, 03/09/2018, and 08/28/2018. He recently had a psychiatric inpatient hospitalization at [facility] and was unhappy with his discharge. He was prescribed Prozac, Vistaril, and Remeron but says he did not receive the medication or instructions on how to obtain it. Additionally, he recently completed a two-week inpatient psychiatric treatment at [facility], stating he was discharged one week ago. He is unable to recall any of his discharge plans. Since his most recent discharge, he has not taken any medication due to lack of access. He currently receives outpatient therapy and medication management at [facility] in [facility] but admits to being noncompliant with his appointments and medication management.  Angel [name] is single,  has no  children, is currently homeless, and resides in abandoned houses. He states that he has no support system. He is unemployed and completed his education up to the 10th grade. His hobbies include drawing.  Angel [name] reports feeling depressed, experiencing thoughts of worthlessness and hopelessness, increased agitation, and insomnia. He feels he is currently a danger to himself.  CCA Screening, Triage and Referral (STR)  Patient Reported Information How did you hear about [place]? Self  What Is the Reason for Your Visit/Call Today? Pt reports SI with a plan to hang himself. Pt denies auditory or visual hallucinations.  How Long Has This Been Causing You Problems? <Week  What Do You Feel Would Help You the Most Today? Treatment for Depression or other mood problem; Medication(s); Housing Assistance   Have You Recently Had Any Thoughts About Hurting Yourself? Yes  Are You Planning to Commit Suicide/Harm Yourself At This time? No   Flowsheet Row ED from 06/20/2023 in [facility] Emergency Department at [facility] ED from 06/03/2023 in [facility] Emergency Department at [facility] Admission (Discharged) from 05/23/2023 in [facility] INPATIENT BEHAVIORAL MEDICINE  C-SSRS RISK CATEGORY Moderate Risk High Risk High Risk       Have you Recently Had Thoughts About Hurting Someone Karolee Ohs? No  Are You Planning to Harm Someone at This Time? No  Explanation: n/a   Have You Used Any Alcohol or Drugs in the Past 24 Hours? No  What Did You Use and How Much? Patient denies substance use currently.   Do You Currently Have a Therapist/Psychiatrist? Yes  Name of Therapist/Psychiatrist: Name of Therapist/Psychiatrist: [facility]   Have You Been Recently Discharged From Any Office Practice or Programs? Yes  Explanation of Discharge From Practice/Program: Patient was recently discharged from [facility] for psychiatric admission. Date of admission     CCA Screening Triage Referral  Assessment Type of Contact: Tele-Assessment  Telemedicine Service Delivery: Telemedicine service delivery: This service was provided via telemedicine using a 2-way, interactive audio and video technology  Is this Initial or Reassessment? Is this Initial or Reassessment?: Initial Assessment  Date Telepsych consult ordered in [facility]:  Date Telepsych consult ordered in [facility]: 06/20/23  Time Telepsych consult ordered in [facility]:    Location of Assessment: [facility] ED  Provider Location: [facility] Assessment Services   Collateral Involvement: Epic review   Does Patient Have a Automotive engineer Guardian? No  Legal Guardian Contact Information: Patient does not have a legal guardian.  Copy of Legal Guardianship Form: No - copy requested  Legal Guardian Notified of Arrival: -- (n/a)  Legal Guardian Notified of Pending Discharge: -- (n/a)  If Minor and Not Living with Parent(s), Who has Custody? n/a  Is CPS involved or ever been involved? Never  Is APS involved or ever been involved? Never   Patient Determined To Be At Risk for Harm To Self or Others Based on Review of Patient Reported Information or Presenting Complaint? Yes, for Self-Harm  Method: Plan with intent and identified person  Availability of Means: In hand or used  Intent: Clearly intends on inflicting harm that could cause death  Notification Required: No need or identified person  Additional Information for Danger to Others Potential: Previous attempts  Additional Comments for Danger to Others Potential: None noted  Are There Guns or Other Weapons in Your Home? No  Types of Guns/Weapons: NA  Are These Weapons Safely Secured?  No  Who Could Verify You Are Able To Have These Secured: n/a--no weapons  Do You Have any Outstanding Charges, Pending Court Dates, Parole/Probation? none reported  Contacted To Inform of Risk of Harm To Self or Others: Other: Comment    Does Patient Present under  Involuntary Commitment? No    [place] of Residence: [place]   Patient Currently Receiving the Following Services: Medication Management (Previous patient at [facility] in [place]. He is currently not a patient.)   Determination of Need: Urgent (48 hours)   Options For Referral: Medication Management; Inpatient Hospitalization; Outpatient Therapy; Partial Hospitalization; Intensive Outpatient Therapy     CCA Biopsychosocial Patient Reported Schizophrenia/Schizoaffective Diagnosis in Past: No   Strengths: Pt is willing to get help--both outpatient and inpatient   Mental Health Symptoms Depression:   Change in energy/activity; Difficulty Concentrating; Fatigue; Hopelessness; Increase/decrease in appetite; Irritability; Sleep (too much or little); Tearfulness; Weight gain/loss; Worthlessness   Duration of Depressive symptoms:  Duration of Depressive Symptoms: Greater than two weeks   Mania:   Racing thoughts   Anxiety:    Worrying; Difficulty concentrating; Fatigue; Irritability; Restlessness; Sleep   Psychosis:   None   Duration of Psychotic symptoms:    Trauma:   Irritability/anger; Re-experience of traumatic event   Obsessions:   None   Compulsions:   None   Inattention:   None   Hyperactivity/Impulsivity:   None   Oppositional/Defiant Behaviors:   None   Emotional Irregularity:   Chronic feelings of emptiness; Recurrent suicidal behaviors/gestures/threats   Other Mood/Personality Symptoms:   None noted    Mental Status Exam Appearance and self-care  Stature:   Average   Weight:   Average weight   Clothing:   Disheveled   Grooming:   Neglected   Cosmetic use:   None   Posture/gait:   Normal   Motor activity:   Not Remarkable   Sensorium  Attention:   Normal   Concentration:   Normal   Orientation:   X5   Recall/memory:   Normal   Affect and Mood  Affect:   Depressed   Mood:   Depressed   Relating   Eye contact:   Normal   Facial expression:   Depressed   Attitude toward examiner:   Cooperative   Thought and Language  Speech flow:  Clear and Coherent   Thought content:   Appropriate to Mood and Circumstances   Preoccupation:   None   Hallucinations:   None   Organization:   Coherent; Engineer, site of Knowledge:   Average   Intelligence:   Average   Abstraction:   Normal   Judgement:   Dangerous   Reality Testing:   Realistic   Insight:   Gaps   Decision Making:   Impulsive   Social Functioning  Social Maturity:   Impulsive   Social Judgement:   Heedless   Stress  Stressors:   Housing; Family conflict   Coping Ability:   Overwhelmed; Exhausted   Skill Deficits:   Self-control; Interpersonal; Decision making; Activities of daily living   Supports:   Support needed     Religion: Religion/Spirituality Are You A Religious Person?: No How Might This Affect Treatment?: NA  Leisure/Recreation: Leisure / Recreation Do You Have Hobbies?: Yes Leisure and Hobbies: From previous CCA: "Playing video games and drawing."  Exercise/Diet: Exercise/Diet Do You Exercise?: No Have You Gained or Lost A Significant Amount of Weight in the Past Six Months?: Yes-Lost  Number of Pounds Lost?: 30 Do You Follow a Special Diet?: No Do You Have Any Trouble Sleeping?: Yes Explanation of Sleeping Difficulties: Pt reports insomnia--poor quality and quantity of sleep (inconsistent)   CCA Employment/Education Employment/Work Situation: Employment / Work Situation Employment Situation: Unemployed Patient's Job has Been Impacted by Current Illness: No Has Patient ever Been in Equities trader?: No  Education: Education Is Patient Currently Attending School?: No Last Grade Completed: 12 Did You Product manager?: No Did You Have An Individualized Education Program (IIEP): No Did You Have Any Difficulty At [employer]?:  No Patient's Education Has Been Impacted by Current Illness: No   CCA Family/Childhood History Family and Relationship History: Family history Marital status: Single Does patient have children?: No  Childhood History:  Childhood History Description of patient's current relationship with siblings: Younger brother hung himself a few years ago. Pt shares that his older brother has health issues. Did patient suffer any verbal/emotional/physical/sexual abuse as a child?: No Did patient suffer from severe childhood neglect?: No Has patient ever been sexually abused/assaulted/raped as an adolescent or adult?: No Was the patient ever a victim of a crime or a disaster?: No Witnessed domestic violence?: No Has patient been affected by domestic violence as an adult?: No       CCA Substance Use Alcohol/Drug Use: Alcohol / Drug Use Pain Medications: SEE MAR Prescriptions: SEE MAR Over the Counter: SEE MAR History of alcohol / drug use?: Yes Longest period of sobriety (when/how long): Previous history of THC use Negative Consequences of Use: Financial, Personal relationships, Work / Programmer, multimedia Withdrawal Symptoms: None Substance #1 Name of Substance 1: THC 1 - Age of First Use: 15 1 - Amount (size/oz): variable 1 - Frequency: regularly 1 - Duration: on-going 1 - Last Use / Amount: 1-2 months ago 1 - Method of Aquiring: "streets" 1- Route of Use: oral and smoke                       ASAM's:  Six Dimensions of Multidimensional Assessment  Dimension 1:  Acute Intoxication and/or Withdrawal Potential:   Dimension 1:  Description of individual's past and current experiences of substance use and withdrawal: pt denies  Dimension 2:  Biomedical Conditions and Complications:   Dimension 2:  Description of patient's biomedical conditions and  complications: pt denies  Dimension 3:  Emotional, Behavioral, or Cognitive Conditions and Complications:     Dimension 4:  Readiness to  Change:  Dimension 4:  Description of Readiness to Change criteria: pt denies  Dimension 5:  Relapse, Continued use, or Continued Problem Potential:     Dimension 6:  Recovery/Living Environment:  Dimension 6:  Recovery/Iiving environment criteria description: pt denies  ASAM Severity Score: ASAM's Severity Rating Score: 0  ASAM Recommended Level of Treatment: ASAM Recommended Level of Treatment: Level III Residential Treatment   Substance use Disorder (SUD) Substance Use Disorder (SUD)  Checklist Symptoms of Substance Use: Continued use despite having a persistent/recurrent physical/psychological problem caused/exacerbated by use, Large amounts of time spent to obtain, use or recover from the substance(s), Continued use despite persistent or recurrent social, interpersonal problems, caused or exacerbated by use, Evidence of tolerance, Persistent desire or unsuccessful efforts to cut down or control use, Presence of craving or strong urge to use, Recurrent use that results in a failure to fulfill major role obligations (work, school, home), Repeated use in physically hazardous situations, Social, occupational, recreational activities given up or reduced due to use, Substance(s) often taken  in larger amounts or over longer times than was intended  Recommendations for Services/Supports/Treatments: Recommendations for Services/Supports/Treatments Recommendations For Services/Supports/Treatments: Individual Therapy, Medication Management, ACCTT (Assertive Community Treatment), Intensive In-Home Services  Discharge Disposition:    DSM5 Diagnoses: Patient Active Problem List   Diagnosis Date Noted   Suicidal ideation 05/03/2023   GERD (gastroesophageal reflux disease) 03/11/2018   Tobacco use disorder 07/17/2015   Generalized anxiety disorder 11/01/2013   MDD (major depressive disorder), recurrent severe, without psychosis (HCC) 10/31/2013     Referrals to Alternative Service(s): Referred to  Alternative Service(s):   Place:   Date:   Time:    Referred to Alternative Service(s):   Place:   Date:   Time:    Referred to Alternative Service(s):   Place:   Date:   Time:    Referred to Alternative Service(s):   Place:   Date:   Time:     Melynda Ripple, Counselor

## 2023-06-21 DIAGNOSIS — F332 Major depressive disorder, recurrent severe without psychotic features: Secondary | ICD-10-CM

## 2023-06-21 DIAGNOSIS — B86 Scabies: Secondary | ICD-10-CM | POA: Diagnosis present

## 2023-06-21 DIAGNOSIS — Z59812 Housing instability, housed, homelessness in past 12 months: Secondary | ICD-10-CM | POA: Diagnosis present

## 2023-06-21 MED ORDER — MIRTAZAPINE 7.5 MG PO TABS
7.5000 mg | ORAL_TABLET | Freq: Every day | ORAL | Status: DC
  Administered 2023-06-21: 7.5 mg via ORAL
  Filled 2023-06-21 (×2): qty 1

## 2023-06-21 MED ORDER — MAGNESIUM HYDROXIDE 400 MG/5ML PO SUSP: 30.0000 mL | Freq: Every day | ORAL | Status: DC | PRN

## 2023-06-21 MED ORDER — FLUOXETINE HCL 20 MG PO CAPS
40.0000 mg | ORAL_CAPSULE | Freq: Every day | ORAL | Status: DC
  Administered 2023-06-21 – 2023-06-22 (×2): 40 mg via ORAL
  Filled 2023-06-21 (×3): qty 2

## 2023-06-21 MED ORDER — TRAZODONE HCL 50 MG PO TABS
50.0000 mg | ORAL_TABLET | Freq: Every day | ORAL | Status: DC
  Administered 2023-06-21: 50 mg via ORAL
  Filled 2023-06-21 (×2): qty 1

## 2023-06-21 MED ORDER — HYDROXYZINE HCL 25 MG PO TABS
25.0000 mg | ORAL_TABLET | Freq: Three times a day (TID) | ORAL | Status: DC
  Administered 2023-06-21 – 2023-06-22 (×3): 25 mg via ORAL
  Filled 2023-06-21 (×8): qty 1

## 2023-06-21 MED ORDER — ALUM & MAG HYDROXIDE-SIMETH 200-200-20 MG/5ML PO SUSP: 30.0000 mL | ORAL | Status: DC | PRN

## 2023-06-21 MED ORDER — PERMETHRIN 5 % EX CREA
TOPICAL_CREAM | Freq: Once | CUTANEOUS | Status: AC
  Filled 2023-06-21: qty 60

## 2023-06-21 MED ORDER — ACETAMINOPHEN 325 MG PO TABS
650.0000 mg | ORAL_TABLET | Freq: Four times a day (QID) | ORAL | Status: DC | PRN
  Administered 2023-06-21 – 2023-06-22 (×2): 650 mg via ORAL
  Filled 2023-06-21 (×2): qty 2

## 2023-06-21 MED ORDER — OLANZAPINE 10 MG PO TABS
10.0000 mg | ORAL_TABLET | Freq: Every day | ORAL | Status: DC
  Filled 2023-06-21 (×2): qty 1

## 2023-06-21 MED ORDER — LORATADINE 10 MG PO TABS
10.0000 mg | ORAL_TABLET | Freq: Every day | ORAL | Status: DC
  Administered 2023-06-21 – 2023-06-22 (×2): 10 mg via ORAL
  Filled 2023-06-21 (×4): qty 1

## 2023-06-21 MED ORDER — HYDROCORTISONE 0.5 % EX CREA
TOPICAL_CREAM | Freq: Once | CUTANEOUS | Status: AC
  Administered 2023-06-21: 1 via TOPICAL
  Filled 2023-06-21: qty 28.35

## 2023-06-21 NOTE — Plan of Care (Signed)
  Problem: Education: Goal: Knowledge of  General Education information/materials will improve Outcome: Progressing   Problem: Education: Goal: Emotional status will improve Outcome: Progressing   Problem: Education: Goal: Mental status will improve Outcome: Progressing   Problem: Education: Goal: Verbalization of understanding the information provided will improve Outcome: Progressing   Problem: Activity: Goal: Sleeping patterns will improve Outcome: Progressing   Problem: Medication: Goal: Compliance with prescribed medication regimen will improve Outcome: Progressing

## 2023-06-21 NOTE — Group Note (Signed)
   Type of Therapy and Topic: Group Therapy: Relationship Check-Ins   Participation Level: Did Not Attend   Description Group:  In this group patients are encouraged to rate how well or not well they are able to improve their relationships in Beliefs and Values, Communication, Family and Friends, Actuary and Household, and Intimacy. Patients will be able to discuss and identify what is going well within these aspects and what is not. Patients will be able to find appropriate ways, solutions, and skills that will help them within the selected relationships category. Patients will be encouraged to share and reflect on why things within their relationships are not going so well and get feedback from the instructor or their peers.  This group will be solution focused and process-oriented with patients' participation in sharing and listening to their own and peers experience; along with receive support and advice on how to improve or change the circumstance that is known to be challenging in their relationships category (Beliefs and Values, Communication, Family and Friends, Actuary and Household, and Intimacy).   Therapeutic Goals:  Patient will identify their strengths and weakness within their Beliefs and Values, Communication, Family and Friends, Actuary and Household, and Intimacy relationships. Patient will identify reasons why and how they can improve their relationships.  Patient will identify how they can be supportive and honest to themselves and in their relationships.  Patient will be able to gain support and give support to others with similar challenges.   Summary of Patient Progress    Therapeutic Modalities:  Solution Focused Therapy Cognitive Behavioral Therapy  Psychodynamic Therapy  Dialectical Behavior Therapy    Marinda Elk, LCSW 06/21/2023  1:26 PM

## 2023-06-21 NOTE — BHH Counselor (Signed)
Adult Comprehensive Assessment  Patient ID: Angel [name], male   DOB: Apr 26, 1981, 42 y.o.   MRN: [id number]  Information Source: Information source: Patient  Current Stressors:  Patient states their primary concerns and needs for treatment are:: " Suicide Patient states their goals for this hospitilization and ongoing recovery are:: " resources for housing " Educational / Learning stressors: None reported Employment / Job issues: " I don't have a job " Family Relationships: None reported Surveyor, quantity / Lack of resources (include bankruptcy): " I have no money at [employer] / Lack of housing: " I have been homeless for about 2 months in a tent " Physical health (include injuries & life threatening diseases): None reported Social relationships: None reported Substance abuse: None reported Bereavement / Loss: " my brother and mom that I still think about "  Living/Environment/Situation:  Living Arrangements: Alone Living conditions (as described by patient or guardian): Homeless in a tent in the woods Who else lives in the home?: pt states that he is all alone How long has patient lived in current situation?: Pt states 2 months What is atmosphere in current home: Chaotic, Temporary, Other (Comment), Dangerous (" it is rough ")  Family History:  Marital status: Single Are you sexually active?: No What is your sexual orientation?: Straight Has your sexual activity been affected by drugs, alcohol, medication, or emotional stress?: N/A Does patient have children?: No  Childhood History:  By whom was/is the patient raised?: Both parents Additional childhood history information: Patient reports having a poor and rough childhood Description of patient's relationship with caregiver when they were a child: Mother: good; Father: "He's no good." Patient's description of current relationship with people who raised him/her: Mother is deceased and father is in a nursing home. Pt denies having  anything to do with him How were you disciplined when you got in trouble as a child/adolescent?: " spankings " Does patient have siblings?: Yes Number of Siblings: 2 Description of patient's current relationship with siblings: Younger brother hung himself a few years ago, older brother has health issues and his sister is a " drug head " Did patient suffer any verbal/emotional/physical/sexual abuse as a child?: No Did patient suffer from severe childhood neglect?: No Has patient ever been sexually abused/assaulted/raped as an adolescent or adult?: No Was the patient ever a victim of a crime or a disaster?: No Witnessed domestic violence?: No Has patient been affected by domestic violence as an adult?: No  Education:  Highest grade of school patient has completed: 10th grade Currently a student?: No Learning disability?: Yes What learning problems does patient have?: Pt states that he was in special education classes  Employment/Work Situation:   Employment Situation: Unemployed Patient's Job has Been Impacted by Current Illness: No What is the Longest Time Patient has Held a Job?: 20 years 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?: No  Financial Resources:   Surveyor, quantity resources: Sales executive, Medicaid Does patient have a Lawyer or guardian?: No  Alcohol/Substance Abuse:   What has been your use of drugs/alcohol within the last 12 months?: Pt denies any use of the two Alcohol/Substance Abuse Treatment Hx: Denies past history If yes, describe treatment: N/A Has alcohol/substance abuse ever caused legal problems?: No  Social Support System:   Forensic psychologist System: None Type of faith/religion: No How does patient's faith help to cope with current illness?: No  Leisure/Recreation:   Do You Have  Hobbies?: Yes Leisure and Hobbies: From previous CCA: "Playing video games and drawing."  Strengths/Needs:    What is the patient's perception of their strengths?: "Pretty good drawer, I like to read." Patient states they can use these personal strengths during their treatment to contribute to their recovery: " Drawing to keep my mind going " Patient states these barriers may affect/interfere with their treatment: Pt denies Patient states these barriers may affect their return to the community: Living in a tent in the woods Other important information patient would like considered in planning for their treatment: N/A  Discharge Plan:   Currently receiving community mental health services: Yes (From [facility]om[facility]) Patient states concerns and preferences for aftercare planning are: Pt states that he will like to return back [facility] to see Dr[facility] Geanie Cooley Patient states they will know when they are safe and ready for discharge when: pt did not say Does patient have access to transportation?: No Does patient have financial barriers related to discharge medications?: No Patient description of barriers related to discharge medications: pt has insurance Plan for no access to transportation at discharge: Pt will likely recieve a Taxi along with a bus pass to get around Plan for living situation after discharge: Pt will likely DC to a local shelter if he desires to be in [place] because are none in [place]  Summary/Recommendations:   Summary and Recommendations (to be completed by the evaluator): Angel [name] " Angel [name]" is a 42 y/o male who shared that he was admitted because of Suicide and being homeless. Angel [name] shared that this is not his first hospitalization and has a history of GAD, MDD, and SI. Patient did not display any symptoms during assessment but was very soft spoken to where CSW had to ask patient to repeat what he said to questions. CSW explained limitations around housing options in [facility] , but did inform him of the local shelters in [place].  Angel [name] states that his main  stressors are finances , family, housing, bereavement, and not having support. Patient is connected to [facility] for his Mental Health needs and has food stamps and Medicaid .While here, Angel [name] " Angel [name]"can benefit from crisis stabilization, medication management, therapeutic milieu, and referrals for services.   Angel [name]. 06/21/2023

## 2023-06-21 NOTE — Progress Notes (Signed)
   06/20/23 2115  Psych Admission Type (Psych Patients Only)  Admission Status Voluntary  Psychosocial Assessment  Patient Complaints Anxiety;Depression;Hopelessness  Eye Contact Fair  Facial Expression Sullen  Affect Appropriate to circumstance  Speech Logical/coherent  Interaction Minimal  Motor Activity Slow  Appearance/Hygiene Poor hygiene;Disheveled  Behavior Characteristics Cooperative  Mood Depressed  Thought Process  Coherency WDL  Content WDL  Delusions None reported or observed  Perception WDL  Hallucination None reported or observed  Judgment Limited  Confusion None  Danger to Self  Current suicidal ideation? Active  Description of Suicide Plan cut wrists  Agreement Not to Harm Self Yes  Description of Agreement Verbal  Danger to Others  Danger to Others None reported or observed

## 2023-06-21 NOTE — B[facility] Group Notes (Signed)
Psychoeducational Group Note  Date:  06/21/2023 Time:  9:56  Group Topic/Focus:  Goals Group:   The focus of this group is to help patients establish daily goals to achieve during treatment and discuss how the patient can incorporate goal setting into their daily lives to aide in recovery.  Participation Level: Did Not Attend  Participation Quality:  Not Applicable  Affect:  Not Applicable  Cognitive:  Not Applicable  Insight:  Not Applicable  Engagement in Group: Not Applicable  Additional Comments:  Pt did not attend goal group due to being in bed.  Jakota Manthei, Sharen Counter 06/21/2023, 9:55 AM

## 2023-06-21 NOTE — BHH Suicide Risk Assessment (Signed)
B[facility] INPATIENT:  Family/Significant Other Suicide Prevention Education  Suicide Prevention Education:  Patient Refusal for Family/Significant Other Suicide Prevention Education: The patient Angel [name] has refused to provide written consent for family/significant other to be provided Family/Significant Other Suicide Prevention Education during admission and/or prior to discharge.  Physician notified.  Isabella Bowens 06/21/2023, 2:36 PM

## 2023-06-21 NOTE — BHH Suicide Risk Assessment (Signed)
Suicide Risk Assessment  Admission Assessment    [facility] Admission Suicide Risk Assessment   Nursing information obtained from:  Patient Demographic factors:  Male, Caucasian, Low socioeconomic status, Unemployed Current Mental Status:  Suicidal ideation indicated by patient, Suicide plan, Self-harm behaviors Loss Factors:  Financial problems / change in socioeconomic status Historical Factors:  Prior suicide attempts Risk Reduction Factors:  NA  Total Time spent with patient: 45 minutes Principal Problem: MDD (major depressive disorder) Diagnosis:  Principal Problem:   MDD (major depressive disorder) Active Problems:   Generalized anxiety disorder   Housing instability after recent homelessness   Scabies  Subjective Data:  Patient reports that he has been living with some people about 2 months ago and and they had all of the sudden abandoned him and he had to move out.  For the first time in his life at age 42 he was experiencing homelessness and was living in a tent adjacent to this house in the woods.  He reported that during this time that his "his anxiety has been so high" and that he "wishes he was dead."  He reports that he has reached out to others for support but that nobody would help him including his family and friends.  During the interview he says multiple times that he "wants to die."  He reported that he had been hospitalized a couple weeks for the same concerns.  Over the past 2 weeks he has been experiencing all symptoms of depression including active suicidal thoughts.  In addition he experiences severe anxiety including claustrophobia (for example he was in a tent and started panicking from the enclosed nature and was brought the 10 target) and panic attacks.     Regarding negative psych review of systems, he denies any periods in his life where he had decreased need for sleep, increased activity, and increased/expansive/irritable mood.  He denies any obsessions or  compulsions.  He denies hearing voices telling him to end his life or other auditory hallucinations.  He denies that people are conspiring against him, spying on him, or trying harm in any way.  He reports no substance use aside from occasional cigarette use.  He denies any experience in life where he was physically, emotionally, or sexually abused.  Regarding his sleep he has issues falling asleep and staying asleep.  He also reports that he typically does not have much appetite.   Furthermore the patient is experiencing severe pruritus.  See exam for detailed description of the papules.  He reports that he is never experienced this in the past.  He reports seeing no mites, insects, spiders.    Patient reports that we can contact his brother, although the contact he gives is the wrong number.    Continued Clinical Symptoms:  Alcohol Use Disorder Identification Test Final Score (AUDIT): 0 The "Alcohol Use Disorders Identification Test", Guidelines for Use in Primary Care, Second Edition.  World Science writer [facility]). Score between 0-7:  no or low risk or alcohol related problems. Score between 8-15:  moderate risk of alcohol related problems. Score between 16-19:  high risk of alcohol related problems. Score 20 or above:  warrants further diagnostic evaluation for alcohol dependence and treatment.   CLINICAL FACTORS:   Severe Anxiety and/or Agitation Depression:   Anhedonia Hopelessness Insomnia Severe   Musculoskeletal: Strength & Muscle Tone: within normal limits Gait & Station: normal Patient leans: N/A  Psychiatric Specialty Exam:  Presentation  General Appearance:  Appropriate for Environment  Eye Contact:  Good  Speech: Normal Rate  Speech Volume: Normal  Handedness: Right   Mood and Affect  Mood: Dysphoric  Affect: Depressed; Congruent; Tearful   Thought Process  Thought Processes: Linear  Descriptions of Associations:Intact  Orientation:Full  (Time, Place and Person)  Thought Content:Logical  History of Schizophrenia/Schizoaffective disorder:No  Duration of Psychotic Symptoms:No data recorded Hallucinations:Hallucinations: None  Ideas of Reference:None  Suicidal Thoughts:Suicidal Thoughts: Yes, Active SI Active Intent and/or Plan: Without Plan; With Intent  Homicidal Thoughts:Homicidal Thoughts: No   Sensorium  Memory: Immediate Good; Recent Good; Remote Good  Judgment: Poor  Insight: Fair   Chartered certified accountant: Fair  Attention Span: Good  Recall: Good  Fund of Knowledge: Good  Language: Good   Psychomotor Activity  Psychomotor Activity: Psychomotor Activity: Normal   Assets  Assets: Desire for Improvement   Sleep  Sleep: Sleep: Fair Number of Hours of Sleep: 8    Physical Exam: Physical Exam Vitals and nursing note reviewed.  Constitutional:      Comments: Patient is in acute distress and aggressively itching his body and visibly uncomfortable.  There are significant scratch marks where his papules are. Patient also appears malnourished.  HENT:     Head: Normocephalic and atraumatic.  Pulmonary:     Effort: Pulmonary effort is normal.  Skin:    Comments: Small 3 to 5 mm pruritic papules not uniformly scattered along the bilateral arms, bilateral legs, back.  Does not involve creases, interdigital spaces, underarms.  No signs of insects.  Neurological:     General: No focal deficit present.     Mental Status: He is alert.      Review of Systems  Constitutional:  Negative for fever.  Cardiovascular:  Negative for chest pain and palpitations.  Gastrointestinal:  Negative for constipation, diarrhea, nausea and vomiting.  Neurological:  Negative for dizziness, weakness and headaches.    Blood pressure 112/79, pulse 95, temperature 98.6 F (37 C), temperature source Oral, resp. rate 16, height 5\' 8"  (1.727 m), weight 71.2 kg, SpO2 98%. Body mass index is 23.87  kg/m.   COGNITIVE FEATURES THAT CONTRIBUTE TO RISK:  Thought constriction (tunnel vision)    SUICIDE RISK:   Extreme:  Frequent, intense, and enduring suicidal ideation, specific plans, clear subjective and objective intent, impaired self-control, severe dysphoria/symptomatology, many risk factors and no protective factors.   ASSESSMENT & PLAN   ASSESSMENT:   Diagnoses / Active Problems: MDD (major depressive disorder) GAD Experiencing helplessness (2 months)   "Angel [name]" Angel [name] is a 42 year old male with a past history of DT, GAD, numerous hospitalizations for suicide ideation and attempt who presents to the behavioral health Hospital for all symptoms of depression, severe anxiety, active suicidal thoughts with intention but no plan.  Furthermore the patient is experiencing homelessness for the first time in his life.   Patient currently meets criteria for MDD and GAD.  We restarted fluoxetine, Remeron, scheduled hydroxyzine, all of which he has responded in the past.  Given his intolerable living situation, social work will give patient resources for safe shelters in [place] (fortunately there are no shelters in his home county [place]).  Infectious disease was consulted on the patient and they diagnosed scabies and recommended permethrin cream once today and again in a week.  ID also recommended screening testing for syphilis and HIV.  In addition to the permethrin treatment, we hope that shelter resource will prevent him from returning to his tent and [facility] where he got scabies originally.  Patient has been educated on scabies and information around reinfection and treatment and he voices understanding by teach back.  Given the severity of his current psychiatric conditions and comorbid scabies infection, he will likely need 7 days in the behavioral health Hospital, and this will offer him to receive his second dose of permethrin.  Furthermore the patient is malnourished  and will receive ensures between meals.     PLAN: Safety and Monitoring:             -- Involuntary admission to inpatient psychiatric unit for safety, stabilization and treatment             -- Daily contact with patient to assess and evaluate symptoms and progress in treatment             -- Patient's case to be discussed in multi-disciplinary team meeting             -- Observation Level : q15 minute checks             -- Vital signs:  q12 hours             -- Precautions: suicide, elopement, and assault   2. Psychiatric Diagnoses and Treatment:  -- Start fluoxetine 40 mg once daily for MDD, GAD             -- Start Mirtazapine 7.5 mg once at night for MDD, insomnia             -- Start hydroxyzine 25 mg as needed 3 times daily for anxiety             -- Start trazodone 50 mg as needed once at night for insomnia --  The risks/benefits/side-effects/alternatives to this medication were discussed in detail with the patient and time was given for questions. The patient consents to medication trial.              -- Metabolic profile and EKG monitoring obtained while on an atypical antipsychotic. See #4 below for values.              -- Encouraged patient to participate in unit milieu and in scheduled group therapies              -- Short Term Goals: Ability to identify changes in lifestyle to reduce recurrence of condition will improve, Ability to verbalize feelings will improve, Ability to disclose and discuss suicidal ideas, Ability to demonstrate self-control will improve, Ability to identify and develop effective coping behaviors will improve, Ability to maintain clinical measurements within normal limits will improve, Compliance with prescribed medications will improve, and Ability to identify triggers associated with substance abuse/mental health issues will improve             -- Long Term Goals: Improvement in symptoms so as ready for discharge                3. Medical Issues Being  Addressed:              -- Scabies infestation: Started permethrin topical applied once to the affected areas.  Apply again in 1 week.             -- Migraines: Tylenol as needed available.     4. Routine and other pertinent labs reviewed: EKG monitoring: QTc: 444   Metabolism / endocrine: BMI: Body mass index is 23.87 kg/m. Prolactin: Recent Labs       Lab Results  Component Value Date  PROLACTIN 6.7 08/28/2018    PROLACTIN 17.3 (H) 05/17/2017      Lipid Panel: Recent Labs       Lab Results  Component Value Date    CHOL 168 04/30/2023    TRIG 108 04/30/2023    HDL 55 04/30/2023    CHOLHDL 3.1 04/30/2023    VLDL 22 04/30/2023    LDLCALC 91 04/30/2023    LDLCALC 104 (H) 08/28/2018      HbgA1c: Last Labs     Hgb A1c MFr Bld (%)  Date Value  04/30/2023 5.3      TSH: Last Labs     TSH (uIU/mL)  Date Value  08/28/2018 1.582        Labs to order: RPR, HIV   5. Discharge Planning:              -- Social work and case management to assist with discharge planning and identification of hospital follow-up needs prior to discharge             -- Estimated LOS: 7 days             -- Discharge Concerns: Need to establish a safety plan; Medication compliance and effectiveness             -- Discharge Goals: Return home with outpatient referrals for mental health follow-up including medication management/psychotherapy   I certify that inpatient services furnished can reasonably be expected to improve the patient's condition.   I certify that inpatient services furnished can reasonably be expected to improve the patient's condition.   Meryl Dare, MD 06/21/2023, 5:01 PM

## 2023-06-21 NOTE — Progress Notes (Signed)
   06/21/23 1308  15 Minute Checks  Location Bedroom  Visual Appearance Calm  Behavior Composed  Sleep (Behavioral Health Patients Only)  Calculate sleep? (Click Yes once per 24 hr at 0600 safety check) Yes  Documented sleep last 24 hours 10

## 2023-06-21 NOTE — H&P (Signed)
Psychiatric Admission Assessment Adult  Patient Identification: Angel [name] MRN:  [id number] Date of Evaluation:  06/21/2023 Chief Complaint:  MDD (major depressive disorder) [F32.9] Principal Diagnosis: MDD (major depressive disorder) Diagnosis:  Principal Problem:   MDD (major depressive disorder) Active Problems:   Generalized anxiety disorder   Housing instability after recent homelessness   Scabies  CC: suicidal thoughts  Angel [name] is a 42 year old male with past psychiatric history of MDD, GAD; psychiatric hospitalization 2 weeks ago (SI) and numerous in the past (SI/SA); history of self-harm and 1 suicide attempt by wrist cutting.  Mode of transport to Hospital: unclear Current Outpatient (Home) Medication List: No current outpatient medications PRN medication prior to evaluation: No current as needed medications outpatient.  ED course: Uneventful. Collateral Information: No collateral information. POA/Legal Guardian: n/a  HPI:  Patient reports that he has been living with some people about 2 months ago and and they had all of the sudden abandoned him and he had to move out.  For the first time in his life at age 40 he was experiencing homelessness and was living in a tent adjacent [place] this [place].  He reported that during this time that his "his anxiety has been so high" and that he "wishes he was dead."  He reports that he has reached out to others for support but that nobody would help him including his family and friends.  During the interview he says multiple times that he "wants to die."  He reported that he had been hospitalized a couple weeks for the same concerns.  Over the past 2 weeks he has been experiencing all symptoms of depression including active suicidal thoughts.  In addition he experiences severe anxiety including claustrophobia (for example he was in a tent and started panicking from the enclosed nature and was brought the 10 target)  and panic attacks.    Regarding negative psych review of systems, he denies any periods in his life where he had decreased need for sleep, increased activity, and increased/expansive/irritable mood.  He denies any obsessions or compulsions.  He denies hearing voices telling him to end his life or other auditory hallucinations.  He denies that people are conspiring against him, spying on him, or trying harm in any way.  He reports no substance use aside from occasional cigarette use.  He denies any experience in life where he was physically, emotionally, or sexually abused.  Regarding his sleep he has issues falling asleep and staying asleep.  He also reports that he typically does not have much appetite.  Furthermore the patient is experiencing severe pruritus.  See exam for detailed description of the papules.  He reports that he is never experienced this in the past.  He reports seeing no mites, insects, spiders.   Patient reports that we can contact his brother, although the contact he gives is the wrong number.    Past Psychiatric Hx: Previous Psych Diagnoses: MDD, GAD Prior inpatient treatment: Hospitalized 2 weeks ago, hospitalized in early June, hospitalized numerous times in the past Current/prior outpatient treatment: None reported Prior rehab hx: None reported Psychotherapy hx: No history History of suicide: 1 suicide attempt by wrist cutting. History of homicide or aggression: No reported history Psychiatric medication history: Fluoxetine which helped his anxiety, Vistaril which helps with anxiety, Remeron which helped his sleep, no reported medications that he reports poor outcomes from Psychiatric medication compliance history: Issues with nonadherence due to housing and financial instability Neuromodulation history: No  neuromodulation history Current Psychiatrist: No current or past psychiatrist Current therapist: No current or past therapist  Substance Abuse Hx: Alcohol: No  current or past use Tobacco: Occasional use of cigarettes Illicit drugs: No current or past use of illicit substances except occasional cannabis use. Rx drug abuse: No past or current use Rehab hx: No history of substance rehab  Past Medical History: Medical Diagnoses: Migraines treated with Tylenol extra-strength Home Rx: No home meds Prior Hosp: No reported medical hospitalizations Prior Surgeries/Trauma: Reports no surgeries Head trauma, LOC, concussions, seizures: No history of TBI or seizures.  Allergies: No known drug allergies PCP: No current PCP  Family History: Medical: Does not report any medical family history Psych: Does not report any mental health issues or substance use except for MDD and aunt Psych Rx: No reported medication responses SA/HA: No reported family history of SA or HA Substance use family hx: None reported  Social History: Childhood (bring, raised, lives now, parents, siblings, schooling, education): Does not go into details on his childhood but has a very poor relationship with his family currently.  Reports having a brother. Abuse: Does not report any physical, emotional, sexual abuse anytime in life. Marital Status: Single Sexual orientation: Did not report Children: No children Employment: Currently unemployed.  Did not go into detail about his past employment Peer Group: Reports having no social support or peer group. Housing: Also living a house with his previous friends when they abandoned him and he was forced to leave the house. Finances: Significant financial issues Legal: No current legal issues. Military: Did not ask the patient.  Total Time spent with patient: 45 minutes  Is the patient at risk to self? Yes.    Has the patient been a risk to self in the past 6 months? Yes.    Has the patient been a risk to self within the distant past? Yes.    Is the patient a risk to others? No.  Has the patient been a risk to others in the past 6  months? No.  Has the patient been a risk to others within the distant past? No.   [place] Scale:  Flowsheet Row [facility] (Current[facility] Most recent reading at 06/20/2023  5:35 PM ED from 06/20/2023 in [facility] Emergency Department at [facility] Most recent reading at 06/20/2023  7:45 AM ED from 06/03/2023 in [facility] Department at [facility] Most recent reading at 06/03/2023 11:04 AM  C-SSRS RISK CATEGORY High Risk Moderate Risk High Risk        Prior Inpatient Therapy: No.  Prior Outpatient Therapy: No.   Alcohol Screening: 1. How often do you have a drink containing alcohol?: Never 2. How many drinks containing alcohol do you have on a typical day when you are drinking?: 1 or 2 3. How often do you have six or more drinks on one occasion?: Never AUDIT-C Score: 0 4. How often during the last year have you found that you were not able to stop drinking once you had started?: Never 5. How often during the last year have you failed to do what was normally expected from you because of drinking?: Never 6. How often during the last year have you needed a first drink in the morning to get yourself going after a heavy drinking session?: Never 7. How often during the last year have you had a feeling of guilt of remorse after drinking?: Never 8. How  often during the last year have you been unable to remember what happened the night before because you had been drinking?: Never 9. Have you or someone else been injured as a result of your drinking?: No 10. Has a relative or friend or a doctor or another health worker been concerned about your drinking or suggested you cut down?: No Alcohol Use Disorder Identification Test Final Score (AUDIT): 0 Alcohol Brief Interventions/Follow-up: Alcohol education/Brief advice Substance Abuse History in the last 12 months:  No. Consequences of Substance Abuse: NA Previous  Psychotropic Medications: Yes  Psychological Evaluations: No  Past Medical History:  Past Medical History:  Diagnosis Date   Anxiety    Depression    History of admission to inpatient psychiatry department 05/03/2023   Migraine    Suicidal ideation 05/03/2023   History reviewed. No pertinent surgical history. Family History:  Family History  Problem Relation Age of Onset   Cancer Mother        brain   Diabetes Brother    Hypertension Brother     Tobacco Screening:  Social History   Tobacco Use  Smoking Status Every Day   Current packs/day: 1.50   Average packs/day: 1.5 packs/day for 20.0 years (30.0 ttl pk-yrs)   Types: Cigarettes  Smokeless Tobacco Never    BH Tobacco Counseling     Are you interested in Tobacco Cessation Medications?  Yes, implement Nicotene Replacement Protocol Counseled patient on smoking cessation:  Yes Reason Tobacco Screening Not Completed: No value filed.       Social History:  Social History   Substance and Sexual Activity  Alcohol Use No     Social History   Substance and Sexual Activity  Drug Use Not Currently   Comment: denies use in the last month    Additional Social History: Marital status: Single Are you sexually active?: No What is your sexual orientation?: Straight Has your sexual activity been affected by drugs, alcohol, medication, or emotional stress?: N/A Does patient have children?: No       Allergies:  No Known Allergies Lab Results:  Results for orders placed or performed during the hospital encounter of 06/20/23 (from the past 48 hour(s))  Comprehensive metabolic panel     Status: Abnormal   Collection Time: 06/20/23  7:58 AM  Result Value Ref Range   Sodium 135 135 - 145 mmol/L   Potassium 3.8 3.5 - 5.1 mmol/L   Chloride 103 98 - 111 mmol/L   CO2 25 22 - 32 mmol/L   Glucose, Bld 97 70 - 99 mg/dL    Comment: Glucose reference range applies only to samples taken after fasting for at least 8 hours.   BUN  13 6 - 20 mg/dL   Creatinine, Ser 9.62 0.61 - 1.24 mg/dL   Calcium 9.0 8.9 - 95.2 mg/dL   Total Protein 7.6 6.5 - 8.1 g/dL   Albumin 4.2 3.5 - 5.0 g/dL   AST 40 15 - 41 U/L   ALT 98 (H) 0 - 44 U/L   Alkaline Phosphatase 124 38 - 126 U/L   Total Bilirubin 0.6 0.3 - 1.2 mg/dL   GFR, Estimated >84 >13 mL/min    Comment: (NOTE) Calculated using the CKD-EPI Creatinine Equation (2021)    Anion gap 7 5 - 15    Comment: Performed at [facility], [address] [place], [place] [address]  Ethanol     Status: None   Collection Time: 06/20/23  7:58 AM  Result Value Ref  Range   Alcohol, Ethyl (B) <10 <10 mg/dL    Comment: (NOTE) Lowest detectable limit for serum alcohol is 10 mg/dL.  For medical purposes only. Performed at [facility], [address], [place], [place] [address]   Salicylate level     Status: Abnormal   Collection Time: 06/20/23  7:58 AM  Result Value Ref Range   Salicylate Lvl <7.0 (L) 7.0 - 30.0 mg/dL    Comment: Performed at [facility], [address] [place], [place] [address]  Acetaminophen level     Status: Abnormal   Collection Time: 06/20/23  7:58 AM  Result Value Ref Range   Acetaminophen (Tylenol), Serum <10 (L) 10 - 30 ug/mL    Comment: (NOTE) Therapeutic concentrations vary significantly. A range of 10-30 ug/mL  may be an effective concentration for many patients. However, some  are best treated at concentrations outside of this range. Acetaminophen concentrations >150 ug/mL at 4 hours after ingestion  and >50 ug/mL at 12 hours after ingestion are often associated with  toxic reactions.  Performed at [facility], [address], [place], [place] [address]   cbc     Status: None   Collection Time: 06/20/23  7:58 AM  Result Value Ref Range   WBC 8.2 4.0 - 10.5 K/uL   RBC 4.73 4.22 - 5.81 MIL/uL   Hemoglobin 13.9 13.0 - 17.0 g/dL   HCT 91.4 78.2 - 95.6 %   MCV 87.3 80.0 - 100.0 fL   MCH 29.4 26.0 - 34.0 pg   MCHC 33.7 30.0 - 36.0 g/dL   RDW 21.3  08.6 - 57.8 %   Platelets 256 150 - 400 K/uL   nRBC 0.0 0.0 - 0.2 %    Comment: Performed at [facility], [address] [place], [place] [address]  Rapid urine drug screen (hospital performed)     Status: None   Collection Time: 06/20/23 11:35 AM  Result Value Ref Range   Opiates NONE DETECTED NONE DETECTED   Cocaine NONE DETECTED NONE DETECTED   Benzodiazepines NONE DETECTED NONE DETECTED   Amphetamines NONE DETECTED NONE DETECTED   Tetrahydrocannabinol NONE DETECTED NONE DETECTED   Barbiturates NONE DETECTED NONE DETECTED    Comment: (NOTE) DRUG SCREEN FOR MEDICAL PURPOSES ONLY.  IF CONFIRMATION IS NEEDED FOR ANY PURPOSE, NOTIFY LAB WITHIN 5 DAYS.  LOWEST DETECTABLE LIMITS FOR URINE DRUG SCREEN Drug Class                     Cutoff (ng/mL) Amphetamine and metabolites    1000 Barbiturate and metabolites    200 Benzodiazepine                 200 Opiates and metabolites        300 Cocaine and metabolites        300 THC                            50 Performed at [facility], [address] [place], [place] [address]     Blood Alcohol level:  Lab Results  Component Value Date   [facility] <10 06/20/2023   ETH <10 06/03/2023    Metabolic Disorder Labs:  Lab Results  Component Value Date   HGBA1C 5.3 04/30/2023   MPG 105 04/30/2023   MPG 105 08/28/2018   Lab Results  Component Value Date   PROLACTIN 6.7 08/28/2018   PROLACTIN 17.3 (H) 05/17/2017  Lab Results  Component Value Date   CHOL 168 04/30/2023   TRIG 108 04/30/2023   HDL 55 04/30/2023   CHOLHDL 3.1 04/30/2023   VLDL 22 04/30/2023   LDLCALC 91 04/30/2023   LDLCALC 104 (H) 08/28/2018    Current Medications: Current Facility-Administered Medications  Medication Dose Route Frequency Provider Last Rate Last Admin   acetaminophen (TYLENOL) tablet 650 mg  650 mg Oral Q6H PRN Rankin, Shuvon B, NP       alum & mag hydroxide-simeth (MAALOX/MYLANTA) 200-200-20 MG/5ML suspension 30 mL  30 mL Oral Q4H PRN Rankin,  Shuvon B, NP       feeding supplement (ENSURE ENLIVE / ENSURE PLUS) liquid 237 mL  237 mL Oral BID BM Rankin, Shuvon B, NP   237 mL at 06/21/23 1329   FLUoxetine (PROZAC) capsule 40 mg  40 mg Oral Daily Rankin, Shuvon B, NP   40 mg at 06/21/23 1610   hydrOXYzine (ATARAX) tablet 25 mg  25 mg Oral TID Meryl Dare, MD   25 mg at 06/21/23 1329   loratadine (CLARITIN) tablet 10 mg  10 mg Oral Daily Meryl Dare, MD   10 mg at 06/21/23 1259   magnesium hydroxide (MILK OF MAGNESIA) suspension 30 mL  30 mL Oral Daily PRN Rankin, Shuvon B, NP       mirtazapine (REMERON) tablet 7.5 mg  7.5 mg Oral QHS Meryl Dare, MD       nicotine (NICODERM CQ - dosed in mg/24 hours) patch 14 mg  14 mg Transdermal Daily Rankin, Shuvon B, NP   14 mg at 06/21/23 9604   permethrin (ELIMITE) 5 % cream   Topical Once Meryl Dare, MD       traZODone (DESYREL) tablet 50 mg  50 mg Oral QHS Rankin, Shuvon B, NP       PTA Medications: Medications Prior to Admission  Medication Sig Dispense Refill Last Dose   FLUoxetine (PROZAC) 20 MG capsule Take 2 capsules (40 mg total) by mouth daily. (Patient not taking: Reported on 06/03/2023) 60 capsule 3    OLANZapine (ZYPREXA) 10 MG tablet Take 1 tablet (10 mg total) by mouth at bedtime. (Patient not taking: Reported on 06/03/2023) 30 tablet 3    traZODone (DESYREL) 50 MG tablet Take 1 tablet (50 mg total) by mouth at bedtime. (Patient not taking: Reported on 06/03/2023) 30 tablet 3    traZODone (DESYREL) 50 MG tablet Take 1 tablet (50 mg total) by mouth at bedtime as needed for sleep. (Patient not taking: Reported on 06/03/2023) 30 tablet 0     Musculoskeletal: Strength & Muscle Tone: within normal limits Gait & Station: normal Patient leans: N/A    Psychiatric Specialty Exam:  Presentation  General Appearance:  Appropriate for Environment  Eye Contact: Good  Speech: Normal Rate  Speech Volume: Normal  Handedness: Right   Mood and Affect   Mood: Dysphoric  Affect: Depressed; Congruent; Tearful   Thought Process  Thought Processes: Linear  Duration of Psychotic Symptoms:N/A Past Diagnosis of Schizophrenia or Psychoactive disorder: No  Descriptions of Associations:Intact  Orientation:Full (Time, Place and Person)  Thought Content:Logical  Hallucinations:Hallucinations: None  Ideas of Reference:None  Suicidal Thoughts:Suicidal Thoughts: Yes, Active SI Active Intent and/or Plan: Without Plan; With Intent  Homicidal Thoughts:Homicidal Thoughts: No   Sensorium  Memory: Immediate Good; Recent Good; Remote Good  Judgment: Poor  Insight: Fair   Art therapist  Concentration: Fair  Attention Span: Good  Recall: Good  Fund of Knowledge: Good  Language: Good   Psychomotor Activity  Psychomotor Activity: Psychomotor Activity: Normal   Assets  Assets: Desire for Improvement   Sleep  Sleep:Sleep: Fair Number of Hours of Sleep: 8    Physical Exam: Physical Exam Vitals and nursing note reviewed.  Constitutional:      Comments: Patient is in acute distress and aggressively itching his body and visibly uncomfortable.  There are significant scratch marks where his papules are. Patient also appears malnourished.  HENT:     Head: Normocephalic and atraumatic.  Pulmonary:     Effort: Pulmonary effort is normal.  Skin:    Comments: Small 3 to 5 mm pruritic papules not uniformly scattered along the bilateral arms, bilateral legs, back.  Does not involve creases, interdigital spaces, underarms.  No signs of insects.  Neurological:     General: No focal deficit present.     Mental Status: He is alert.    Review of Systems  Constitutional:  Negative for fever.  Cardiovascular:  Negative for chest pain and palpitations.  Gastrointestinal:  Negative for constipation, diarrhea, nausea and vomiting.  Neurological:  Negative for dizziness, weakness and headaches.   Blood pressure  112/79, pulse 95, temperature 98.6 F (37 C), temperature source Oral, resp. rate 16, height 5\' 8"  (1.727 m), weight 71.2 kg, SpO2 98%. Body mass index is 23.87 kg/m.    ASSESSMENT & PLAN  ASSESSMENT:   Diagnoses / Active Problems: MDD (major depressive disorder) GAD Experiencing helplessness (2 months)   "Angel" [name] is a 42 year old male with a past history of DT, GAD, numerous hospitalizations for suicide ideation and attempt who presents to the behavioral health Hospital for all symptoms of depression, severe anxiety, active suicidal thoughts with intention but no plan.  Furthermore the patient is experiencing homelessness for the first time in his life.   Patient currently meets criteria for MDD and GAD.  We restarted fluoxetine, Remeron, scheduled hydroxyzine, all of which he has responded in the past.  Given his intolerable living situation, social work will give patient resources for safe shelters in [place] (fortunately there are no shelters in his home county [place]).  Infectious disease was consulted on the patient and they diagnosed scabies and recommended permethrin cream once today and again in a week.  ID also recommended screening testing for syphilis and HIV.  In addition to the permethrin treatment, we hope that shelter resource will prevent him from returning to his tent and [facility] where he got scabies originally.  Patient has been educated on scabies and information around reinfection and treatment and he voices understanding by teach back.  Given the severity of his current psychiatric conditions and comorbid scabies infection, he will likely need 7 days in the behavioral health Hospital, and this will offer him to receive his second dose of permethrin.  Furthermore the patient is malnourished and will receive ensures between meals.     PLAN: Safety and Monitoring:             -- Involuntary admission to inpatient psychiatric unit for safety,  stabilization and treatment             -- Daily contact with patient to assess and evaluate symptoms and progress in treatment             -- Patient's case to be discussed in multi-disciplinary team meeting             -- Observation Level : q15 minute checks             --  Vital signs:  q12 hours             -- Precautions: suicide, elopement, and assault   2. Psychiatric Diagnoses and Treatment:  -- Started fluoxetine 40 mg once daily for MDD, GAD  -- Mirtazapine 7.5 mg once at night for MDD, insomnia  -- Start hydroxyzine 25 mg as needed 3 times daily for anxiety  -- Start trazodone 50 mg as needed once at night for insomnia --  The risks/benefits/side-effects/alternatives to this medication were discussed in detail with the patient and time was given for questions. The patient consents to medication trial.              -- Metabolic profile and EKG monitoring obtained while on an atypical antipsychotic. See #4 below for values.              -- Encouraged patient to participate in unit milieu and in scheduled group therapies              -- Short Term Goals: Ability to identify changes in lifestyle to reduce recurrence of condition will improve, Ability to verbalize feelings will improve, Ability to disclose and discuss suicidal ideas, Ability to demonstrate self-control will improve, Ability to identify and develop effective coping behaviors will improve, Ability to maintain clinical measurements within normal limits will improve, Compliance with prescribed medications will improve, and Ability to identify triggers associated with substance abuse/mental health issues will improve             -- Long Term Goals: Improvement in symptoms so as ready for discharge                3. Medical Issues Being Addressed:              -- Scabies infestation: Started permethrin topical applied once to the affected areas.  Apply again in 1 week.  -- Migraines: Tylenol as needed available.    4. Routine  and other pertinent labs reviewed: EKG monitoring: QTc: 444  Metabolism / endocrine: BMI: Body mass index is 23.87 kg/m. Prolactin: Lab Results  Component Value Date   PROLACTIN 6.7 08/28/2018   PROLACTIN 17.3 (H) 05/17/2017   Lipid Panel: Lab Results  Component Value Date   CHOL 168 04/30/2023   TRIG 108 04/30/2023   HDL 55 04/30/2023   CHOLHDL 3.1 04/30/2023   VLDL 22 04/30/2023   LDLCALC 91 04/30/2023   LDLCALC 104 (H) 08/28/2018   HbgA1c: Hgb A1c MFr Bld (%)  Date Value  04/30/2023 5.3   TSH: TSH (uIU/mL)  Date Value  08/28/2018 1.582    Labs to order: RPR, HIV  5. Discharge Planning:              -- Social work and case management to assist with discharge planning and identification of hospital follow-up needs prior to discharge             -- Estimated LOS: 7 days             -- Discharge Concerns: Need to establish a safety plan; Medication compliance and effectiveness             -- Discharge Goals: Return home with outpatient referrals for mental health follow-up including medication management/psychotherapy   I certify that inpatient services furnished can reasonably be expected to improve the patient's condition.   Meryl Dare, MD 7/25/20243:54 PM

## 2023-06-21 NOTE — Progress Notes (Addendum)
Pt's belongings in locker 34 were double bagged and a note was placed on the door indicating possible infestation.     Pt restricted to room due to possible insect infestation. Contamination precaution sign placed on door.

## 2023-06-21 NOTE — Plan of Care (Signed)
  Problem: Education: Goal: Knowledge of [place] [facility] Education information/materials will improve Outcome: Progressing Goal: Verbalization of understanding the information provided will improve Outcome: Progressing   Problem: Activity: Goal: Sleeping patterns will improve Outcome: Progressing   Problem: Coping: Goal: Ability to verbalize frustrations and anger appropriately will improve Outcome: Progressing   Problem: Physical Regulation: Goal: Ability to maintain clinical measurements within normal limits will improve Outcome: Progressing   Problem: Safety: Goal: Periods of time without injury will increase Outcome: Progressing

## 2023-06-21 NOTE — Progress Notes (Signed)
Pt is awake and alert.  Flat affect and depressed mood.  Pt has poor eye contact and is disheveled and malodorous.  Pt complains of "rash and itching" on rt arm and Rt leg.   Pt has noted splotchy pimple like rash on both extremities.  Pt  reports "some" thoughts of self harm but doesn't want to elaborate.  Denies SI or AVH remains in room.  MD made aware.

## 2023-06-22 ENCOUNTER — Other Ambulatory Visit: Payer: Self-pay

## 2023-06-22 ENCOUNTER — Emergency Department (HOSPITAL_COMMUNITY): Payer: Medicaid Other

## 2023-06-22 ENCOUNTER — Emergency Department (HOSPITAL_COMMUNITY)
Admission: EM | Admit: 2023-06-22 | Disposition: A | Discharge status: Home / Self Care | Payer: Medicaid Other | Source: Home / Self Care | Attending: Emergency Medicine | Admitting: Emergency Medicine

## 2023-06-22 DIAGNOSIS — R1031 Right lower quadrant pain: Secondary | ICD-10-CM | POA: Insufficient documentation

## 2023-06-22 DIAGNOSIS — F332 Major depressive disorder, recurrent severe without psychotic features: Secondary | ICD-10-CM | POA: Diagnosis not present

## 2023-06-22 DIAGNOSIS — R109 Unspecified abdominal pain: Secondary | ICD-10-CM

## 2023-06-22 LAB — BASIC METABOLIC PANEL
Anion gap: 10 (ref 5–15)
BUN: 19 mg/dL (ref 6–20)
CO2: 26 mmol/L (ref 22–32)
Calcium: 9 mg/dL (ref 8.9–10.3)
Chloride: 101 mmol/L (ref 98–111)
Creatinine, Ser: 0.81 mg/dL (ref 0.61–1.24)
GFR, Estimated: >60 mL/min (ref >60)
Glucose, Bld: 89 mg/dL (ref 70–99)
Potassium: 4.1 mmol/L (ref 3.5–5.1)
Sodium: 137 mmol/L (ref 135–145)

## 2023-06-22 LAB — URINALYSIS, ROUTINE W REFLEX MICROSCOPIC
Bilirubin Urine: NEGATIVE
Glucose, UA: NEGATIVE mg/dL
Hgb urine dipstick: NEGATIVE
Ketones, ur: NEGATIVE mg/dL
Leukocytes,Ua: NEGATIVE
Nitrite: NEGATIVE
Protein, ur: NEGATIVE mg/dL
Specific Gravity, Urine: 1.024 (ref 1.005–1.030)
pH: 6 (ref 5.0–8.0)

## 2023-06-22 LAB — CBC
HCT: 42.1 % (ref 39.0–52.0)
Hemoglobin: 14.1 g/dL (ref 13.0–17.0)
MCH: 29.4 pg (ref 26.0–34.0)
MCHC: 33.5 g/dL (ref 30.0–36.0)
MCV: 87.7 fL (ref 80.0–100.0)
Platelets: 244 10*3/uL (ref 150–400)
RBC: 4.8 MIL/uL (ref 4.22–5.81)
RDW: 13.4 % (ref 11.5–15.5)
WBC: 7.7 10*3/uL (ref 4.0–10.5)
nRBC: 0 % (ref 0.0–0.2)

## 2023-06-22 MED ORDER — PERMETHRIN 5 % EX CREA
TOPICAL_CREAM | Freq: Once | CUTANEOUS | Status: DC
  Filled 2023-06-22: qty 60

## 2023-06-22 NOTE — ED Provider Notes (Signed)
[place] EMERGENCY DEPARTMENT AT [facility] Provider Note   CSN: [id number] Arrival date & time: 06/22/23  1027     History  Chief Complaint  Patient presents with   Flank Pain    Angel [name] is a 42 y.o. male.  42 year old male presents for right-sided flank pain.  States began yesterday has been atraumatic.  States pain is sharp and worse with certain movement.  Denies any urinary issues such as hematuria or dysuria.  No fever or chills.  Pain does not radiate to his right lower quadrant.  No prior history of same.  No treatment use for this prior to arrival       Home Medications Prior to Admission medications   Medication Sig Start Date End Date Taking? Authorizing Provider  FLUoxetine (PROZAC) 20 MG capsule Take 2 capsules (40 mg total) by mouth daily. Patient not taking: Reported on 06/03/2023 05/31/23 05/30/24  Charm Rings, NP  OLANZapine (ZYPREXA) 10 MG tablet Take 1 tablet (10 mg total) by mouth at bedtime. Patient not taking: Reported on 06/03/2023 05/29/23   Sarina Ill, DO  traZODone (DESYREL) 50 MG tablet Take 1 tablet (50 mg total) by mouth at bedtime. Patient not taking: Reported on 06/03/2023 05/29/23 06/28/23  Sarina Ill, DO  traZODone (DESYREL) 50 MG tablet Take 1 tablet (50 mg total) by mouth at bedtime as needed for sleep. Patient not taking: Reported on 06/03/2023 05/31/23 06/30/23  Charm Rings, NP      Allergies    Patient has no known allergies.    Review of Systems   Review of Systems  All other systems reviewed and are negative.   Physical Exam Updated Vital Signs BP 103/76   Pulse 77   Temp 97.8 F (36.6 C)   Resp 16   Ht 1.727 m (5\' 8" )   Wt 71.7 kg   SpO2 98%   BMI 24.02 kg/m  Physical Exam Vitals and nursing note reviewed.  Constitutional:      General: He is not in acute distress.    Appearance: Normal appearance. He is well-developed. He is not toxic-appearing.  HENT:     Head: Normocephalic and  atraumatic.  Eyes:     General: Lids are normal.     Conjunctiva/sclera: Conjunctivae normal.     Pupils: Pupils are equal, round, and reactive to light.  Neck:     Thyroid: No thyroid mass.     Trachea: No tracheal deviation.  Cardiovascular:     Rate and Rhythm: Normal rate and regular rhythm.     Heart sounds: Normal heart sounds. No murmur heard.    No gallop.  Pulmonary:     Effort: Pulmonary effort is normal. No respiratory distress.     Breath sounds: Normal breath sounds. No stridor. No decreased breath sounds, wheezing, rhonchi or rales.  Abdominal:     General: There is no distension.     Palpations: Abdomen is soft.     Tenderness: There is no abdominal tenderness. There is no guarding or rebound.  Musculoskeletal:        General: No tenderness. Normal range of motion.     Cervical back: Normal range of motion and neck supple.       Back:  Skin:    General: Skin is warm and dry.     Findings: No abrasion or rash.  Neurological:     Mental Status: He is alert and oriented to person, place, and time.  Mental status is at baseline.     GCS: GCS eye subscore is 4. GCS verbal subscore is 5. GCS motor subscore is 6.     Cranial Nerves: Cranial nerves are intact. No cranial nerve deficit.     Sensory: No sensory deficit.     Motor: Motor function is intact.  Psychiatric:        Attention and Perception: Attention normal.        Speech: Speech normal.        Behavior: Behavior normal.     ED Results / Procedures / Treatments   Labs (all labs ordered are listed, but only abnormal results are displayed) Labs Reviewed  CBC  URINALYSIS, ROUTINE W REFLEX MICROSCOPIC  BASIC METABOLIC PANEL    EKG None  Radiology No results found.  Procedures Procedures    Medications Ordered in ED Medications - No data to display  ED Course/ Medical Decision Making/ A&P                             Medical Decision Making Amount and/or Complexity of Data Reviewed Labs:  ordered. Radiology: ordered.   Urinalysis negative for infection.  Patient's renal CT negative for kidney stone.  Pain is reproducible and suspect musculoskeletal etiology.  Will send back to behavioral health hospital.        Final Clinical Impression(s) / ED Diagnoses Final diagnoses:  None    Rx / DC Orders ED Discharge Orders     None         Lorre Nick, MD 06/22/23 1506

## 2023-06-22 NOTE — ED Notes (Deleted)
Pt reported RTQ sharp pain.

## 2023-06-22 NOTE — ED Triage Notes (Addendum)
 Pt arrives from [facility] via [facility] for c/o rt flank pain since last night. Pt denies GU s/s. Is voluntary at [facility]. Still endorses SI. Reportedly has scabies.

## 2023-06-22 NOTE — B[facility] Group Notes (Signed)
Adult Psychoeducational Group Note  Date:  06/22/2023 Time:  10:24 AM  Group Topic/Focus:  Goals Group:   The focus of this group is to help patients establish daily goals to achieve during treatment and discuss how the patient can incorporate goal setting into their daily lives to aide in recovery. Orientation:   The focus of this group is to educate the patient on the purpose and policies of crisis stabilization and provide a format to answer questions about their admission.  The group details unit policies and expectations of patients while admitted.  Participation Level:  Did Not Attend  Participation Quality:    Affect:    Cognitive:    Insight:   Engagement in Group:    Modes of Intervention:    Additional Comments:    Sheran Lawless 06/22/2023, 10:24 AM

## 2023-06-22 NOTE — ED Triage Notes (Deleted)
Pt arrives from [facility] via [facility] for c/o rt flank pain since last night. Pt denies GU s/s. Is voluntary at [facility]. Still endorses SI. Reportedly has scabies.

## 2023-06-22 NOTE — Progress Notes (Addendum)
[facility] MD Progress Note  06/22/2023 9:31 AM Angel [name]  MRN:  [id number] Subjective:   Natale Lay" Rugar is a 42 year old male with past psychiatric history of MDD, GAD; psychiatric hospitalization 2 weeks ago (SI) and numerous in the past (SI/SA); history of self-harm and 1 suicide attempt by wrist cutting.    Case was discussed in the multidisciplinary team. MAR was reviewed and patient is compliant with medications.  Received acetaminophen yesterday and this morning.  Received permethrin cream treatment yesterday.  Also received 0.5% hydrocortisone cream prior to permethrin treatment to help with severe pruritus.   Psychiatric Team made the following recommendations yesterday: -- Started fluoxetine 40 mg once daily for MDD, GAD             -- Started mirtazapine 7.5 mg once at night for MDD, insomnia             -- Started hydroxyzine 25 mg as needed 3 times daily for anxiety             -- Started trazodone 50 mg as needed once at night for insomnia  -- Started loratadine 10 mg once daily for acute pruritus.   On review today patient reports having significant right flank pain.  Reports no improvement in his depressive symptoms, especially in the context of his new acute pain, and that he is still having active suicidal thoughts without plan or intent.  He reports no side effects to the medications.  He reports that he slept poor due to the pain throughout the night and the itching from scabies.  He does report issues with his appetite due to the right flank pain but does not associate the pain with eating.  No significant side effects to psychiatric medications.   Principal Problem: MDD (major depressive disorder) Diagnosis: Principal Problem:   MDD (major depressive disorder) Active Problems:   Generalized anxiety disorder   Housing instability after recent homelessness   Scabies  Total Time spent with patient: 15 minutes  Past Psychiatric History: MDD, GAD, numerous  hospitalizations for suicidal thoughts, severe history of self-harm by cutting and suicide attempt by wrist cutting.  Past Medical History:  Past Medical History:  Diagnosis Date   Anxiety    Depression    History of admission to inpatient psychiatry department 05/03/2023   Migraine    Suicidal ideation 05/03/2023   History reviewed. No pertinent surgical history. Family History:  Family History  Problem Relation Age of Onset   Cancer Mother        brain   Diabetes Brother    Hypertension Brother    Family Psychiatric  History: No reported mental health conditions or substance use in the family except for MDD in second-degree relative. Social History:  Social History   Substance and Sexual Activity  Alcohol Use No     Social History   Substance and Sexual Activity  Drug Use Not Currently   Comment: denies use in the last month    Social History   Socioeconomic History   Marital status: Single    Spouse name: Not on file   Number of children: Not on file   Years of education: Not on file   Highest education level: Not on file  Occupational History   Not on file  Tobacco Use   Smoking status: Every Day    Current packs/day: 1.50    Average packs/day: 1.5 packs/day for 20.0 years (30.0 ttl pk-yrs)    Types: Cigarettes  Smokeless tobacco: Never  Vaping Use   Vaping status: Never Used  Substance and Sexual Activity   Alcohol use: No   Drug use: Not Currently    Comment: denies use in the last month   Sexual activity: Not Currently    Birth control/protection: Condom  Other Topics Concern   Not on file  Social History Narrative   Not on file   Social Determinants of Health   Financial Resource Strain: Not on file  Food Insecurity: Food Insecurity Present (06/20/2023)   Hunger Vital Sign    Worried About Running Out of Food in the Last Year: Often true    Ran Out of Food in the Last Year: Often true  Transportation Needs: Unmet Transportation Needs  (06/20/2023)   PRAPARE - Administrator, Civil Service (Medical): Yes    Lack of Transportation (Non-Medical): Yes  Physical Activity: Not on file  Stress: Not on file  Social Connections: Not on file   Additional Social History:   Childhood (bring, raised, lives now, parents, siblings, schooling, education): Does not go into details on his childhood but has a very poor relationship with his family currently.  Reports having a brother. Abuse: Does not report any physical, emotional, sexual abuse anytime in life. Marital Status: Single Sexual orientation: Did not report Children: No children Employment: Currently unemployed.  Did not go into detail about his past employment Peer Group: Reports having no social support or peer group. Housing: Also living a house with his previous friends when they abandoned him and he was forced to leave the house. Finances: Significant financial issues Legal: No current legal issues. Military: Did not ask the patient.    Sleep: Poor secondary to itching and pain throughout the night.  Appetite:  Fair  Current Medications: Current Facility-Administered Medications  Medication Dose Route Frequency Provider Last Rate Last Admin   acetaminophen (TYLENOL) tablet 650 mg  650 mg Oral Q6H PRN Rankin, Shuvon B, NP   650 mg at 06/22/23 0919   alum & mag hydroxide-simeth (MAALOX/MYLANTA) 200-200-20 MG/5ML suspension 30 mL  30 mL Oral Q4H PRN Rankin, Shuvon B, NP       feeding supplement (ENSURE ENLIVE / ENSURE PLUS) liquid 237 mL  237 mL Oral BID BM Rankin, Shuvon B, NP   237 mL at 06/21/23 1329   FLUoxetine (PROZAC) capsule 40 mg  40 mg Oral Daily Rankin, Shuvon B, NP   40 mg at 06/22/23 1610   hydrOXYzine (ATARAX) tablet 25 mg  25 mg Oral TID Meryl Dare, MD   25 mg at 06/22/23 0906   loratadine (CLARITIN) tablet 10 mg  10 mg Oral Daily Meryl Dare, MD   10 mg at 06/22/23 9604   magnesium hydroxide (MILK OF MAGNESIA) suspension 30 mL  30 mL  Oral Daily PRN Rankin, Shuvon B, NP       mirtazapine (REMERON) tablet 7.5 mg  7.5 mg Oral QHS Meryl Dare, MD   7.5 mg at 06/21/23 2148   nicotine (NICODERM CQ - dosed in mg/24 hours) patch 14 mg  14 mg Transdermal Daily Rankin, Shuvon B, NP   14 mg at 06/21/23 0808   [START ON 06/28/2023] permethrin (ELIMITE) 5 % cream   Topical Once Massengill, Harrold Donath, MD       traZODone (DESYREL) tablet 50 mg  50 mg Oral QHS Rankin, Shuvon B, NP   50 mg at 06/21/23 2148    Lab Results:  Results for orders placed or performed  during the hospital encounter of 06/20/23 (from the past 48 hour(s))  Rapid urine drug screen (hospital performed)     Status: None   Collection Time: 06/20/23 11:35 AM  Result Value Ref Range   Opiates NONE DETECTED NONE DETECTED   Cocaine NONE DETECTED NONE DETECTED   Benzodiazepines NONE DETECTED NONE DETECTED   Amphetamines NONE DETECTED NONE DETECTED   Tetrahydrocannabinol NONE DETECTED NONE DETECTED   Barbiturates NONE DETECTED NONE DETECTED    Comment: (NOTE) DRUG SCREEN FOR MEDICAL PURPOSES ONLY.  IF CONFIRMATION IS NEEDED FOR ANY PURPOSE, NOTIFY LAB WITHIN 5 DAYS.  LOWEST DETECTABLE LIMITS FOR URINE DRUG SCREEN Drug Class                     Cutoff (ng/mL) Amphetamine and metabolites    1000 Barbiturate and metabolites    200 Benzodiazepine                 200 Opiates and metabolites        300 Cocaine and metabolites        300 THC                            50 Performed at [facility], [address], [place], [place] [address]     Blood Alcohol level:  Lab Results  Component Value Date   [facility] <10 06/20/2023   ETH <10 06/03/2023    Metabolic Disorder Labs: Lab Results  Component Value Date   HGBA1C 5.3 04/30/2023   MPG 105 04/30/2023   MPG 105 08/28/2018   Lab Results  Component Value Date   PROLACTIN 6.7 08/28/2018   PROLACTIN 17.3 (H) 05/17/2017   Lab Results  Component Value Date   CHOL 168 04/30/2023   TRIG 108 04/30/2023   HDL 55  04/30/2023   CHOLHDL 3.1 04/30/2023   VLDL 22 04/30/2023   LDLCALC 91 04/30/2023   LDLCALC 104 (H) 08/28/2018    Musculoskeletal: Strength & Muscle Tone: within normal limits Gait & Station: normal Patient leans: N/A  Psychiatric Specialty Exam:  Presentation  General Appearance:  Appropriate for Environment  Eye Contact: Good  Speech: Normal Rate  Speech Volume: Normal  Handedness: Right   Mood and Affect  Mood: Depressed; Dysphoric  Affect: Appropriate; Congruent; Depressed   Thought Process  Thought Processes: Coherent  Descriptions of Associations:Intact  Orientation:Full (Time, Place and Person)  Thought Content:Logical  History of Schizophrenia/Schizoaffective disorder:No  Duration of Psychotic Symptoms:No data recorded Hallucinations:Hallucinations: None  Ideas of Reference:None  Suicidal Thoughts:Suicidal Thoughts: Yes, Active SI Active Intent and/or Plan: Without Plan; With Intent  Homicidal Thoughts:Homicidal Thoughts: No   Sensorium  Memory: Immediate Fair; Recent Fair; Remote Fair  Judgment: Poor  Insight: Fair   Art therapist  Concentration: Good  Attention Span: Good  Recall: Good  Fund of Knowledge: Good  Language: Good   Psychomotor Activity  Psychomotor Activity: Psychomotor Activity: Normal   Assets  Assets: Desire for Improvement   Sleep  Sleep: Sleep: Fair Number of Hours of Sleep: 8    Physical Exam: Constitutional:      General: He is in acute distress.  Abdominal:     General: Abdomen is flat. There is no distension.     Palpations: Abdomen is rigid.     Tenderness: There is abdominal tenderness. There is rebound. Positive signs include McBurney's sign.     Comments: Severe R flank pain with any ROM  and to palpation over R lower and upper quadrant, especially over mcburney's point.   Skin:    Comments: Numerous 3 to 5 mm papules throughout the arms back axillae and legs   Physical Exam Neurological:     General: No focal deficit present.    ROS Review of Systems  Constitutional:  Negative for chills and fever.  Gastrointestinal:  Positive for abdominal pain. Negative for constipation, diarrhea, nausea and vomiting.  Genitourinary:  Positive for flank pain. Negative for dysuria and hematuria.  Blood pressure 103/68, pulse 88, temperature 98 F (36.7 C), temperature source Oral, resp. rate 16, height 5\' 8"  (1.727 m), weight 71.2 kg, SpO2 98%. Body mass index is 23.87 kg/m.    ASSESSMENT & PLAN   ASSESSMENT:   Diagnoses / Active Problems: MDD (major depressive disorder) GAD Experiencing helplessness (2 months)   "Mikey" Lyu is a 42 year old male with a past history of DT, GAD, numerous hospitalizations for suicide ideation and attempt who presents to the behavioral health Hospital for all symptoms of depression, severe anxiety, active suicidal thoughts with intention but no plan.  Furthermore the patient is experiencing homelessness for the first time in his life.   Patient currently meets criteria for MDD and GAD.  We restarted fluoxetine, Remeron, scheduled hydroxyzine, all of which he has responded in the past.  Given his intolerable living situation, social work will give patient resources for safe shelters in [place] (fortunately there are no shelters in his home county [place]).  Infectious disease was consulted on the patient and they diagnosed scabies and recommended permethrin cream once today and again in a week.  ID also recommended screening testing for syphilis and HIV.  In addition to the permethrin treatment, we hope that shelter resource will prevent him from returning to his tent and [facility] where he got scabies originally.  Patient has been educated on scabies and information around reinfection and treatment and he voices understanding by teach back.  Given the severity of his current psychiatric conditions and  comorbid scabies infection, he will likely need 7 days in the behavioral health Hospital, and this will offer him to receive his second dose of permethrin.  Furthermore the patient is malnourished and will receive ensures between meals.  7/26: Patient continues to have depressive symptoms and active suicidal thoughts.  That he was experiencing right flank pain concerning for acute abdomen, particularly appendicitis, and he will be worked up at the ED (see "plan of care" note for details).  Continues to have significant itching from his scabies infestation after receiving permethrin cream yesterday.  No changes to psychiatric medications.  Discharge plan for August 1 when he could receive his second permethrin treatment.  Syphilis screen negative; HIV still in process.   PLAN: Safety and Monitoring:             -- Involuntary admission to inpatient psychiatric unit for safety, stabilization and treatment             -- Daily contact with patient to assess and evaluate symptoms and progress in treatment             -- Patient's case to be discussed in multi-disciplinary team meeting             -- Observation Level : q15 minute checks             -- Vital signs:  q12 hours             --  Precautions: suicide, elopement, and assault   2. Psychiatric Diagnoses and Treatment:  -- Continue fluoxetine 40 mg once daily for MDD, GAD             -- Continue mirtazapine 7.5 mg once at night for MDD, insomnia             -- Continue hydroxyzine 25 mg as needed 3 times daily for anxiety             -- Continue trazodone 50 mg as needed once at night for insomnia --  The risks/benefits/side-effects/alternatives to this medication were discussed in detail with the patient and time was given for questions. The patient consents to medication trial.              -- Metabolic profile and EKG monitoring obtained while on an atypical antipsychotic. See #4 below for values.              -- Encouraged patient to  participate in unit milieu and in scheduled group therapies              -- Short Term Goals: Ability to identify changes in lifestyle to reduce recurrence of condition will improve, Ability to verbalize feelings will improve, Ability to disclose and discuss suicidal ideas, Ability to demonstrate self-control will improve, Ability to identify and develop effective coping behaviors will improve, Ability to maintain clinical measurements within normal limits will improve, Compliance with prescribed medications will improve, and Ability to identify triggers associated with substance abuse/mental health issues will improve             -- Long Term Goals: Improvement in symptoms so as ready for discharge                3. Medical Issues Being Addressed:              -- Scabies infestation: 1st permethrin cream on 7/25. 2nd permethrin cream on 8/1 (or earlier potential if discharged before).  Continue loratadine 10 mg once daily for acute pruritus.             -- Migraines: Tylenol as needed available.  -- Acute R flank pain: See 7/26 "Plan of care" note for details.      4. Routine and other pertinent labs reviewed:  RPR 7/26: Nonreactive HIV 7/26: (In process)  EKG monitoring: QTc: 444   Metabolism / endocrine: BMI: Body mass index is 23.87 kg/m. Prolactin: Recent Labs       Lab Results  Component Value Date    PROLACTIN 6.7 08/28/2018    PROLACTIN 17.3 (H) 05/17/2017      Lipid Panel: Recent Labs       Lab Results  Component Value Date    CHOL 168 04/30/2023    TRIG 108 04/30/2023    HDL 55 04/30/2023    CHOLHDL 3.1 04/30/2023    VLDL 22 04/30/2023    LDLCALC 91 04/30/2023    LDLCALC 104 (H) 08/28/2018      HbgA1c: Last Labs     Hgb A1c MFr Bld (%)  Date Value  04/30/2023 5.3      TSH: Last Labs     TSH (uIU/mL)  Date Value  08/28/2018 1.582        Labs to order: No current labs.   5. Discharge Planning:              -- Social work and case management to  assist with discharge planning and identification  of hospital follow-up needs prior to discharge             -- Estimated LOS: 8/1, could receive second permethrin treatment at this time.             -- Discharge Concerns: Need to establish a safety plan; Medication compliance and effectiveness             -- Discharge Goals: Return home with outpatient referrals for mental health follow-up including medication management/psychotherapy   I certify that inpatient services furnished can reasonably be expected to improve the patient's condition.   Meryl Dare, MD 06/22/2023, 9:31 AM

## 2023-06-22 NOTE — Plan of Care (Signed)
  Problem: Education: Goal: Knowledge of [facility] Education information/materials will improve Outcome: Progressing   Problem: Activity: Goal: Interest or engagement in activities will improve Outcome: Progressing   Problem: Coping: Goal: Ability to verbalize frustrations and anger appropriately will improve Outcome: Progressing   

## 2023-06-22 NOTE — Progress Notes (Signed)
Pt left via EMS at this time.  

## 2023-06-22 NOTE — Progress Notes (Signed)
   06/22/23 0000  Psych Admission Type (Psych Patients Only)  Admission Status Voluntary  Psychosocial Assessment  Patient Complaints Anxiety;Isolation  Eye Contact Fair  Facial Expression Sad  Affect Depressed  Speech Logical/coherent  Interaction Assertive  Motor Activity Slow  Appearance/Hygiene In scrubs;Poor hygiene;Disheveled  Behavior Characteristics Cooperative;Appropriate to situation  Mood Depressed  Thought Process  Coherency WDL  Content WDL  Delusions None reported or observed  Perception WDL  Hallucination None reported or observed  Judgment Poor  Confusion None  Danger to Self  Current suicidal ideation? Denies  Self-Injurious Behavior No self-injurious ideation or behavior indicators observed or expressed   Agreement Not to Harm Self Yes  Description of Agreement verbal  Danger to Others  Danger to Others None reported or observed

## 2023-06-22 NOTE — Plan of Care (Signed)
Subjective: Olliver Vastine is 25 M with PMHx of migraines and no surgical history who has developed R flank pain since last night. Pain is severe, in the right flank area, worsened with movement, improved with laying still, not associated with any other symptoms.  Has never experienced this pain in the past. He is been urinating last night and this morning. Pain is not in relation to meals.   Review of Systems  Constitutional:  Negative for chills and fever.  Gastrointestinal:  Positive for abdominal pain. Negative for constipation, diarrhea, nausea and vomiting.  Genitourinary:  Positive for flank pain. Negative for dysuria and hematuria.    Objective:  Today's Vitals   06/21/23 2109 06/21/23 2300 06/22/23 0603 06/22/23 0604  BP: 123/81  114/79 103/68  Pulse: 72  84 88  Resp:      Temp: 98.3 F (36.8 C)  98 F (36.7 C)   TempSrc: Oral  Oral   SpO2: 99%  98%   Weight:      Height:      PainSc:  0-No pain     Body mass index is 23.87 kg/m.   Physical Exam Constitutional:      General: He is in acute distress.  Abdominal:     General: Abdomen is flat. There is no distension.     Palpations: Abdomen is rigid.     Tenderness: There is abdominal tenderness. There is rebound. Positive signs include McBurney's sign.     Comments: Severe R flank pain with any ROM and to palpation over R lower and upper quadrant, especially over mcburney's point.   Skin:    Comments: Numerous 3 to 5 mm papules throughout the arms back axillae and legs    Assessment:  Concern for appendicitis and other causes of acute abdomen.   Plan:  Handoff given to ED physician Dr. Donnald Garre and patient has been accepted for workup for appendicitis and other acute causes.  Patient can return to behavioral health Hospital once he is medical cleared.

## 2023-06-22 NOTE — ED Notes (Signed)
 Pt reported RTQ sharp pain.

## 2023-06-23 ENCOUNTER — Encounter (HOSPITAL_COMMUNITY): Payer: Self-pay

## 2023-06-23 DIAGNOSIS — F332 Major depressive disorder, recurrent severe without psychotic features: Secondary | ICD-10-CM | POA: Diagnosis not present

## 2023-06-23 MED ORDER — FLUOXETINE HCL 20 MG PO CAPS
40.0000 mg | ORAL_CAPSULE | Freq: Every day | ORAL | Status: DC
  Administered 2023-06-23 – 2023-06-26 (×4): 40 mg via ORAL
  Filled 2023-06-23 (×6): qty 2

## 2023-06-23 MED ORDER — LORAZEPAM 1 MG PO TABS: 2.0000 mg | ORAL_TABLET | ORAL | Status: DC | PRN

## 2023-06-23 MED ORDER — HYDROCORTISONE 1 % EX CREA
TOPICAL_CREAM | Freq: Two times a day (BID) | CUTANEOUS | Status: DC
  Administered 2023-06-23 – 2023-07-03 (×9): 1 via TOPICAL
  Filled 2023-06-23 (×5): qty 28

## 2023-06-23 MED ORDER — BENZTROPINE MESYLATE 1 MG PO TABS: 1.0000 mg | ORAL_TABLET | ORAL | Status: DC | PRN

## 2023-06-23 MED ORDER — HYDROXYZINE HCL 25 MG PO TABS
25.0000 mg | ORAL_TABLET | Freq: Three times a day (TID) | ORAL | Status: DC | PRN
  Administered 2023-06-23 – 2023-07-03 (×15): 25 mg via ORAL
  Filled 2023-06-23 (×14): qty 1

## 2023-06-23 MED ORDER — ALUM & MAG HYDROXIDE-SIMETH 200-200-20 MG/5ML PO SUSP: 30.0000 mL | ORAL | Status: DC | PRN

## 2023-06-23 MED ORDER — BENZTROPINE MESYLATE 1 MG/ML IJ SOLN: 1.0000 mg | INTRAMUSCULAR | Status: DC | PRN

## 2023-06-23 MED ORDER — TRAZODONE HCL 50 MG PO TABS
50.0000 mg | ORAL_TABLET | Freq: Every evening | ORAL | Status: DC | PRN
  Administered 2023-06-23: 50 mg via ORAL
  Filled 2023-06-23: qty 1

## 2023-06-23 MED ORDER — MAGNESIUM HYDROXIDE 400 MG/5ML PO SUSP: 30.0000 mL | Freq: Every day | ORAL | Status: DC | PRN

## 2023-06-23 MED ORDER — MIRTAZAPINE 7.5 MG PO TABS
7.5000 mg | ORAL_TABLET | Freq: Every day | ORAL | Status: DC
  Administered 2023-06-23: 7.5 mg via ORAL
  Filled 2023-06-23 (×2): qty 1

## 2023-06-23 MED ORDER — HALOPERIDOL 5 MG PO TABS: 5.0000 mg | ORAL_TABLET | ORAL | Status: DC | PRN

## 2023-06-23 MED ORDER — DIPHENHYDRAMINE HCL 25 MG PO CAPS: 50.0000 mg | ORAL_CAPSULE | Freq: Every evening | ORAL | Status: DC | PRN

## 2023-06-23 MED ORDER — LORATADINE 10 MG PO TABS
10.0000 mg | ORAL_TABLET | Freq: Every day | ORAL | Status: DC
  Administered 2023-06-23 – 2023-06-29 (×7): 10 mg via ORAL
  Filled 2023-06-23 (×9): qty 1

## 2023-06-23 MED ORDER — HALOPERIDOL LACTATE 5 MG/ML IJ SOLN: 5.0000 mg | INTRAMUSCULAR | Status: DC | PRN

## 2023-06-23 MED ORDER — ACETAMINOPHEN 325 MG PO TABS: 650.0000 mg | ORAL_TABLET | Freq: Four times a day (QID) | ORAL | Status: DC | PRN

## 2023-06-23 MED ORDER — LORAZEPAM 2 MG/ML IJ SOLN: 2.0000 mg | INTRAMUSCULAR | Status: DC | PRN

## 2023-06-23 MED ORDER — HYDROCODONE-ACETAMINOPHEN 5-325 MG PO TABS
1.0000 | ORAL_TABLET | Freq: Once | ORAL | Status: AC
  Administered 2023-06-23: 1 via ORAL
  Filled 2023-06-23: qty 1

## 2023-06-23 NOTE — [facility] Group Notes (Signed)
[facility] Group Notes:  (Nursing/MHT/Case Management/Adjunct)  Date:  06/23/2023  Time:  2:07 PM  Type of Therapy:  Psychoeducational Skills  Participation Level:  Did Not Attend  Participation Quality:   na  Affect:   na  Cognitive:   na  Insight:  None  Engagement in Group:   na  Modes of Intervention:   na  Summary of Progress/Problems: A podcast from mental health professional Mel Sherral Hammers '' how to help your anxiety '' was played during group. Pt did not attend.  Angel [name] 06/23/2023, 2:07 PM

## 2023-06-23 NOTE — Progress Notes (Addendum)
Patient complaining of severe pruritus- and appears that small papules have spread. Photo taken with unit phone- provider made aware. Contact Precautions continued

## 2023-06-23 NOTE — ED Notes (Signed)
 Attempted 1x to call report to [facility], no answer at this time.

## 2023-06-23 NOTE — [facility] Group Notes (Signed)
Pt did not attend am Goals/Community meeting group.

## 2023-06-23 NOTE — ED Notes (Deleted)
Attempted 1x to call report to [facility], no answer at this time.

## 2023-06-23 NOTE — ED Notes (Signed)
 Nurse at [facility] unable to take report at this time, will call back when available.

## 2023-06-23 NOTE — ED Notes (Signed)
 Pt was given a breakfast tray, currently eating.

## 2023-06-23 NOTE — ED Notes (Deleted)
Nurse at [facility] unable to take report at this time, will call back when available.

## 2023-06-23 NOTE — Progress Notes (Signed)
   06/23/23 1100  Psych Admission Type (Psych Patients Only)  Admission Status Voluntary  Psychosocial Assessment  Patient Complaints Anxiety;Depression  Eye Contact Fair  Facial Expression Anxious;Sad  Affect Anxious;Depressed  Speech Logical/coherent  Interaction Assertive  Motor Activity Slow  Appearance/Hygiene Poor hygiene;Disheveled  Behavior Characteristics Cooperative;Agitated;Anxious  Mood Anxious;Depressed  Aggressive Behavior  Effect No apparent injury  Thought Process  Coherency WDL  Content WDL  Delusions None reported or observed  Perception WDL  Hallucination None reported or observed  Judgment Poor  Confusion None  Danger to Self  Current suicidal ideation? Denies  Self-Injurious Behavior No self-injurious ideation or behavior indicators observed or expressed   Agreement Not to Harm Self Yes  Description of Agreement agreed to contact staff before acting on harmful thoughts  Danger to Others  Danger to Others None reported or observed

## 2023-06-23 NOTE — Progress Notes (Signed)
[facility] MD Progress Note  06/23/2023 1:33 PM Angel [name]  MRN:  [id number] Subjective:   Angel Shortell" Bonton is a 42 year old male with past psychiatric history of MDD, GAD; psychiatric hospitalization 2 weeks ago (SI) and numerous in the past (SI/SA); history of self-harm and 1 suicide attempt by wrist cutting.    Case was discussed in the multidisciplinary team. MAR was reviewed and patient is compliant with medications.  Received acetaminophen yesterday and this morning.  Received permethrin cream treatment yesterday.  Also received 0.5% hydrocortisone cream prior to permethrin treatment to help with severe pruritus.   Patient was sent to the emergency room yesterday for flank pain, was cleared and sent back this morning   On review today patient continues to report feeling depressed with passive SI wishing self dead but denies active SI intention or plan able to contract for safety in the hospital, he reports poor sleep last night probably related to emergency room environment, denies HI or AVH, denies side effect medications.   Principal Problem: MDD (major depressive disorder) Diagnosis: Principal Problem:   MDD (major depressive disorder) Active Problems:   Generalized anxiety disorder   MDD (major depressive disorder), recurrent severe, without psychosis (HCC)   Housing instability after recent homelessness   Scabies  Total Time spent with patient: 15 minutes  Past Psychiatric History: MDD, GAD, numerous hospitalizations for suicidal thoughts, severe history of self-harm by cutting and suicide attempt by wrist cutting.  Past Medical History:  Past Medical History:  Diagnosis Date   Anxiety    Depression    History of admission to inpatient psychiatry department 05/03/2023   Migraine    Suicidal ideation 05/03/2023   History reviewed. No pertinent surgical history. Family History:  Family History  Problem Relation Age of Onset   Cancer Mother        brain    Diabetes Brother    Hypertension Brother    Family Psychiatric  History: No reported mental health conditions or substance use in the family except for MDD in second-degree relative. Social History:  Social History   Substance and Sexual Activity  Alcohol Use No     Social History   Substance and Sexual Activity  Drug Use Not Currently   Comment: denies use in the last month    Social History   Socioeconomic History   Marital status: Single    Spouse name: Not on file   Number of children: Not on file   Years of education: Not on file   Highest education level: Not on file  Occupational History   Not on file  Tobacco Use   Smoking status: Every Day    Current packs/day: 1.50    Average packs/day: 1.5 packs/day for 20.0 years (30.0 ttl pk-yrs)    Types: Cigarettes   Smokeless tobacco: Never  Vaping Use   Vaping status: Never Used  Substance and Sexual Activity   Alcohol use: No   Drug use: Not Currently    Comment: denies use in the last month   Sexual activity: Not Currently    Birth control/protection: Condom  Other Topics Concern   Not on file  Social History Narrative   Not on file   Social Determinants of Health   Financial Resource Strain: Not on file  Food Insecurity: Food Insecurity Present (06/20/2023)   Hunger Vital Sign    Worried About Running Out of Food in the Last Year: Often true    Ran Out of Food in the  Last Year: Often true  Transportation Needs: Unmet Transportation Needs (06/20/2023)   PRAPARE - Administrator, Civil Service (Medical): Yes    Lack of Transportation (Non-Medical): Yes  Physical Activity: Not on file  Stress: Not on file  Social Connections: Not on file   Additional Social History:   Childhood (bring, raised, lives now, parents, siblings, schooling, education): Does not go into details on his childhood but has a very poor relationship with his family currently.  Reports having a brother. Abuse: Does not report  any physical, emotional, sexual abuse anytime in life. Marital Status: Single Sexual orientation: Did not report Children: No children Employment: Currently unemployed.  Did not go into detail about his past employment Peer Group: Reports having no social support or peer group. Housing: Also living a house with his previous friends when they abandoned him and he was forced to leave the house. Finances: Significant financial issues Legal: No current legal issues. Military: Did not ask the patient.    Sleep: Poor secondary to itching and pain throughout the night.  Appetite:  Fair  Current Medications: Current Facility-Administered Medications  Medication Dose Route Frequency Provider Last Rate Last Admin   acetaminophen (TYLENOL) tablet 650 mg  650 mg Oral Q6H PRN Abbott Pao, Dalisa Forrer, MD       alum & mag hydroxide-simeth (MAALOX/MYLANTA) 200-200-20 MG/5ML suspension 30 mL  30 mL Oral Q4H PRN Abbott Pao, Elisavet Buehrer, MD       haloperidol (HALDOL) tablet 5 mg  5 mg Oral Q4H PRN Abbott Pao, Sam Wunschel, MD       And   LORazepam (ATIVAN) tablet 2 mg  2 mg Oral Q4H PRN Abbott Pao, Yareth Macdonnell, MD       And   benztropine (COGENTIN) tablet 1 mg  1 mg Oral Q4H PRN Abbott Pao, Terrian Sentell, MD       haloperidol lactate (HALDOL) injection 5 mg  5 mg Intramuscular Q4H PRN Abbott Pao, Lashya Passe, MD       And   LORazepam (ATIVAN) injection 2 mg  2 mg Intramuscular Q4H PRN Abbott Pao, Jamesia Linnen, MD       And   benztropine mesylate (COGENTIN) injection 1 mg  1 mg Intramuscular Q4H PRN Abbott Pao, Starlyn Droge, MD       FLUoxetine (PROZAC) capsule 40 mg  40 mg Oral Daily Cassey Bacigalupo, MD   40 mg at 06/23/23 1016   hydrOXYzine (ATARAX) tablet 25 mg  25 mg Oral TID PRN Sarita Bottom, MD   25 mg at 06/23/23 1016   loratadine (CLARITIN) tablet 10 mg  10 mg Oral Daily Luigi Stuckey, MD   10 mg at 06/23/23 1016   magnesium hydroxide (MILK OF MAGNESIA) suspension 30 mL  30 mL Oral Daily PRN Abbott Pao, Zev Blue, MD       mirtazapine (REMERON) tablet 7.5 mg  7.5 mg Oral QHS Suzane Vanderweide,  Gerardine Peltz, MD       traZODone (DESYREL) tablet 50 mg  50 mg Oral QHS PRN Abbott Pao, Mykayla Brinton, MD        Lab Results:  Results for orders placed or performed during the hospital encounter of 06/20/23 (from the past 48 hour(s))  HIV Antibody (routine testing w rflx)     Status: None   Collection Time: 06/22/23  6:28 AM  Result Value Ref Range   HIV Screen 4th Generation wRfx Non Reactive Non Reactive    Comment: Performed at [facility], [address], [place], [place] [address]  RPR     Status: None  Collection Time: 06/22/23  6:28 AM  Result Value Ref Range   RPR Ser Ql NON REACTIVE NON REACTIVE    Comment: Performed at [facility], [address] [place], [place] [address]    Blood Alcohol level:  Lab Results  Component Value Date   ETH <10 06/20/2023   ETH <10 06/03/2023    Metabolic Disorder Labs: Lab Results  Component Value Date   HGBA1C 5.3 04/30/2023   MPG 105 04/30/2023   MPG 105 08/28/2018   Lab Results  Component Value Date   PROLACTIN 6.7 08/28/2018   PROLACTIN 17.3 (H) 05/17/2017   Lab Results  Component Value Date   CHOL 168 04/30/2023   TRIG 108 04/30/2023   HDL 55 04/30/2023   CHOLHDL 3.1 04/30/2023   VLDL 22 04/30/2023   LDLCALC 91 04/30/2023   LDLCALC 104 (H) 08/28/2018    Musculoskeletal: Strength & Muscle Tone: within normal limits Gait & Station: normal Patient leans: N/A  Psychiatric Specialty Exam:  Presentation  General Appearance:  Disheveled below average hygiene Eye Contact: Limited Speech: Decreased amount, decreased tone and volume  Handedness: Right   Mood and Affect  Mood: Depressed; Dysphoric  Affect: Appropriate; Congruent; Depressed   Thought Process  Thought Processes: Coherent  Descriptions of Associations:Intact  Orientation:Full (Time, Place and Person)  Thought Content:Logical  History of Schizophrenia/Schizoaffective disorder:No  Duration of Psychotic Symptoms:No data  recorded Hallucinations:Hallucinations: None  Ideas of Reference:None  Suicidal Thoughts: Admits to passive SI but denies active SI intention or plan Homicidal Thoughts:Homicidal Thoughts: No   Sensorium  Memory: Immediate Fair; Recent Fair; Remote Fair  Judgment: Poor  Insight: Poor  Executive Functions  Concentration: Good  Attention Span: Good  Recall: Good  Fund of Knowledge: Good  Language: Good   Psychomotor Activity  Psychomotor Activity: Psychomotor Activity: Normal   Assets  Assets: Desire for Improvement   Sleep  Sleep: Reports poor sleep last night, will follow      Skin:    Comments: Numerous 3 to 5 mm papules throughout the arms back axillae and legs, receiving treatment for scabies Physical Exam Neurological:     General: No focal deficit present.    Review of Systems  All other systems reviewed and are negative.  Review of Systems  Constitutional:  Negative for chills and fever.  Gastrointestinal:  Negative for abdominal pain, constipation, diarrhea, nausea and vomiting.  Genitourinary:  Negative for dysuria and hematuria.  Blood pressure 110/76, pulse 78, temperature 98 F (36.7 C), temperature source Oral, resp. rate 16, height 5\' 8"  (1.727 m), weight 71.2 kg, SpO2 98%. Body mass index is 23.87 kg/m.    ASSESSMENT & PLAN   ASSESSMENT:   Diagnoses / Active Problems: MDD (major depressive disorder) GAD Experiencing helplessness (2 months)   "Angel [name] is a 42 year old male with a past history of DT, GAD, numerous hospitalizations for suicide ideation and attempt who presents to the behavioral health Hospital for all symptoms of depression, severe anxiety, active suicidal thoughts with intention but no plan.  Furthermore the patient is experiencing homelessness for the first time in his life.   Patient currently meets criteria for MDD and GAD.  We restarted fluoxetine, Remeron, scheduled hydroxyzine, all of which he  has responded in the past.  Given his intolerable living situation, social work will give patient resources for safe shelters in [place] (fortunately there are no shelters in his home county [place]).  Infectious disease was consulted on the patient and they  diagnosed scabies and recommended permethrin cream once today and again in a week.  ID also recommended screening testing for syphilis and HIV.  In addition to the permethrin treatment, we hope that shelter resource will prevent him from returning to his tent and [facility] where he got scabies originally.  Patient has been educated on scabies and information around reinfection and treatment and he voices understanding by teach back.  Given the severity of his current psychiatric conditions and comorbid scabies infection, he will likely need 7 days in the behavioral health Hospital, and this will offer him to receive his second dose of permethrin.  Furthermore the patient is malnourished and will receive ensures between meals.  7/26: Patient continues to have depressive symptoms and active suicidal thoughts.  That he was experiencing right flank pain concerning for acute abdomen, particularly appendicitis, and he will be worked up at the ED (see "plan of care" note for details).  Continues to have significant itching from his scabies infestation after receiving permethrin cream yesterday.  No changes to psychiatric medications.  Discharge plan for August 1 when he could receive his second permethrin treatment.  Syphilis screen negative; HIV still in process.   PLAN: Safety and Monitoring:             -- Involuntary admission to inpatient psychiatric unit for safety, stabilization and treatment             -- Daily contact with patient to assess and evaluate symptoms and progress in treatment             -- Patient's case to be discussed in multi-disciplinary team meeting             -- Observation Level : q15 minute checks              -- Vital signs:  q12 hours             -- Precautions: suicide, elopement, and assault   2. Psychiatric Diagnoses and Treatment:  -- Continue fluoxetine 40 mg once daily for MDD, GAD             -- Continue mirtazapine 7.5 mg once at night for MDD, insomnia             -- Continue hydroxyzine 25 mg as needed 3 times daily for anxiety             -- Continue trazodone 50 mg as needed once at night for insomnia --  The risks/benefits/side-effects/alternatives to this medication were discussed in detail with the patient and time was given for questions. The patient consents to medication trial.              -- Metabolic profile and EKG monitoring obtained while on an atypical antipsychotic. See #4 below for values.              -- Encouraged patient to participate in unit milieu and in scheduled group therapies              -- Short Term Goals: Ability to identify changes in lifestyle to reduce recurrence of condition will improve, Ability to verbalize feelings will improve, Ability to disclose and discuss suicidal ideas, Ability to demonstrate self-control will improve, Ability to identify and develop effective coping behaviors will improve, Ability to maintain clinical measurements within normal limits will improve, Compliance with prescribed medications will improve, and Ability to identify triggers associated with substance abuse/mental health issues will improve             --  Long Term Goals: Improvement in symptoms so as ready for discharge                3. Medical Issues Being Addressed:              -- Scabies infestation: 1st permethrin cream on 7/25. 2nd permethrin cream on 8/1 (or earlier potential if discharged before).  Continue loratadine 10 mg once daily for acute pruritus.             -- Migraines: Tylenol as needed available.  -- Acute R flank pain: See 7/26 "Plan of care" note for details.      4. Routine and other pertinent labs reviewed:  RPR 7/26: Nonreactive HIV 7/26: (In  process)  EKG monitoring: QTc: 444   Metabolism / endocrine: BMI: Body mass index is 23.87 kg/m. Prolactin: Recent Labs       Lab Results  Component Value Date    PROLACTIN 6.7 08/28/2018    PROLACTIN 17.3 (H) 05/17/2017      Lipid Panel: Recent Labs       Lab Results  Component Value Date    CHOL 168 04/30/2023    TRIG 108 04/30/2023    HDL 55 04/30/2023    CHOLHDL 3.1 04/30/2023    VLDL 22 04/30/2023    LDLCALC 91 04/30/2023    LDLCALC 104 (H) 08/28/2018      HbgA1c: Last Labs     Hgb A1c MFr Bld (%)  Date Value  04/30/2023 5.3      TSH: Last Labs     TSH (uIU/mL)  Date Value  08/28/2018 1.582        Labs to order: No current labs.   5. Discharge Planning:              -- Social work and case management to assist with discharge planning and identification of hospital follow-up needs prior to discharge             -- Estimated LOS: 8/1, could receive second permethrin treatment at this time.             -- Discharge Concerns: Need to establish a safety plan; Medication compliance and effectiveness             -- Discharge Goals: Return home with outpatient referrals for mental health follow-up including medication management/psychotherapy   I certify that inpatient services furnished can reasonably be expected to improve the patient's condition.   Maryanna Stuber Abbott Pao, MD 06/23/2023, 1:33 PM

## 2023-06-23 NOTE — Progress Notes (Signed)
   06/23/23 2220  Psych Admission Type (Psych Patients Only)  Admission Status Voluntary  Psychosocial Assessment  Patient Complaints Anxiety  Eye Contact Fair  Facial Expression Flat  Affect Appropriate to circumstance  Speech Logical/coherent  Interaction Minimal  Motor Activity Slow  Appearance/Hygiene Disheveled;Poor hygiene  Behavior Characteristics Appropriate to situation  Mood Depressed  Thought Process  Coherency WDL  Content WDL  Delusions None reported or observed  Perception WDL  Hallucination None reported or observed  Judgment Poor  Confusion None  Danger to Self  Current suicidal ideation? Denies  Self-Injurious Behavior No self-injurious ideation or behavior indicators observed or expressed   Agreement Not to Harm Self Yes  Description of Agreement verbal  Danger to Others  Danger to Others None reported or observed

## 2023-06-23 NOTE — ED Notes (Deleted)
Pt was given a breakfast tray, currently eating.

## 2023-06-23 NOTE — Progress Notes (Signed)
Patient ID: Angel [name], male   DOB: 29-Nov-1980, 42 y.o.   MRN: [id number] Pt readmitted from ED after reports of abdominal pain. Pt oriented to unit again, denies pain or acute concerns. VS wNL . Report to valerie.

## 2023-06-23 NOTE — Progress Notes (Signed)
D. Patient observed sitting on his bed- agitated- yanking on his hair in frustration, stating, "I don't want to be here. Where are my clothes? I'm homeless so that's all that I have." Explained to patient that his clothes were bagged due to his having scabies, but he would be able to get them back.  A. Labs and vitals monitored. Pt given and educated on medications. Pt provided with fluids, and given Vistaril 25 mg po for anxiety/agitation. Pt supported emotionally and encouraged to express concerns and ask questions.   R. Pt remains safe with 15 minute checks. Will continue POC.

## 2023-06-24 DIAGNOSIS — F332 Major depressive disorder, recurrent severe without psychotic features: Secondary | ICD-10-CM | POA: Diagnosis not present

## 2023-06-24 MED ORDER — NICOTINE 14 MG/24HR TD PT24
14.0000 mg | MEDICATED_PATCH | Freq: Every day | TRANSDERMAL | Status: DC
  Administered 2023-06-24 – 2023-07-04 (×11): 14 mg via TRANSDERMAL
  Filled 2023-06-24 (×16): qty 1

## 2023-06-24 MED ORDER — TRAZODONE HCL 100 MG PO TABS
100.0000 mg | ORAL_TABLET | Freq: Every evening | ORAL | Status: DC | PRN
  Administered 2023-06-24 – 2023-07-02 (×9): 100 mg via ORAL
  Filled 2023-06-24 (×9): qty 1

## 2023-06-24 MED ORDER — DIPHENHYDRAMINE HCL 25 MG PO CAPS
25.0000 mg | ORAL_CAPSULE | Freq: Three times a day (TID) | ORAL | Status: DC | PRN
  Administered 2023-06-24 – 2023-06-25 (×2): 25 mg via ORAL
  Filled 2023-06-24 (×2): qty 1

## 2023-06-24 MED ORDER — IVERMECTIN 3 MG PO TABS: 150.0000 ug/kg | ORAL_TABLET | Freq: Once | ORAL | Status: DC

## 2023-06-24 MED ORDER — MIRTAZAPINE 15 MG PO TABS
15.0000 mg | ORAL_TABLET | Freq: Every day | ORAL | Status: DC
  Administered 2023-06-24 – 2023-07-03 (×10): 15 mg via ORAL
  Filled 2023-06-24 (×17): qty 1

## 2023-06-24 MED ORDER — IVERMECTIN 3 MG PO TABS
150.0000 ug/kg | ORAL_TABLET | Freq: Once | ORAL | Status: AC
  Administered 2023-06-24: 10500 ug via ORAL
  Filled 2023-06-24: qty 4

## 2023-06-24 NOTE — [facility] Group Notes (Signed)
[facility] Group Notes:  (Nursing/MHT/Case Management/Adjunct)  Date:  06/24/2023  Time:  9:32 PM  Type of Therapy:  Wrap Up Group  Participation Level:  Did Not Attend  Participation Quality:   n/a  Affect:   n/a  Cognitive:   n/a  Insight:  None  Engagement in Group:   n/a  Modes of Intervention:   n/a  Summary of Progress/Problems:  Angel [name] Angel [name] 06/24/2023, 9:32 PM

## 2023-06-24 NOTE — Progress Notes (Signed)
   06/24/23 1400  Psych Admission Type (Psych Patients Only)  Admission Status Voluntary  Psychosocial Assessment  Patient Complaints Anxiety;Depression  Eye Contact Fair  Facial Expression Flat  Affect Appropriate to circumstance  Speech Logical/coherent  Interaction Minimal  Motor Activity Slow  Appearance/Hygiene Disheveled  Behavior Characteristics Appropriate to situation;Cooperative  Mood Depressed  Thought Process  Coherency WDL  Content WDL  Delusions WDL;None reported or observed  Perception WDL  Hallucination None reported or observed  Judgment Poor  Confusion None  Danger to Self  Current suicidal ideation? Passive  Description of Suicide Plan no plan  Self-Injurious Behavior No self-injurious ideation or behavior indicators observed or expressed   Agreement Not to Harm Self Yes  Description of Agreement agreed to contact staff before acting on harmful thoughts  Danger to Others  Danger to Others None reported or observed

## 2023-06-24 NOTE — Progress Notes (Signed)
   06/24/23 2256  Psych Admission Type (Psych Patients Only)  Admission Status Voluntary  Psychosocial Assessment  Patient Complaints Anxiety  Eye Contact Fair  Facial Expression Flat  Affect Appropriate to circumstance  Speech Logical/coherent  Interaction Minimal  Motor Activity Slow  Appearance/Hygiene Disheveled  Behavior Characteristics Appropriate to situation  Mood Depressed  Thought Process  Coherency WDL  Content WDL  Delusions None reported or observed  Perception WDL  Hallucination None reported or observed  Judgment Poor  Confusion None  Danger to Self  Current suicidal ideation? Denies  Self-Injurious Behavior No self-injurious ideation or behavior indicators observed or expressed   Agreement Not to Harm Self Yes  Description of Agreement verbal  Danger to Others  Danger to Others None reported or observed

## 2023-06-24 NOTE — [facility] Group Notes (Signed)
06/24/2023 11:00am  Type of Group and Topic: Psychoeducational Group:  Wellness  Participation Level:  did not attend  Description of Group  Wellness group introduces the topic and its focus on developing healthy habits across the spectrum and its relationship to a decrease in hospital admissions.  Six areas of wellness are discussed: physical, social spiritual, intellectual, occupational, and emotional.  Patients are asked to consider their current wellness habits and to identify areas of wellness where they are interested and able to focus on improvements.    Therapeutic Goals Patients will understand components of wellness and how they can positively impact overall health.  Patients will identify areas of wellness where they have developed good habits. Patients will identify areas of wellness where they would like to make improvements.    Summary of Patient Progress     Therapeutic Modalities: Cognitive Behavioral Therapy Psychoeducation    Lorri Frederick, LCSW

## 2023-06-24 NOTE — Progress Notes (Signed)
[facility] MD Progress Note  06/24/2023 10:01 AM SHARROD BREACH  MRN:  [id number] Subjective:   Natale Lay" Mcquillin is a 42 year old male with past psychiatric history of MDD, GAD; psychiatric hospitalization 2 weeks ago (SI) and numerous in the past (SI/SA); history of self-harm and 1 suicide attempt by wrist cutting.    Case was discussed in the multidisciplinary team. MAR was reviewed and patient is compliant with medications.  Patient received 1 dose of oral ivermectin for scabies this morning 7/28 with another dose due on 8/4.  He has been receiving Atarax as needed for anxiety and itching, Benadryl as needed ordered but not used.   On review today patient continues to report feeling depressed with passive SI wishing self dead but denies active SI intention or plan able to contract for safety in the hospital, he reports poor sleep, we discussed titrating sleep medicine and Remeron, denies HI or AVH, denies side effect medications. Patient is currently on contact precautions, in process of communicating with ID for further recommendations.  Principal Problem: MDD (major depressive disorder) Diagnosis: Principal Problem:   MDD (major depressive disorder) Active Problems:   Generalized anxiety disorder   MDD (major depressive disorder), recurrent severe, without psychosis (HCC)   Housing instability after recent homelessness   Scabies  Total Time spent with patient: 35 minutes  Past Psychiatric History: MDD, GAD, numerous hospitalizations for suicidal thoughts, severe history of self-harm by cutting and suicide attempt by wrist cutting.  Past Medical History:  Past Medical History:  Diagnosis Date   Anxiety    Depression    History of admission to inpatient psychiatry department 05/03/2023   Migraine    Suicidal ideation 05/03/2023   History reviewed. No pertinent surgical history. Family History:  Family History  Problem Relation Age of Onset   Cancer Mother        brain    Diabetes Brother    Hypertension Brother    Family Psychiatric  History: No reported mental health conditions or substance use in the family except for MDD in second-degree relative. Social History:  Social History   Substance and Sexual Activity  Alcohol Use No     Social History   Substance and Sexual Activity  Drug Use Not Currently   Comment: denies use in the last month    Social History   Socioeconomic History   Marital status: Single    Spouse name: Not on file   Number of children: Not on file   Years of education: Not on file   Highest education level: Not on file  Occupational History   Not on file  Tobacco Use   Smoking status: Every Day    Current packs/day: 1.50    Average packs/day: 1.5 packs/day for 20.0 years (30.0 ttl pk-yrs)    Types: Cigarettes   Smokeless tobacco: Never  Vaping Use   Vaping status: Never Used  Substance and Sexual Activity   Alcohol use: No   Drug use: Not Currently    Comment: denies use in the last month   Sexual activity: Not Currently    Birth control/protection: Condom  Other Topics Concern   Not on file  Social History Narrative   Not on file   Social Determinants of Health   Financial Resource Strain: Not on file  Food Insecurity: Food Insecurity Present (06/20/2023)   Hunger Vital Sign    Worried About Running Out of Food in the Last Year: Often true    Ran Out of  Food in the Last Year: Often true  Transportation Needs: Unmet Transportation Needs (06/20/2023)   PRAPARE - Administrator, Civil Service (Medical): Yes    Lack of Transportation (Non-Medical): Yes  Physical Activity: Not on file  Stress: Not on file  Social Connections: Not on file   Additional Social History:   Childhood (bring, raised, lives now, parents, siblings, schooling, education): Does not go into details on his childhood but has a very poor relationship with his family currently.  Reports having a brother. Abuse: Does not report  any physical, emotional, sexual abuse anytime in life. Marital Status: Single Sexual orientation: Did not report Children: No children Employment: Currently unemployed.  Did not go into detail about his past employment Peer Group: Reports having no social support or peer group. Housing: Also living a house with his previous friends when they abandoned him and he was forced to leave the house. Finances: Significant financial issues Legal: No current legal issues. Military: Did not ask the patient.    Sleep: Poor secondary to itching and pain throughout the night.  Appetite:  Fair  Current Medications: Current Facility-Administered Medications  Medication Dose Route Frequency Provider Last Rate Last Admin   acetaminophen (TYLENOL) tablet 650 mg  650 mg Oral Q6H PRN Abbott Pao, Nataliya Graig, MD       alum & mag hydroxide-simeth (MAALOX/MYLANTA) 200-200-20 MG/5ML suspension 30 mL  30 mL Oral Q4H PRN Abbott Pao, Oakland Fant, MD       haloperidol (HALDOL) tablet 5 mg  5 mg Oral Q4H PRN Abbott Pao, Bryson Gavia, MD       And   LORazepam (ATIVAN) tablet 2 mg  2 mg Oral Q4H PRN Abbott Pao, Roselynn Whitacre, MD       And   benztropine (COGENTIN) tablet 1 mg  1 mg Oral Q4H PRN Abbott Pao, Selah Zelman, MD       haloperidol lactate (HALDOL) injection 5 mg  5 mg Intramuscular Q4H PRN Abbott Pao, Stanislav Gervase, MD       And   LORazepam (ATIVAN) injection 2 mg  2 mg Intramuscular Q4H PRN Abbott Pao, Soliyana Mcchristian, MD       And   benztropine mesylate (COGENTIN) injection 1 mg  1 mg Intramuscular Q4H PRN Abbott Pao, Tamme Mozingo, MD       diphenhydrAMINE (BENADRYL) capsule 50 mg  50 mg Oral QHS PRN Margaretmary Dys, MD       FLUoxetine (PROZAC) capsule 40 mg  40 mg Oral Daily Abbott Pao, Twylia Oka, MD   40 mg at 06/24/23 0755   hydrocortisone cream 1 %   Topical BID Margaretmary Dys, MD   1 Application at 06/24/23 1610   hydrOXYzine (ATARAX) tablet 25 mg  25 mg Oral TID PRN Sarita Bottom, MD   25 mg at 06/23/23 2054   [START ON 07/01/2023] ivermectin (STROMECTOL) tablet  10,500 mcg  150 mcg/kg Oral Once Posie Lillibridge, MD       loratadine (CLARITIN) tablet 10 mg  10 mg Oral Daily Jatasia Gundrum, MD   10 mg at 06/24/23 0755   magnesium hydroxide (MILK OF MAGNESIA) suspension 30 mL  30 mL Oral Daily PRN Abbott Pao, Tierria Watson, MD       mirtazapine (REMERON) tablet 7.5 mg  7.5 mg Oral QHS Floy Angert, MD   7.5 mg at 06/23/23 2053   nicotine (NICODERM CQ - dosed in mg/24 hours) patch 14 mg  14 mg Transdermal Daily Kyrese Gartman, MD   14 mg at 06/24/23 0915   traZODone (DESYREL) tablet 50 mg  50 mg Oral QHS PRN Sarita Bottom, MD   50 mg at 06/23/23 2054    Lab Results:  Results for orders placed or performed during the hospital encounter of 06/22/23 (from the past 48 hour(s))  Basic metabolic panel     Status: None   Collection Time: 06/22/23 11:07 AM  Result Value Ref Range   Sodium 137 135 - 145 mmol/L   Potassium 4.1 3.5 - 5.1 mmol/L   Chloride 101 98 - 111 mmol/L   CO2 26 22 - 32 mmol/L   Glucose, Bld 89 70 - 99 mg/dL    Comment: Glucose reference range applies only to samples taken after fasting for at least 8 hours.   BUN 19 6 - 20 mg/dL   Creatinine, Ser 9.14 0.61 - 1.24 mg/dL   Calcium 9.0 8.9 - 78.2 mg/dL   GFR, Estimated >95 >62 mL/min    Comment: (NOTE) Calculated using the CKD-EPI Creatinine Equation (2021)    Anion gap 10 5 - 15    Comment: Performed at [facility], [address], [place], [place] [address]  CBC     Status: None   Collection Time: 06/22/23 11:07 AM  Result Value Ref Range   WBC 7.7 4.0 - 10.5 K/uL   RBC 4.80 4.22 - 5.81 MIL/uL   Hemoglobin 14.1 13.0 - 17.0 g/dL   HCT 57.8 46.9 - 62.9 %   MCV 87.7 80.0 - 100.0 fL   MCH 29.4 26.0 - 34.0 pg   MCHC 33.5 30.0 - 36.0 g/dL   RDW 52.8 41.3 - 24.4 %   Platelets 244 150 - 400 K/uL   nRBC 0.0 0.0 - 0.2 %    Comment: Performed at [facility], [address] [place], [place] [address]  Urinalysis, Routine w reflex microscopic -Urine, Clean Catch      Status: None   Collection Time: 06/22/23 11:13 AM  Result Value Ref Range   Color, Urine YELLOW YELLOW   APPearance CLEAR CLEAR   Specific Gravity, Urine 1.024 1.005 - 1.030   pH 6.0 5.0 - 8.0   Glucose, UA NEGATIVE NEGATIVE mg/dL   Hgb urine dipstick NEGATIVE NEGATIVE   Bilirubin Urine NEGATIVE NEGATIVE   Ketones, ur NEGATIVE NEGATIVE mg/dL   Protein, ur NEGATIVE NEGATIVE mg/dL   Nitrite NEGATIVE NEGATIVE   Leukocytes,Ua NEGATIVE NEGATIVE    Comment: Performed at [facility], [address] [place], [place] [address]    Blood Alcohol level:  Lab Results  Component Value Date   ETH <10 06/20/2023   ETH <10 06/03/2023    Metabolic Disorder Labs: Lab Results  Component Value Date   HGBA1C 5.3 04/30/2023   MPG 105 04/30/2023   MPG 105 08/28/2018   Lab Results  Component Value Date   PROLACTIN 6.7 08/28/2018   PROLACTIN 17.3 (H) 05/17/2017   Lab Results  Component Value Date   CHOL 168 04/30/2023   TRIG 108 04/30/2023   HDL 55 04/30/2023   CHOLHDL 3.1 04/30/2023   VLDL 22 04/30/2023   LDLCALC 91 04/30/2023   LDLCALC 104 (H) 08/28/2018    Musculoskeletal: Strength & Muscle Tone: within normal limits Gait & Station: normal Patient leans: N/A  Psychiatric Specialty Exam:  Presentation  General Appearance:  Disheveled below average hygiene Eye Contact: Limited Speech: Decreased amount, decreased tone and volume  Handedness: Right   Mood and Affect  Mood: Depressed; Dysphoric  Affect: Appropriate; Congruent; Depressed   Thought Process  Thought Processes: Coherent  Descriptions of Associations:Intact  Orientation:Full (Time, Place and Person)  Thought Content:Logical  History of Schizophrenia/Schizoaffective disorder:No  Duration of Psychotic Symptoms:No data recorded Hallucinations:No data recorded  Ideas of Reference:None  Suicidal Thoughts: Admits to passive SI but denies active SI intention or plan Homicidal  Thoughts:No data recorded   Sensorium  Memory: Immediate Fair; Recent Fair; Remote Fair  Judgment: Poor  Insight: Poor  Executive Functions  Concentration: Good  Attention Span: Good  Recall: Good  Fund of Knowledge: Good  Language: Good   Psychomotor Activity  Psychomotor Activity: No data recorded   Assets  Assets: Desire for Improvement   Sleep  Sleep: Reports poor sleep last night, will follow      Skin:    Comments: Numerous 3 to 5 mm papules throughout the arms back axillae and legs, receiving treatment for scabies Physical Exam Neurological:     General: No focal deficit present.    Review of Systems  All other systems reviewed and are negative.  Review of Systems  Constitutional:  Negative for chills and fever.  Gastrointestinal:  Negative for abdominal pain, constipation, diarrhea, nausea and vomiting.  Genitourinary:  Negative for dysuria and hematuria.  Blood pressure 102/76, pulse 89, temperature 98.4 F (36.9 C), temperature source Oral, resp. rate 16, height 5\' 8"  (1.727 m), weight 71.2 kg, SpO2 96%. Body mass index is 23.87 kg/m.    ASSESSMENT & PLAN   ASSESSMENT:   Diagnoses / Active Problems: MDD (major depressive disorder) GAD Experiencing helplessness (2 months)   "Mikey" Salata is a 41 year old male with a past history of DT, GAD, numerous hospitalizations for suicide ideation and attempt who presents to the behavioral health Hospital for all symptoms of depression, severe anxiety, active suicidal thoughts with intention but no plan.  Furthermore the patient is experiencing homelessness for the first time in his life.   Patient currently meets criteria for MDD and GAD.  We restarted fluoxetine, Remeron, scheduled hydroxyzine, all of which he has responded in the past.  Given his intolerable living situation, social work will give patient resources for safe shelters in [place] (fortunately there are no shelters in  his home county [place]).  Infectious disease was consulted on the patient and they diagnosed scabies and recommended permethrin cream once today and again in a week.  ID also recommended screening testing for syphilis and HIV.  In addition to the permethrin treatment, we hope that shelter resource will prevent him from returning to his tent and [facility] where he got scabies originally.  Patient has been educated on scabies and information around reinfection and treatment and he voices understanding by teach back.  Given the severity of his current psychiatric conditions and comorbid scabies infection, he will likely need 7 days in the behavioral health Hospital, and this will offer him to receive his second dose of permethrin.  Furthermore the patient is malnourished and will receive ensures between meals.  7/26: Patient continues to have depressive symptoms and active suicidal thoughts.  That he was experiencing right flank pain concerning for acute abdomen, particularly appendicitis, and he will be worked up at the ED (see "plan of care" note for details).  Continues to have significant itching from his scabies infestation after receiving permethrin cream yesterday.  No changes to psychiatric medications.  Discharge plan for August 1 when he could receive his second permethrin treatment.  Syphilis screen negative; HIV still in process.   PLAN: Safety and Monitoring:             --  Involuntary admission to inpatient psychiatric unit for safety, stabilization and treatment             -- Daily contact with patient to assess and evaluate symptoms and progress in treatment             -- Patient's case to be discussed in multi-disciplinary team meeting             -- Observation Level : q15 minute checks             -- Vital signs:  q12 hours             -- Precautions: suicide, elopement, and assault   2. Psychiatric Diagnoses and Treatment:  -- Continue fluoxetine 40 mg once daily for MDD,  GAD             -- Titrate mirtazapine from 7.5-15 mg once at night for MDD, insomnia             -- Continue hydroxyzine 25 mg as needed 3 times daily for anxiety             -- Titrate trazodone from 50-100 mg as needed once at night for insomnia   --  The risks/benefits/side-effects/alternatives to this medication were discussed in detail with the patient and time was given for questions. The patient consents to medication trial.              -- Metabolic profile and EKG monitoring obtained while on an atypical antipsychotic. See #4 below for values.              -- Encouraged patient to participate in unit milieu and in scheduled group therapies              -- Short Term Goals: Ability to identify changes in lifestyle to reduce recurrence of condition will improve, Ability to verbalize feelings will improve, Ability to disclose and discuss suicidal ideas, Ability to demonstrate self-control will improve, Ability to identify and develop effective coping behaviors will improve, Ability to maintain clinical measurements within normal limits will improve, Compliance with prescribed medications will improve, and Ability to identify triggers associated with substance abuse/mental health issues will improve             -- Long Term Goals: Improvement in symptoms so as ready for discharge                3. Medical Issues Being Addressed:              -- Scabies infestation: 1st permethrin cream on 7/25. 2nd permethrin cream on 8/1 (or earlier potential if discharged before).  Continue loratadine 10 mg once daily for acute pruritus.  Received 1 dose of oral Ivermectin on 7/28 with another dose due 8/4             -- Migraines: Tylenol as needed available.     4. Routine and other pertinent labs reviewed:  RPR 7/26: Nonreactive HIV 7/26: (In process)  EKG monitoring: QTc: 444   Metabolism / endocrine: BMI: Body mass index is 23.87 kg/m. Prolactin: Recent Labs       Lab Results  Component  Value Date    PROLACTIN 6.7 08/28/2018    PROLACTIN 17.3 (H) 05/17/2017      Lipid Panel: Recent Labs       Lab Results  Component Value Date    CHOL 168 04/30/2023    TRIG 108 04/30/2023    HDL 55 04/30/2023  CHOLHDL 3.1 04/30/2023    VLDL 22 04/30/2023    LDLCALC 91 04/30/2023    LDLCALC 104 (H) 08/28/2018      HbgA1c: Last Labs     Hgb A1c MFr Bld (%)  Date Value  04/30/2023 5.3      TSH: Last Labs     TSH (uIU/mL)  Date Value  08/28/2018 1.582        Labs to order: No current labs.   5. Discharge Planning:              -- Social work and case management to assist with discharge planning and identification of hospital follow-up needs prior to discharge             -- Estimated LOS: 8/1, could receive second permethrin treatment at this time.             -- Discharge Concerns: Need to establish a safety plan; Medication compliance and effectiveness             -- Discharge Goals: Return home with outpatient referrals for mental health follow-up including medication management/psychotherapy   I certify that inpatient services furnished can reasonably be expected to improve the patient's condition.   Aahna Rossa Abbott Pao, MD 06/24/2023, 10:01 AM

## 2023-06-25 MED ORDER — PERMETHRIN 5 % EX CREA: TOPICAL_CREAM | Freq: Once | CUTANEOUS | Status: DC

## 2023-06-25 NOTE — Progress Notes (Signed)
   06/25/23 1000  Psych Admission Type (Psych Patients Only)  Admission Status Voluntary  Psychosocial Assessment  Patient Complaints Anxiety;Depression  Eye Contact Fair  Facial Expression Flat  Affect Appropriate to circumstance  Speech Logical/coherent  Interaction Minimal  Motor Activity Slow  Appearance/Hygiene Disheveled  Behavior Characteristics Cooperative;Appropriate to situation  Mood Depressed  Thought Process  Coherency WDL  Content WDL  Delusions None reported or observed  Perception WDL  Hallucination None reported or observed  Judgment Poor  Confusion None  Danger to Self  Current suicidal ideation? Passive  Description of Suicide Plan no plan  Self-Injurious Behavior No self-injurious ideation or behavior indicators observed or expressed   Agreement Not to Harm Self Yes  Description of Agreement verbal  Danger to Others  Danger to Others None reported or observed   Pt remains in his room on contact precautions.

## 2023-06-25 NOTE — Progress Notes (Signed)
[facility] MD Progress Note  06/25/2023 9:58 AM Angel [name]  MRN:  [id number] Subjective:   Angel [name] is a 42 year old male with past psychiatric history of MDD, GAD; psychiatric hospitalization 2 weeks ago (SI) and numerous in the past (SI/SA); history of self-harm and 1 suicide attempt by wrist cutting.    Case was discussed in the multidisciplinary team. MAR was reviewed and patient is compliant with medications.  Patient received 1 dose of oral ivermectin for scabies yesterday morning 7/28 with another dose due on 8/4 (which pharmacy can provide at discharge if he leaves before 8/4 so he can take outpatient).  For PRNs, he has been receiving hydroxyzine x 2 for anxiety and itchjing, diphenhydramine x 1 for itching, trazodone at bedtime.   Psychiatric Team made the following recommendations yesterday: Received 1 dose ivermectin 150 mcg/kg for non-crusted scabies Increase mirtazapine from 7.5 to 15 mg once at night for MDD, insomnia Increase trazodone from 50 to 100 mg as needed once at night for insomnia   On interview today the patient reports that he continues to have significant itching (although he is itching most close when observing him) and depressive symptoms and anxiety.  He reports that he is still continue to worry about his living situation and feels overwhelmed by these thoughts to the point that he cannot function.  He reports improvement of his right flank pain for which she was sent to the ED Friday for workup (ED reported likely MSK related.  He continues to report passive suicidal thoughts but no HI, AVH.  He reports that he is still sleeping terribly and that he has not noticed the effects of the increased trazodone and mirtazapine.  He reports that his appetite is fine.  When asked whether he will go back to [facility] or stay in [place] due to the increased number of housing resources, he reported that he does not know but he will think about it.,  And he  appeared very distressed by this question.   Principal Problem: MDD (major depressive disorder) Diagnosis: Principal Problem:   MDD (major depressive disorder) Active Problems:   Generalized anxiety disorder   MDD (major depressive disorder), recurrent severe, without psychosis (HCC)   Housing instability after recent homelessness   Scabies  Total Time spent with patient: 35 minutes  Past Psychiatric History: MDD, GAD, numerous hospitalizations for suicidal thoughts, severe history of self-harm by cutting and suicide attempt by wrist cutting.  Past Medical History:  Past Medical History:  Diagnosis Date   Anxiety    Depression    History of admission to inpatient psychiatry department 05/03/2023   Migraine    Suicidal ideation 05/03/2023   History reviewed. No pertinent surgical history. Family History:  Family History  Problem Relation Age of Onset   Cancer Mother        brain   Diabetes Brother    Hypertension Brother    Family Psychiatric  History: No reported mental health conditions or substance use in the family except for MDD in second-degree relative. Social History:  Social History   Substance and Sexual Activity  Alcohol Use No     Social History   Substance and Sexual Activity  Drug Use Not Currently   Comment: denies use in the last month    Social History   Socioeconomic History   Marital status: Single    Spouse name: Not on file   Number of children: Not on file   Years of education: Not  on file   Highest education level: Not on file  Occupational History   Not on file  Tobacco Use   Smoking status: Every Day    Current packs/day: 1.50    Average packs/day: 1.5 packs/day for 20.0 years (30.0 ttl pk-yrs)    Types: Cigarettes   Smokeless tobacco: Never  Vaping Use   Vaping status: Never Used  Substance and Sexual Activity   Alcohol use: No   Drug use: Not Currently    Comment: denies use in the last month   Sexual activity: Not  Currently    Birth control/protection: Condom  Other Topics Concern   Not on file  Social History Narrative   Not on file   Social Determinants of Health   Financial Resource Strain: Not on file  Food Insecurity: Food Insecurity Present (06/20/2023)   Hunger Vital Sign    Worried About Running Out of Food in the Last Year: Often true    Ran Out of Food in the Last Year: Often true  Transportation Needs: Unmet Transportation Needs (06/20/2023)   PRAPARE - Administrator, Civil Service (Medical): Yes    Lack of Transportation (Non-Medical): Yes  Physical Activity: Not on file  Stress: Not on file  Social Connections: Not on file   Additional Social History:   Childhood (bring, raised, lives now, parents, siblings, schooling, education): Does not go into details on his childhood but has a very poor relationship with his family currently.  Reports having a brother. Abuse: Does not report any physical, emotional, sexual abuse anytime in life. Marital Status: Single Sexual orientation: Did not report Children: No children Employment: Currently unemployed.  Did not go into detail about his past employment Peer Group: Reports having no social support or peer group. Housing: Also living a house with his previous friends when they abandoned him and he was forced to leave the house. Finances: Significant financial issues Legal: No current legal issues. Military: Did not ask the patient.    Sleep: Poor secondary to itching and pain throughout the night.  Appetite:  Fair  Current Medications: Current Facility-Administered Medications  Medication Dose Route Frequency Provider Last Rate Last Admin   acetaminophen (TYLENOL) tablet 650 mg  650 mg Oral Q6H PRN Abbott Pao, Nadir, MD       alum & mag hydroxide-simeth (MAALOX/MYLANTA) 200-200-20 MG/5ML suspension 30 mL  30 mL Oral Q4H PRN Abbott Pao, Nadir, MD       haloperidol (HALDOL) tablet 5 mg  5 mg Oral Q4H PRN Abbott Pao, Nadir, MD        And   LORazepam (ATIVAN) tablet 2 mg  2 mg Oral Q4H PRN Abbott Pao, Nadir, MD       And   benztropine (COGENTIN) tablet 1 mg  1 mg Oral Q4H PRN Abbott Pao, Nadir, MD       haloperidol lactate (HALDOL) injection 5 mg  5 mg Intramuscular Q4H PRN Abbott Pao, Nadir, MD       And   LORazepam (ATIVAN) injection 2 mg  2 mg Intramuscular Q4H PRN Abbott Pao, Nadir, MD       And   benztropine mesylate (COGENTIN) injection 1 mg  1 mg Intramuscular Q4H PRN Attiah, Nadir, MD       diphenhydrAMINE (BENADRYL) capsule 25 mg  25 mg Oral Q8H PRN Abbott Pao, Nadir, MD   25 mg at 06/24/23 1743   FLUoxetine (PROZAC) capsule 40 mg  40 mg Oral Daily Attiah, Nadir, MD   40 mg at 06/25/23 [phone]  hydrocortisone cream 1 %   Topical BID Margaretmary Dys, MD   Given at 06/25/23 1610   hydrOXYzine (ATARAX) tablet 25 mg  25 mg Oral TID PRN Sarita Bottom, MD   25 mg at 06/24/23 2123   [START ON 07/01/2023] ivermectin (STROMECTOL) tablet 10,500 mcg  150 mcg/kg Oral Once Abbott Pao, Nadir, MD       loratadine (CLARITIN) tablet 10 mg  10 mg Oral Daily Attiah, Nadir, MD   10 mg at 06/25/23 0803   magnesium hydroxide (MILK OF MAGNESIA) suspension 30 mL  30 mL Oral Daily PRN Abbott Pao, Nadir, MD       mirtazapine (REMERON) tablet 15 mg  15 mg Oral QHS Attiah, Nadir, MD   15 mg at 06/24/23 2123   nicotine (NICODERM CQ - dosed in mg/24 hours) patch 14 mg  14 mg Transdermal Daily Attiah, Nadir, MD   14 mg at 06/25/23 0803   traZODone (DESYREL) tablet 100 mg  100 mg Oral QHS PRN Sarita Bottom, MD   100 mg at 06/24/23 2124    Lab Results:  No results found for this or any previous visit (from the past 48 hour(s)).   Blood Alcohol level:  Lab Results  Component Value Date   ETH <10 06/20/2023   ETH <10 06/03/2023    Metabolic Disorder Labs: Lab Results  Component Value Date   HGBA1C 5.3 04/30/2023   MPG 105 04/30/2023   MPG 105 08/28/2018   Lab Results  Component Value Date   PROLACTIN 6.7 08/28/2018   PROLACTIN 17.3 (H) 05/17/2017    Lab Results  Component Value Date   CHOL 168 04/30/2023   TRIG 108 04/30/2023   HDL 55 04/30/2023   CHOLHDL 3.1 04/30/2023   VLDL 22 04/30/2023   LDLCALC 91 04/30/2023   LDLCALC 104 (H) 08/28/2018    Musculoskeletal: Strength & Muscle Tone: within normal limits Gait & Station: normal Patient leans: N/A  Psychiatric Specialty Exam:  Presentation  General Appearance:  Disheveled below average hygiene Eye Contact: Limited Speech: Decreased amount, decreased tone and volume  Handedness: Right   Mood and Affect  Mood: Depressed; Anxious  Affect: Congruent   Thought Process  Thought Processes: Coherent  Descriptions of Associations:Intact  Orientation:Full (Time, Place and Person)  Thought Content:Logical  History of Schizophrenia/Schizoaffective disorder:No  Duration of Psychotic Symptoms:No data recorded Hallucinations:Hallucinations: None   Ideas of Reference:None  Suicidal Thoughts: Admits to passive SI but denies active SI intention or plan Homicidal Thoughts:Homicidal Thoughts: No    Sensorium  Memory: Immediate Fair; Recent Fair; Remote Fair  Judgment: Fair  Insight: Poor  Executive Functions  Concentration: Good  Attention Span: Good  Recall: Good  Fund of Knowledge: Good  Language: Good   Psychomotor Activity  Psychomotor Activity: Psychomotor Activity: Normal    Assets  Assets: Desire for Improvement   Sleep  Sleep: Reports poor sleep last night, will follow      Skin:    Comments: Numerous 3 to 5 mm papules throughout the arms back axillae and legs, receiving treatment for scabies Physical Exam Vitals and nursing note reviewed.  HENT:     Head: Normocephalic and atraumatic.  Pulmonary:     Effort: Pulmonary effort is normal.  Skin:    Comments: Continues to have 3 to 5 mm erythematous papules throughout his arms axillae back and legs.  Neurological:     General: No focal deficit present.      Mental Status: He is alert.  Review of Systems  Constitutional:  Negative for fever.  Cardiovascular:  Negative for chest pain and palpitations.  Gastrointestinal:  Negative for constipation, diarrhea, nausea and vomiting.  Skin:  Positive for itching.  Neurological:  Negative for dizziness, weakness and headaches.  All other systems reviewed and are negative.  Review of Systems  Constitutional:  Negative for chills and fever.  Gastrointestinal:  Negative for abdominal pain, constipation, diarrhea, nausea and vomiting.  Genitourinary:  Negative for dysuria and hematuria.  Blood pressure 102/76, pulse 89, temperature 98.4 F (36.9 C), temperature source Oral, resp. rate 16, height 5\' 8"  (1.727 m), weight 71.2 kg, SpO2 96%. Body mass index is 23.87 kg/m.    ASSESSMENT & PLAN   ASSESSMENT:   Diagnoses / Active Problems: MDD (major depressive disorder) GAD Experiencing helplessness (2 months)   "Angel [name]" Giacomo is a 42 year old male with a past history of DT, GAD, numerous hospitalizations for suicide ideation and attempt who presents to the behavioral health Hospital for all symptoms of depression, severe anxiety, active suicidal thoughts with intention but no plan.  Furthermore the patient is experiencing homelessness for the first time in his life.   Patient currently meets criteria for MDD and GAD.  We restarted fluoxetine, Remeron, scheduled hydroxyzine, all of which he has responded in the past.  Given his intolerable living situation, social work will give patient resources for safe shelters in [place] (fortunately there are no shelters in his home county [place]).  Infectious disease was consulted on the patient and they diagnosed scabies and recommended permethrin cream once today and again in a week.  ID also recommended screening testing for syphilis and HIV.  In addition to the permethrin treatment, we hope that shelter resource will prevent him from returning to his  tent and [facility] where he got scabies originally.  Patient has been educated on scabies and information around reinfection and treatment and he voices understanding by teach back.  Given the severity of his current psychiatric conditions and comorbid scabies infection, he will likely need 7 days in the behavioral health Hospital, and this will offer him to receive his second dose of permethrin.  Furthermore the patient is malnourished and will receive ensures between meals.  7/26: Patient continues to have depressive symptoms and active suicidal thoughts.  That he was experiencing right flank pain concerning for acute abdomen, particularly appendicitis, and he will be worked up at the ED (see "plan of care" note for details).  Continues to have significant itching from his scabies infestation after receiving permethrin cream yesterday.  No changes to psychiatric medications.  Discharge plan for August 1 when he could receive his second permethrin treatment.  Syphilis screen negative; HIV still in process.  7/27+28: ED ruled out life-threatening causes acute abdomen and reported right flank pain likely related to MSK.  On 7/28 titrated up on mirtazapine and trazodone.  Received ivermectin.  7/29: Infectious disease clarified that the patient needs to be on contact precautions and nursing care order the patient cannot leave her room to interact with other patients.  Patient is tolerating the increased doses of mirtazapine and trazodone.  Discharge planning and shelter resources discussed with patient today but he is not sure whether he will stay in [facility] or go back to [facility].    PLAN: Safety and Monitoring:             -- Involuntary admission to inpatient psychiatric unit for safety, stabilization and treatment             --  Daily contact with patient to assess and evaluate symptoms and progress in treatment             -- Patient's case to be discussed in  multi-disciplinary team meeting             -- Observation Level : q15 minute checks             -- Vital signs:  q12 hours             -- Precautions: suicide, elopement, and assault   2. Psychiatric Diagnoses and Treatment:  -- Continue fluoxetine 40 mg once daily for MDD, GAD             -- Continue mirtazapine from 15 mg once at night for MDD, insomnia             -- Continue hydroxyzine 25 mg as needed 3 times daily for anxiety             -- Continue trazodone from 100 mg as needed once at night for insomnia   --  The risks/benefits/side-effects/alternatives to this medication were discussed in detail with the patient and time was given for questions. The patient consents to medication trial.              -- Metabolic profile and EKG monitoring obtained while on an atypical antipsychotic. See #4 below for values.              -- Encouraged patient to participate in unit milieu and in scheduled group therapies              -- Short Term Goals: Ability to identify changes in lifestyle to reduce recurrence of condition will improve, Ability to verbalize feelings will improve, Ability to disclose and discuss suicidal ideas, Ability to demonstrate self-control will improve, Ability to identify and develop effective coping behaviors will improve, Ability to maintain clinical measurements within normal limits will improve, Compliance with prescribed medications will improve, and Ability to identify triggers associated with substance abuse/mental health issues will improve             -- Long Term Goals: Improvement in symptoms so as ready for discharge                3. Medical Issues Being Addressed:              -- Scabies infestation: 1st permethrin cream on 7/25. 2nd permethrin cream on 8/1 (or earlier potential if discharged before).  Continue loratadine 10 mg once daily for acute pruritus.  Received 1 dose of oral Ivermectin on 7/28 with another dose due 8/4 (pharmacy can give him the second  dose at discharge so he can take 8/4)             -- Migraines: Tylenol as needed available.     4. Routine and other pertinent labs reviewed:  RPR 7/26: Negative HIV 7/26: Negative  EKG monitoring: QTc: 444   Metabolism / endocrine: BMI: Body mass index is 23.87 kg/m. Prolactin: Recent Labs       Lab Results  Component Value Date    PROLACTIN 6.7 08/28/2018    PROLACTIN 17.3 (H) 05/17/2017      Lipid Panel: Recent Labs       Lab Results  Component Value Date    CHOL 168 04/30/2023    TRIG 108 04/30/2023    HDL 55 04/30/2023    CHOLHDL 3.1 04/30/2023    VLDL 22  04/30/2023    LDLCALC 91 04/30/2023    LDLCALC 104 (H) 08/28/2018      HbgA1c: Last Labs     Hgb A1c MFr Bld (%)  Date Value  04/30/2023 5.3      TSH: Last Labs     TSH (uIU/mL)  Date Value  08/28/2018 1.582        Labs to order: No current labs.   5. Discharge Planning:              -- Social work and case management to assist with discharge planning and identification of hospital follow-up needs prior to discharge             -- Estimated LOS: 3-5d,  8/1-8/3             -- Discharge Concerns: Need to establish a safety plan; Medication compliance and effectiveness             -- Discharge Goals: Return home with outpatient referrals for mental health follow-up including medication management/psychotherapy   I certify that inpatient services furnished can reasonably be expected to improve the patient's condition.   Meryl Dare, MD 06/25/2023, 9:58 AM

## 2023-06-25 NOTE — Group Note (Signed)
Recreation Therapy Group Note   Group Topic:Health and Wellness  Group Date: 06/25/2023 Start Time: 0930 End Time: 1000 Facilitators: Zoriana Oats-McCall, LRT,CTRS Location: [address] Dayroom   Goal Area(s) Addresses:  Patient will identify positive stress management techniques. Patient will identify benefits of using stress management post d/c.     Group Description: Stress Management. LRT and patients discussed the importance of stress management.  Patients were to complete a worksheet that describing their largest source of stress in detail. Patient then needed to list two stressors they are currently experiencing and then circle the symptoms they experience in response to their stress. LRT also played music as patients completed the worksheet.   Affect/Mood: N/A   Participation Level: Did not attend    Clinical Observations/Individualized Feedback:    Plan: Continue to engage patient in RT group sessions 2-3x/week.   Kareem Aul-McCall, LRT,CTRS 06/25/2023 1:11 PM

## 2023-06-26 DIAGNOSIS — F332 Major depressive disorder, recurrent severe without psychotic features: Secondary | ICD-10-CM | POA: Diagnosis not present

## 2023-06-26 MED ORDER — HYDROXYZINE HCL 25 MG PO TABS
25.0000 mg | ORAL_TABLET | Freq: Three times a day (TID) | ORAL | Status: DC
  Administered 2023-06-26 – 2023-06-29 (×9): 25 mg via ORAL
  Filled 2023-06-26 (×12): qty 1

## 2023-06-26 MED ORDER — DIPHENHYDRAMINE HCL 25 MG PO CAPS
25.0000 mg | ORAL_CAPSULE | Freq: Three times a day (TID) | ORAL | Status: DC
  Administered 2023-06-26 – 2023-06-29 (×9): 25 mg via ORAL
  Filled 2023-06-26 (×12): qty 1

## 2023-06-26 MED ORDER — VENLAFAXINE HCL ER 75 MG PO CP24
75.0000 mg | ORAL_CAPSULE | Freq: Every day | ORAL | Status: DC
  Administered 2023-06-27 – 2023-06-28 (×2): 75 mg via ORAL
  Filled 2023-06-26 (×4): qty 1

## 2023-06-26 NOTE — Progress Notes (Signed)
   06/26/23 1100  Psych Admission Type (Psych Patients Only)  Admission Status Voluntary  Psychosocial Assessment  Patient Complaints Depression  Eye Contact Fair  Facial Expression Flat  Affect Depressed  Speech Logical/coherent  Interaction Minimal  Motor Activity Slow  Appearance/Hygiene Disheveled  Behavior Characteristics Appropriate to situation  Mood Depressed  Thought Process  Coherency WDL  Content WDL  Delusions None reported or observed  Perception WDL  Hallucination None reported or observed  Judgment Poor  Confusion None  Danger to Self  Current suicidal ideation? Denies  Danger to Others  Danger to Others None reported or observed

## 2023-06-26 NOTE — Progress Notes (Signed)
[facility] MD Progress Note  06/26/2023 11:18 AM Angel [name]  MRN:  [id number] Subjective:   Angel [name]" Angel [name] is a 42 year old male with past psychiatric history of MDD, GAD; psychiatric hospitalization 2 weeks ago (SI) and numerous in the past (SI/SA); history of self-harm and 1 suicide attempt by wrist cutting.    Case was discussed in the multidisciplinary team. MAR was reviewed and patient is compliant with medications.  Nursing reports 12 hours of sleep and no concerns from them.  Psychiatric Team made the following recommendations yesterday: Continue mirtazapine15 mg once at night for MDD, insomnia Continue trazodone 100 mg as needed once at night for insomnia  On interview today patient reports continued depressive symptoms and suicidal thoughts.  He reports that the suicidal thoughts come and go.  He reports continued anxiety as well.  He reports that the itching is significant still and he is visibly itching himself throughout the interview.  He reports that he is sleeping fine and eating okay.  I informed the patient that our team discussed discharge for him on Sunday following his second dose of ivermectin, unless he had improvement of his psychiatric symptoms and he wanted to leave and in that case we can give him his second ivermectin dose on discharge to take on Sunday.  He reports that following this hospitalization he will go back to [facility] and stay in the tent again.  I educated him on the importance of cleaning anything he was in contact with prior to this hospitalization to avoid scabies re-infection.  He denies needing resources in [facility] including shelters.  I educated him on how [facility] offers showers and laundry and the ability to sleep indoors but he would be around many other people.  He reports that he would rather watch [facility] and stay in a tent, despite the lack of resources.  He reports that he has seen a psychiatrist at [facility] but does not have transportation to go to appointments.  I asked if he can [place] his [place] [facility] and consider doing partial hospitalization program but he reports that he is unable to stay near there and cannot get transportation to there.  We discussed if he had any substance use issues that would make him a candidate for [facility] but he reports that he has very little substance use history and that he is not interested in such a program.   Principal Problem: MDD (major depressive disorder) Diagnosis: Principal Problem:   MDD (major depressive disorder) Active Problems:   Generalized anxiety disorder   MDD (major depressive disorder), recurrent severe, without psychosis (HCC)   Housing instability after recent homelessness   Scabies  Total Time spent with patient: 35 minutes  Past Psychiatric History: MDD, GAD, numerous hospitalizations for suicidal thoughts, severe history of self-harm by cutting and suicide attempt by wrist cutting.  Past Medical History:  Past Medical History:  Diagnosis Date   Anxiety    Depression    History of admission to inpatient psychiatry department 05/03/2023   Migraine    Suicidal ideation 05/03/2023   History reviewed. No pertinent surgical history. Family History:  Family History  Problem Relation Age of Onset   Cancer Mother        brain   Diabetes Brother    Hypertension Brother    Family Psychiatric  History: No reported mental health conditions or substance use in the family except for MDD in second-degree relative. Social History:  Social History   Substance and Sexual Activity  Alcohol Use No     Social History   Substance and Sexual Activity  Drug Use Not Currently   Comment: denies use in the last month    Social History   Socioeconomic History   Marital status: Single    Spouse name: Not on file   Number of children: Not on file   Years of education: Not on file   Highest education level: Not on file   Occupational History   Not on file  Tobacco Use   Smoking status: Every Day    Current packs/day: 1.50    Average packs/day: 1.5 packs/day for 20.0 years (30.0 ttl pk-yrs)    Types: Cigarettes   Smokeless tobacco: Never  Vaping Use   Vaping status: Never Used  Substance and Sexual Activity   Alcohol use: No   Drug use: Not Currently    Comment: denies use in the last month   Sexual activity: Not Currently    Birth control/protection: Condom  Other Topics Concern   Not on file  Social History Narrative   Not on file   Social Determinants of Health   Financial Resource Strain: Not on file  Food Insecurity: Food Insecurity Present (06/20/2023)   Hunger Vital Sign    Worried About Running Out of Food in the Last Year: Often true    Ran Out of Food in the Last Year: Often true  Transportation Needs: Unmet Transportation Needs (06/20/2023)   PRAPARE - Administrator, Civil Service (Medical): Yes    Lack of Transportation (Non-Medical): Yes  Physical Activity: Not on file  Stress: Not on file  Social Connections: Not on file   Additional Social History:   Childhood (bring, raised, lives now, parents, siblings, schooling, education): Does not go into details on his childhood but has a very poor relationship with his family currently.  Reports having a brother. Abuse: Does not report any physical, emotional, sexual abuse anytime in life. Marital Status: Single Sexual orientation: Did not report Children: No children Employment: Currently unemployed.  Did not go into detail about his past employment Peer Group: Reports having no social support or peer group. Housing: Also living a house with his previous friends when they abandoned him and he was forced to leave the house. Finances: Significant financial issues Legal: No current legal issues. Military: Did not ask the patient.    Sleep: Poor secondary to itching and pain throughout the night.  Appetite:   Fair  Current Medications: Current Facility-Administered Medications  Medication Dose Route Frequency Provider Last Rate Last Admin   acetaminophen (TYLENOL) tablet 650 mg  650 mg Oral Q6H PRN Abbott Pao, Nadir, MD       alum & mag hydroxide-simeth (MAALOX/MYLANTA) 200-200-20 MG/5ML suspension 30 mL  30 mL Oral Q4H PRN Abbott Pao, Nadir, MD       haloperidol (HALDOL) tablet 5 mg  5 mg Oral Q4H PRN Abbott Pao, Nadir, MD       And   LORazepam (ATIVAN) tablet 2 mg  2 mg Oral Q4H PRN Abbott Pao, Nadir, MD       And   benztropine (COGENTIN) tablet 1 mg  1 mg Oral Q4H PRN Attiah, Nadir, MD       haloperidol lactate (HALDOL) injection 5 mg  5 mg Intramuscular Q4H PRN Attiah, Nadir, MD       And   LORazepam (ATIVAN) injection 2 mg  2 mg Intramuscular Q4H PRN Attiah, Nadir,  MD       And   benztropine mesylate (COGENTIN) injection 1 mg  1 mg Intramuscular Q4H PRN Abbott Pao, Nadir, MD       diphenhydrAMINE (BENADRYL) capsule 25 mg  25 mg Oral TID Meryl Dare, MD       hydrocortisone cream 1 %   Topical BID Margaretmary Dys, MD   Given at 06/26/23 4098   hydrOXYzine (ATARAX) tablet 25 mg  25 mg Oral TID PRN Sarita Bottom, MD   25 mg at 06/25/23 1725   hydrOXYzine (ATARAX) tablet 25 mg  25 mg Oral TID Meryl Dare, MD       Melene Muller ON 07/01/2023] ivermectin (STROMECTOL) tablet 10,500 mcg  150 mcg/kg Oral Once Abbott Pao, Nadir, MD       loratadine (CLARITIN) tablet 10 mg  10 mg Oral Daily Attiah, Nadir, MD   10 mg at 06/26/23 1191   magnesium hydroxide (MILK OF MAGNESIA) suspension 30 mL  30 mL Oral Daily PRN Abbott Pao, Nadir, MD       mirtazapine (REMERON) tablet 15 mg  15 mg Oral QHS Attiah, Nadir, MD   15 mg at 06/25/23 2203   nicotine (NICODERM CQ - dosed in mg/24 hours) patch 14 mg  14 mg Transdermal Daily Attiah, Nadir, MD   14 mg at 06/26/23 4782   traZODone (DESYREL) tablet 100 mg  100 mg Oral QHS PRN Abbott Pao, Nadir, MD   100 mg at 06/25/23 2202   [START ON 06/27/2023] venlafaxine XR (EFFEXOR-XR) 24 hr  capsule 75 mg  75 mg Oral Q breakfast Meryl Dare, MD        Lab Results:  No results found for this or any previous visit (from the past 48 hour(s)).   Blood Alcohol level:  Lab Results  Component Value Date   ETH <10 06/20/2023   ETH <10 06/03/2023    Metabolic Disorder Labs: Lab Results  Component Value Date   HGBA1C 5.3 04/30/2023   MPG 105 04/30/2023   MPG 105 08/28/2018   Lab Results  Component Value Date   PROLACTIN 6.7 08/28/2018   PROLACTIN 17.3 (H) 05/17/2017   Lab Results  Component Value Date   CHOL 168 04/30/2023   TRIG 108 04/30/2023   HDL 55 04/30/2023   CHOLHDL 3.1 04/30/2023   VLDL 22 04/30/2023   LDLCALC 91 04/30/2023   LDLCALC 104 (H) 08/28/2018    Musculoskeletal: Strength & Muscle Tone: within normal limits Gait & Station: normal Patient leans: N/A  Psychiatric Specialty Exam:  Presentation  General Appearance:  Disheveled below average hygiene Eye Contact: Limited Speech: Decreased amount, decreased tone and volume  Handedness: Right   Mood and Affect  Mood: Depressed; Anxious  Affect: Appropriate; Congruent   Thought Process  Thought Processes: Coherent  Descriptions of Associations:Intact  Orientation:Full (Time, Place and Person)  Thought Content:Logical  History of Schizophrenia/Schizoaffective disorder:No  Duration of Psychotic Symptoms:No data recorded Hallucinations:Hallucinations: None   Ideas of Reference:None  Suicidal Thoughts: Admits to passive SI but denies active SI intention or plan Homicidal Thoughts:Homicidal Thoughts: No    Sensorium  Memory: Immediate Good; Recent Good; Remote Good  Judgment: Fair  Insight: Poor  Executive Functions  Concentration: Good  Attention Span: Good  Recall: Good  Fund of Knowledge: Good  Language: Good   Psychomotor Activity  Psychomotor Activity: Psychomotor Activity: Normal    Assets  Assets: Desire for  Improvement   Sleep  Sleep: Reports poor sleep last night, will follow  Skin:    Comments: Numerous 3 to 5 mm papules throughout the arms back axillae and legs, receiving treatment for scabies Physical Exam Vitals and nursing note reviewed.  HENT:     Head: Normocephalic and atraumatic.  Pulmonary:     Effort: Pulmonary effort is normal.  Skin:    Comments: Continues to have 3 to 5 mm erythematous papules throughout his arms axillae back and legs.  Neurological:     General: No focal deficit present.     Mental Status: He is alert.    Review of Systems  Constitutional:  Negative for fever.  Cardiovascular:  Negative for chest pain and palpitations.  Gastrointestinal:  Negative for constipation, diarrhea, nausea and vomiting.  Skin:  Positive for itching.  Neurological:  Negative for dizziness, weakness and headaches.  All other systems reviewed and are negative.  Review of Systems  Constitutional:  Negative for chills and fever.  Gastrointestinal:  Negative for abdominal pain, constipation, diarrhea, nausea and vomiting.  Genitourinary:  Negative for dysuria and hematuria.  Blood pressure 102/76, pulse 89, temperature 98.4 F (36.9 C), temperature source Oral, resp. rate 16, height 5\' 8"  (1.727 m), weight 71.2 kg, SpO2 96%. Body mass index is 23.87 kg/m.    ASSESSMENT & PLAN   ASSESSMENT:   Diagnoses / Active Problems: MDD (major depressive disorder) GAD Experiencing helplessness (2 months)   "Mikey" Brintnall is a 42 year old male with a past history of DT, GAD, numerous hospitalizations for suicide ideation and attempt who presents to the behavioral health Hospital for all symptoms of depression, severe anxiety, active suicidal thoughts with intention but no plan.  Furthermore the patient is experiencing homelessness for the first time in his life.   Patient currently meets criteria for MDD and GAD.  We restarted fluoxetine, Remeron, scheduled hydroxyzine, all  of which he has responded in the past.  Given his intolerable living situation, social work will give patient resources for safe shelters in [place] (fortunately there are no shelters in his home county [place]).  Infectious disease was consulted on the patient and they diagnosed scabies and recommended permethrin cream once today and again in a week.  ID also recommended screening testing for syphilis and HIV.  In addition to the permethrin treatment, we hope that shelter resource will prevent him from returning to his tent and [facility] where he got scabies originally.  Patient has been educated on scabies and information around reinfection and treatment and he voices understanding by teach back.  Given the severity of his current psychiatric conditions and comorbid scabies infection, he will likely need 7 days in the behavioral health Hospital, and this will offer him to receive his second dose of permethrin.  Furthermore the patient is malnourished and will receive ensures between meals.  7/26: Sent to ED for R flank pain.   7/27+28: ED ruled out life-threatening causes acute abdomen and reported right flank pain likely related to MSK.  On 7/28 titrated up on mirtazapine and trazodone.  Received ivermectin.  7/29: ID recommends continued contact precaution during hospitalization.   7/30: Continues to have significant depressive symptoms, especially neurovegetative symptoms, and suicidal thoughts.  Stopping fluoxetine and starting Effexor XR and continuing mirtazapine.  Scheduled hydroxyzine and diphenhydramine for persistent pruritus, as needed hydroxyzine also available.  Currently the patient plans to go back to [facility] and with tent.  Educated on the need for cleaning any items he was in contact with before to avoid reinfection of scabies.  We  will continue to reinforce this point given the severity of his scabies.    PLAN: Safety and Monitoring:             --  Involuntary admission to inpatient psychiatric unit for safety, stabilization and treatment             -- Daily contact with patient to assess and evaluate symptoms and progress in treatment             -- Patient's case to be discussed in multi-disciplinary team meeting             -- Observation Level : q15 minute checks             -- Vital signs:  q12 hours             -- Precautions: suicide, elopement, and assault   2. Psychiatric Diagnoses and Treatment:  -- Stop fluoxetine 40 mg once daily for MDD, GAD -- Start Effexor XR 75 mg once daily with breakfast for MDD, GAD             -- Continue mirtazapine 15 mg once at night for MDD, insomnia  -- Start hydroxyzine 25 mg 3 times daily for anxiety, pruritus             -- Continue hydroxyzine 25 mg as needed 3 times daily for anxiety, pruritus             -- Continue trazodone from 100 mg as needed once at night for insomnia   --  The risks/benefits/side-effects/alternatives to this medication were discussed in detail with the patient and time was given for questions. The patient consents to medication trial.              -- Metabolic profile and EKG monitoring obtained while on an atypical antipsychotic. See #4 below for values.              -- Encouraged patient to participate in unit milieu and in scheduled group therapies              -- Short Term Goals: Ability to identify changes in lifestyle to reduce recurrence of condition will improve, Ability to verbalize feelings will improve, Ability to disclose and discuss suicidal ideas, Ability to demonstrate self-control will improve, Ability to identify and develop effective coping behaviors will improve, Ability to maintain clinical measurements within normal limits will improve, Compliance with prescribed medications will improve, and Ability to identify triggers associated with substance abuse/mental health issues will improve             -- Long Term Goals: Improvement in symptoms so as  ready for discharge                3. Medical Issues Being Addressed:              -- Scabies infestation: Switch diphenhydramine 25 mg from as needed to schedule 3 times daily for pruritus.  Next ivermectin dose on 8/4.  Continue loratadine 10 mg once daily for pruritus.  Continue hydroxyzine 1% topical cream 2 times daily for pruritus.             -- Migraines: Tylenol as needed available.     4. Routine and other pertinent labs reviewed:  RPR 7/26: Negative HIV 7/26: Negative  EKG monitoring: QTc: 444   Metabolism / endocrine: BMI: Body mass index is 23.87 kg/m. Prolactin: Recent Labs       Lab Results  Component Value Date    PROLACTIN 6.7 08/28/2018    PROLACTIN 17.3 (H) 05/17/2017      Lipid Panel: Recent Labs       Lab Results  Component Value Date    CHOL 168 04/30/2023    TRIG 108 04/30/2023    HDL 55 04/30/2023    CHOLHDL 3.1 04/30/2023    VLDL 22 04/30/2023    LDLCALC 91 04/30/2023    LDLCALC 104 (H) 08/28/2018      HbgA1c: Last Labs     Hgb A1c MFr Bld (%)  Date Value  04/30/2023 5.3      TSH: Last Labs     TSH (uIU/mL)  Date Value  08/28/2018 1.582        Labs to order: No current labs.   5. Discharge Planning:              -- Social work and case management to assist with discharge planning and identification of hospital follow-up needs prior to discharge             -- Estimated LOS: 5 days, 8/4 when receives second ivermectin dose             -- Discharge Concerns: Need to establish a safety plan; Medication compliance and effectiveness             -- Discharge Goals: Return home with outpatient referrals for mental health follow-up including medication management/psychotherapy   I certify that inpatient services furnished can reasonably be expected to improve the patient's condition.   Meryl Dare, MD 06/26/2023, 11:18 AM

## 2023-06-26 NOTE — Plan of Care (Signed)
  Problem: Coping: Goal: Ability to demonstrate self-control will improve Outcome: Progressing   Problem: Coping: Goal: Coping ability will improve Outcome: Progressing   Problem: Medication: Goal: Compliance with prescribed medication regimen will improve Outcome: Progressing

## 2023-06-26 NOTE — Group Note (Signed)
Recreation Therapy Group Note   Group Topic:Animal Assisted Therapy   Group Date: 06/26/2023 Start Time: 1610 End Time: 1030 Facilitators: Pristine Gladhill-McCall, LRT,CTRS Location: [address] Dayroom   Animal-Assisted Activity (AAA) Program Checklist/Progress Notes Patient Eligibility Criteria Checklist & Daily Group note for Rec Tx Intervention  AAA/T Program Assumption of Risk Form signed by Patient/ or Parent Legal Guardian Yes  Patient understands his/her participation is voluntary Yes   Affect/Mood: N/A   Participation Level: Did not attend    Clinical Observations/Individualized Feedback:     Plan: Continue to engage patient in RT group sessions 2-3x/week.   Nakhia Levitan-McCall, LRT,CTRS 06/26/2023 1:36 PM

## 2023-06-26 NOTE — Progress Notes (Signed)
   06/26/23 0615  15 Minute Checks  Location Bedroom  Visual Appearance Calm  Behavior Composed  Sleep (Behavioral Health Patients Only)  Calculate sleep? (Click Yes once per 24 hr at 0600 safety check) Yes  Documented sleep last 24 hours 12.75

## 2023-06-26 NOTE — Progress Notes (Signed)
    06/26/23 2139  Psych Admission Type (Psych Patients Only)  Admission Status Voluntary  Psychosocial Assessment  Patient Complaints Depression  Eye Contact Fair  Facial Expression Flat  Affect Depressed  Speech Logical/coherent  Interaction Minimal  Motor Activity Slow  Appearance/Hygiene Disheveled  Behavior Characteristics Appropriate to situation  Mood Depressed;Sad  Thought Process  Coherency WDL  Content WDL  Delusions None reported or observed  Perception WDL  Hallucination None reported or observed  Judgment Poor  Confusion None  Danger to Self  Current suicidal ideation? Denies  Agreement Not to Harm Self Yes  Description of Agreement verbal  Danger to Others  Danger to Others None reported or observed

## 2023-06-26 NOTE — BHH Group Notes (Signed)
Adult Psychoeducational Group Note  Date:  06/26/2023 Time:  11:19 AM  Group Topic/Focus:  Goals Group:   The focus of this group is to help patients establish daily goals to achieve during treatment and discuss how the patient can incorporate goal setting into their daily lives to aide in recovery. Orientation:   The focus of this group is to educate the patient on the purpose and policies of crisis stabilization and provide a format to answer questions about their admission.  The group details unit policies and expectations of patients while admitted.  Participation Level:  Did Not Attend  Participation Quality:    Affect:    Cognitive:    Insight:   Engagement in Group:    Modes of Intervention:    Additional Comments:    Sheran Lawless 06/26/2023, 11:19 AM

## 2023-06-26 NOTE — Progress Notes (Signed)
   06/25/23 2203  Psych Admission Type (Psych Patients Only)  Admission Status Voluntary  Psychosocial Assessment  Patient Complaints Anxiety;Depression  Eye Contact Fair  Facial Expression Flat  Affect Depressed  Speech Logical/coherent  Interaction Minimal  Motor Activity Slow  Appearance/Hygiene Disheveled  Behavior Characteristics Cooperative;Appropriate to situation  Mood Depressed;Anxious  Thought Process  Coherency WDL  Content WDL  Delusions None reported or observed  Perception WDL  Hallucination None reported or observed  Judgment Poor  Confusion None  Danger to Self  Current suicidal ideation? Denies  Agreement Not to Harm Self Yes  Description of Agreement verbal  Danger to Others  Danger to Others None reported or observed

## 2023-06-26 NOTE — Progress Notes (Signed)
  During this shift, patient is assessed in his room.  Patient appears anxious and depressed.  Patient remains on contact precautions.  She is observed in her room and also in the hallway, pacing.  Patient continues to appear to be responding to internal stimuli.  Patient is compliant with scheduled medications.  Administered PRN Hydroxyzine and Trazodone per [facility] per patient request.  Patient denies SI/HI/AVH.  Patient is safe on the unit with q15 minute safety checks.

## 2023-06-27 DIAGNOSIS — F332 Major depressive disorder, recurrent severe without psychotic features: Secondary | ICD-10-CM | POA: Diagnosis not present

## 2023-06-27 NOTE — Group Note (Signed)
Recreation Therapy Group Note   Group Topic:Other  Group Date: 06/27/2023 Start Time: 5409 End Time: 1015 Facilitators: Fredi Hurtado-McCall, LRT,CTRS Location: [address] Dayroom   Goal Area(s) Addresses:  Patient will identify positive leisure and recreation activities.  Patient will identify one positive benefit of participation in leisure activities.    Group Description: Music Trivia. Patients were divided in to 2 groups for game play. LRT read song lyrics from songs in the  1990s and 2000s hip hop and R&B from a playing card. Each team takes turns answering the question. If they are unable to answer the question, the other team gets the chance to steal the point. The team with the most points wins the game.    Affect/Mood: N/A   Participation Level: Did not attend    Clinical Observations/Individualized Feedback:     Plan: Continue to engage patient in RT group sessions 2-3x/week.   Acquanetta Cabanilla-McCall, LRT,CTRS 06/27/2023 12:23 PM

## 2023-06-27 NOTE — Progress Notes (Signed)
   06/27/23 0630  15 Minute Checks  Location Bedroom  Visual Appearance Calm  Behavior Sleeping  Sleep (Behavioral Health Patients Only)  Calculate sleep? (Click Yes once per 24 hr at 0600 safety check) Yes  Documented sleep last 24 hours 10.25

## 2023-06-27 NOTE — BHH Counselor (Signed)
BHH/BMU LCSW Progress Note   06/27/2023    2:04 PM  Rosalie Doctor      Type of Note: Homeless Shelters in [place]  CSW provided patient with two different counties shelter list since he is homeless and has thoughts of wanting to be in [place] instead of returning back to [place]. However, patient did share which county he wants to be in.     Signed:   Jacob Moores, MSW, [facility] 06/27/2023 2:04 PM

## 2023-06-27 NOTE — BHH Group Notes (Signed)
BHH Group Notes:  (Nursing/MHT/Case Management/Adjunct)  Date:  06/27/2023  Time:  10:10 AM  Type of Therapy:  Group Topic/ Focus: Goals Group: The focus of this group is to help patients establish daily goals to achieve during treatment and discuss how the patient can incorporate goal setting into their daily lives to aide in recovery.   Participation Level:  Did Not Attend  Summary of Progress/Problems:  Patient did not attend goals group today. Patient was encouraged but refused.   Osvaldo Human R Korinne Greenstein 06/27/2023, 10:10 AM

## 2023-06-27 NOTE — Progress Notes (Signed)
[facility] MD Progress Note  06/27/2023 5:12 PM Angel [name]  MRN:  [id number] Subjective:   Angel [name] is a 42 year old male with past psychiatric history of MDD, GAD; psychiatric hospitalization 2 weeks ago (SI) and numerous in the past (SI/SA); history of self-harm and 1 suicide attempt by wrist cutting.    Case was discussed in the multidisciplinary team. MAR was reviewed and patient is compliant with medications.  Nursing reports no concerns.  Psychiatric Team made the following recommendations yesterday: -- Stop fluoxetine 40 mg once daily for MDD, GAD -- Start Effexor XR 75 mg once daily with breakfast for MDD, GAD             -- Continue mirtazapine 15 mg once at night for MDD, insomnia  -- Start hydroxyzine 25 mg 3 times daily for anxiety, pruritus             -- Continue hydroxyzine 25 mg as needed 3 times daily for anxiety, pruritus             -- Continue trazodone from 100 mg as needed once at night for insomnia  On interview the patient reports continued depressive and anxiety symptoms.  He reports that the itching may be improved since we have scheduled the hydroxyzine and diphenhydramine.  He reports that he has been sleeping a lot.  He reports that his appetite has been fine.  He reports that he would like to eat out of the room although he understands why he needs to be isolated from others.  We discussed the possibility of him being able to go outside by himself accompanied by a staff member.  He reported wanting a crossword puzzle.  He reports that he still wants to go back to [facility] with no the housing resources there are small.   Principal Problem: MDD (major depressive disorder) Diagnosis: Principal Problem:   MDD (major depressive disorder) Active Problems:   Generalized anxiety disorder   MDD (major depressive disorder), recurrent severe, without psychosis (HCC)   Housing instability after recent homelessness   Scabies  Total Time spent with  patient: 35 minutes  Past Psychiatric History: MDD, GAD, numerous hospitalizations for suicidal thoughts, severe history of self-harm by cutting and suicide attempt by wrist cutting.  Past Medical History:  Past Medical History:  Diagnosis Date   Anxiety    Depression    History of admission to inpatient psychiatry department 05/03/2023   Migraine    Suicidal ideation 05/03/2023   History reviewed. No pertinent surgical history. Family History:  Family History  Problem Relation Age of Onset   Cancer Mother        brain   Diabetes Brother    Hypertension Brother    Family Psychiatric  History: No reported mental health conditions or substance use in the family except for MDD in second-degree relative. Social History:  Social History   Substance and Sexual Activity  Alcohol Use No     Social History   Substance and Sexual Activity  Drug Use Not Currently   Comment: denies use in the last month    Social History   Socioeconomic History   Marital status: Single    Spouse name: Not on file   Number of children: Not on file   Years of education: Not on file   Highest education level: Not on file  Occupational History   Not on file  Tobacco Use   Smoking status: Every Day    Current packs/day:  1.50    Average packs/day: 1.5 packs/day for 20.0 years (30.0 ttl pk-yrs)    Types: Cigarettes   Smokeless tobacco: Never  Vaping Use   Vaping status: Never Used  Substance and Sexual Activity   Alcohol use: No   Drug use: Not Currently    Comment: denies use in the last month   Sexual activity: Not Currently    Birth control/protection: Condom  Other Topics Concern   Not on file  Social History Narrative   Not on file   Social Determinants of Health   Financial Resource Strain: Not on file  Food Insecurity: Food Insecurity Present (06/20/2023)   Hunger Vital Sign    Worried About Running Out of Food in the Last Year: Often true    Ran Out of Food in the Last Year:  Often true  Transportation Needs: Unmet Transportation Needs (06/20/2023)   PRAPARE - Administrator, Civil Service (Medical): Yes    Lack of Transportation (Non-Medical): Yes  Physical Activity: Not on file  Stress: Not on file  Social Connections: Not on file   Additional Social History:   Childhood (bring, raised, lives now, parents, siblings, schooling, education): Does not go into details on his childhood but has a very poor relationship with his family currently.  Reports having a brother. Abuse: Does not report any physical, emotional, sexual abuse anytime in life. Marital Status: Single Sexual orientation: Did not report Children: No children Employment: Currently unemployed.  Did not go into detail about his past employment Peer Group: Reports having no social support or peer group. Housing: Also living a house with his previous friends when they abandoned him and he was forced to leave the house. Finances: Significant financial issues Legal: No current legal issues. Military: Did not ask the patient.    Sleep: Poor secondary to itching and pain throughout the night.  Appetite:  Fair  Current Medications: Current Facility-Administered Medications  Medication Dose Route Frequency Provider Last Rate Last Admin   acetaminophen (TYLENOL) tablet 650 mg  650 mg Oral Q6H PRN Abbott Pao, Nadir, MD       alum & mag hydroxide-simeth (MAALOX/MYLANTA) 200-200-20 MG/5ML suspension 30 mL  30 mL Oral Q4H PRN Abbott Pao, Nadir, MD       haloperidol (HALDOL) tablet 5 mg  5 mg Oral Q4H PRN Abbott Pao, Nadir, MD       And   LORazepam (ATIVAN) tablet 2 mg  2 mg Oral Q4H PRN Abbott Pao, Nadir, MD       And   benztropine (COGENTIN) tablet 1 mg  1 mg Oral Q4H PRN Abbott Pao, Nadir, MD       haloperidol lactate (HALDOL) injection 5 mg  5 mg Intramuscular Q4H PRN Abbott Pao, Nadir, MD       And   LORazepam (ATIVAN) injection 2 mg  2 mg Intramuscular Q4H PRN Abbott Pao, Nadir, MD       And   benztropine  mesylate (COGENTIN) injection 1 mg  1 mg Intramuscular Q4H PRN Attiah, Nadir, MD       diphenhydrAMINE (BENADRYL) capsule 25 mg  25 mg Oral TID Meryl Dare, MD   25 mg at 06/27/23 1657   hydrocortisone cream 1 %   Topical BID Margaretmary Dys, MD   1 Application at 06/27/23 1657   hydrOXYzine (ATARAX) tablet 25 mg  25 mg Oral TID PRN Sarita Bottom, MD   25 mg at 06/26/23 2138   hydrOXYzine (ATARAX) tablet 25 mg  25 mg  Oral TID Meryl Dare, MD   25 mg at 06/27/23 1657   [START ON 07/01/2023] ivermectin (STROMECTOL) tablet 10,500 mcg  150 mcg/kg Oral Once Abbott Pao, Nadir, MD       loratadine (CLARITIN) tablet 10 mg  10 mg Oral Daily Attiah, Nadir, MD   10 mg at 06/27/23 0824   magnesium hydroxide (MILK OF MAGNESIA) suspension 30 mL  30 mL Oral Daily PRN Abbott Pao, Nadir, MD       mirtazapine (REMERON) tablet 15 mg  15 mg Oral QHS Attiah, Nadir, MD   15 mg at 06/26/23 2139   nicotine (NICODERM CQ - dosed in mg/24 hours) patch 14 mg  14 mg Transdermal Daily Attiah, Nadir, MD   14 mg at 06/27/23 0827   traZODone (DESYREL) tablet 100 mg  100 mg Oral QHS PRN Abbott Pao, Nadir, MD   100 mg at 06/26/23 2139   venlafaxine XR (EFFEXOR-XR) 24 hr capsule 75 mg  75 mg Oral Q breakfast Meryl Dare, MD   75 mg at 06/27/23 0825    Lab Results:  No results found for this or any previous visit (from the past 48 hour(s)).   Blood Alcohol level:  Lab Results  Component Value Date   ETH <10 06/20/2023   ETH <10 06/03/2023    Metabolic Disorder Labs: Lab Results  Component Value Date   HGBA1C 5.3 04/30/2023   MPG 105 04/30/2023   MPG 105 08/28/2018   Lab Results  Component Value Date   PROLACTIN 6.7 08/28/2018   PROLACTIN 17.3 (H) 05/17/2017   Lab Results  Component Value Date   CHOL 168 04/30/2023   TRIG 108 04/30/2023   HDL 55 04/30/2023   CHOLHDL 3.1 04/30/2023   VLDL 22 04/30/2023   LDLCALC 91 04/30/2023   LDLCALC 104 (H) 08/28/2018    Musculoskeletal: Strength & Muscle  Tone: within normal limits Gait & Station: normal Patient leans: N/A  Psychiatric Specialty Exam:  Presentation  General Appearance:  Disheveled below average hygiene Eye Contact: Limited Speech: Decreased amount, decreased tone and volume  Handedness: Right   Mood and Affect  Mood: Depressed  Affect: Appropriate; Congruent   Thought Process  Thought Processes: Coherent  Descriptions of Associations:Intact  Orientation:Full (Time, Place and Person)  Thought Content:Logical  History of Schizophrenia/Schizoaffective disorder:No  Duration of Psychotic Symptoms:No data recorded Hallucinations:Hallucinations: None   Ideas of Reference:None  Suicidal Thoughts: Admits to passive SI but denies active SI intention or plan Homicidal Thoughts:Homicidal Thoughts: No    Sensorium  Memory: Immediate Good; Recent Good; Remote Good  Judgment: Fair  Insight: Poor  Executive Functions  Concentration: Good  Attention Span: Good  Recall: Good  Fund of Knowledge: Good  Language: Good   Psychomotor Activity  Psychomotor Activity: Psychomotor Activity: Normal    Assets  Assets: Desire for Improvement   Sleep  Sleep: Reports poor sleep last night, will follow      Skin:    Comments: Numerous 3 to 5 mm papules throughout the arms back axillae and legs, receiving treatment for scabies Physical Exam Vitals and nursing note reviewed.  HENT:     Head: Normocephalic and atraumatic.  Pulmonary:     Effort: Pulmonary effort is normal.  Skin:    Comments: Continues to have 3 to 5 mm erythematous papules throughout his arms axillae back and legs.  Neurological:     General: No focal deficit present.     Mental Status: He is alert.    Review of Systems  Constitutional:  Negative for fever.  Cardiovascular:  Negative for chest pain and palpitations.  Gastrointestinal:  Negative for constipation, diarrhea, nausea and vomiting.  Skin:   Positive for itching.  Neurological:  Negative for dizziness, weakness and headaches.  All other systems reviewed and are negative.  Review of Systems  Constitutional:  Negative for chills and fever.  Gastrointestinal:  Negative for abdominal pain, constipation, diarrhea, nausea and vomiting.  Genitourinary:  Negative for dysuria and hematuria.  Blood pressure 93/67, pulse 77, temperature 98.2 F (36.8 C), temperature source Oral, resp. rate 16, height 5\' 8"  (1.727 m), weight 71.2 kg, SpO2 99%. Body mass index is 23.87 kg/m.    ASSESSMENT & PLAN   ASSESSMENT:   Diagnoses / Active Problems: MDD, recurrent, severe GAD Experiencing helplessness (2 months)   "Angel [name] is a 42 year old male with a past history of DT, GAD, numerous hospitalizations for suicide ideation and attempt who presents to the behavioral health Hospital for all symptoms of depression, severe anxiety, active suicidal thoughts with intention but no plan.  Furthermore the patient is experiencing homelessness for the first time in his life.   Patient currently meets criteria for MDD and GAD.  We restarted fluoxetine, Remeron, scheduled hydroxyzine, all of which he has responded in the past.  Given his intolerable living situation, social work will give patient resources for safe shelters in [place] (fortunately there are no shelters in his home county [place]).  Infectious disease was consulted on the patient and they diagnosed scabies and recommended permethrin cream once today and again in a week.  ID also recommended screening testing for syphilis and HIV.  In addition to the permethrin treatment, we hope that shelter resource will prevent him from returning to his tent and [facility] where he got scabies originally.  Patient has been educated on scabies and information around reinfection and treatment and he voices understanding by teach back.  Given the severity of his current psychiatric conditions  and comorbid scabies infection, he will likely need 7 days in the behavioral health Hospital, and this will offer him to receive his second dose of permethrin.  Furthermore the patient is malnourished and will receive ensures between meals.  7/26: Sent to ED for R flank pain.   7/27+28: ED ruled out life-threatening causes acute abdomen and reported right flank pain likely related to MSK.  On 7/28 titrated up on mirtazapine and trazodone.  Received ivermectin.  7/29: ID recommends continued contact precaution during hospitalization.   7/30: Stopped fluoxetine, started venlafaxine.   7/31: Still having significant symptoms of depression anxiety and continued suicidal thoughts.  Tolerating the Effexor okay.  Per hospital policy patient does not need to be on contact precaution once 24 hours has passed since treatment for scabies, so we will discontinue contact precaution. Still on no roommate order which we can reassess tomorrow.  Little improvement with pruritus so we will continue scheduled loratadine, diphenhydramine, hydroxyzine, hydrocortisone 1% cream.    PLAN: Safety and Monitoring:             -- Involuntary admission to inpatient psychiatric unit for safety, stabilization and treatment             -- Daily contact with patient to assess and evaluate symptoms and progress in treatment             -- Patient's case to be discussed in multi-disciplinary team meeting             -- Observation  Level : q15 minute checks             -- Vital signs:  q12 hours             -- Precautions: suicide, elopement, and assault   2. Psychiatric Diagnoses and Treatment:   -- Continue Effexor XR 75 mg once daily with breakfast for MDD, GAD             -- Continue mirtazapine 15 mg once at night for MDD, insomnia  -- Continue hydroxyzine 25 mg 3 times daily for anxiety, pruritus             -- Continue hydroxyzine 25 mg as needed 3 times daily for anxiety, pruritus             -- Continue trazodone  from 100 mg as needed once at night for insomnia   --  The risks/benefits/side-effects/alternatives to this medication were discussed in detail with the patient and time was given for questions. The patient consents to medication trial.              -- Metabolic profile and EKG monitoring obtained while on an atypical antipsychotic. See #4 below for values.              -- Encouraged patient to participate in unit milieu and in scheduled group therapies              -- Short Term Goals: Ability to identify changes in lifestyle to reduce recurrence of condition will improve, Ability to verbalize feelings will improve, Ability to disclose and discuss suicidal ideas, Ability to demonstrate self-control will improve, Ability to identify and develop effective coping behaviors will improve, Ability to maintain clinical measurements within normal limits will improve, Compliance with prescribed medications will improve, and Ability to identify triggers associated with substance abuse/mental health issues will improve             -- Long Term Goals: Improvement in symptoms so as ready for discharge                3. Medical Issues Being Addressed:              -- Scabies infestation: Continue diphenhydramine 25 mg 3 times daily for pruritus.  Next ivermectin dose on 8/4.  Continue loratadine 10 mg once daily for pruritus.  Continue hydrocortisone 1% topical cream 2 times daily for pruritus.             -- Migraines: Tylenol as needed available.     4. Routine and other pertinent labs reviewed:  RPR 7/26: Negative HIV 7/26: Negative  EKG monitoring: QTc: 444   Metabolism / endocrine: BMI: Body mass index is 23.87 kg/m. Prolactin: Recent Labs       Lab Results  Component Value Date    PROLACTIN 6.7 08/28/2018    PROLACTIN 17.3 (H) 05/17/2017      Lipid Panel: Recent Labs       Lab Results  Component Value Date    CHOL 168 04/30/2023    TRIG 108 04/30/2023    HDL 55 04/30/2023    CHOLHDL  3.1 04/30/2023    VLDL 22 04/30/2023    LDLCALC 91 04/30/2023    LDLCALC 104 (H) 08/28/2018      HbgA1c: Last Labs     Hgb A1c MFr Bld (%)  Date Value  04/30/2023 5.3      TSH: Last Labs     TSH (uIU/mL)  Date Value  08/28/2018 1.582        Labs to order: No current labs.   5. Discharge Planning:              -- Social work and case management to assist with discharge planning and identification of hospital follow-up needs prior to discharge             -- Estimated LOS: discharge 8/4 when receives second ivermectin dose             -- Discharge Concerns: Need to establish a safety plan; Medication compliance and effectiveness             -- Discharge Goals: Return home with outpatient referrals for mental health follow-up including medication management/psychotherapy   I certify that inpatient services furnished can reasonably be expected to improve the patient's condition.   Meryl Dare, MD 06/27/2023, 5:12 PM

## 2023-06-27 NOTE — Plan of Care (Signed)
Problem: Safety: Goal: Periods of time without injury will increase Outcome: Progressing   Problem: Medication: Goal: Compliance with prescribed medication regimen will improve Outcome: Progressing   Problem: Self-Concept: Goal: Will verbalize positive feelings about self Outcome: Progressing Pt A & O X3, disheveled with fair eye contact, logical speech and flat affect and depressed mood on interactions. Denies HI, AVH and pain when assessed. Endorsed SI without plan. Rates his anxiety 8/10 and depression 9/10 and current stressor "being homeless, my family don't want to help me". Reports he slept well with good appetite. Remains medication compliant without adverse drug reactions. Off contact precaution as ordered. Emotional support, encouragement and reassurance offered to pt. Visible in milieu for scheduled groups. Off unit for meals and courtyard activity; returned without issues.

## 2023-06-27 NOTE — [facility] Group Notes (Signed)
Spiritual care group on grief and loss facilitated by Chaplain Dyanne Carrel, [facility]  Group Goal: Support / Education around grief and loss  Members engage in facilitated group support and psycho-social education.  Group Description:  Following introductions and group rules, group members engaged in facilitated group dialogue and support around topic of loss, with particular support around experiences of loss in their lives. Group Identified types of loss (relationships / self / things) and identified patterns, circumstances, and changes that precipitate losses. Reflected on thoughts / feelings around loss, normalized grief responses, and recognized variety in grief experience. Group encouraged individual reflection on safe space and on the coping skills that they are already utilizing.  Group drew on Adlerian / Rogerian and narrative framework  Patient Progress: [place] attended group but did not participate.

## 2023-06-27 NOTE — [facility] Group Notes (Signed)
[facility] Group Notes:  (Nursing/MHT/Case Management/Adjunct)  Date:  06/27/2023  Time:  2000  Type of Therapy:   Narcotics Anonymous Meeting  Participation Level:  Active  Participation Quality:  Appropriate and Attentive  Affect:  Flat  Cognitive:  Alert  Insight:  Improving  Engagement in Group:  Engaged  Modes of Intervention:  Education and Support  Summary of Progress/Problems:  Marcille Buffy 06/27/2023, 10:18 PM

## 2023-06-27 NOTE — Progress Notes (Signed)
  During this writer's shift, patient remains on contact precautions.  Patient continues to appear depressed and sad.  Patient is medication compliant.  Patient continues to itch.  Administered PRN Hydroxyzine and Trazodone per [facility] per patient request.  Snacks and juice/water were provided to patient.  Patient is safe on the unit with q 15 minute safety checks.

## 2023-06-28 DIAGNOSIS — F332 Major depressive disorder, recurrent severe without psychotic features: Secondary | ICD-10-CM | POA: Diagnosis not present

## 2023-06-28 MED ORDER — VENLAFAXINE HCL ER 75 MG PO CP24
112.5000 mg | ORAL_CAPSULE | Freq: Every day | ORAL | Status: DC
  Administered 2023-06-29 – 2023-07-01 (×3): 112.5 mg via ORAL
  Filled 2023-06-28 (×4): qty 1

## 2023-06-28 NOTE — Progress Notes (Signed)
Pt reports "fair sleep." He rated his anxiety and depression an 8 on a scale of 0-10 (10 being the worst). He reports having a lot of things on his mind and shares how hard life has been for him lately. He said that he has been homeless and living in a tent. Informed pt that social work can provide him a Nutritional therapist [facility] shelters as well. He identifies some of his coping skills as deep breathing and drawing. He also plans on finding a job upon discharge. 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 Encouraged pt to explore other opportunities like in retail as well as he doesn't think he will be able to physically complete log work anymore. He did attend and participate in wrap-up group. Pt denies SI/HI and AVH. Active listening, reassurance, and support provided. Q 15 min safety checks continue. Pt's safety has been maintained.   06/28/23 1945  Psych Admission Type (Psych Patients Only)  Admission Status Voluntary  Psychosocial Assessment  Patient Complaints Anxiety;Depression;Worrying;Sadness  Eye Contact Fair  Facial Expression Flat;Sad;Worried  Affect Depressed;Sad;Flat  Speech Logical/coherent;Soft  Interaction Assertive  Motor Activity Slow  Appearance/Hygiene Disheveled  Behavior Characteristics Cooperative;Appropriate to situation;Anxious  Mood Depressed;Anxious;Sad  Thought Process  Coherency WDL  Content WDL  Delusions None reported or observed  Perception WDL  Hallucination None reported or observed  Judgment Poor  Confusion None  Danger to Self  Current suicidal ideation? Denies  Self-Injurious Behavior No self-injurious ideation or behavior indicators observed or expressed   Agreement Not to Harm Self Yes  Description of Agreement verbally contracts for safety  Danger to Others  Danger to Others None reported or observed

## 2023-06-28 NOTE — [facility] Group Notes (Signed)
Adult Psychoeducational Group Note  Date:  06/28/2023 Time:  9:48 PM  Group Topic/Focus:  Wrap-Up Group:   The focus of this group is to help patients review their daily goal of treatment and discuss progress on daily workbooks.  Participation Level:  Minimal  Participation Quality:  Inattentive  Affect:  Flat  Cognitive:  Appropriate  Insight: Limited  Engagement in Group:  Engaged  Modes of Intervention:  Discussion  Additional Comments:   Pt states he usually draws in order to relax. Pt rates his anxiety at a 8 and depression at a 10  Vevelyn Pat 06/28/2023, 9:48 PM

## 2023-06-28 NOTE — Progress Notes (Signed)
   06/28/23 0945  Psych Admission Type (Psych Patients Only)  Admission Status Voluntary  Psychosocial Assessment  Patient Complaints Anxiety;Depression  Eye Contact Fair  Facial Expression Flat  Affect Depressed  Speech Logical/coherent  Interaction Minimal  Motor Activity Slow  Appearance/Hygiene Disheveled  Behavior Characteristics Cooperative;Appropriate to situation  Mood Depressed;Sad  Thought Process  Coherency WDL  Content WDL  Delusions None reported or observed  Perception WDL  Hallucination None reported or observed  Judgment Poor  Confusion None  Danger to Self  Current suicidal ideation? Denies  Agreement Not to Harm Self Yes  Description of Agreement verbal  Danger to Others  Danger to Others None reported or observed

## 2023-06-28 NOTE — Group Note (Signed)
LCSW Group Therapy Note  Group Date: 06/28/2023 Start Time: 1100 End Time: 1145   Type of Therapy and Topic:  Group Therapy - Healthy vs Unhealthy Coping Skills  Participation Level:  Did Not Attend   Description of Group The focus of this group was to determine what unhealthy coping techniques typically are used by group members and what healthy coping techniques would be helpful in coping with various problems. Patients were guided in becoming aware of the differences between healthy and unhealthy coping techniques. Patients were asked to identify 2-3 healthy coping skills they would like to learn to use more effectively.  Therapeutic Goals Patients learned that coping is what human beings do all day long to deal with various situations in their lives Patients defined and discussed healthy vs unhealthy coping techniques Patients identified their preferred coping techniques and identified whether these were healthy or unhealthy Patients determined 2-3 healthy coping skills they would like to become more familiar with and use more often. Patients provided support and ideas to each other    Therapeutic Modalities Cognitive Behavioral Therapy Motivational Interviewing  Angel [name] 06/28/2023  12:40 PM

## 2023-06-28 NOTE — Progress Notes (Signed)
   06/28/23 0557  15 Minute Checks  Location Bedroom  Visual Appearance Calm  Behavior Sleeping  Sleep (Behavioral Health Patients Only)  Calculate sleep? (Click Yes once per 24 hr at 0600 safety check) Yes  Documented sleep last 24 hours 8.5

## 2023-06-28 NOTE — Progress Notes (Signed)
[facility] MD Progress Note  06/28/2023 4:15 PM [name]  MRN:  [id number]  Principal Problem: MDD (major depressive disorder) Diagnosis: Principal Problem:   MDD (major depressive disorder) Active Problems:   Generalized anxiety disorder   MDD (major depressive disorder), recurrent severe, without psychosis (HCC)   Housing instability after recent homelessness   Scabies   Reason for Admission:  [name]. Angel Mcgill "[name]", is a 42 yo male with a PPHx of MDD, GAD, tobacco use disorder, chronic suicidality and self-harm, 1 suicide attempt by cutting wrist, and with a psychiatric hospitalization 2 weeks at [facility] prior to this admission for suicidal ideations with plan to hang himself.  Patient has been voluntarily admitted to [facility] on 06/20/2023 for worsening depression and continued suicidal ideation in the setting of medication noncompliance and multiple psychosocial stressors including homelessness.   Yesterday, the psychiatry team made following recommendations:  Continue Effexor XR 75 mg once daily with breakfast for MDD, GAD Continue mirtazapine 15 mg once at night for MDD, insomnia Continue hydroxyzine 25 mg 3 times daily for anxiety, pruritus Continue hydroxyzine 25 mg as needed 3 times daily for anxiety, pruritus Continue trazodone from 100 mg as needed once at night for insomnia   Information Obtained Today During Patient Interview:  Patient evaluated on the unit this a.m., reports a modest improvement in symptoms of depression and anxiety.  Reports his depression has improved from 9 to 8/10 today, with 10 being the worst.  Reports symptoms of hopelessness and helplessness.  He also reports his anxiety is 8/10, improved from yesterday.  Sleep and appetite are adequate.  He continues to report passive suicidal ideation today, but notes it has improved.  He denies any intent or plan, contracts for safety on the unit.  He reports he has benefited from attending group therapy, has been  implementing relaxation techniques such as breathing to help with his depression and anxiety.  He also reports that he is motivated to quit smoking, denies any withdrawal symptoms or cravings today.  Patient denies homicidal ideation.  He denies auditory and visual hallucinations.  Paranoid ideations are not apparent.  Delusional thought processes are not apparent.  Patient reports he is tolerating his currently prescribed psychiatric medications, denies any side effects.  Believes the medication is helping improve his depression, although he is unable to specify.  He denies any somatic complaints.   Pertinent information discussed during bed progression: Per RN, patient slept 8.5 hours overnight.  No acute concerns.   Past Psychiatric Hx: Previous Psych Diagnoses: MDD, GAD Prior inpatient treatment: Hospitalized 2 weeks ago, hospitalized in early June, hospitalized numerous times in the past Current/prior outpatient treatment: None reported Prior rehab hx: None reported Psychotherapy hx: No history History of suicide: 1 suicide attempt by wrist cutting. History of homicide or aggression: No reported history Psychiatric medication history: Fluoxetine which helped his anxiety, Vistaril which helps with anxiety, Remeron which helped his sleep, no reported medications that he reports poor outcomes from Psychiatric medication compliance history: Issues with nonadherence due to housing and financial instability Neuromodulation history: No neuromodulation history Current Psychiatrist: No current or past psychiatrist Current therapist: No current or past therapist   Substance Abuse Hx: Alcohol: No current or past use Tobacco: Occasional use of cigarettes Illicit drugs: No current or past use of illicit substances except occasional cannabis use. Rx drug abuse: No past or current use Rehab hx: No history of substance rehab   Past Medical History: Medical Diagnoses: Migraines treated with  Tylenol extra-strength Home Rx: No home meds Prior Hosp: No reported medical hospitalizations Prior Surgeries/Trauma: Reports no surgeries Head trauma, LOC, concussions, seizures: No history of TBI or seizures.  Allergies: No known drug allergies PCP: No current PCP   Family History: Medical: Does not report any medical family history Psych: Does not report any mental health issues or substance use except for MDD and aunt Psych Rx: No reported medication responses SA/HA: No reported family history of SA or HA Substance use family hx: None reported   Social History: Childhood (bring, raised, lives now, parents, siblings, schooling, education): Does not go into details on his childhood but has a very poor relationship with his family currently.  Reports having a brother. Abuse: Does not report any physical, emotional, sexual abuse anytime in life. Marital Status: Single Sexual orientation: Did not report Children: No children Employment: Currently unemployed.  Did not go into detail about his past employment Peer Group: Reports having no social support or peer group. Housing: Also living a house with his previous friends when they abandoned him and he was forced to leave the house. Finances: Significant financial issues Legal: No current legal issues. Military: Did not ask the patient. Past Medical History:  Past Medical History:  Diagnosis Date   Anxiety    Depression    History of admission to inpatient psychiatry department 05/03/2023   Migraine    Suicidal ideation 05/03/2023   Family History:  Family History  Problem Relation Age of Onset   Cancer Mother        brain   Diabetes Brother    Hypertension Brother    Current Medications: Current Facility-Administered Medications  Medication Dose Route Frequency Provider Last Rate Last Admin   acetaminophen (TYLENOL) tablet 650 mg  650 mg Oral Q6H PRN Abbott Pao, Nadir, MD       alum & mag hydroxide-simeth (MAALOX/MYLANTA)  200-200-20 MG/5ML suspension 30 mL  30 mL Oral Q4H PRN Abbott Pao, Nadir, MD       haloperidol (HALDOL) tablet 5 mg  5 mg Oral Q4H PRN Abbott Pao, Nadir, MD       And   LORazepam (ATIVAN) tablet 2 mg  2 mg Oral Q4H PRN Abbott Pao, Nadir, MD       And   benztropine (COGENTIN) tablet 1 mg  1 mg Oral Q4H PRN Abbott Pao, Nadir, MD       haloperidol lactate (HALDOL) injection 5 mg  5 mg Intramuscular Q4H PRN Abbott Pao, Nadir, MD       And   LORazepam (ATIVAN) injection 2 mg  2 mg Intramuscular Q4H PRN Abbott Pao, Nadir, MD       And   benztropine mesylate (COGENTIN) injection 1 mg  1 mg Intramuscular Q4H PRN Abbott Pao, Nadir, MD       diphenhydrAMINE (BENADRYL) capsule 25 mg  25 mg Oral TID Meryl Dare, MD   25 mg at 06/28/23 1202   hydrocortisone cream 1 %   Topical BID Margaretmary Dys, MD   Given at 06/28/23 0813   hydrOXYzine (ATARAX) tablet 25 mg  25 mg Oral TID PRN Sarita Bottom, MD   25 mg at 06/27/23 2151   hydrOXYzine (ATARAX) tablet 25 mg  25 mg Oral TID Meryl Dare, MD   25 mg at 06/28/23 1202   [START ON 07/01/2023] ivermectin (STROMECTOL) tablet 10,500 mcg  150 mcg/kg Oral Once Attiah, Nadir, MD       loratadine (CLARITIN) tablet 10 mg  10 mg Oral Daily Sarita Bottom, MD  10 mg at 06/28/23 0813   magnesium hydroxide (MILK OF MAGNESIA) suspension 30 mL  30 mL Oral Daily PRN Abbott Pao, Nadir, MD       mirtazapine (REMERON) tablet 15 mg  15 mg Oral QHS Attiah, Nadir, MD   15 mg at 06/27/23 2150   nicotine (NICODERM CQ - dosed in mg/24 hours) patch 14 mg  14 mg Transdermal Daily Attiah, Nadir, MD   14 mg at 06/28/23 0814   traZODone (DESYREL) tablet 100 mg  100 mg Oral QHS PRN Abbott Pao, Nadir, MD   100 mg at 06/27/23 2151   [START ON 06/29/2023] venlafaxine XR (EFFEXOR-XR) 24 hr capsule 112.5 mg  112.5 mg Oral Q breakfast Carrion-Carrero, Kylo Gavin, MD        Lab Results: No results found for this or any previous visit (from the past 48 hour(s)).  Blood Alcohol level:  Lab Results  Component Value  Date   ETH <10 06/20/2023   ETH <10 06/03/2023    Metabolic Labs: Lab Results  Component Value Date   HGBA1C 5.3 04/30/2023   MPG 105 04/30/2023   MPG 105 08/28/2018   Lab Results  Component Value Date   PROLACTIN 6.7 08/28/2018   PROLACTIN 17.3 (H) 05/17/2017   Lab Results  Component Value Date   CHOL 168 04/30/2023   TRIG 108 04/30/2023   HDL 55 04/30/2023   CHOLHDL 3.1 04/30/2023   VLDL 22 04/30/2023   LDLCALC 91 04/30/2023   LDLCALC 104 (H) 08/28/2018    Sleep:Sleep: Good Number of Hours of Sleep: 8.5   Physical Findings: AIMS: No  CIWA:    COWS:     Psychiatric Specialty Exam:  Presentation  General Appearance: Appropriate for Environment; Casual  Eye Contact:Good  Speech:Clear and Coherent; Normal Rate  Speech Volume:Normal  Handedness:Right   Mood and Affect  Mood:Dysphoric ("Still sad, but better")  Affect:Depressed; Appropriate   Thought Process  Thought Processes:Linear; Coherent; Goal Directed  Descriptions of Associations:Intact  Orientation:Full (Time, Place and Person)  Thought Content:Logical  History of Schizophrenia/Schizoaffective disorder:No  Duration of Psychotic Symptoms:No data recorded Hallucinations:Hallucinations: None  Ideas of Reference:None  Suicidal Thoughts:Suicidal Thoughts: Yes, Passive SI Passive Intent and/or Plan: Without Intent; Without Access to Means; Without Plan  Homicidal Thoughts:Homicidal Thoughts: No   Sensorium  Memory:Immediate Fair; Recent Fair; Remote Fair  Judgment:Fair  Insight:Fair   Executive Functions  Concentration:Fair  Attention Span:Fair  Recall:Fair  Fund of Knowledge:Fair  Language:Fair   Psychomotor Activity  Psychomotor Activity:Psychomotor Activity: Normal   Assets  Assets:Communication Skills; Desire for Improvement   Sleep  Sleep:Sleep: Good Number of Hours of Sleep: 8.5    Physical Exam: Physical Exam Vitals and nursing note reviewed.   Constitutional:      General: He is not in acute distress.    Appearance: He is not ill-appearing.  HENT:     Head: Normocephalic and atraumatic.  Pulmonary:     Effort: Pulmonary effort is normal. No respiratory distress.  Musculoskeletal:        General: Normal range of motion.  Neurological:     Mental Status: He is alert.  Psychiatric:        Behavior: Behavior normal.    Review of Systems  Constitutional: Negative.   Cardiovascular: Negative.   Genitourinary: Negative.   Psychiatric/Behavioral:  Positive for depression, hallucinations and suicidal ideas. Negative for memory loss and substance abuse. The patient is nervous/anxious. The patient does not have insomnia.    Blood pressure 93/72, pulse 88, temperature  98 F (36.7 C), temperature source Oral, resp. rate 16, height 5\' 8"  (1.727 m), weight 71.2 kg, SpO2 100%. Body mass index is 23.87 kg/m.  Treatment Plan Summary: Daily contact with patient to assess and evaluate symptoms and progress in treatment and Medication management   ASSESSMENT: Angel [name]. Angel Mcgill "[name]", is a 43 yo male with a PPHx of MDD, GAD, tobacco use disorder, chronic suicidality and self-harm, 1 suicide attempt by cutting wrist, and with a psychiatric hospitalization 2 weeks at [facility] prior to this admission for suicidal ideations with plan to hang himself.  Patient has been voluntarily admitted to [facility] on 06/20/2023 for worsening depression and continued suicidal ideation in the setting of medication noncompliance and multiple psychosocial stressors including homelessness.  7/26: Sent to ED for R flank pain.    7/27+28: ED ruled out life-threatening causes acute abdomen and reported right flank pain likely related to MSK.  On 7/28 titrated up on mirtazapine and trazodone.  Received ivermectin.   7/29: ID recommends continued contact precaution during hospitalization.    7/30: Stopped fluoxetine, started venlafaxine.     Diagnoses / Active  Problems: MDD, recurrent severe, without psychosis GAD  PLAN: Safety and Monitoring:  --  VOLUNTARY admission to inpatient psychiatric unit for safety, stabilization and treatment  -- Daily contact with patient to assess and evaluate symptoms and progress in treatment  -- Patient's case to be discussed in multi-disciplinary team meeting  -- Observation Level : q15 minute checks  -- Vital signs:  q12 hours  -- Precautions: suicide, elopement, and assault  2. Psychiatric Diagnoses and Treatment:  Continue Effexor XR 75 mg today with breakfast for MDD, GAD, with plan to increase to 112.5 mg starting tomorrow 8/2/204 Continue mirtazapine 15 mg once at night for MDD, insomnia Continue hydroxyzine 25 mg 3 times daily for anxiety, pruritus Continue hydroxyzine 25 mg as needed 3 times daily for anxiety, pruritus Continue trazodone from 100 mg as needed once at night for insomnia -- The risks/benefits/side-effects/alternatives to this medication were discussed in detail with the patient and time was given for questions. The patient consents to medication trial.              -- Metabolic profile and EKG monitoring obtained while on an atypical antipsychotic  BMI:  TSH: Lipid Panel:  HbgA1c:  QTc:              -- Encouraged patient to participate in unit milieu and in scheduled group therapies   -- Short Term Goals: Ability to identify and develop effective coping behaviors will improve  -- Long Term Goals: Improvement in symptoms so as ready for discharge    3. Medical Issues Being Addressed:  Tobacco Use Disorder -- Nicotine patch 21mg /24 hours ordered -- Smoking cessation encouraged  Scabies infestation Permethrin topical applied once to the affected areas.  Apply again in 1 week.  Migraines Tylenol as needed available.  4. Discharge Planning:   -- Social work and case management to assist with discharge planning and identification of hospital follow-up needs prior to  discharge  -- Estimated LOS: Tentatively 07/01/2023  -- Discharge Concerns: Need to establish a safety plan; Medication compliance and effectiveness  -- Discharge Goals: Return home with outpatient referrals for mental health follow-up including medication management/psychotherapy   I certify that inpatient services furnished can reasonably be expected to improve the patient's condition.   This note was created using a voice recognition software as a result there may be grammatical errors  inadvertently enclosed that do not reflect the nature of this encounter. Every attempt is made to correct such errors.   Dr. Liston Alba, MD PGY-2, Psychiatry Residency  8/1/20244:15 PM

## 2023-06-28 NOTE — Plan of Care (Signed)
°  Problem: Education: °Goal: Emotional status will improve °Outcome: Progressing °Goal: Mental status will improve °Outcome: Progressing °  °Problem: Activity: °Goal: Interest or engagement in activities will improve °Outcome: Progressing °  °

## 2023-06-28 NOTE — Group Note (Signed)
Date:  06/28/2023 Time:  11:59 AM  Group Topic/Focus:  Goals Group:   The focus of this group is to help patients establish daily goals to achieve during treatment and discuss how the patient can incorporate goal setting into their daily lives to aide in recovery.    Participation Level:  Active  Participation Quality:  Appropriate  Affect:  Appropriate  Cognitive:  Alert  Insight: Appropriate  Engagement in Group:  Engaged  Modes of Intervention:  Discussion  Additional Comments:    Beckie Busing 06/28/2023, 11:59 AM

## 2023-06-28 NOTE — Progress Notes (Signed)
   06/27/23 2150  Psych Admission Type (Psych Patients Only)  Admission Status Voluntary  Psychosocial Assessment  Patient Complaints Anxiety;Depression  Eye Contact Fair  Facial Expression Flat  Affect Depressed  Speech Logical/coherent  Interaction Minimal  Motor Activity Slow  Appearance/Hygiene Disheveled  Behavior Characteristics Cooperative;Appropriate to situation  Mood Depressed;Sad  Thought Process  Coherency WDL  Content WDL  Delusions None reported or observed  Perception WDL  Hallucination None reported or observed  Judgment Poor  Confusion None  Danger to Self  Current suicidal ideation? Denies  Agreement Not to Harm Self Yes  Description of Agreement verbal  Danger to Others  Danger to Others None reported or observed

## 2023-06-29 ENCOUNTER — Encounter (HOSPITAL_COMMUNITY): Payer: Self-pay

## 2023-06-29 DIAGNOSIS — F332 Major depressive disorder, recurrent severe without psychotic features: Secondary | ICD-10-CM

## 2023-06-29 LAB — TSH: TSH: 2.636 u[IU]/mL (ref 0.350–4.500)

## 2023-06-29 MED ORDER — LORAZEPAM 1 MG PO TABS
1.0000 mg | ORAL_TABLET | Freq: Four times a day (QID) | ORAL | Status: DC | PRN
  Administered 2023-06-29: 1 mg via ORAL
  Filled 2023-06-29: qty 1

## 2023-06-29 NOTE — Group Note (Signed)
Recreation Therapy Group Note   Group Topic:Problem Solving  Group Date: 06/29/2023 Start Time: 6045 End Time: 1005 Facilitators: Johnrobert Foti-McCall, LRT,CTRS Location: [address] Dayroom   Goal Area(s) Addresses:  Patient will effectively work in a team with other group members. Patient will verbalize importance of using appropriate problem solving techniques.  Patient will identify positive change associated with effective problem solving skills.    Group Description: Brain Teasers. Patients form groups of no more than 3 people. Patients given two sheets of brain teasers each. Patients work together to figure out the answers to each puzzle. Patients given 20 minutes to work on the brain teasers. LRT will go over the final answers with patients.   Affect/Mood: N/A   Participation Level: Did not attend    Clinical Observations/Individualized Feedback:     Plan: Continue to engage patient in RT group sessions 2-3x/week.   Nicole Defino-McCall, LRT,CTRS  06/29/2023 12:56 PM

## 2023-06-29 NOTE — BH IP Treatment Plan (Signed)
Interdisciplinary Treatment and Diagnostic Plan Update  06/29/2023 Time of Session: 11:15 AM  Angel [name] MRN: [id number]  Principal Diagnosis: MDD (major depressive disorder)  Secondary Diagnoses: Principal Problem:   MDD (major depressive disorder) Active Problems:   Generalized anxiety disorder   MDD (major depressive disorder), recurrent severe, without psychosis (HCC)   Housing instability after recent homelessness   Scabies   Current Medications:  Current Facility-Administered Medications  Medication Dose Route Frequency Provider Last Rate Last Admin   acetaminophen (TYLENOL) tablet 650 mg  650 mg Oral Q6H PRN Abbott Pao, Nadir, MD       alum & mag hydroxide-simeth (MAALOX/MYLANTA) 200-200-20 MG/5ML suspension 30 mL  30 mL Oral Q4H PRN Abbott Pao, Nadir, MD       haloperidol (HALDOL) tablet 5 mg  5 mg Oral Q4H PRN Abbott Pao, Nadir, MD       And   LORazepam (ATIVAN) tablet 2 mg  2 mg Oral Q4H PRN Abbott Pao, Nadir, MD       And   benztropine (COGENTIN) tablet 1 mg  1 mg Oral Q4H PRN Abbott Pao, Nadir, MD       haloperidol lactate (HALDOL) injection 5 mg  5 mg Intramuscular Q4H PRN Abbott Pao, Nadir, MD       And   LORazepam (ATIVAN) injection 2 mg  2 mg Intramuscular Q4H PRN Abbott Pao, Nadir, MD       And   benztropine mesylate (COGENTIN) injection 1 mg  1 mg Intramuscular Q4H PRN Abbott Pao, Nadir, MD       hydrocortisone cream 1 %   Topical BID Margaretmary Dys, MD   Given at 06/29/23 0813   hydrOXYzine (ATARAX) tablet 25 mg  25 mg Oral TID PRN Sarita Bottom, MD   25 mg at 06/29/23 1038   LORazepam (ATIVAN) tablet 1 mg  1 mg Oral Q6H PRN Carrion-Carrero, Margely, MD   1 mg at 06/29/23 1038   magnesium hydroxide (MILK OF MAGNESIA) suspension 30 mL  30 mL Oral Daily PRN Abbott Pao, Nadir, MD       mirtazapine (REMERON) tablet 15 mg  15 mg Oral QHS Attiah, Nadir, MD   15 mg at 06/28/23 2118   nicotine (NICODERM CQ - dosed in mg/24 hours) patch 14 mg  14 mg Transdermal Daily Attiah, Nadir, MD    14 mg at 06/29/23 0813   traZODone (DESYREL) tablet 100 mg  100 mg Oral QHS PRN Abbott Pao, Nadir, MD   100 mg at 06/28/23 2119   venlafaxine XR (EFFEXOR-XR) 24 hr capsule 112.5 mg  112.5 mg Oral Q breakfast Carrion-Carrero, Margely, MD   112.5 mg at 06/29/23 4098   PTA Medications: Medications Prior to Admission  Medication Sig Dispense Refill Last Dose   FLUoxetine (PROZAC) 20 MG capsule Take 2 capsules (40 mg total) by mouth daily. (Patient not taking: Reported on 06/22/2023) 60 capsule 3    OLANZapine (ZYPREXA) 10 MG tablet Take 1 tablet (10 mg total) by mouth at bedtime. (Patient not taking: Reported on 06/22/2023) 30 tablet 3    traZODone (DESYREL) 50 MG tablet Take 1 tablet (50 mg total) by mouth at bedtime. (Patient not taking: Reported on 06/22/2023) 30 tablet 3     Patient Stressors: Financial difficulties   Occupational concerns    Patient Strengths: Ability for insight  Active sense of humor   Treatment Modalities: Medication Management, Group therapy, Case management,  1 to 1 session with clinician, Psychoeducation, Recreational therapy.   Physician Treatment Plan for Primary Diagnosis: MDD (  major depressive disorder) Long Term Goal(s): Improvement in symptoms so as ready for discharge   Short Term Goals: Ability to identify and develop effective coping behaviors will improve  Medication Management: Evaluate patient's response, side effects, and tolerance of medication regimen.  Therapeutic Interventions: 1 to 1 sessions, Unit Group sessions and Medication administration.  Evaluation of Outcomes: Progressing  Physician Treatment Plan for Secondary Diagnosis: Principal Problem:   MDD (major depressive disorder) Active Problems:   Generalized anxiety disorder   MDD (major depressive disorder), recurrent severe, without psychosis (HCC)   Housing instability after recent homelessness   Scabies  Long Term Goal(s): Improvement in symptoms so as ready for discharge   Short  Term Goals: Ability to identify and develop effective coping behaviors will improve     Medication Management: Evaluate patient's response, side effects, and tolerance of medication regimen.  Therapeutic Interventions: 1 to 1 sessions, Unit Group sessions and Medication administration.  Evaluation of Outcomes: Progressing   RN Treatment Plan for Primary Diagnosis: MDD (major depressive disorder) Long Term Goal(s): Knowledge of disease and therapeutic regimen to maintain health will improve  Short Term Goals: Ability to remain free from injury will improve, Ability to verbalize frustration and anger appropriately will improve, Ability to demonstrate self-control, Ability to participate in decision making will improve, Ability to verbalize feelings will improve, Ability to disclose and discuss suicidal ideas, Ability to identify and develop effective coping behaviors will improve, and Compliance with prescribed medications will improve  Medication Management: RN will administer medications as ordered by provider, will assess and evaluate patient's response and provide education to patient for prescribed medication. RN will report any adverse and/or side effects to prescribing provider.  Therapeutic Interventions: 1 on 1 counseling sessions, Psychoeducation, Medication administration, Evaluate responses to treatment, Monitor vital signs and CBGs as ordered, Perform/monitor CIWA, COWS, AIMS and Fall Risk screenings as ordered, Perform wound care treatments as ordered.  Evaluation of Outcomes: Progressing   LCSW Treatment Plan for Primary Diagnosis: MDD (major depressive disorder) Long Term Goal(s): Safe transition to appropriate next level of care at discharge, Engage patient in therapeutic group addressing interpersonal concerns.  Short Term Goals: Engage patient in aftercare planning with referrals and resources, Increase social support, Increase ability to appropriately verbalize feelings,  Increase emotional regulation, Facilitate acceptance of mental health diagnosis and concerns, Facilitate patient progression through stages of change regarding substance use diagnoses and concerns, Identify triggers associated with mental health/substance abuse issues, and Increase skills for wellness and recovery  Therapeutic Interventions: Assess for all discharge needs, 1 to 1 time with Social worker, Explore available resources and support systems, Assess for adequacy in community support network, Educate family and significant other(s) on suicide prevention, Complete Psychosocial Assessment, Interpersonal group therapy.  Evaluation of Outcomes: Progressing   Progress in Treatment: Attending groups: Yes. Participating in groups: Yes. Taking medication as prescribed: Yes. Toleration medication: Yes. Family/Significant other contact made: Yes, individual(s) contacted:  Patient declined  Patient understands diagnosis: Yes. Discussing patient identified problems/goals with staff: Yes. Medical problems stabilized or resolved: Yes. Denies suicidal/homicidal ideation: Yes. Issues/concerns per patient self-inventory: No.   New problem(s) identified: No, Describe:  None reported   New Short Term/Long Term Goal(s):  medication stabilization, elimination of SI thoughts, development of comprehensive mental wellness plan.    Patient Goals:  " medication management and housing "  Discharge Plan or Barriers: Patient recently admitted. CSW will continue to follow and assess for appropriate referrals and possible discharge planning.    Reason  for Continuation of Hospitalization: Anxiety Depression Medication stabilization Suicidal ideation  Estimated Length of Stay: 2-3 days   Last 3 [place] Suicide Severity Risk Score: Flowsheet Row ED from [facility]22/2023 in [facility] Emergency Department at [facility] Most recent reading at 06/22/2023 10:42 AM Admission (Current[facility] Most recent reading at 06/20/2023  5:35 PM ED from 06/20/2023 in [facility] Emergency Department at [facility] Most recent reading at 06/20/2023  7:45 AM  C-SSRS RISK CATEGORY High Risk High Risk Moderate Risk       Last PHQ 2/9 Scores:     No data to display          Scribe for Treatment Team: Beather Arbour 06/29/2023 4:24 PM

## 2023-06-29 NOTE — Progress Notes (Signed)
   06/29/23 0800  Psych Admission Type (Psych Patients Only)  Admission Status Voluntary  Psychosocial Assessment  Patient Complaints Anxiety;Depression  Eye Contact Fair  Facial Expression Flat;Sad;Worried  Affect Depressed;Sad  Speech Logical/coherent  Interaction Assertive  Motor Activity Slow  Appearance/Hygiene Disheveled  Behavior Characteristics Cooperative;Appropriate to situation  Mood Depressed;Anxious  Aggressive Behavior  Effect No apparent injury  Thought Process  Coherency WDL  Content WDL  Delusions None reported or observed  Perception WDL  Hallucination None reported or observed  Judgment Poor  Confusion None  Danger to Self  Current suicidal ideation? Denies  Self-Injurious Behavior No self-injurious ideation or behavior indicators observed or expressed   Agreement Not to Harm Self Yes  Description of Agreement Verbally Contracts for safety  Danger to Others  Danger to Others None reported or observed

## 2023-06-29 NOTE — B[facility] Group Notes (Signed)
Adult Psychoeducational Group Note  Date:  06/29/2023 Time:  1:14 PM  Group Topic/Focus:  Dimensions of Wellness:   The focus of this group is to introduce the topic of wellness and discuss the role each dimension of wellness plays in total health. Goals Group:   The focus of this group is to help patients establish daily goals to achieve during treatment and discuss how the patient can incorporate goal setting into their daily lives to aide in recovery.  Participation Level:  Minimal  Participation Quality:  Attentive  Affect:  Appropriate  Cognitive:  Appropriate  Insight: Good  Engagement in Group:  Engaged  Modes of Intervention:  Discussion and Exploration  Additional Comments:  Pt participated in group. Pt stated his goal is to stay awake today and participate. Facilitator provided group with "Who am I? Who do I want to be? Worksheets. Pt actively participated in discussion and topic of self-exploration.    Anselma Herbel 06/29/2023, 1:14 PM

## 2023-06-29 NOTE — Progress Notes (Signed)
   06/29/23 2131  Psych Admission Type (Psych Patients Only)  Admission Status Voluntary  Psychosocial Assessment  Patient Complaints Anxiety;Depression  Eye Contact Fair  Facial Expression Flat;Sad;Worried  Affect Depressed;Sad;Flat  Speech Logical/coherent  Interaction Assertive  Motor Activity Slow  Appearance/Hygiene Disheveled  Behavior Characteristics Cooperative;Appropriate to situation  Mood Depressed;Anxious;Sad;Pleasant  Aggressive Behavior  Effect No apparent injury  Thought Process  Coherency WDL  Content WDL  Delusions None reported or observed  Perception WDL  Hallucination None reported or observed  Judgment Poor  Confusion None  Danger to Self  Current suicidal ideation? Denies  Self-Injurious Behavior No self-injurious ideation or behavior indicators observed or expressed   Agreement Not to Harm Self Yes  Description of Agreement verbal  Danger to Others  Danger to Others None reported or observed

## 2023-06-29 NOTE — Progress Notes (Signed)
   06/29/23 0600  15 Minute Checks  Location Bedroom  Visual Appearance Calm  Behavior Sleeping  Sleep (Behavioral Health Patients Only)  Calculate sleep? (Click Yes once per 24 hr at 0600 safety check) Yes  Documented sleep last 24 hours 8.25

## 2023-06-29 NOTE — Progress Notes (Signed)
[facility] MD Progress Note  06/29/2023 10:09 AM Angel [name]  MRN:  [id number]  Principal Problem: MDD (major depressive disorder) Diagnosis: Principal Problem:   MDD (major depressive disorder) Active Problems:   Generalized anxiety disorder   MDD (major depressive disorder), recurrent severe, without psychosis (HCC)   Housing instability after recent homelessness   Scabies   Reason for Admission:  Angel Hetz. [name], is a 42 yo male with a PPHx of MDD, GAD, tobacco use disorder, chronic suicidality and self-harm, 1 suicide attempt by cutting wrist, and with a psychiatric hospitalization 2 weeks at [facility] prior to this admission for suicidal ideations with plan to hang himself.  Patient has been voluntarily admitted to [facility] on 06/20/2023 for worsening depression and continued suicidal ideation in the setting of medication noncompliance and multiple psychosocial stressors including homelessness.   Yesterday, the psychiatry team made following recommendations:  Continue Effexor XR 75 mg today with breakfast for MDD, GAD, with plan to increase to 112.5 mg starting 8/2/204 Continue mirtazapine 15 mg once at night for MDD, insomnia Continue hydroxyzine 25 mg 3 times daily for anxiety, pruritus Continue hydroxyzine 25 mg as needed 3 times daily for anxiety, pruritus Continue trazodone from 100 mg as needed once at night for insomnia   Information Obtained Today During Patient Interview:  Patient evaluated on the unit this AM, initially reports he feels "okay", but appears dysphoric, restless, and anxious.  He is unable to identify any significant improvements compared to yesterday, reports he feels the same.  He admits he wished he would have never woken up this morning, continues to report passive suicidal ideation.  He contracts for safety on the unit, denies any intent or plan.  He reports he has been having adequate sleep and appetite.  Patient becomes tearful when discussing his  stressors, his housing instability and not seeing his dog upset him significantly.  He also becomes tearful and upset when discussing his family dynamics, he reports a poor relationship with them and feels betrayed by his sister.  Discussed addition of Ativan as needed to help control his anxiety and distress. Patient denies homicidal ideation.  He denies auditory and visual hallucinations.  Paranoid ideations are not apparent.  Delusional thought processes are not apparent.  Patient reports he is tolerating his currently prescribed psychiatric medications, denies any side effects.  Believes the medication is helping improve his depression, although he is unable to specify.  He denies any somatic complaints.   Pertinent information discussed during bed progression: No acute concerns overnight.  Past Psychiatric Hx: Previous Psych Diagnoses: MDD, GAD Prior inpatient treatment: Hospitalized 2 weeks ago, hospitalized in early June, hospitalized numerous times in the past Current/prior outpatient treatment: None reported Prior rehab hx: None reported Psychotherapy hx: No history History of suicide: 1 suicide attempt by wrist cutting. History of homicide or aggression: No reported history Psychiatric medication history: Fluoxetine which helped his anxiety, Vistaril which helps with anxiety, Remeron which helped his sleep, no reported medications that he reports poor outcomes from Psychiatric medication compliance history: Issues with nonadherence due to housing and financial instability Neuromodulation history: No neuromodulation history Current Psychiatrist: No current or past psychiatrist Current therapist: No current or past therapist   Substance Abuse Hx: Alcohol: No current or past use Tobacco: Occasional use of cigarettes Illicit drugs: No current or past use of illicit substances except occasional cannabis use. Rx drug abuse: No past or current use Rehab hx: No history of substance  rehab  Past Medical History: Medical Diagnoses: Migraines treated with Tylenol extra-strength Home Rx: No home meds Prior Hosp: No reported medical hospitalizations Prior Surgeries/Trauma: Reports no surgeries Head trauma, LOC, concussions, seizures: No history of TBI or seizures.  Allergies: No known drug allergies PCP: No current PCP   Family History: Medical: Does not report any medical family history Psych: Does not report any mental health issues or substance use except for MDD and aunt Psych Rx: No reported medication responses SA/HA: No reported family history of SA or HA Substance use family hx: None reported   Social History: Childhood (bring, raised, lives now, parents, siblings, schooling, education): Does not go into details on his childhood but has a very poor relationship with his family currently.  Reports having a brother. Abuse: Does not report any physical, emotional, sexual abuse anytime in life. Marital Status: Single Sexual orientation: Did not report Children: No children Employment: Currently unemployed.  Did not go into detail about his past employment Peer Group: Reports having no social support or peer group. Housing: Also living a house with his previous friends when they abandoned him and he was forced to leave the house. Finances: Significant financial issues Legal: No current legal issues. Military: Did not ask the patient. Past Medical History:  Past Medical History:  Diagnosis Date   Anxiety    Depression    History of admission to inpatient psychiatry department 05/03/2023   Migraine    Suicidal ideation 05/03/2023   Family History:  Family History  Problem Relation Age of Onset   Cancer Mother        brain   Diabetes Brother    Hypertension Brother    Current Medications: Current Facility-Administered Medications  Medication Dose Route Frequency Provider Last Rate Last Admin   acetaminophen (TYLENOL) tablet 650 mg  650 mg Oral Q6H  PRN Abbott Pao, Nadir, MD       alum & mag hydroxide-simeth (MAALOX/MYLANTA) 200-200-20 MG/5ML suspension 30 mL  30 mL Oral Q4H PRN Abbott Pao, Nadir, MD       haloperidol (HALDOL) tablet 5 mg  5 mg Oral Q4H PRN Abbott Pao, Nadir, MD       And   LORazepam (ATIVAN) tablet 2 mg  2 mg Oral Q4H PRN Abbott Pao, Nadir, MD       And   benztropine (COGENTIN) tablet 1 mg  1 mg Oral Q4H PRN Abbott Pao, Nadir, MD       haloperidol lactate (HALDOL) injection 5 mg  5 mg Intramuscular Q4H PRN Abbott Pao, Nadir, MD       And   LORazepam (ATIVAN) injection 2 mg  2 mg Intramuscular Q4H PRN Abbott Pao, Nadir, MD       And   benztropine mesylate (COGENTIN) injection 1 mg  1 mg Intramuscular Q4H PRN Abbott Pao, Nadir, MD       hydrocortisone cream 1 %   Topical BID Margaretmary Dys, MD   Given at 06/29/23 0813   hydrOXYzine (ATARAX) tablet 25 mg  25 mg Oral TID PRN Sarita Bottom, MD   25 mg at 06/28/23 2118   LORazepam (ATIVAN) tablet 1 mg  1 mg Oral Q6H PRN Carrion-Carrero, Allien Melberg, MD       magnesium hydroxide (MILK OF MAGNESIA) suspension 30 mL  30 mL Oral Daily PRN Attiah, Nadir, MD       mirtazapine (REMERON) tablet 15 mg  15 mg Oral QHS Attiah, Nadir, MD   15 mg at 06/28/23 2118   nicotine (NICODERM CQ - dosed in  mg/24 hours) patch 14 mg  14 mg Transdermal Daily Attiah, Nadir, MD   14 mg at 06/29/23 0813   traZODone (DESYREL) tablet 100 mg  100 mg Oral QHS PRN Abbott Pao, Nadir, MD   100 mg at 06/28/23 2119   venlafaxine XR (EFFEXOR-XR) 24 hr capsule 112.5 mg  112.5 mg Oral Q breakfast Carrion-Carrero, Laraine Samet, MD   112.5 mg at 06/29/23 4098    Lab Results: No results found for this or any previous visit (from the past 48 hour(s)).  Blood Alcohol level:  Lab Results  Component Value Date   ETH <10 06/20/2023   ETH <10 06/03/2023    Metabolic Labs: Lab Results  Component Value Date   HGBA1C 5.3 04/30/2023   MPG 105 04/30/2023   MPG 105 08/28/2018   Lab Results  Component Value Date   PROLACTIN 6.7 08/28/2018    PROLACTIN 17.3 (H) 05/17/2017   Lab Results  Component Value Date   CHOL 168 04/30/2023   TRIG 108 04/30/2023   HDL 55 04/30/2023   CHOLHDL 3.1 04/30/2023   VLDL 22 04/30/2023   LDLCALC 91 04/30/2023   LDLCALC 104 (H) 08/28/2018    Sleep:Sleep: Good Number of Hours of Sleep: 8.5   Physical Findings: AIMS: No  CIWA:    COWS:     Psychiatric Specialty Exam:  Presentation  General Appearance: Appropriate for Environment; Casual  Eye Contact:Good  Speech:Clear and Coherent; Normal Rate  Speech Volume:Normal  Handedness:-- (Not assessed)   Mood and Affect  Mood:-- ("Ok")  Affect:Tearful; Depressed (Anxious)   Thought Process  Thought Processes:Linear; Coherent; Goal Directed  Descriptions of Associations:Intact  Orientation:Full (Time, Place and Person)  Thought Content:Logical; Rumination  History of Schizophrenia/Schizoaffective disorder:No  Duration of Psychotic Symptoms:No data recorded Hallucinations:Hallucinations: None  Ideas of Reference:None  Suicidal Thoughts:Suicidal Thoughts: No SI Passive Intent and/or Plan: Without Intent; Without Access to Means; Without Plan  Homicidal Thoughts:Homicidal Thoughts: No   Sensorium  Memory:Immediate Fair; Recent Fair; Remote Fair  Judgment:Fair  Insight:Fair   Executive Functions  Concentration:Fair  Attention Span:Fair  Recall:Fair  Fund of Knowledge:Fair  Language:Fair   Psychomotor Activity  Psychomotor Activity:Psychomotor Activity: Restlessness   Assets  Assets:Communication Skills; Desire for Improvement   Sleep  Sleep:Sleep: Good Number of Hours of Sleep: 8.5    Physical Exam: Physical Exam Vitals and nursing note reviewed.  Constitutional:      General: He is not in acute distress.    Appearance: He is not ill-appearing.  HENT:     Head: Normocephalic and atraumatic.  Pulmonary:     Effort: Pulmonary effort is normal. No respiratory distress.  Musculoskeletal:         General: Normal range of motion.  Neurological:     Mental Status: He is alert.  Psychiatric:        Behavior: Behavior normal.    Review of Systems  Constitutional: Negative.   Cardiovascular: Negative.   Genitourinary: Negative.   Psychiatric/Behavioral:  Positive for depression, hallucinations and suicidal ideas. Negative for memory loss and substance abuse. The patient is nervous/anxious. The patient does not have insomnia.    Blood pressure 104/72, pulse 90, temperature 98 F (36.7 C), temperature source Oral, resp. rate 16, height 5\' 8"  (1.727 m), weight 71.2 kg, SpO2 100%. Body mass index is 23.87 kg/m.  Treatment Plan Summary: Daily contact with patient to assess and evaluate symptoms and progress in treatment and Medication management   ASSESSMENT: Angel Badolato. [name], is a 42 yo  male with a PPHx of MDD, GAD, tobacco use disorder, chronic suicidality and self-harm, 1 suicide attempt by cutting wrist, and with a psychiatric hospitalization 2 weeks at [facility] prior to this admission for suicidal ideations with plan to hang himself.  Patient has been voluntarily admitted to [facility] on 06/20/2023 for worsening depression and continued suicidal ideation in the setting of medication noncompliance and multiple psychosocial stressors including homelessness.  7/26: Sent to ED for R flank pain.    7/27+28: ED ruled out life-threatening causes acute abdomen and reported right flank pain likely related to MSK.  On 7/28 titrated up on mirtazapine and trazodone.  Received ivermectin.   7/29: ID recommends continued contact precaution during hospitalization.    7/30: Stopped fluoxetine, started venlafaxine.   8/2: Increased venlafaxine to 112.5 mg daily. Ordered TSH.     Diagnoses / Active Problems: MDD, recurrent severe, without psychosis GAD  PLAN: Safety and Monitoring:  --  VOLUNTARY admission to inpatient psychiatric unit for safety, stabilization and treatment  --  Daily contact with patient to assess and evaluate symptoms and progress in treatment  -- Patient's case to be discussed in multi-disciplinary team meeting  -- Observation Level : q15 minute checks  -- Vital signs:  q12 hours  -- Precautions: suicide, elopement, and assault  2. Psychiatric Diagnoses and Treatment:  Start Ativan 1 mg every 6 hours for anxiety Start Effexor XR 112.5 mg daily with breakfast for MDD, GAD Continue mirtazapine 15 mg once at night for MDD, insomnia Continue hydroxyzine 25 mg as needed 3 times daily for anxiety, pruritus Continue trazodone from 100 mg as needed once at night for insomnia BMI: 24.02 kg/m TSH: pending Lipid Panel: WNL on 04/30/2023 HbgA1c: 5.3% on 05/01/2023 QTc: 444 on 06/20/2023             -- Encouraged patient to participate in unit milieu and in scheduled group therapies   -- Short Term Goals: Ability to identify and develop effective coping behaviors will improve  -- Long Term Goals: Improvement in symptoms so as ready for discharge  3. Medical Issues Being Addressed:  Tobacco Use Disorder -- Nicotine patch 21mg /24 hours ordered -- Smoking cessation encouraged  Scabies infestation -- RESOLVED Permethrin topical applied once to the affected areas.  Apply again in 1 week. Discontinue scheduled hydroxyzine, Claritin, and Benadryl  Migraines Tylenol as needed available.  4. Discharge Planning:   -- Social work and case management to assist with discharge planning and identification of hospital follow-up needs prior to discharge  -- Estimated LOS: Tentatively 07/01/2023  -- Discharge Concerns: Need to establish a safety plan; Medication compliance and effectiveness  -- Discharge Goals: Return home with outpatient referrals for mental health follow-up including medication management/psychotherapy   I certify that inpatient services furnished can reasonably be expected to improve the patient's condition.   This note was created using a voice  recognition software as a result there may be grammatical errors inadvertently enclosed that do not reflect the nature of this encounter. Every attempt is made to correct such errors.   Dr. Liston Alba, MD PGY-2, Psychiatry Residency  8/2/202410:09 AM

## 2023-06-29 NOTE — [facility] Group Notes (Signed)
[facility] Group Notes: (Healthy Relationships)  Date: 06/29/23  Time: 2:45pm-3:00pm  Group Topic: Healthy Relationships: the focus of this group is to help patients identify healthy relationships versus unhealthy relationships characteristics.   Participation Level: Active  Participation Quality: Appropriate  Affect: Appropriate  Cognitive: Appropriate  Insight: Appropriate  Engagement in group: Engaged  Modes of intervention: Discussion  Summary of Progress/Problems: Patient attended and participated in group today.   Harless Litten RN  06/29/23 3:24pm

## 2023-06-29 NOTE — [facility] Counselor (Signed)
[facility]/BMU LCSW Progress Note   06/29/2023    4:42 PM  Rosalie Doctor      Type of Note: DC Planning    CSW spoke with patient and asked about his desired location he wanted to DC to and patient shared that he will go back to [place]. CSW did ask patient if he used any substances to see about other options and patient declined.     Signed:   Jacob Moores, MSW, [facility] 06/29/2023 4:42 PM

## 2023-06-29 NOTE — Progress Notes (Signed)
Adult Psychoeducational Group Note  Date:  06/29/2023 Time:  11:08 PM  Group Topic/Focus:  Wrap-Up Group:   The focus of this group is to help patients review their daily goal of treatment and discuss progress on daily workbooks.  Participation Level:  Minimal  Participation Quality:  Appropriate and Attentive  Affect:  Appropriate  Cognitive:  Alert  Insight: Good  Engagement in Group:  Engaged and Limited  Modes of Intervention:  Discussion and Support  Additional Comments:  Pt participated in group a little bit in the beginning but, eventually stopped participating. Pt states goal today, was trying to stay awake all day. Pt states not achieving this goal.  Angel [name] 06/29/2023, 11:08 PM

## 2023-06-29 NOTE — Plan of Care (Signed)

## 2023-06-30 NOTE — [facility] Group Notes (Signed)
[facility] Group Notes:  (Nursing/MHT/Case Management/Adjunct)  Date:  06/30/2023  Time:  9:35 PM  Type of Therapy:   Wrap-up group  Participation Level:  Active  Participation Quality:  Appropriate  Affect:  Appropriate  Cognitive:  Appropriate  Insight:  Appropriate  Engagement in Group:  Engaged  Modes of Intervention:  Education  Summary of Progress/Problems: Goal to stay awake. Day 4/10.   Angel [name] 06/30/2023, 9:35 PM

## 2023-06-30 NOTE — Progress Notes (Addendum)
D. Pt continues to present with a sad affect/ depressed mood,  has been visible in the milieu, observe pacing , minimal interaction with peers. Per pt's self inventory,  pt rated his depression,hopelessness and anxiety a 9/10/9, respectively. Pt currently denies pain, SI/HI and AVH and does not appear to be responding to internal stimuli. A. Labs and vitals monitored. Pt given and educated on medications. Pt supported emotionally and encouraged to express concerns and ask questions.   R. Pt remains safe with 15 minute checks. Will continue POC.      06/30/23 1300  Psych Admission Type (Psych Patients Only)  Admission Status Voluntary  Psychosocial Assessment  Patient Complaints Anxiety;Depression  Eye Contact Fair  Facial Expression Worried;Sad  Affect Depressed  Speech Print production planner Activity Pacing  Appearance/Hygiene Designer, industrial/product Cooperative;Appropriate to situation  Mood Anxious;Depressed  Aggressive Behavior  Effect No apparent injury  Thought Process  Coherency WDL  Content WDL  Delusions None reported or observed  Perception WDL  Hallucination None reported or observed  Judgment Poor  Confusion None  Danger to Self  Current suicidal ideation? Denies  Self-Injurious Behavior No self-injurious ideation or behavior indicators observed or expressed   Agreement Not to Harm Self Yes  Description of Agreement verbal contract for safety  Danger to Others  Danger to Others None reported or observed

## 2023-06-30 NOTE — Progress Notes (Signed)
[place] [facility] MD Progress Note  06/30/2023 2:04 PM [name]  MRN:  [id number]  Principal Problem: MDD (major depressive disorder) Diagnosis: Principal Problem:   MDD (major depressive disorder) Active Problems:   Generalized anxiety disorder   MDD (major depressive disorder), recurrent severe, without psychosis (HCC)   Housing instability after recent homelessness   Scabies   Reason for Admission:  Angel [name]. Angel [name] "[name]", is a 42 yo male with a PPHx of MDD, GAD, tobacco use disorder, chronic suicidality and self-harm, 1 suicide attempt by cutting wrist, and with a psychiatric hospitalization 2 weeks at [facility] prior to this admission for suicidal ideations with plan to hang himself.  Patient has been voluntarily admitted to [facility] on 06/20/2023 for worsening depression and continued suicidal ideation in the setting of medication noncompliance and multiple psychosocial stressors including homelessness.  Yesterday, the psychiatry team made following recommendations:  Start Ativan 1 mg every 6 hours for anxiety Start Effexor XR 112.5 mg daily with breakfast for MDD, GAD Continue mirtazapine 15 mg once at night for MDD, insomnia Continue hydroxyzine 25 mg as needed 3 times daily for anxiety, pruritus Continue trazodone from 100 mg as needed once at night for insomnia  Information Obtained Today During Patient Interview:  Patient evaluated in his room, he reports no significant improvements in his depression today.  He reports he feels "exactly the same".  He notes that his anxiety improved after receiving 1 dose of Ativan yesterday, encouraged to request that anxiety worsens.  He continues to report passive suicidal ideation, without intent or plan.  He contracts for safety on the unit.  He reports that his sleep and appetite are adequate.  Patient denies homicidal ideation.  Denies visual and auditory hallucinations.  He denies paranoid ideations and delusional thought processes.    He denies  any side effects to currently prescribed psychiatric medications.  No somatic complaints.  Reports regular bowel movements.   Pertinent information discussed during bed progression: No acute concerns.  Past Psychiatric Hx: Previous Psych Diagnoses: MDD, GAD Prior inpatient treatment: Hospitalized 2 weeks ago, hospitalized in early June, hospitalized numerous times in the past Current/prior outpatient treatment: None reported Prior rehab hx: None reported Psychotherapy hx: No history History of suicide: 1 suicide attempt by wrist cutting. History of homicide or aggression: No reported history Psychiatric medication history: Fluoxetine which helped his anxiety, Vistaril which helps with anxiety, Remeron which helped his sleep, no reported medications that he reports poor outcomes from Psychiatric medication compliance history: Issues with nonadherence due to housing and financial instability Neuromodulation history: No neuromodulation history Current Psychiatrist: No current or past psychiatrist Current therapist: No current or past therapist   Substance Abuse Hx: Alcohol: No current or past use Tobacco: Occasional use of cigarettes Illicit drugs: No current or past use of illicit substances except occasional cannabis use. Rx drug abuse: No past or current use Rehab hx: No history of substance rehab   Past Medical History: Medical Diagnoses: Migraines treated with Tylenol extra-strength Home Rx: No home meds Prior Hosp: No reported medical hospitalizations Prior Surgeries/Trauma: Reports no surgeries Head trauma, LOC, concussions, seizures: No history of TBI or seizures.  Allergies: No known drug allergies PCP: No current PCP   Family History: Medical: Does not report any medical family history Psych: Does not report any mental health issues or substance use except for MDD and aunt Psych Rx: No reported medication responses SA/HA: No reported family history of SA or HA Substance  use family hx: None  reported   Social History: Childhood (bring, raised, lives now, parents, siblings, schooling, education): Does not go into details on his childhood but has a very poor relationship with his family currently.  Reports having a brother. Abuse: Does not report any physical, emotional, sexual abuse anytime in life. Marital Status: Single Sexual orientation: Did not report Children: No children Employment: Currently unemployed.  Did not go into detail about his past employment Peer Group: Reports having no social support or peer group. Housing: Also living a house with his previous friends when they abandoned him and he was forced to leave the house. Finances: Significant financial issues Legal: No current legal issues. Military: Did not ask the patient. Past Medical History:  Past Medical History:  Diagnosis Date   Anxiety    Depression    History of admission to inpatient psychiatry department 05/03/2023   Migraine    Suicidal ideation 05/03/2023   Family History:  Family History  Problem Relation Age of Onset   Cancer Mother        brain   Diabetes Brother    Hypertension Brother    Current Medications: Current Facility-Administered Medications  Medication Dose Route Frequency Provider Last Rate Last Admin   acetaminophen (TYLENOL) tablet 650 mg  650 mg Oral Q6H PRN Abbott Pao, Nadir, MD       alum & mag hydroxide-simeth (MAALOX/MYLANTA) 200-200-20 MG/5ML suspension 30 mL  30 mL Oral Q4H PRN Abbott Pao, Nadir, MD       haloperidol (HALDOL) tablet 5 mg  5 mg Oral Q4H PRN Abbott Pao, Nadir, MD       And   LORazepam (ATIVAN) tablet 2 mg  2 mg Oral Q4H PRN Abbott Pao, Nadir, MD       And   benztropine (COGENTIN) tablet 1 mg  1 mg Oral Q4H PRN Abbott Pao, Nadir, MD       haloperidol lactate (HALDOL) injection 5 mg  5 mg Intramuscular Q4H PRN Abbott Pao, Nadir, MD       And   LORazepam (ATIVAN) injection 2 mg  2 mg Intramuscular Q4H PRN Abbott Pao, Nadir, MD       And   benztropine  mesylate (COGENTIN) injection 1 mg  1 mg Intramuscular Q4H PRN Abbott Pao, Nadir, MD       hydrocortisone cream 1 %   Topical BID Margaretmary Dys, MD   1 Application at 06/30/23 8295   hydrOXYzine (ATARAX) tablet 25 mg  25 mg Oral TID PRN Sarita Bottom, MD   25 mg at 06/29/23 2054   LORazepam (ATIVAN) tablet 1 mg  1 mg Oral Q6H PRN Carrion-Carrero, Eliyahu Bille, MD   1 mg at 06/29/23 1038   magnesium hydroxide (MILK OF MAGNESIA) suspension 30 mL  30 mL Oral Daily PRN Abbott Pao, Nadir, MD       mirtazapine (REMERON) tablet 15 mg  15 mg Oral QHS Attiah, Nadir, MD   15 mg at 06/29/23 2054   nicotine (NICODERM CQ - dosed in mg/24 hours) patch 14 mg  14 mg Transdermal Daily Attiah, Nadir, MD   14 mg at 06/30/23 0831   traZODone (DESYREL) tablet 100 mg  100 mg Oral QHS PRN Abbott Pao, Nadir, MD   100 mg at 06/29/23 2054   venlafaxine XR (EFFEXOR-XR) 24 hr capsule 112.5 mg  112.5 mg Oral Q breakfast Carrion-Carrero, Verlin Uher, MD   112.5 mg at 06/30/23 0830    Lab Results:  Results for orders placed or performed during the hospital encounter of 06/20/23 (from the past 48  hour(s))  TSH     Status: None   Collection Time: 06/29/23  6:12 PM  Result Value Ref Range   TSH 2.636 0.350 - 4.500 uIU/mL    Comment: Performed by a 3rd Generation assay with a functional sensitivity of <=0.01 uIU/mL. Performed at [facility], [address] [place], [place] [address]     Blood Alcohol level:  Lab Results  Component Value Date   ETH <10 06/20/2023   ETH <10 06/03/2023    Metabolic Labs: Lab Results  Component Value Date   HGBA1C 5.3 04/30/2023   MPG 105 04/30/2023   MPG 105 08/28/2018   Lab Results  Component Value Date   PROLACTIN 6.7 08/28/2018   PROLACTIN 17.3 (H) 05/17/2017   Lab Results  Component Value Date   CHOL 168 04/30/2023   TRIG 108 04/30/2023   HDL 55 04/30/2023   CHOLHDL 3.1 04/30/2023   VLDL 22 04/30/2023   LDLCALC 91 04/30/2023   LDLCALC 104 (H)  08/28/2018    Sleep:Sleep: Fair   Physical Findings: AIMS: No  CIWA:    COWS:     Psychiatric Specialty Exam:   Presentation  General Appearance: Appropriate for Environment; Casual  Eye Contact:Good  Speech:Clear and Coherent; Normal Rate  Speech Volume:Normal  Handedness:-- (not assessed)   Mood and Affect  Mood:-- ("nothing much has changed")  Affect:Depressed; Constricted; Tearful   Thought Process  Thought Processes:Linear; Coherent; Goal Directed  Descriptions of Associations:Intact  Orientation:Full (Time, Place and Person)  Thought Content:Logical  History of Schizophrenia/Schizoaffective disorder:No  Duration of Psychotic Symptoms: N/A Hallucinations:Hallucinations: None  Ideas of Reference:None  Suicidal Thoughts:Suicidal Thoughts: Yes, Passive SI Passive Intent and/or Plan: Without Intent; Without Plan  Homicidal Thoughts:Homicidal Thoughts: No   Sensorium  Memory:Immediate Fair; Recent Fair; Remote Fair  Judgment:Fair  Insight:Fair   Executive Functions  Concentration:Fair  Attention Span:Fair  Recall:Fair  Fund of Knowledge:Fair  Language:Fair   Psychomotor Activity  Psychomotor Activity:Psychomotor Activity: Normal   Assets  Assets:Communication Skills; Desire for Improvement   Sleep  Sleep:Sleep: Fair    Physical Exam: Physical Exam Vitals and nursing note reviewed.  Constitutional:      General: He is not in acute distress.    Appearance: He is not ill-appearing.  HENT:     Head: Normocephalic and atraumatic.  Pulmonary:     Effort: Pulmonary effort is normal. No respiratory distress.  Musculoskeletal:        General: Normal range of motion.  Neurological:     Mental Status: He is alert.  Psychiatric:        Behavior: Behavior normal.    Review of Systems  Constitutional: Negative.   Cardiovascular: Negative.   Genitourinary: Negative.   Psychiatric/Behavioral:  Positive for depression,  hallucinations and suicidal ideas. Negative for memory loss and substance abuse. The patient is nervous/anxious. The patient does not have insomnia.    Blood pressure 99/76, pulse 98, temperature 98.2 F (36.8 C), temperature source Oral, resp. rate 16, height 5\' 8"  (1.727 m), weight 71.2 kg, SpO2 98%. Body mass index is 23.87 kg/m.  Treatment Plan Summary: Daily contact with patient to assess and evaluate symptoms and progress in treatment and Medication management   ASSESSMENT: Hallie Ishida. Angel [name] "[name]", is a 42 yo male with a PPHx of MDD, GAD, tobacco use disorder, chronic suicidality and self-harm, 1 suicide attempt by cutting wrist, and with a psychiatric hospitalization 2 weeks at [facility] prior to this admission for suicidal ideations with plan  to hang himself.  Patient has been voluntarily admitted to [facility] on 06/20/2023 for worsening depression and continued suicidal ideation in the setting of medication noncompliance and multiple psychosocial stressors including homelessness.  7/26: Sent to ED for R flank pain.    7/27+28: ED ruled out life-threatening causes acute abdomen and reported right flank pain likely related to MSK.  On 7/28 titrated up on mirtazapine and trazodone.  Received ivermectin.   7/29: ID recommends continued contact precaution during hospitalization.    7/30: Stopped fluoxetine, started venlafaxine.   8/2: Increased venlafaxine to 112.5 mg daily. Ordered TSH.   8/3:, Notable improvements in depression today.  Diagnoses / Active Problems: MDD, recurrent severe, without psychosis GAD  PLAN: Safety and Monitoring:  --  VOLUNTARY admission to inpatient psychiatric unit for safety, stabilization and treatment  -- Daily contact with patient to assess and evaluate symptoms and progress in treatment  -- Patient's case to be discussed in multi-disciplinary team meeting  -- Observation Level : q15 minute checks  -- Vital signs:  q12 hours  -- Precautions: suicide,  elopement, and assault  2. Psychiatric Diagnoses and Treatment:  Continue Ativan 1 mg every 6 hours as needed for anxiety Continue Effexor XR 112.5 mg daily with breakfast for MDD, GAD Continue mirtazapine 15 mg once at night for MDD, insomnia Continue hydroxyzine 25 mg as needed 3 times daily for anxiety, pruritus Continue trazodone from 100 mg as needed once at night for insomnia BMI: 24.02 kg/m TSH: WNL on 04/30/2023 Lipid Panel: WNL on 04/30/2023 HbgA1c: 5.3% on 05/01/2023 QTc: 444 on 06/20/2023             -- Encouraged patient to participate in unit milieu and in scheduled group therapies   -- Short Term Goals: Ability to identify and develop effective coping behaviors will improve  -- Long Term Goals: Improvement in symptoms so as ready for discharge  3. Medical Issues Being Addressed:  Tobacco Use Disorder -- Nicotine patch 21mg /24 hours ordered -- Smoking cessation encouraged  Scabies infestation -- RESOLVED  Permethrin topical applied once to the affected areas.  Apply again in 1 week. Discontinue scheduled hydroxyzine, Claritin, and Benadryl  Migraines Tylenol as needed available.  4. Discharge Planning:   -- Social work and case management to assist with discharge planning and identification of hospital follow-up needs prior to discharge  -- Estimated LOS: Pending improvement in psychiatric symptoms  -- Discharge Concerns: Need to establish a safety plan; Medication compliance and effectiveness  -- Discharge Goals: Return home with outpatient referrals for mental health follow-up including medication management/psychotherapy   I certify that inpatient services furnished can reasonably be expected to improve the patient's condition.   This note was created using a voice recognition software as a result there may be grammatical errors inadvertently enclosed that do not reflect the nature of this encounter. Every attempt is made to correct such errors.   Dr. Liston Alba, MD PGY-2, Psychiatry Residency  8/3/20242:04 PM

## 2023-06-30 NOTE — Group Note (Signed)
Date:  06/30/2023 Time:  3:41 PM  Group Topic/Focus:  Developing a Wellness Toolbox:   The focus of this group is to help patients develop a "wellness toolbox" with skills and strategies to promote recovery upon discharge. Dimensions of Wellness:   The focus of this group is to introduce the topic of wellness and discuss the role each dimension of wellness plays in total health.    Participation Level:  Did Not Attend  Participation Quality:   Did not attend  Affect:   Did not attend  Cognitive:   Did not attend  Insight: None  Engagement in Group:   Did not attend  Modes of Intervention:   Did not attend  Additional Comments:  n/a  Cherre Blanc 06/30/2023, 3:41 PM

## 2023-06-30 NOTE — Group Note (Unsigned)
Date:  06/30/2023 Time:  9:12 AM  Group Topic/Focus:  Goals Group:   The focus of this group is to help patients establish daily goals to achieve during treatment and discuss how the patient can incorporate goal setting into their daily lives to aide in recovery. Orientation:   The focus of this group is to educate the patient on the purpose and policies of crisis stabilization and provide a format to answer questions about their admission.  The group details unit policies and expectations of patients while admitted.     Participation Level:  {[facility] PARTICIPATION HYQMV:78469}  Participation Quality:  {[facility] PARTICIPATION QUALITY:22265}  Affect:  {[facility] AFFECT:22266}  Cognitive:  {[facility] COGNITIVE:22267}  Insight: {B[facility] Insight2:20797}  Engagement in Group:  {[facility] ENGAGEMENT IN GEXBM:84132}  Modes of Intervention:  {[facility] MODES OF INTERVENTION:22269}  Additional Comments:  ***  Jaquita Rector 06/30/2023, 9:12 AM

## 2023-06-30 NOTE — Progress Notes (Signed)
   06/30/23 2028  Psych Admission Type (Psych Patients Only)  Admission Status Voluntary  Psychosocial Assessment  Patient Complaints Anxiety;Depression  Eye Contact Fair  Facial Expression Anxious;Sad;Worried  Affect Anxious;Depressed;Sad;Flat  Speech Logical/coherent  Interaction Assertive  Motor Activity Pacing  Appearance/Hygiene Disheveled  Behavior Characteristics Cooperative;Appropriate to situation  Mood Anxious;Depressed  Aggressive Behavior  Effect No apparent injury  Thought Process  Coherency WDL  Content WDL  Delusions None reported or observed  Perception WDL  Hallucination None reported or observed  Judgment Poor  Confusion None  Danger to Self  Current suicidal ideation? Denies  Self-Injurious Behavior No self-injurious ideation or behavior indicators observed or expressed   Agreement Not to Harm Self Yes  Description of Agreement verbal  Danger to Others  Danger to Others None reported or observed

## 2023-07-01 DIAGNOSIS — F332 Major depressive disorder, recurrent severe without psychotic features: Secondary | ICD-10-CM | POA: Diagnosis not present

## 2023-07-01 MED ORDER — VENLAFAXINE HCL ER 37.5 MG PO CP24
37.5000 mg | ORAL_CAPSULE | ORAL | Status: AC
  Administered 2023-07-01: 37.5 mg via ORAL
  Filled 2023-07-01 (×2): qty 1

## 2023-07-01 MED ORDER — VENLAFAXINE HCL ER 150 MG PO CP24
150.0000 mg | ORAL_CAPSULE | Freq: Every day | ORAL | Status: DC
  Administered 2023-07-02 – 2023-07-04 (×3): 150 mg via ORAL
  Filled 2023-07-01 (×9): qty 1

## 2023-07-01 NOTE — Progress Notes (Signed)
   07/01/23 1000  Psych Admission Type (Psych Patients Only)  Admission Status Voluntary  Psychosocial Assessment  Patient Complaints Depression  Eye Contact Fair  Facial Expression Sad;Worried  Affect Sad  Speech Logical/coherent  Interaction Assertive  Motor Activity Fidgety  Appearance/Hygiene Disheveled  Behavior Characteristics Cooperative;Appropriate to situation  Mood Depressed;Anxious  Aggressive Behavior  Effect No apparent injury  Thought Process  Coherency WDL  Content WDL  Delusions None reported or observed  Perception WDL  Hallucination None reported or observed  Judgment Poor  Confusion None  Danger to Self  Current suicidal ideation? Passive  Self-Injurious Behavior No self-injurious ideation or behavior indicators observed or expressed   Agreement Not to Harm Self Yes  Description of Agreement agreed to contact staff before acting on harmfu thoughts  Danger to Others  Danger to Others None reported or observed

## 2023-07-01 NOTE — Progress Notes (Signed)
   07/01/23 2306  Psych Admission Type (Psych Patients Only)  Admission Status Voluntary  Psychosocial Assessment  Patient Complaints Depression  Eye Contact Fair  Facial Expression Flat  Affect Appropriate to circumstance  Speech Logical/coherent  Interaction Minimal  Motor Activity Fidgety  Appearance/Hygiene Disheveled  Behavior Characteristics Appropriate to situation  Mood Depressed  Thought Process  Coherency WDL  Content WDL  Delusions None reported or observed  Perception WDL  Hallucination None reported or observed  Judgment Poor  Confusion None  Danger to Self  Current suicidal ideation? Denies  Self-Injurious Behavior No self-injurious ideation or behavior indicators observed or expressed   Agreement Not to Harm Self Yes  Description of Agreement verbal  Danger to Others  Danger to Others None reported or observed

## 2023-07-01 NOTE — [facility] Group Notes (Signed)
[facility] Group Notes:  (Nursing)  Date:  07/01/2023  Time: 1400  Type of Therapy:  Psychoeducational Skills  Participation Level:  Did Not Attend   Shela Nevin 07/01/2023, 4:26 PM

## 2023-07-01 NOTE — Group Note (Signed)
LCSW Group Therapy Note   Group Date: 07/01/2023 Start Time: 1000 End Time: 1100   Type of Therapy and Topic:  Group Therapy: Gratitude  Participation Level:  Minimal   Description of Group:   In this group, patients shared and discussed the importance of acknowledging the elements in their lives for which they are grateful and how this can positively impact their mood.  The group discussed how bringing the positive elements of their lives to the forefront of their minds can help with recovery from any illness, physical or mental.  An Gratitude exercise was done as a group in which a list was made of songs used to  encourage participants to consider other potential positives in their lives.  Therapeutic Goals: Patients will identify one or more item for which they are grateful in each of 6 categories:  people, experiences, things, places, skills, and other. Patients will discuss how it is possible to seek out gratitude in even bad situations. Patients will explore other possible items of gratitude that they could remember.   Summary of Patient Progress:  The patient shared that he is grateful for waking up breathing.  Patient's reaction to the group was a little anxious, but when engaged he would repond.  Therapeutic Modalities:   Solution-Focused Therapy Activity Steffanie Dunn, LCSWA 07/01/2023  2:22 PM

## 2023-07-01 NOTE — Progress Notes (Signed)
[facility] MD Progress Note  07/01/2023 7:45 AM Angel [name]  MRN:  [id number]  Principal Problem: MDD (major depressive disorder) Diagnosis: Principal Problem:   MDD (major depressive disorder) Active Problems:   Generalized anxiety disorder   MDD (major depressive disorder), recurrent severe, without psychosis (HCC)   Housing instability after recent homelessness   Scabies   Reason for Admission:  Angel [name]. Angel Mcgill "Carver [name]", is a 42 yo male with a PPHx of MDD, GAD, tobacco use disorder, chronic suicidality and self-harm, 1 suicide attempt by cutting wrist, and with a psychiatric hospitalization 2 weeks at [facility] prior to this admission for suicidal ideations with plan to hang himself.  Patient has been voluntarily admitted to [facility] on 06/20/2023 for worsening depression and continued suicidal ideation in the setting of medication noncompliance and multiple psychosocial stressors including homelessness.  Yesterday, the psychiatry team made following recommendations:  Continue Ativan 1 mg every 6 hours as needed for anxiety Continue Effexor XR 112.5 mg daily with breakfast for MDD, GAD Continue mirtazapine 15 mg once at night for MDD, insomnia Continue hydroxyzine 25 mg as needed 3 times daily for anxiety, pruritus Continue trazodone from 100 mg as needed once at night for insomnia  Information Obtained Today During Patient Interview:  Patient reports subjective symptoms of depression and hopelessness that are unchanged today.  He continues to report passive suicidal ideation, without intent or plan.  He is open to increasing his dose of Effexor today.  He reports sleep and appetite are adequate.There was noted mood reactivity and improvements in affect when discussing patient's hobbies and interests, he shares that he loves going to the beach.  Discussed coping strategies, including mindfulness and meditation.  Encouraged patient to journal his thoughts, list all of the worries that cause him to  be anxious and depressed  Patient denies homicidal ideations.  He denies hallucinations, paranoia, and delusional thought processes.  He denies side effects to his currently prescribed psychiatric medications.  No somatic complaints.   Pertinent information discussed during bed progression: Appears depressed.  No acute concerns overnight.  Past Psychiatric Hx: Previous Psych Diagnoses: MDD, GAD Prior inpatient treatment: Hospitalized 2 weeks ago, hospitalized in early June, hospitalized numerous times in the past Current/prior outpatient treatment: None reported Prior rehab hx: None reported Psychotherapy hx: No history History of suicide: 1 suicide attempt by wrist cutting. History of homicide or aggression: No reported history Psychiatric medication history: Fluoxetine which helped his anxiety, Vistaril which helps with anxiety, Remeron which helped his sleep, no reported medications that he reports poor outcomes from Psychiatric medication compliance history: Issues with nonadherence due to housing and financial instability Neuromodulation history: No neuromodulation history Current Psychiatrist: No current or past psychiatrist Current therapist: No current or past therapist   Substance Abuse Hx: Alcohol: No current or past use Tobacco: Occasional use of cigarettes Illicit drugs: No current or past use of illicit substances except occasional cannabis use. Rx drug abuse: No past or current use Rehab hx: No history of substance rehab   Past Medical History: Medical Diagnoses: Migraines treated with Tylenol extra-strength Home Rx: No home meds Prior Hosp: No reported medical hospitalizations Prior Surgeries/Trauma: Reports no surgeries Head trauma, LOC, concussions, seizures: No history of TBI or seizures.  Allergies: No known drug allergies PCP: No current PCP   Family History: Medical: Does not report any medical family history Psych: Does not report any mental health issues  or substance use except for MDD and aunt Psych Rx: No reported medication  responses SA/HA: No reported family history of SA or HA Substance use family hx: None reported   Social History: Childhood (bring, raised, lives now, parents, siblings, schooling, education): Does not go into details on his childhood but has a very poor relationship with his family currently.  Reports having a brother. Abuse: Does not report any physical, emotional, sexual abuse anytime in life. Marital Status: Single Sexual orientation: Did not report Children: No children Employment: Currently unemployed.  Did not go into detail about his past employment Peer Group: Reports having no social support or peer group. Housing: Also living a house with his previous friends when they abandoned him and he was forced to leave the house. Finances: Significant financial issues Legal: No current legal issues. Military: Did not ask the patient. Past Medical History:  Past Medical History:  Diagnosis Date   Anxiety    Depression    History of admission to inpatient psychiatry department 05/03/2023   Migraine    Suicidal ideation 05/03/2023   Family History:  Family History  Problem Relation Age of Onset   Cancer Mother        brain   Diabetes Brother    Hypertension Brother    Current Medications: Current Facility-Administered Medications  Medication Dose Route Frequency Provider Last Rate Last Admin   acetaminophen (TYLENOL) tablet 650 mg  650 mg Oral Q6H PRN Abbott Pao, Nadir, MD       alum & mag hydroxide-simeth (MAALOX/MYLANTA) 200-200-20 MG/5ML suspension 30 mL  30 mL Oral Q4H PRN Abbott Pao, Nadir, MD       haloperidol (HALDOL) tablet 5 mg  5 mg Oral Q4H PRN Abbott Pao, Nadir, MD       And   LORazepam (ATIVAN) tablet 2 mg  2 mg Oral Q4H PRN Abbott Pao, Nadir, MD       And   benztropine (COGENTIN) tablet 1 mg  1 mg Oral Q4H PRN Abbott Pao, Nadir, MD       haloperidol lactate (HALDOL) injection 5 mg  5 mg Intramuscular Q4H PRN  Abbott Pao, Nadir, MD       And   LORazepam (ATIVAN) injection 2 mg  2 mg Intramuscular Q4H PRN Abbott Pao, Nadir, MD       And   benztropine mesylate (COGENTIN) injection 1 mg  1 mg Intramuscular Q4H PRN Abbott Pao, Nadir, MD       hydrocortisone cream 1 %   Topical BID Margaretmary Dys, MD   Given at 06/30/23 2106   hydrOXYzine (ATARAX) tablet 25 mg  25 mg Oral TID PRN Sarita Bottom, MD   25 mg at 06/30/23 2106   LORazepam (ATIVAN) tablet 1 mg  1 mg Oral Q6H PRN Carrion-Carrero, Priest Lockridge, MD   1 mg at 06/29/23 1038   magnesium hydroxide (MILK OF MAGNESIA) suspension 30 mL  30 mL Oral Daily PRN Abbott Pao, Nadir, MD       mirtazapine (REMERON) tablet 15 mg  15 mg Oral QHS Attiah, Nadir, MD   15 mg at 06/30/23 2106   nicotine (NICODERM CQ - dosed in mg/24 hours) patch 14 mg  14 mg Transdermal Daily Attiah, Nadir, MD   14 mg at 06/30/23 0831   traZODone (DESYREL) tablet 100 mg  100 mg Oral QHS PRN Abbott Pao, Nadir, MD   100 mg at 06/30/23 2106   venlafaxine XR (EFFEXOR-XR) 24 hr capsule 112.5 mg  112.5 mg Oral Q breakfast Carrion-Carrero, Johanne Mcglade, MD   112.5 mg at 06/30/23 0830    Lab Results:  Results for  orders placed or performed during the hospital encounter of 06/20/23 (from the past 48 hour(s))  TSH     Status: None   Collection Time: 06/29/23  6:12 PM  Result Value Ref Range   TSH 2.636 0.350 - 4.500 uIU/mL    Comment: Performed by a 3rd Generation assay with a functional sensitivity of <=0.01 uIU/mL. Performed at [facility], [address], [place], [place] [address]     Blood Alcohol level:  Lab Results  Component Value Date   ETH <10 06/20/2023   ETH <10 06/03/2023    Metabolic Labs: Lab Results  Component Value Date   HGBA1C 5.3 04/30/2023   MPG 105 04/30/2023   MPG 105 08/28/2018   Lab Results  Component Value Date   PROLACTIN 6.7 08/28/2018   PROLACTIN 17.3 (H) 05/17/2017   Lab Results  Component Value Date   CHOL 168 04/30/2023   TRIG 108  04/30/2023   HDL 55 04/30/2023   CHOLHDL 3.1 04/30/2023   VLDL 22 04/30/2023   LDLCALC 91 04/30/2023   LDLCALC 104 (H) 08/28/2018    Sleep:Sleep: Fair   Physical Findings: AIMS: No  CIWA:    COWS:     Psychiatric Specialty Exam:   Presentation  General Appearance: Appropriate for Environment; Casual  Eye Contact:Good  Speech:Clear and Coherent; Normal Rate  Speech Volume:Normal  Handedness:-- (not assessed)   Mood and Affect  Mood:-- ("nothing much has changed")  Affect:Depressed; Constricted; Tearful   Thought Process  Thought Processes:Linear; Coherent; Goal Directed  Descriptions of Associations:Intact  Orientation:Full (Time, Place and Person)  Thought Content:Logical  History of Schizophrenia/Schizoaffective disorder:No  Duration of Psychotic Symptoms: N/A Hallucinations:Hallucinations: None  Ideas of Reference:None  Suicidal Thoughts:Suicidal Thoughts: Yes, Passive SI Passive Intent and/or Plan: Without Intent; Without Plan  Homicidal Thoughts:Homicidal Thoughts: No   Sensorium  Memory:Immediate Fair; Recent Fair; Remote Fair  Judgment:Fair  Insight:Fair   Executive Functions  Concentration:Fair  Attention Span:Fair  Recall:Fair  Fund of Knowledge:Fair  Language:Fair   Psychomotor Activity  Psychomotor Activity:Psychomotor Activity: Normal   Assets  Assets:Communication Skills; Desire for Improvement   Sleep  Sleep:Sleep: Fair    Physical Exam: Physical Exam Vitals and nursing note reviewed.  Constitutional:      General: He is not in acute distress.    Appearance: He is not ill-appearing.  HENT:     Head: Normocephalic and atraumatic.  Pulmonary:     Effort: Pulmonary effort is normal. No respiratory distress.  Musculoskeletal:        General: Normal range of motion.  Neurological:     Mental Status: He is alert.  Psychiatric:        Behavior: Behavior normal.    Review of Systems  Constitutional:  Negative.   Cardiovascular: Negative.   Genitourinary: Negative.   Psychiatric/Behavioral:  Positive for depression and suicidal ideas. Negative for hallucinations, memory loss and substance abuse. The patient is nervous/anxious. The patient does not have insomnia.    Blood pressure 109/81, pulse 84, temperature 97.7 F (36.5 C), temperature source Oral, resp. rate 18, height 5\' 8"  (1.727 m), weight 71.2 kg, SpO2 99%. Body mass index is 23.87 kg/m.  Treatment Plan Summary: Daily contact with patient to assess and evaluate symptoms and progress in treatment and Medication management   ASSESSMENT: Angel [name]. Angel Mcgill "Carver [name]", is a 42 yo male with a PPHx of MDD, GAD, tobacco use disorder, chronic suicidality and self-harm, 1 suicide attempt by cutting wrist, and with a psychiatric  hospitalization 2 weeks at [facility] prior to this admission for suicidal ideations with plan to hang himself.  Patient has been voluntarily admitted to [facility] on 06/20/2023 for worsening depression and continued suicidal ideation in the setting of medication noncompliance and multiple psychosocial stressors including homelessness.  7/26: Sent to ED for R flank pain.    7/27+28: ED ruled out life-threatening causes acute abdomen and reported right flank pain likely related to MSK.  On 7/28 titrated up on mirtazapine and trazodone.  Received ivermectin.   7/29: ID recommends continued contact precaution during hospitalization.    7/30: Stopped fluoxetine, started venlafaxine.   8/2: Increased venlafaxine to 112.5 mg daily. Ordered TSH.   8/3: (correction to previous progress note) No notable improvements in depression on this day.   8/4: Increasing Effexor today (150 mg starting today), subtle improvement in mood reactivity.  Diagnoses / Active Problems: MDD, recurrent severe, without psychosis GAD  PLAN: Safety and Monitoring:  --  VOLUNTARY admission to inpatient psychiatric unit for safety, stabilization and  treatment  -- Daily contact with patient to assess and evaluate symptoms and progress in treatment  -- Patient's case to be discussed in multi-disciplinary team meeting  -- Observation Level : q15 minute checks  -- Vital signs:  q12 hours  -- Precautions: suicide, elopement, and assault  2. Psychiatric Diagnoses and Treatment:  Continue Ativan 1 mg every 6 hours as needed for anxiety Today increase Effexor XR 112.5 mg to 150 mg daily with breakfast for MDD, GAD Continue mirtazapine 15 mg once at night for MDD, insomnia Continue hydroxyzine 25 mg as needed 3 times daily for anxiety, pruritus Continue trazodone from 100 mg as needed once at night for insomnia BMI: 24.02 kg/m TSH: WNL on 04/30/2023 Lipid Panel: WNL on 04/30/2023 HbgA1c: 5.3% on 05/01/2023 QTc: 444 on 06/20/2023             -- Encouraged patient to participate in unit milieu and in scheduled group therapies   -- Short Term Goals: Ability to identify and develop effective coping behaviors will improve  -- Long Term Goals: Improvement in symptoms so as ready for discharge  3. Medical Issues Being Addressed:  Tobacco Use Disorder -- Nicotine patch 21mg /24 hours ordered -- Smoking cessation encouraged  Scabies infestation -- RESOLVED  Permethrin topical applied once to the affected areas.  Apply again in 1 week. Discontinue scheduled hydroxyzine, Claritin, and Benadryl  Migraines Tylenol as needed available.  4. Discharge Planning:   -- Social work and case management to assist with discharge planning and identification of hospital follow-up needs prior to discharge  -- Estimated LOS: Pending improvement in psychiatric symptoms  -- Discharge Concerns: Need to establish a safety plan; Medication compliance and effectiveness  -- Discharge Goals: Return home with outpatient referrals for mental health follow-up including medication management/psychotherapy   I certify that inpatient services furnished can reasonably be  expected to improve the patient's condition.   This note was created using a voice recognition software as a result there may be grammatical errors inadvertently enclosed that do not reflect the nature of this encounter. Every attempt is made to correct such errors.   Dr. Liston Alba, MD PGY-2, Psychiatry Residency  8/4/20247:45 AM

## 2023-07-01 NOTE — Group Note (Signed)
Date:  07/01/2023 Time:  10:12 AM  Group Topic/Focus:  Goals Group:   The focus of this group is to help patients establish daily goals to achieve during treatment and discuss how the patient can incorporate goal setting into their daily lives to aide in recovery.    Participation Level:  Minimal  Participation Quality:  Appropriate  Affect:  Appropriate  Cognitive:  Appropriate  Insight: Appropriate  Engagement in Group:  Lacking  Modes of Intervention:  Discussion  Additional Comments:     Reymundo Poll 07/01/2023, 10:12 AM

## 2023-07-02 DIAGNOSIS — F332 Major depressive disorder, recurrent severe without psychotic features: Secondary | ICD-10-CM | POA: Diagnosis not present

## 2023-07-02 NOTE — Progress Notes (Signed)
Pt is awake and alert x3.  He has flat affect and depressed mood.  Pt does have good eye contact.  Denies SI, HI or AVH.  Pt was visible in milieu and went off unit for meals.   Pt has been taking medication without prompting.  Q24min checks in place.

## 2023-07-02 NOTE — Progress Notes (Signed)
   07/02/23 0700  15 Minute Checks  Location Cafeteria  Visual Appearance Calm  Behavior Composed  Sleep (Behavioral Health Patients Only)  Calculate sleep? (Click Yes once per 24 hr at 0600 safety check) Yes  Documented sleep last 24 hours 8.75

## 2023-07-02 NOTE — Progress Notes (Signed)
[facility] MD Progress Note  07/02/2023 10:31 AM Angel [name]  MRN:  [id number]  Subjective:   Angel [name]. Angel [name] "Angel [name]", is a 42 yo male with a PPHx of MDD, GAD, tobacco use disorder, chronic suicidality and self-harm, 1 suicide attempt by cutting wrist, and with a psychiatric hospitalization 2 weeks at [facility] prior to this admission for suicidal ideations with plan to hang himself. Patient has been voluntarily admitted to [facility] on 06/20/2023 for worsening depression and continued suicidal ideation in the setting of medication noncompliance and multiple psychosocial stressors including homelessness.   Patient is still depressed, reporting no change in mood since yesterday.  Denies any side effects to increasing the Effexor.  Reports that sleep is better.  Reports appetite is better.  Reports anxiety comes and goes.  Reports SI is off and on, passive, without any intent or plan to harm self.  He reports being bothered by his depression and suicidal thoughts.  Denies any side effects to current psychiatric medications.  Concentration is a little better.    Principal Problem: MDD (major depressive disorder) Diagnosis: Principal Problem:   MDD (major depressive disorder) Active Problems:   Generalized anxiety disorder   MDD (major depressive disorder), recurrent severe, without psychosis (HCC)   Housing instability after recent homelessness   Scabies  Total Time spent with patient: 15 minutes  Past Psychiatric Hx: Previous Psych Diagnoses: MDD, GAD Prior inpatient treatment: Hospitalized 2 weeks ago, hospitalized in early June, hospitalized numerous times in the past Current/prior outpatient treatment: None reported Prior rehab hx: None reported Psychotherapy hx: No history History of suicide: 1 suicide attempt by wrist cutting. History of homicide or aggression: No reported history Psychiatric medication history: Fluoxetine which helped his anxiety, Vistaril which helps with anxiety, Remeron  which helped his sleep, no reported medications that he reports poor outcomes from Psychiatric medication compliance history: Issues with nonadherence due to housing and financial instability Neuromodulation history: No neuromodulation history Current Psychiatrist: No current or past psychiatrist Current therapist: No current or past therapist  Past Medical History:  Past Medical History:  Diagnosis Date   Anxiety    Depression    History of admission to inpatient psychiatry department 05/03/2023   Migraine    Suicidal ideation 05/03/2023   History reviewed. No pertinent surgical history. Family History:  Family History  Problem Relation Age of Onset   Cancer Mother        brain   Diabetes Brother    Hypertension Brother    Family Psychiatric  History: See H&P  Social History:  Social History   Substance and Sexual Activity  Alcohol Use No     Social History   Substance and Sexual Activity  Drug Use Not Currently   Comment: denies use in the last month    Social History   Socioeconomic History   Marital status: Single    Spouse name: Not on file   Number of children: Not on file   Years of education: Not on file   Highest education level: Not on file  Occupational History   Not on file  Tobacco Use   Smoking status: Every Day    Current packs/day: 1.50    Average packs/day: 1.5 packs/day for 20.0 years (30.0 ttl pk-yrs)    Types: Cigarettes   Smokeless tobacco: Never  Vaping Use   Vaping status: Never Used  Substance and Sexual Activity   Alcohol use: No   Drug use: Not Currently    Comment: denies  use in the last month   Sexual activity: Not Currently    Birth control/protection: Condom  Other Topics Concern   Not on file  Social History Narrative   Not on file   Social Determinants of Health   Financial Resource Strain: Not on file  Food Insecurity: Food Insecurity Present (06/20/2023)   Hunger Vital Sign    Worried About Running Out of Food in  the Last Year: Often true    Ran Out of Food in the Last Year: Often true  Transportation Needs: Unmet Transportation Needs (06/20/2023)   PRAPARE - Administrator, Civil Service (Medical): Yes    Lack of Transportation (Non-Medical): Yes  Physical Activity: Not on file  Stress: Not on file  Social Connections: Not on file   Additional Social History:                          Current Medications: Current Facility-Administered Medications  Medication Dose Route Frequency Provider Last Rate Last Admin   acetaminophen (TYLENOL) tablet 650 mg  650 mg Oral Q6H PRN Abbott Pao, Nadir, MD       alum & mag hydroxide-simeth (MAALOX/MYLANTA) 200-200-20 MG/5ML suspension 30 mL  30 mL Oral Q4H PRN Abbott Pao, Nadir, MD       haloperidol (HALDOL) tablet 5 mg  5 mg Oral Q4H PRN Abbott Pao, Nadir, MD       And   LORazepam (ATIVAN) tablet 2 mg  2 mg Oral Q4H PRN Abbott Pao, Nadir, MD       And   benztropine (COGENTIN) tablet 1 mg  1 mg Oral Q4H PRN Abbott Pao, Nadir, MD       haloperidol lactate (HALDOL) injection 5 mg  5 mg Intramuscular Q4H PRN Abbott Pao, Nadir, MD       And   LORazepam (ATIVAN) injection 2 mg  2 mg Intramuscular Q4H PRN Abbott Pao, Nadir, MD       And   benztropine mesylate (COGENTIN) injection 1 mg  1 mg Intramuscular Q4H PRN Abbott Pao, Nadir, MD       hydrocortisone cream 1 %   Topical BID Margaretmary Dys, MD   1 Application at 07/02/23 0827   hydrOXYzine (ATARAX) tablet 25 mg  25 mg Oral TID PRN Sarita Bottom, MD   25 mg at 07/01/23 2324   LORazepam (ATIVAN) tablet 1 mg  1 mg Oral Q6H PRN Carrion-Carrero, Margely, MD   1 mg at 06/29/23 1038   magnesium hydroxide (MILK OF MAGNESIA) suspension 30 mL  30 mL Oral Daily PRN Abbott Pao, Nadir, MD       mirtazapine (REMERON) tablet 15 mg  15 mg Oral QHS Attiah, Nadir, MD   15 mg at 07/01/23 2324   nicotine (NICODERM CQ - dosed in mg/24 hours) patch 14 mg  14 mg Transdermal Daily Attiah, Nadir, MD   14 mg at 07/02/23 0829    traZODone (DESYREL) tablet 100 mg  100 mg Oral QHS PRN Abbott Pao, Nadir, MD   100 mg at 07/01/23 2324   venlafaxine XR (EFFEXOR-XR) 24 hr capsule 150 mg  150 mg Oral Q breakfast Tonnie Friedel, Harrold Donath, MD   150 mg at 07/02/23 1610    Lab Results: No results found for this or any previous visit (from the past 48 hour(s)).  Blood Alcohol level:  Lab Results  Component Value Date   [facility] <10 06/20/2023   ETH <10 06/03/2023    Metabolic Disorder Labs: Lab Results  Component  Value Date   HGBA1C 5.3 04/30/2023   MPG 105 04/30/2023   MPG 105 08/28/2018   Lab Results  Component Value Date   PROLACTIN 6.7 08/28/2018   PROLACTIN 17.3 (H) 05/17/2017   Lab Results  Component Value Date   CHOL 168 04/30/2023   TRIG 108 04/30/2023   HDL 55 04/30/2023   CHOLHDL 3.1 04/30/2023   VLDL 22 04/30/2023   LDLCALC 91 04/30/2023   LDLCALC 104 (H) 08/28/2018    Physical Findings: AIMS:  , ,  ,  ,    CIWA:    COWS:     Musculoskeletal: Strength & Muscle Tone: within normal limits Gait & Station: normal Patient leans: N/A  Psychiatric Specialty Exam:  Presentation  General Appearance:  Casual; Fairly Groomed  Eye Contact: Poor  Speech: Slow  Speech Volume: Decreased  Handedness: -- (not assessed)   Mood and Affect  Mood: Depressed; Anxious  Affect: Constricted   Thought Process  Thought Processes: Linear  Descriptions of Associations:Intact  Orientation:Full (Time, Place and Person)  Thought Content:Logical  History of Schizophrenia/Schizoaffective disorder:No  Duration of Psychotic Symptoms:No data recorded Hallucinations:Hallucinations: None  Ideas of Reference:None  Suicidal Thoughts:Suicidal Thoughts: Yes, Passive SI Active Intent and/or Plan: Without Intent; Without Plan  Homicidal Thoughts:Homicidal Thoughts: No   Sensorium  Memory: Immediate Fair; Recent Fair; Remote Fair  Judgment: Fair  Insight: Fair   Producer, television/film/video: Fair  Attention Span: Fair  Recall: [employer] of Knowledge: Fair  Language: Fair   Psychomotor Activity  Psychomotor Activity: Psychomotor Activity: Normal   Assets  Assets: Communication Skills; Desire for Improvement   Sleep  Sleep: Sleep: Fair    Physical Exam: Physical Exam Vitals reviewed.  Constitutional:      General: He is not in acute distress.    Appearance: He is normal weight. He is not toxic-appearing.  Pulmonary:     Effort: Pulmonary effort is normal. No respiratory distress.  Neurological:     Mental Status: He is alert.     Motor: No weakness.     Gait: Gait normal.  Psychiatric:        Behavior: Behavior normal.        Judgment: Judgment normal.    Review of Systems  Constitutional:  Negative for chills and fever.  Cardiovascular:  Negative for chest pain and palpitations.  Neurological:  Negative for dizziness, tingling, tremors and headaches.  Psychiatric/Behavioral:  Positive for depression and suicidal ideas. Negative for hallucinations, memory loss and substance abuse. The patient is nervous/anxious. The patient does not have insomnia.   All other systems reviewed and are negative.  Blood pressure 105/75, pulse 82, temperature 97.7 F (36.5 C), temperature source Oral, resp. rate 18, height 5\' 8"  (1.727 m), weight 71.2 kg, SpO2 99%. Body mass index is 23.87 kg/m.   Treatment Plan Summary: Daily contact with patient to assess and evaluate symptoms and progress in treatment and Medication management   Diagnoses / Active Problems: MDD, recurrent severe, without psychosis GAD   PLAN: Safety and Monitoring:             --  VOLUNTARY admission to inpatient psychiatric unit for safety, stabilization and treatment             -- Daily contact with patient to assess and evaluate symptoms and progress in treatment             -- Patient's case to be discussed in multi-disciplinary team meeting             --  Observation Level : q15 minute checks             -- Vital signs:  q12 hours             -- Precautions: suicide, elopement, and assault   2. Psychiatric Diagnoses and Treatment:  Continue Ativan 1 mg every 6 hours as needed for anxiety Continue Effexor XR 150 mg daily with breakfast for MDD, GAD Continue mirtazapine 15 mg once at night for MDD, insomnia Continue hydroxyzine 25 mg as needed 3 times daily for anxiety, pruritus Continue trazodone from 100 mg as needed once at night for insomnia BMI: 24.02 kg/m TSH: WNL on 04/30/2023 Lipid Panel: WNL on 04/30/2023 HbgA1c: 5.3% on 05/01/2023 QTc: 444 on 06/20/2023             -- Encouraged patient to participate in unit milieu and in scheduled group therapies              -- Short Term Goals: Ability to identify and develop effective coping behaviors will improve             -- Long Term Goals: Improvement in symptoms so as ready for discharge   3. Medical Issues Being Addressed:  Tobacco Use Disorder -- Nicotine patch 21mg /24 hours ordered -- Smoking cessation encouraged   Scabies infestation -- RESOLVED  Permethrin topical applied once to the affected areas.  Apply again in 1 week. Discontinue scheduled hydroxyzine, Claritin, and Benadryl   Migraines Tylenol as needed available.   4. Discharge Planning:              -- Social work and case management to assist with discharge planning and identification of hospital follow-up needs prior to discharge              -- Estimated LOS: 2-3 more days              -- Discharge Concerns: Need to establish a safety plan; Medication compliance and effectiveness             -- Discharge Goals: Return home with outpatient referrals for mental health follow-up including medication management/psychotherapy    Cristy Hilts, MD 07/02/2023, 10:31 AM  Total Time Spent in Direct Patient Care:  I personally spent 25 minutes on the unit in direct patient care. The direct patient care time included  face-to-face time with the patient, reviewing the patient's chart, communicating with other professionals, and coordinating care. Greater than 50% of this time was spent in counseling or coordinating care with the patient regarding goals of hospitalization, psycho-education, and discharge planning needs.   Phineas Inches, MD Psychiatrist

## 2023-07-02 NOTE — [facility] Group Notes (Signed)
Adult Psychoeducational Group Note  Date:  07/02/2023 Time:  3:22 PM  Group Topic/Focus:  Goals Group:   The focus of this group is to help patients establish daily goals to achieve during treatment and discuss how the patient can incorporate goal setting into their daily lives to aide in recovery. Managing Feelings:   The focus of this group is to identify what feelings patients have difficulty handling and develop a plan to handle them in a healthier way upon discharge.  Participation Level:  Minimal  Participation Quality:  Drowsy  Affect:  Appropriate  Cognitive:  Appropriate  Insight: Improving  Engagement in Group:  Engaged  Modes of Intervention:  Discussion, Education, and Exploration  Additional Comments:  Pt participated in group. Pt stated his goal is to stay awake and participate in groups. Pt did not say much during group. Pt actively listened as group discussed self-worth, self-awareness. Pt's discussed the barriers faced when discharging along with the emotions, feelings and the uncertainty.       Angel [name] 07/02/2023, 3:22 PM

## 2023-07-02 NOTE — Group Note (Signed)
Recreation Therapy Group Note   Group Topic:Relaxation  Group Date: 07/02/2023 Start Time: 0945 End Time: 1005 Facilitators:Yarrow Linhart-McCall, LRT,CTRS Location: [address] Dayroom   Goal Area(s) Addresses:  Patient will identify positive stress management techniques. Patient will identify benefits of using stress management post d/c.  Group Description: Meditation. LRT discussed meditation with group. LRT then played a meditation that focused identifying those things that are holding you back and how to overcome them. Patients were to get as comfortable as possible and focus on the content of the meditation. LRT informed patients they could find other meditations and stress management techniques from apps, Youtube, scripts, etc.   Affect/Mood: Appropriate   Participation Level: Engaged   Participation Quality: Independent   Behavior: Appropriate   Speech/Thought Process: Focused   Insight: Good   Judgement: Good   Modes of Intervention: Meditation   Patient Response to Interventions:  Engaged   Education Outcome:  Acknowledges education   Clinical Observations/Individualized Feedback: Pt attended and participated in group session.    Plan: Continue to engage patient in RT group sessions 2-3x/week.   Joya Willmott-McCall, LRT,CTRS 07/02/2023 12:35 PM

## 2023-07-03 ENCOUNTER — Other Ambulatory Visit (HOSPITAL_COMMUNITY): Payer: Self-pay

## 2023-07-03 DIAGNOSIS — F332 Major depressive disorder, recurrent severe without psychotic features: Secondary | ICD-10-CM | POA: Diagnosis not present

## 2023-07-03 MED ORDER — NICOTINE 14 MG/24HR TD PT24: 14.0000 mg | MEDICATED_PATCH | Freq: Every day | TRANSDERMAL | 0 refills | Status: DC

## 2023-07-03 MED ORDER — VENLAFAXINE HCL ER 150 MG PO CP24
150.0000 mg | ORAL_CAPSULE | Freq: Every day | ORAL | 0 refills | Status: DC
  Filled 2023-07-03: qty 30, 30d supply, fill #0

## 2023-07-03 MED ORDER — HYDROXYZINE HCL 25 MG PO TABS
25.0000 mg | ORAL_TABLET | Freq: Three times a day (TID) | ORAL | 0 refills | Status: AC | PRN
  Filled 2023-07-03: qty 30, 10d supply, fill #0

## 2023-07-03 MED ORDER — TRAZODONE HCL 100 MG PO TABS
100.0000 mg | ORAL_TABLET | Freq: Every evening | ORAL | 0 refills | Status: DC | PRN
  Filled 2023-07-03: qty 30, 30d supply, fill #0

## 2023-07-03 MED ORDER — MIRTAZAPINE 15 MG PO TABS
15.0000 mg | ORAL_TABLET | Freq: Every day | ORAL | 0 refills | Status: DC
  Filled 2023-07-03: qty 30, 30d supply, fill #0

## 2023-07-03 NOTE — Progress Notes (Signed)
[facility] MD Progress Note  07/03/2023 2:59 PM Angel [name]  MRN:  [id number]  Principal Problem: MDD (major depressive disorder) Diagnosis: Principal Problem:   MDD (major depressive disorder) Active Problems:   Generalized anxiety disorder   MDD (major depressive disorder), recurrent severe, without psychosis (HCC)   Housing instability after recent homelessness   Scabies   Reason for Admission:  Angel [name]. Angel [name] "Angel [name]", is a 42 yo male with a PPHx of MDD, GAD, tobacco use disorder, chronic suicidality and self-harm, 1 suicide attempt by cutting wrist, and with a psychiatric hospitalization 2 weeks at [facility] prior to this admission for suicidal ideations with plan to hang himself.  Patient has been voluntarily admitted to [facility] on 06/20/2023 for worsening depression and continued suicidal ideation in the setting of medication noncompliance and multiple psychosocial stressors including homelessness.  Yesterday, the psychiatry team made following recommendations:  Continue Ativan 1 mg every 6 hours as needed for anxiety Continue Effexor XR 150 mg daily with breakfast for MDD, GAD Continue mirtazapine 15 mg once at night for MDD, insomnia Continue hydroxyzine 25 mg as needed 3 times daily for anxiety, pruritus Continue trazodone from 100 mg as needed once at night for insomnia  Information Obtained Today During Patient Interview:  Patient reports modest improvements in anxiety today, believes increasing effexor has helped. Continues to report passive SI that is unchanged. Contracts for safety on unit.  He continues to report elevated anxiety, admits he has not asked for PRN meds because "I don't like asking people for help". Patient was reassured and encouraged to ask for PRN anxiety medications, verbalized understanding. He reports sleep and appetite have been good. Patient's affect appears anxious and distressed.   Patient denies homicidal ideations. Denies hallucinations. Denies paranoia  and delusional thought processes.   Pertinent information discussed during bed progression: No acute concerns.   Past Psychiatric Hx: Previous Psych Diagnoses: MDD, GAD Prior inpatient treatment: Hospitalized 2 weeks ago, hospitalized in early June, hospitalized numerous times in the past Current/prior outpatient treatment: None reported Prior rehab hx: None reported Psychotherapy hx: No history History of suicide: 1 suicide attempt by wrist cutting. History of homicide or aggression: No reported history Psychiatric medication history: Fluoxetine which helped his anxiety, Vistaril which helps with anxiety, Remeron which helped his sleep, no reported medications that he reports poor outcomes from Psychiatric medication compliance history: Issues with nonadherence due to housing and financial instability Neuromodulation history: No neuromodulation history Current Psychiatrist: No current or past psychiatrist Current therapist: No current or past therapist   Substance Abuse Hx: Alcohol: No current or past use Tobacco: Occasional use of cigarettes Illicit drugs: No current or past use of illicit substances except occasional cannabis use. Rx drug abuse: No past or current use Rehab hx: No history of substance rehab   Past Medical History: Medical Diagnoses: Migraines treated with Tylenol extra-strength Home Rx: No home meds Prior Hosp: No reported medical hospitalizations Prior Surgeries/Trauma: Reports no surgeries Head trauma, LOC, concussions, seizures: No history of TBI or seizures.  Allergies: No known drug allergies PCP: No current PCP   Family History: Medical: Does not report any medical family history Psych: Does not report any mental health issues or substance use except for MDD and aunt Psych Rx: No reported medication responses SA/HA: No reported family history of SA or HA Substance use family hx: None reported   Social History: Childhood (bring, raised, lives now,  parents, siblings, schooling, education): Does not go into details on his childhood  but has a very poor relationship with his family currently.  Reports having a brother. Abuse: Does not report any physical, emotional, sexual abuse anytime in life. Marital Status: Single Sexual orientation: Did not report Children: No children Employment: Currently unemployed.  Did not go into detail about his past employment Peer Group: Reports having no social support or peer group. Housing: Also living a house with his previous friends when they abandoned him and he was forced to leave the house. Finances: Significant financial issues Legal: No current legal issues. Military: Did not ask the patient. Past Medical History:  Past Medical History:  Diagnosis Date   Anxiety    Depression    History of admission to inpatient psychiatry department 05/03/2023   Migraine    Suicidal ideation 05/03/2023   Family History:  Family History  Problem Relation Age of Onset   Cancer Mother        brain   Diabetes Brother    Hypertension Brother    Current Medications: Current Facility-Administered Medications  Medication Dose Route Frequency Provider Last Rate Last Admin   acetaminophen (TYLENOL) tablet 650 mg  650 mg Oral Q6H PRN Abbott Pao, Nadir, MD       alum & mag hydroxide-simeth (MAALOX/MYLANTA) 200-200-20 MG/5ML suspension 30 mL  30 mL Oral Q4H PRN Abbott Pao, Nadir, MD       haloperidol (HALDOL) tablet 5 mg  5 mg Oral Q4H PRN Abbott Pao, Nadir, MD       And   LORazepam (ATIVAN) tablet 2 mg  2 mg Oral Q4H PRN Abbott Pao, Nadir, MD       And   benztropine (COGENTIN) tablet 1 mg  1 mg Oral Q4H PRN Abbott Pao, Nadir, MD       haloperidol lactate (HALDOL) injection 5 mg  5 mg Intramuscular Q4H PRN Abbott Pao, Nadir, MD       And   LORazepam (ATIVAN) injection 2 mg  2 mg Intramuscular Q4H PRN Abbott Pao, Nadir, MD       And   benztropine mesylate (COGENTIN) injection 1 mg  1 mg Intramuscular Q4H PRN Abbott Pao, Nadir, MD        hydrocortisone cream 1 %   Topical BID Margaretmary Dys, MD   1 Application at 07/03/23 0834   hydrOXYzine (ATARAX) tablet 25 mg  25 mg Oral TID PRN Sarita Bottom, MD   25 mg at 07/03/23 1047   LORazepam (ATIVAN) tablet 1 mg  1 mg Oral Q6H PRN Carrion-Carrero, Aarion Metzgar, MD   1 mg at 06/29/23 1038   magnesium hydroxide (MILK OF MAGNESIA) suspension 30 mL  30 mL Oral Daily PRN Abbott Pao, Nadir, MD       mirtazapine (REMERON) tablet 15 mg  15 mg Oral QHS Attiah, Nadir, MD   15 mg at 07/02/23 2138   nicotine (NICODERM CQ - dosed in mg/24 hours) patch 14 mg  14 mg Transdermal Daily Attiah, Nadir, MD   14 mg at 07/03/23 0836   traZODone (DESYREL) tablet 100 mg  100 mg Oral QHS PRN Abbott Pao, Nadir, MD   100 mg at 07/02/23 2138   venlafaxine XR (EFFEXOR-XR) 24 hr capsule 150 mg  150 mg Oral Q breakfast Massengill, Harrold Donath, MD   150 mg at 07/03/23 2841    Lab Results:  No results found for this or any previous visit (from the past 48 hour(s)).   Blood Alcohol level:  Lab Results  Component Value Date   ETH <10 06/20/2023   ETH <10 06/03/2023  Metabolic Labs: Lab Results  Component Value Date   HGBA1C 5.3 04/30/2023   MPG 105 04/30/2023   MPG 105 08/28/2018   Lab Results  Component Value Date   PROLACTIN 6.7 08/28/2018   PROLACTIN 17.3 (H) 05/17/2017   Lab Results  Component Value Date   CHOL 168 04/30/2023   TRIG 108 04/30/2023   HDL 55 04/30/2023   CHOLHDL 3.1 04/30/2023   VLDL 22 04/30/2023   LDLCALC 91 04/30/2023   LDLCALC 104 (H) 08/28/2018    Sleep:Sleep: Good   Physical Findings: AIMS: No  CIWA:    COWS:     Psychiatric Specialty Exam:   Presentation  General Appearance: Appropriate for Environment; Casual  Eye Contact:Good  Speech:Clear and Coherent; Normal Rate  Speech Volume:Normal  Handedness:-- (Not assessed)   Mood and Affect  Mood:-- ("A little better")  Affect:Depressed (appears anxious and flustered)   Thought Process  Thought  Processes:Linear; Coherent; Goal Directed  Descriptions of Associations:Intact  Orientation:Full (Time, Place and Person)  Thought Content:Logical  History of Schizophrenia/Schizoaffective disorder:No  Duration of Psychotic Symptoms: N/A Hallucinations:Hallucinations: None  Ideas of Reference:None  Suicidal Thoughts:Suicidal Thoughts: Yes, Passive SI Active Intent and/or Plan: Without Intent; Without Plan SI Passive Intent and/or Plan: Without Intent; Without Plan  Homicidal Thoughts:Homicidal Thoughts: No   Sensorium  Memory:Immediate Fair; Recent Fair; Remote Fair  Judgment:Fair  Insight:Fair   Executive Functions  Concentration:Fair  Attention Span:Fair  Recall:Fair  Fund of Knowledge:Fair  Language:Fair   Psychomotor Activity  Psychomotor Activity:Psychomotor Activity: Normal   Assets  Assets:Communication Skills; Desire for Improvement   Sleep  Sleep:Sleep: Good    Physical Exam: Physical Exam Vitals and nursing note reviewed.  Constitutional:      General: He is not in acute distress.    Appearance: He is not ill-appearing.  HENT:     Head: Normocephalic and atraumatic.  Pulmonary:     Effort: Pulmonary effort is normal. No respiratory distress.  Musculoskeletal:        General: Normal range of motion.  Neurological:     Mental Status: He is alert.  Psychiatric:        Behavior: Behavior normal.    Review of Systems  Constitutional: Negative.   Cardiovascular: Negative.   Genitourinary: Negative.   Psychiatric/Behavioral:  Positive for depression and suicidal ideas. Negative for hallucinations, memory loss and substance abuse. The patient is nervous/anxious. The patient does not have insomnia.    Blood pressure 102/79, pulse 98, temperature 98.1 F (36.7 C), temperature source Oral, resp. rate 18, height 5\' 8"  (1.727 m), weight 71.2 kg, SpO2 100%. Body mass index is 23.87 kg/m.  Treatment Plan Summary: Daily contact with  patient to assess and evaluate symptoms and progress in treatment and Medication management   ASSESSMENT: Jylon Bronikowski. Angel [name] "Angel [name]", is a 42 yo male with a PPHx of MDD, GAD, tobacco use disorder, chronic suicidality and self-harm, 1 suicide attempt by cutting wrist, and with a psychiatric hospitalization 2 weeks at [facility] prior to this admission for suicidal ideations with plan to hang himself.  Patient has been voluntarily admitted to [facility] on 06/20/2023 for worsening depression and continued suicidal ideation in the setting of medication noncompliance and multiple psychosocial stressors including homelessness.  7/26: Sent to ED for R flank pain.    7/27+28: ED ruled out life-threatening causes acute abdomen and reported right flank pain likely related to MSK.  On 7/28 titrated up on mirtazapine and trazodone.  Received ivermectin.   7/29: ID  recommends continued contact precaution during hospitalization.    7/30: Stopped fluoxetine, started venlafaxine.   8/2: Increased venlafaxine to 112.5 mg daily. Ordered TSH.   8/3: (correction to previous progress note) No notable improvements in depression on this day.   8/4: Increasing Effexor today (150 mg starting today), subtle improvement in mood reactivity.   8/6: No significant improvements in depression or anxiety.  Diagnoses / Active Problems: MDD, recurrent severe, without psychosis GAD  PLAN: Safety and Monitoring:  --  VOLUNTARY admission to inpatient psychiatric unit for safety, stabilization and treatment  -- Daily contact with patient to assess and evaluate symptoms and progress in treatment  -- Patient's case to be discussed in multi-disciplinary team meeting  -- Observation Level : q15 minute checks  -- Vital signs:  q12 hours  -- Precautions: suicide, elopement, and assault  2. Psychiatric Diagnoses and Treatment:  Continue Ativan 1 mg every 6 hours as needed for anxiety Continue Effexor XR 150 mg daily with breakfast for  MDD, GAD Continue mirtazapine 15 mg once at night for MDD, insomnia Continue hydroxyzine 25 mg as needed 3 times daily for anxiety, pruritus Continue trazodone from 100 mg as needed once at night for insomnia BMI: 24.02 kg/m TSH: WNL on 04/30/2023 Lipid Panel: WNL on 04/30/2023 HbgA1c: 5.3% on 05/01/2023 QTc: 444 on 06/20/2023             -- Encouraged patient to participate in unit milieu and in scheduled group therapies   -- Short Term Goals: Ability to identify and develop effective coping behaviors will improve  -- Long Term Goals: Improvement in symptoms so as ready for discharge  3. Medical Issues Being Addressed:   #Tobacco Use Disorder Nicotine patch 21mg /24 hours ordered Smoking cessation encouraged  #Scabies infestation -- RESOLVED  Permethrin topical Discontinued scheduled hydroxyzine, Claritin, and Benadryl after symptoms improved  #Migraines Tylenol as needed available.  4. Discharge Planning:   -- Social work and case management to assist with discharge planning and identification of hospital follow-up needs prior to discharge  -- Estimated LOS: Pending improvement in psychiatric symptoms  -- Discharge Concerns: Need to establish a safety plan; Medication compliance and effectiveness  -- Discharge Goals: Return home with outpatient referrals for mental health follow-up including medication management/psychotherapy   I certify that inpatient services furnished can reasonably be expected to improve the patient's condition.   This note was created using a voice recognition software as a result there may be grammatical errors inadvertently enclosed that do not reflect the nature of this encounter. Every attempt is made to correct such errors.   Dr. Liston Alba, MD PGY-2, Psychiatry Residency  8/6/20242:59 PM

## 2023-07-03 NOTE — Progress Notes (Signed)
Pt observed to be guarded, isolative in hall at intervals during shift. Presents with flat affect, depressed mood, logical speech and is ambulatory in milieu with a steady gait. Denies HI, AVH and pain when assessed. Continues to edorse +SI without plan when assessed. Reports he slept well last night with good appetite. Denies adverse drug reactions when assessed. Rates his anxiety 7 and depression 8/10 with current stressor being "Life, I don't know where I'm going when I leave here. I don't have no job, no money and no one to help me right now". PRN Vistaril 25 mg PO given at 1047 with desired effect when reassessed at 1145.  Pt tolerated meals, fluids and medications well without discomfort. Pt did not attend scheduled groups despite multiple prompts. Support, encouragement and reassurance offered. Safety checks maintained at Q 15 minutes intervals without incident.

## 2023-07-03 NOTE — BHH Group Notes (Signed)
Spiritual care group on grief and loss facilitated by chaplain Burnis Kingfisher MDiv, BCC  Group Goal:  Support / Education around grief and loss Members engage in facilitated group support and psycho-social education.  Group Description:  Following introductions and group rules, group members engaged in facilitated group dialog and support around topic of loss, with particular support around experiences of loss in their lives. Group Identified types of loss (relationships / self / things) and identified patterns, circumstances, and changes that precipitate losses. Reflected on thoughts / feelings around loss, normalized grief responses, and recognized variety in grief experience.   Group noted Worden's four tasks of grief in discussion.  Group drew on Adlerian / Rogerian, narrative, MI, Patient Progress:  Angel [name] was present throughout group.  Did not engage verbally in group discussion.

## 2023-07-03 NOTE — Group Note (Signed)
Recreation Therapy Group Note   Group Topic:Animal Assisted Therapy   Group Date: 07/03/2023 Start Time: 0950 End Time: 1030 Facilitators: Kimyata Milich-McCall, LRT,CTRS Location: [address] Hall Dayroom   Animal-Assisted Activity (AAA) Program Checklist/Progress Notes Patient Eligibility Criteria Checklist & Daily Group note for Rec Tx Intervention  AAA/T Program Assumption of Risk Form signed by Patient/ or Parent Legal Guardian Yes  Patient understands his/her participation is voluntary Yes   Affect/Mood: N/A   Participation Level: Did not attend    Clinical Observations/Individualized Feedback:     Plan: Continue to engage patient in RT group sessions 2-3x/week.   Loistine Eberlin-McCall, LRT,CTRS 07/03/2023 1:08 PM

## 2023-07-03 NOTE — BHH Group Notes (Signed)
Adult Psychoeducational Group Note  Date:  07/03/2023 Time:  10:21 PM  Group Topic/Focus:  Wrap-Up Group:   The focus of this group is to help patients review their daily goal of treatment and discuss progress on daily workbooks.  Participation Level:  Active  Participation Quality:  Appropriate  Affect:  Appropriate  Cognitive:  Alert  Insight: Improving  Engagement in Group:  Engaged  Modes of Intervention:  Discussion and Education  Additional Comments:  Pt attended and participated in group.  Maura Crandall Cassandra 07/03/2023, 10:21 PM

## 2023-07-03 NOTE — Progress Notes (Signed)
   07/02/23 2135  Psych Admission Type (Psych Patients Only)  Admission Status Voluntary  Psychosocial Assessment  Patient Complaints Depression  Eye Contact Fair  Facial Expression Flat  Affect Appropriate to circumstance  Speech Logical/coherent  Interaction Minimal  Motor Activity Fidgety  Appearance/Hygiene Disheveled  Behavior Characteristics Cooperative;Appropriate to situation  Mood Depressed  Thought Process  Coherency WDL  Content WDL  Delusions None reported or observed  Perception WDL  Hallucination None reported or observed  Judgment Impaired  Confusion None  Danger to Self  Current suicidal ideation? Denies  Self-Injurious Behavior No self-injurious ideation or behavior indicators observed or expressed   Agreement Not to Harm Self Yes  Description of Agreement verbal  Danger to Others  Danger to Others None reported or observed

## 2023-07-03 NOTE — Plan of Care (Signed)
  Problem: Safety: Goal: Periods of time without injury will increase Outcome: Progressing   

## 2023-07-03 NOTE — Progress Notes (Signed)
   07/03/23 0558  15 Minute Checks  Location Bedroom  Visual Appearance Calm  Behavior Sleeping  Sleep (Behavioral Health Patients Only)  Calculate sleep? (Click Yes once per 24 hr at 0600 safety check) Yes  Documented sleep last 24 hours 9.5

## 2023-07-03 NOTE — Discharge Instructions (Signed)
-  Follow-up with your outpatient psychiatric provider -instructions on appointment date, time, and address (location) are provided to you in discharge paperwork.  -Take your psychiatric medications as prescribed at discharge - instructions are provided to you in the discharge paperwork You are getting 30 days of medication in hand, at discharge.   -Follow-up with outpatient primary care doctor and other specialists -for management of preventative medicine and any chronic medical disease.  -Recommend abstinence from alcohol, tobacco, and other illicit drug use at discharge.   -If your psychiatric symptoms recur, worsen, or if you have side effects to your psychiatric medications, call your outpatient psychiatric provider, 911, [phone] or go to the nearest emergency department.  -If suicidal thoughts occur, call your outpatient psychiatric provider, 911, [phone] or go to the nearest emergency department.  Naloxone (Narcan) can help reverse an overdose when given to the victim quickly.  [facility] offers free naloxone kits and instructions/training on its use.  Add naloxone to your first aid kit and you can help save a life.   Pick up your free kit at the following locations:   [place]:  [facility], [address] [place] [address] [phone]) [facility] [address] [place] [address] [phone]) [facility] [address] [place] [address]  [place]: [facility] [address] [place] [address] ([phone]) [facility] [address] [place] [address] [phone])

## 2023-07-04 ENCOUNTER — Encounter (HOSPITAL_COMMUNITY): Payer: Self-pay

## 2023-07-04 DIAGNOSIS — F332 Major depressive disorder, recurrent severe without psychotic features: Secondary | ICD-10-CM | POA: Diagnosis not present

## 2023-07-04 NOTE — Group Note (Unsigned)
Date:  07/04/2023 Time:  9:09 AM  Group Topic/Focus:  Goals Group:   The focus of this group is to help patients establish daily goals to achieve during treatment and discuss how the patient can incorporate goal setting into their daily lives to aide in recovery.     Participation Level:  {BHH PARTICIPATION UJWJX:91478}  Participation Quality:  {BHH PARTICIPATION QUALITY:22265}  Affect:  {BHH AFFECT:22266}  Cognitive:  {BHH COGNITIVE:22267}  Insight: {BHH Insight2:20797}  Engagement in Group:  {BHH ENGAGEMENT IN GNFAO:13086}  Modes of Intervention:  {BHH MODES OF INTERVENTION:22269}  Additional Comments:  ***  Angel [name] 07/04/2023, 9:09 AM

## 2023-07-04 NOTE — Progress Notes (Signed)
   07/04/23 0800  Psych Admission Type (Psych Patients Only)  Admission Status Voluntary  Psychosocial Assessment  Patient Complaints Depression;Anxiety  Eye Contact Fair  Affect Appropriate to circumstance  Speech Logical/coherent  Interaction Assertive  Motor Activity Other (Comment) (WNL)  Appearance/Hygiene Disheveled  Behavior Characteristics Cooperative;Appropriate to situation  Mood Pleasant  Thought Process  Coherency WDL  Content WDL  Delusions None reported or observed  Perception WDL  Hallucination None reported or observed  Judgment Limited  Confusion None  Danger to Self  Current suicidal ideation? Denies  Self-Injurious Behavior No self-injurious ideation or behavior indicators observed or expressed   Agreement Not to Harm Self Yes  Description of Agreement Verbal  Danger to Others  Danger to Others None reported or observed

## 2023-07-04 NOTE — Plan of Care (Signed)
  Problem: Education: Goal: Knowledge of [facility] Education information/materials will improve Outcome: Progressing Goal: Emotional status will improve Outcome: Progressing Goal: Mental status will improve Outcome: Progressing Goal: Verbalization of understanding the information provided will improve Outcome: Progressing   Problem: Activity: Goal: Interest or engagement in activities will improve Outcome: Progressing Goal: Sleeping patterns will improve Outcome: Progressing   Problem: Coping: Goal: Ability to verbalize frustrations and anger appropriately will improve Outcome: Progressing Goal: Ability to demonstrate self-control will improve Outcome: Progressing   Problem: Health Behavior/Discharge Planning: Goal: Identification of resources available to assist in meeting health care needs will improve Outcome: Progressing Goal: Compliance with treatment plan for underlying cause of condition will improve Outcome: Progressing   Problem: Physical Regulation: Goal: Ability to maintain clinical measurements within normal limits will improve Outcome: Progressing   Problem: Safety: Goal: Periods of time without injury will increase Outcome: Progressing   Problem: Education: Goal: Ability to make informed decisions regarding treatment will improve Outcome: Progressing   Problem: Coping: Goal: Coping ability will improve Outcome: Progressing   Problem: Health Behavior/Discharge Planning: Goal: Identification of resources available to assist in meeting health care needs will improve Outcome: Progressing   Problem: Medication: Goal: Compliance with prescribed medication regimen will improve Outcome: Progressing   Problem: Self-Concept: Goal: Ability to disclose and discuss suicidal ideas will improve Outcome: Progressing Goal: Will verbalize positive feelings about self Outcome: Progressing   

## 2023-07-04 NOTE — Transportation (Signed)
07/04/2023  Angel [name] DOB: 1981/08/04 MRN: [id number]   RIDER WAIVER AND RELEASE OF LIABILITY  For the purposes of helping with transportation needs, [facility] partners with outside transportation providers (taxi companies, [place], Catering manager.) to give [employer] patients or other approved people the choice of on-demand rides [facility]") to our buildings for non-emergency visits.  By using [employer], I, the person signing this document, on behalf of myself and/or any legal minors (in my care using the [employer]), agree:  Science writer given to me are supplied by independent, outside transportation providers who do not work for, or have any affiliation with, [employer]. [place] is not a transportation company. [facility] has no control over the quality or safety of the rides I get using [employer]. [place] has no control over whether any outside ride will happen on time or not. [place] gives no guarantee on the reliability, quality, safety, or availability on any rides, or that no mistakes will happen. I know and accept that traveling by vehicle (car, truck, SVU, Zenaida Niece, bus, taxi, etc.) has risks of serious injuries such as disability, being paralyzed, and death. I know and agree the risk of using [employer] is mine alone, and not [employer]. Transport Services are provided "as is" and as are available. The transportation providers are in charge for all inspections and care of the vehicles used to provide these rides. I agree not to take legal action against [place], its agents, employees, officers, directors, representatives, insurers, attorneys, assigns, successors, subsidiaries, and affiliates at any time for any reasons related directly or indirectly to using [employer]. I also agree not to take legal action against [place] or its affiliates for any injury, death, or damage to property caused by or related to using  [employer]. I have read this Waiver and Release of Liability, and I understand the terms used in it and their legal meaning. This Waiver is freely and voluntarily given with the understanding that my right (or any legal minors) to legal action against [place] relating to [employer] is knowingly given up to use these services.   I attest that I read the Ride Waiver and Release of Liability to Angel [name], gave Mr. Breidinger the opportunity to ask questions and answered the questions asked (if any). I affirm that Angel [name] then provided consent for assistance with transportation.

## 2023-07-04 NOTE — Group Note (Signed)
Recreation Therapy Group Note   Group Topic:Team Building  Group Date: 07/04/2023 Start Time: 0930 End Time: 1000 Facilitators: Lorayne Getchell-McCall, LRT,CTRS Location: 300 [address] Dayroom   Goal Area(s) Addresses:  Patient will effectively work with peer towards shared goal.  Patient will identify skills used to make activity successful.  Patient will identify how skills used during activity can be applied to reach post d/c goals.   Group Description: [employer]. In teams of 5-6, patients were given 11 craft pipe cleaners. Using the materials provided, patients were instructed to compete again the opposing team(s) to build the tallest free-standing structure from floor level. The activity was timed; difficulty increased by Clinical research associate as Production designer, theatre/television/film continued.  Systematically resources were removed with additional directions for example, placing one arm behind their back, working in silence, and shape stipulations. LRT facilitated post-activity discussion reviewing team processes and necessary communication skills involved in completion. Patients were encouraged to reflect how the skills utilized, or not utilized, in this activity can be incorporated to positively impact support systems post discharge.    Clinical Observations/Individualized Feedback: Due to limited staffing, group did not occur because LRT was helping on 500 hall by opening the dayroom for the patients.    Plan: Continue to engage patient in RT group sessions 2-3x/week.   Birt Reinoso-McCall, LRT,CTRS 07/04/2023 1:24 PM

## 2023-07-04 NOTE — Progress Notes (Signed)
Patient discharged  to home transported by cab. Discharge instructions, prescription, filled medication and information about about follow up appointment given to patient with verbalization of understanding. All personal belongings returned to patient at time of discharge.  Suicide safety assessment placed in pt's chart. Patient denies SI/HI and AVH at time of discharge. Skin intact, no distress noted or complaints made.

## 2023-07-04 NOTE — BH IP Treatment Plan (Signed)
Interdisciplinary Treatment and Diagnostic Plan Update  07/04/2023 Time of Session: 9:35am (UPDATE) Angel [name] MRN: [id number]  Principal Diagnosis: MDD (major depressive disorder)  Secondary Diagnoses: Principal Problem:   MDD (major depressive disorder) Active Problems:   Generalized anxiety disorder   MDD (major depressive disorder), recurrent severe, without psychosis (HCC)   Housing instability after recent homelessness   Scabies   Current Medications:  Current Facility-Administered Medications  Medication Dose Route Frequency Provider Last Rate Last Admin   acetaminophen (TYLENOL) tablet 650 mg  650 mg Oral Q6H PRN Abbott Pao, Nadir, MD       alum & mag hydroxide-simeth (MAALOX/MYLANTA) 200-200-20 MG/5ML suspension 30 mL  30 mL Oral Q4H PRN Abbott Pao, Nadir, MD       haloperidol (HALDOL) tablet 5 mg  5 mg Oral Q4H PRN Abbott Pao, Nadir, MD       And   LORazepam (ATIVAN) tablet 2 mg  2 mg Oral Q4H PRN Abbott Pao, Nadir, MD       And   benztropine (COGENTIN) tablet 1 mg  1 mg Oral Q4H PRN Abbott Pao, Nadir, MD       haloperidol lactate (HALDOL) injection 5 mg  5 mg Intramuscular Q4H PRN Abbott Pao, Nadir, MD       And   LORazepam (ATIVAN) injection 2 mg  2 mg Intramuscular Q4H PRN Abbott Pao, Nadir, MD       And   benztropine mesylate (COGENTIN) injection 1 mg  1 mg Intramuscular Q4H PRN Abbott Pao, Nadir, MD       hydrocortisone cream 1 %   Topical BID Margaretmary Dys, MD   1 Application at 07/03/23 0834   hydrOXYzine (ATARAX) tablet 25 mg  25 mg Oral TID PRN Sarita Bottom, MD   25 mg at 07/03/23 1047   LORazepam (ATIVAN) tablet 1 mg  1 mg Oral Q6H PRN Carrion-Carrero, Margely, MD   1 mg at 06/29/23 1038   magnesium hydroxide (MILK OF MAGNESIA) suspension 30 mL  30 mL Oral Daily PRN Abbott Pao, Nadir, MD       mirtazapine (REMERON) tablet 15 mg  15 mg Oral QHS Attiah, Nadir, MD   15 mg at 07/03/23 2109   nicotine (NICODERM CQ - dosed in mg/24 hours) patch 14 mg  14 mg Transdermal Daily  Attiah, Nadir, MD   14 mg at 07/04/23 0812   traZODone (DESYREL) tablet 100 mg  100 mg Oral QHS PRN Abbott Pao, Nadir, MD   100 mg at 07/02/23 2138   venlafaxine XR (EFFEXOR-XR) 24 hr capsule 150 mg  150 mg Oral Q breakfast Massengill, Harrold Donath, MD   150 mg at 07/04/23 8413   PTA Medications: Medications Prior to Admission  Medication Sig Dispense Refill Last Dose   FLUoxetine (PROZAC) 20 MG capsule Take 2 capsules (40 mg total) by mouth daily. (Patient not taking: Reported on 06/22/2023) 60 capsule 3    OLANZapine (ZYPREXA) 10 MG tablet Take 1 tablet (10 mg total) by mouth at bedtime. (Patient not taking: Reported on 06/22/2023) 30 tablet 3    traZODone (DESYREL) 50 MG tablet Take 1 tablet (50 mg total) by mouth at bedtime. (Patient not taking: Reported on 06/22/2023) 30 tablet 3     Patient Stressors: Financial difficulties   Occupational concerns    Patient Strengths: Ability for insight  Active sense of humor   Treatment Modalities: Medication Management, Group therapy, Case management,  1 to 1 session with clinician, Psychoeducation, Recreational therapy.   Physician Treatment Plan for Primary Diagnosis: MDD (  major depressive disorder) Long Term Goal(s): Improvement in symptoms so as ready for discharge   Short Term Goals: Ability to identify and develop effective coping behaviors will improve  Medication Management: Evaluate patient's response, side effects, and tolerance of medication regimen.  Therapeutic Interventions: 1 to 1 sessions, Unit Group sessions and Medication administration.  Evaluation of Outcomes: Adequate for Discharge  Physician Treatment Plan for Secondary Diagnosis: Principal Problem:   MDD (major depressive disorder) Active Problems:   Generalized anxiety disorder   MDD (major depressive disorder), recurrent severe, without psychosis (HCC)   Housing instability after recent homelessness   Scabies  Long Term Goal(s): Improvement in symptoms so as ready for  discharge   Short Term Goals: Ability to identify and develop effective coping behaviors will improve     Medication Management: Evaluate patient's response, side effects, and tolerance of medication regimen.  Therapeutic Interventions: 1 to 1 sessions, Unit Group sessions and Medication administration.  Evaluation of Outcomes: Adequate for Discharge   RN Treatment Plan for Primary Diagnosis: MDD (major depressive disorder) Long Term Goal(s): Knowledge of disease and therapeutic regimen to maintain health will improve  Short Term Goals: Ability to remain free from injury will improve, Ability to verbalize frustration and anger appropriately will improve, Ability to participate in decision making will improve, Ability to verbalize feelings will improve, Ability to identify and develop effective coping behaviors will improve, and Compliance with prescribed medications will improve  Medication Management: RN will administer medications as ordered by provider, will assess and evaluate patient's response and provide education to patient for prescribed medication. RN will report any adverse and/or side effects to prescribing provider.  Therapeutic Interventions: 1 on 1 counseling sessions, Psychoeducation, Medication administration, Evaluate responses to treatment, Monitor vital signs and CBGs as ordered, Perform/monitor CIWA, COWS, AIMS and Fall Risk screenings as ordered, Perform wound care treatments as ordered.  Evaluation of Outcomes: Adequate for Discharge   LCSW Treatment Plan for Primary Diagnosis: MDD (major depressive disorder) Long Term Goal(s): Safe transition to appropriate next level of care at discharge, Engage patient in therapeutic group addressing interpersonal concerns.  Short Term Goals: Engage patient in aftercare planning with referrals and resources, Increase social support, Increase emotional regulation, Facilitate acceptance of mental health diagnosis and concerns,  Identify triggers associated with mental health/substance abuse issues, and Increase skills for wellness and recovery  Therapeutic Interventions: Assess for all discharge needs, 1 to 1 time with Social worker, Explore available resources and support systems, Assess for adequacy in community support network, Educate family and significant other(s) on suicide prevention, Complete Psychosocial Assessment, Interpersonal group therapy.  Evaluation of Outcomes: Adequate for Discharge   Progress in Treatment: Attending groups: Yes. Participating in groups: Yes. Taking medication as prescribed: Yes. Toleration medication: Yes. Family/Significant other contact made:Patient declined  Patient understands diagnosis: Yes. Discussing patient identified problems/goals with staff: Yes. Medical problems stabilized or resolved: Yes. Denies suicidal/homicidal ideation: Yes. Issues/concerns per patient self-inventory: No.     New problem(s) identified: No, Describe:  None reported    New Short Term/Long Term Goal(s):   medication stabilization, elimination of SI thoughts, development of comprehensive mental wellness plan.     Patient Goals:  " medication management and housing "   Discharge Plan or Barriers: Patient recently admitted. CSW will continue to follow and assess for appropriate referrals and possible discharge planning.     Reason for Continuation of Hospitalization: Anxiety Depression Medication stabilization Suicidal ideation   Estimated Length of Stay: Adequate for discharge.  Last 3 [place] Suicide Severity Risk Score: Flowsheet Row ED from 06/22/2023 in [facility] Department at [facility] Most recent reading at 06/22/2023 10:42 AM [facility]Current[facility] Most recent reading at 06/20/2023  5:35 PM ED from 06/20/2023 in [facility] Emergency Department at [facility] Most recent reading at  06/20/2023  7:45 AM  C-SSRS RISK CATEGORY High Risk High Risk Moderate Risk       Last PHQ 2/9 Scores:     No data to display          Scribe for Treatment Team: Izell Vivian, LCSW 07/04/2023 1:24 PM

## 2023-07-04 NOTE — Progress Notes (Signed)
  [facility] Adult Case Management Discharge Plan :  Will you be returning to the same living situation after discharge:  Yes,  Patient will be returning back to [place] to his tent , patient request  At discharge, do you have transportation home?: Yes,  CSW will be arranging a Taxi for patient through [facility] around 1:00 PM  Do you have the ability to pay for your medications: Yes,  Patient [place] Medicaid Prepaid Health Plan /  Medicaid [employer]   Release of information consent forms completed and in the chart;  Patient's signature needed at discharge.  Patient to Follow up at:  Follow-up Information     Services, [facility]. Go on 07/05/2023.   Why: You have a hospital follow up appointment for therapy and/or medication management services on 07/05/23 at 11:00 am. This appointment will be held in person.  * If you do not wish to attend the appt, please make sure to call and cancel. Contact information: [address] [place] [address] [phone]                 Next level of care provider has access to [facility] Link:no  Safety Planning and Suicide Prevention discussed: No.patient declined safety planning stated that he did not have anyone.      Has patient been referred to the Quitline?: Patient refused referral for treatment  Patient has been referred for addiction treatment: No known substance use disorder.  Isabella Bowens, LCSWA 07/04/2023, 10:09 AM

## 2023-07-04 NOTE — Plan of Care (Signed)
Problem: Education: Goal: Knowledge of [place] [facility] Education information/materials will improve 07/04/2023 0958 by Rosita Fire, RN Outcome: Adequate for Discharge 07/04/2023 [phone] by Rosita Fire, RN Outcome: Adequate for Discharge 07/04/2023 [phone] by Rosita Fire, RN Outcome: Progressing Goal: Emotional status will improve 07/04/2023 0958 by Rosita Fire, RN Outcome: Adequate for Discharge 07/04/2023 [phone] by Rosita Fire, RN Outcome: Adequate for Discharge 07/04/2023 [phone] by Rosita Fire, RN Outcome: Progressing Goal: Mental status will improve 07/04/2023 0958 by Rosita Fire, RN Outcome: Adequate for Discharge 07/04/2023 [phone] by Rosita Fire, RN Outcome: Adequate for Discharge 07/04/2023 [phone] by Rosita Fire, RN Outcome: Progressing Goal: Verbalization of understanding the information provided will improve 07/04/2023 0958 by Rosita Fire, RN Outcome: Adequate for Discharge 07/04/2023 [phone] by Rosita Fire, RN Outcome: Adequate for Discharge 07/04/2023 [phone] by Rosita Fire, RN Outcome: Progressing   Problem: Activity: Goal: Interest or engagement in activities will improve 07/04/2023 0958 by Rosita Fire, RN Outcome: Adequate for Discharge 07/04/2023 [phone] by Rosita Fire, RN Outcome: Adequate for Discharge 07/04/2023 [phone] by Rosita Fire, RN Outcome: Progressing Goal: Sleeping patterns will improve 07/04/2023 0958 by Rosita Fire, RN Outcome: Adequate for Discharge 07/04/2023 [phone] by Rosita Fire, RN Outcome: Adequate for Discharge 07/04/2023 [phone] by Rosita Fire, RN Outcome: Progressing   Problem: Coping: Goal: Ability to verbalize frustrations and anger appropriately will improve 07/04/2023 0958 by Rosita Fire, RN Outcome: Adequate for Discharge 07/04/2023 [phone] by Rosita Fire, RN Outcome: Adequate for Discharge 07/04/2023 [phone] by Rosita Fire, RN Outcome: Progressing Goal: Ability to demonstrate self-control will  improve 07/04/2023 0958 by Rosita Fire, RN Outcome: Adequate for Discharge 07/04/2023 [phone] by Rosita Fire, RN Outcome: Adequate for Discharge 07/04/2023 [phone] by Rosita Fire, RN Outcome: Progressing   Problem: Health Behavior/Discharge Planning: Goal: Identification of resources available to assist in meeting health care needs will improve 07/04/2023 0958 by Rosita Fire, RN Outcome: Adequate for Discharge 07/04/2023 [phone] by Rosita Fire, RN Outcome: Adequate for Discharge 07/04/2023 [phone] by Rosita Fire, RN Outcome: Progressing Goal: Compliance with treatment plan for underlying cause of condition will improve 07/04/2023 0958 by Rosita Fire, RN Outcome: Adequate for Discharge 07/04/2023 [phone] by Rosita Fire, RN Outcome: Adequate for Discharge 07/04/2023 [phone] by Rosita Fire, RN Outcome: Progressing   Problem: Physical Regulation: Goal: Ability to maintain clinical measurements within normal limits will improve 07/04/2023 0958 by Rosita Fire, RN Outcome: Adequate for Discharge 07/04/2023 [phone] by Rosita Fire, RN Outcome: Adequate for Discharge 07/04/2023 [phone] by Rosita Fire, RN Outcome: Progressing   Problem: Safety: Goal: Periods of time without injury will increase 07/04/2023 0958 by Rosita Fire, RN Outcome: Adequate for Discharge 07/04/2023 [phone] by Rosita Fire, RN Outcome: Adequate for Discharge 07/04/2023 [phone] by Rosita Fire, RN Outcome: Progressing   Problem: Education: Goal: Ability to make informed decisions regarding treatment will improve 07/04/2023 0958 by Rosita Fire, RN Outcome: Adequate for Discharge 07/04/2023 [phone] by Rosita Fire, RN Outcome: Adequate for Discharge 07/04/2023 [phone] by Rosita Fire, RN Outcome: Progressing   Problem: Coping: Goal: Coping ability will improve 07/04/2023 0958 by Rosita Fire, RN Outcome: Adequate for Discharge 07/04/2023 [phone] by Rosita Fire, RN Outcome: Adequate for Discharge 07/04/2023  [phone] by Rosita Fire, RN Outcome: Progressing   Problem: Health Behavior/Discharge Planning: Goal: Identification of resources available to  assist in meeting health care needs will improve 07/04/2023 0958 by Rosita Fire, RN Outcome: Adequate for Discharge 07/04/2023 [phone] by Rosita Fire, RN Outcome: Adequate for Discharge 07/04/2023 [phone] by Rosita Fire, RN Outcome: Progressing   Problem: Medication: Goal: Compliance with prescribed medication regimen will improve 07/04/2023 0958 by Rosita Fire, RN Outcome: Adequate for Discharge 07/04/2023 [phone] by Rosita Fire, RN Outcome: Adequate for Discharge 07/04/2023 [phone] by Rosita Fire, RN Outcome: Progressing   Problem: Self-Concept: Goal: Ability to disclose and discuss suicidal ideas will improve 07/04/2023 0958 by Rosita Fire, RN Outcome: Adequate for Discharge 07/04/2023 [phone] by Rosita Fire, RN Outcome: Adequate for Discharge 07/04/2023 [phone] by Rosita Fire, RN Outcome: Progressing Goal: Will verbalize positive feelings about self 07/04/2023 0958 by Rosita Fire, RN Outcome: Adequate for Discharge 07/04/2023 [phone] by Rosita Fire, RN Outcome: Adequate for Discharge 07/04/2023 [phone] by Rosita Fire, RN Outcome: Progressing

## 2023-07-04 NOTE — Progress Notes (Signed)
   07/03/23 2000  Psych Admission Type (Psych Patients Only)  Admission Status Voluntary  Psychosocial Assessment  Patient Complaints Isolation;Depression  Eye Contact Fair  Facial Expression Flat  Affect Appropriate to circumstance  Speech Logical/coherent  Interaction Assertive  Motor Activity Slow  Appearance/Hygiene Disheveled  Behavior Characteristics Appropriate to situation;Cooperative  Mood Pleasant  Thought Process  Coherency WDL  Content WDL  Delusions None reported or observed  Perception WDL  Hallucination None reported or observed  Judgment Poor  Confusion None  Danger to Self  Current suicidal ideation? Denies  Self-Injurious Behavior No self-injurious ideation or behavior indicators observed or expressed   Agreement Not to Harm Self Yes  Description of Agreement verbal  Danger to Others  Danger to Others None reported or observed

## 2023-07-04 NOTE — Discharge Summary (Signed)
Physician Discharge Summary Note  Patient:  Angel [name] is an 42 y.o., male MRN:  [id number] DOB:  [birth year] Patient phone:  [phone] (home)  Patient address:   [address] [place] [address],  Total Time spent with patient: 15 minutes  Date of Admission:  06/20/2023 Date of Discharge: 07/04/23   Reason for Admission:    Angel [name]. Angel [name] "Angel [name]", is a 42 yo male with a PPHx of MDD, GAD, tobacco use disorder, chronic suicidality and self-harm, 1 suicide attempt by cutting wrist, and with a psychiatric hospitalization 2 weeks at [facility] prior to this admission for suicidal ideations with plan to hang himself. Patient has been voluntarily admitted to [facility] on 06/20/2023 for worsening depression and continued suicidal ideation in the setting of medication noncompliance and multiple psychosocial stressors including homelessness.   HPI on admission:  Patient reports that he has been living with some people about 2 months ago and and they had all of the sudden abandoned him and he had to move out.  For the first time in his life at age 38 he was experiencing homelessness and was living in a tent adjacent to this house [place] the [place].  He reported that during this time that his "his anxiety has been so high" and that he "wishes he was dead."  He reports that he has reached out to others for support but that nobody would help him including his family and friends.  During the interview he says multiple times that he "wants to die."  He reported that he had been hospitalized a couple weeks for the same concerns.  Over the past 2 weeks he has been experiencing all symptoms of depression including active suicidal thoughts.  In addition he experiences severe anxiety including claustrophobia (for example he was in a tent and started panicking from the enclosed nature and was brought the 10 target) and panic attacks.     Regarding negative psych review of systems, he denies any periods in his  life where he had decreased need for sleep, increased activity, and increased/expansive/irritable mood.  He denies any obsessions or compulsions.  He denies hearing voices telling him to end his life or other auditory hallucinations.  He denies that people are conspiring against him, spying on him, or trying harm in any way.  He reports no substance use aside from occasional cigarette use.  He denies any experience in life where he was physically, emotionally, or sexually abused.  Regarding his sleep he has issues falling asleep and staying asleep.  He also reports that he typically does not have much appetite.   Furthermore the patient is experiencing severe pruritus.  See exam for detailed description of the papules.  He reports that he is never experienced this in the past.  He reports seeing no mites, insects, spiders.    Patient reports that we can contact his brother, although the contact he gives is the wrong number.       Principal Problem: MDD (major depressive disorder) Discharge Diagnoses: Principal Problem:   MDD (major depressive disorder) Active Problems:   Generalized anxiety disorder   MDD (major depressive disorder), recurrent severe, without psychosis (HCC)   Housing instability after recent homelessness   Scabies   Past Psychiatric Hx: Previous Psych Diagnoses: MDD, GAD Prior inpatient treatment: Hospitalized 2 weeks ago, hospitalized in early June, hospitalized numerous times in the past Current/prior outpatient treatment: None reported Prior rehab hx: None reported Psychotherapy hx: No history History of suicide:  1 suicide attempt by wrist cutting. History of homicide or aggression: No reported history Psychiatric medication history: Fluoxetine which helped his anxiety, Vistaril which helps with anxiety, Remeron which helped his sleep, no reported medications that he reports poor outcomes from Psychiatric medication compliance history: Issues with nonadherence due to  housing and financial instability Neuromodulation history: No neuromodulation history Current Psychiatrist: No current or past psychiatrist Current therapist: No current or past therapist   Substance Abuse Hx: Alcohol: No current or past use Tobacco: Occasional use of cigarettes Illicit drugs: No current or past use of illicit substances except occasional cannabis use. Rx drug abuse: No past or current use Rehab hx: No history of substance rehab   Past Medical History: Medical Diagnoses: Migraines treated with Tylenol extra-strength Home Rx: No home meds Prior Hosp: No reported medical hospitalizations Prior Surgeries/Trauma: Reports no surgeries Head trauma, LOC, concussions, seizures: No history of TBI or seizures.  Allergies: No known drug allergies PCP: No current PCP   Family History: Medical: Does not report any medical family history Psych: Does not report any mental health issues or substance use except for MDD and aunt Psych Rx: No reported medication responses SA/HA: No reported family history of SA or HA Substance use family hx: None reported   Social History: Childhood (bring, raised, lives now, parents, siblings, schooling, education): Does not go into details on his childhood but has a very poor relationship with his family currently.  Reports having a brother. Abuse: Does not report any physical, emotional, sexual abuse anytime in life. Marital Status: Single Sexual orientation: Did not report Children: No children Employment: Currently unemployed.  Did not go into detail about his past employment Peer Group: Reports having no social support or peer group. Housing: Also living a house with his previous friends when they abandoned him and he was forced to leave the house. Finances: Significant financial issues Legal: No current legal issues. Military: Did not ask the patient.  Past Medical History:  Past Medical History:  Diagnosis Date   Anxiety     Depression    History of admission to inpatient psychiatry department 05/03/2023   Migraine    Suicidal ideation 05/03/2023    History reviewed. No pertinent surgical history. Family History:  Family History  Problem Relation Age of Onset   Cancer Mother        brain   Diabetes Brother    Hypertension Brother    Social History:  Social History   Substance and Sexual Activity  Alcohol Use No     Social History   Substance and Sexual Activity  Drug Use Not Currently   Comment: denies use in the last month    Social History   Socioeconomic History   Marital status: Single    Spouse name: Not on file   Number of children: Not on file   Years of education: Not on file   Highest education level: Not on file  Occupational History   Not on file  Tobacco Use   Smoking status: Every Day    Current packs/day: 1.50    Average packs/day: 1.5 packs/day for 20.0 years (30.0 ttl pk-yrs)    Types: Cigarettes   Smokeless tobacco: Never  Vaping Use   Vaping status: Never Used  Substance and Sexual Activity   Alcohol use: No   Drug use: Not Currently    Comment: denies use in the last month   Sexual activity: Not Currently    Birth control/protection: Condom  Other  Topics Concern   Not on file  Social History Narrative   Not on file   Social Determinants of Health   Financial Resource Strain: Not on file  Food Insecurity: Food Insecurity Present (06/20/2023)   Hunger Vital Sign    Worried About Running Out of Food in the Last Year: Often true    Ran Out of Food in the Last Year: Often true  Transportation Needs: Unmet Transportation Needs (06/20/2023)   PRAPARE - Administrator, Civil Service (Medical): Yes    Lack of Transportation (Non-Medical): Yes  Physical Activity: Not on file  Stress: Not on file  Social Connections: Not on file    Hospital Course:    During the patient's hospitalization, patient had extensive initial psychiatric evaluation, and  follow-up psychiatric evaluations every day.   Psychiatric diagnoses provided upon initial assessment:  MDD, recurrent, severe, without psychotic features  GAD   Patient's psychiatric medications were adjusted on admission:  Started fluoxetine 40 mg once daily for MDD, GAD Mirtazapine 7.5 mg once at night for MDD, insomnia Start hydroxyzine 25 mg as needed 3 times daily for anxiety Start trazodone 50 mg as needed once at night for insomnia   During the hospitalization, other adjustments were made to the patient's psychiatric medication regimen:  Fluoxetine was discontinued Patient was started on Effexor 75 mg initially, titrated up to 150 mg daily with breakfast for MDD, GAD Mirtazapine was increased from 7.5 mg to 15 mg nightly for MDD, insomnia Trazodone 50 mg was increased to 100 mg nightly as needed for insomnia   Patient's care was discussed during the interdisciplinary team meeting every day during the hospitalization.   The patient denies having side effects to prescribed psychiatric medication.   Gradually, patient started adjusting to milieu. The patient was evaluated each day by a clinical provider to ascertain response to treatment. Improvement was noted by the patient's report of decreasing symptoms, improved sleep and appetite, affect, medication tolerance, behavior, and participation in unit programming.  Patient was asked each day to complete a self inventory noting mood, mental status, pain, new symptoms, anxiety and concerns.     Symptoms were reported as significantly decreased or resolved completely by discharge.    On day of discharge, the patient reports that their mood is stable.  Patient reports passive suicidal ideation, which is chronic and unchanged for more than 48 hours prior to discharge.  He denies any intent or plan. Patient denies having homicidal thoughts.  Patient denies having auditory hallucinations.  Patient denies any visual hallucinations or other  symptoms of psychosis. The patient was motivated to continue taking medication with a goal of continued improvement in mental health.    The patient reports their target psychiatric symptoms of depression and anxiety all responded well to the psychiatric medications, and the patient reports overall benefit other psychiatric hospitalization. Supportive psychotherapy was provided to the patient. The patient also participated in regular group therapy while hospitalized. Coping skills, problem solving as well as relaxation therapies were also part of the unit programming.   Labs were reviewed with the patient, and abnormal results were discussed with the patient.   The patient is able to verbalize their individual safety plan to this provider.   # It is recommended to the patient to continue psychiatric medications as prescribed, after discharge from the hospital.     # It is recommended to the patient to follow up with your outpatient psychiatric provider and PCP.   #  It was discussed with the patient, the impact of alcohol, drugs, tobacco have been there overall psychiatric and medical wellbeing, and total abstinence from substance use was recommended the patient.ed.   # Prescriptions provided or sent directly to preferred pharmacy at discharge. Patient agreeable to plan. Given opportunity to ask questions. Appears to feel comfortable with discharge.    # In the event of worsening symptoms, the patient is instructed to call the crisis hotline, 911 and or go to the nearest ED for appropriate evaluation and treatment of symptoms. To follow-up with primary care provider for other medical issues, concerns and or health care needs   # Patient was discharged Home with a plan to follow up as noted below.   Physical Findings: AIMS:  , ,  ,  ,    CIWA:    COWS:     Musculoskeletal: Strength & Muscle Tone: within normal limits Gait & Station: normal Patient leans: N/A   Psychiatric Specialty Exam:     Presentation  General Appearance: Appropriate for Environment; Casual   Eye Contact:Good   Speech:Clear and Coherent; Normal Rate   Speech Volume:Normal   Handedness:-- (Not assessed)     Mood and Affect  Mood:-- ("Good")   Affect:Constricted, depressed appearing     Thought Process  Thought Processes:Linear; Coherent; Goal Directed   Descriptions of Associations:Intact   Orientation:Full (Time, Place and Person)   Thought Content:Logical   History of Schizophrenia/Schizoaffective disorder:No   Duration of Psychotic Symptoms: N/A Hallucinations:Hallucinations: None   Ideas of Reference:None   Suicidal Thoughts:Suicidal Thoughts: Yes, Passive (chronic, unchanged) SI Active Intent and/or Plan: Without Intent; Without Plan SI Passive Intent and/or Plan: Without Intent; Without Plan   Homicidal Thoughts:Homicidal Thoughts: No     Sensorium  Memory:Immediate Fair; Recent Fair; Remote Fair   Judgment:Fair   Insight:Fair     Executive Functions  Concentration:Fair   Attention Span:Fair   Recall:Fair   Fund of Knowledge:Fair   Language:Fair     Psychomotor Activity  Psychomotor Activity:Psychomotor Activity: Normal     Assets  Assets:Communication Skills; Desire for Improvement     Sleep  Sleep:Good, 8.24 hours   Physical Exam: Physical Exam Constitutional:      General: He is not in acute distress.    Appearance: He is normal weight. He is not ill-appearing.  HENT:     Head: Normocephalic and atraumatic.  Pulmonary:     Effort: Pulmonary effort is normal. No respiratory distress.  Musculoskeletal:        General: Normal range of motion.  Skin:    General: Skin is warm and dry.  Neurological:     General: No focal deficit present.     Mental Status: He is alert.      Review of Systems  Psychiatric/Behavioral:  Positive for suicidal ideas. Negative for depression, hallucinations, memory loss and substance abuse. The patient is not  nervous/anxious and does not have insomnia.   All other systems reviewed and are negative.   Blood pressure 107/83, pulse (!) 103, temperature 98.4 F (36.9 C), temperature source Oral, resp. rate 18, height 5\' 8"  (1.727 m), weight 71.2 kg, SpO2 98%. Body mass index is 23.87 kg/m.   Social History   Tobacco Use  Smoking Status Every Day   Current packs/day: 1.50   Average packs/day: 1.5 packs/day for 20.0 years (30.0 ttl pk-yrs)   Types: Cigarettes  Smokeless Tobacco Never   Tobacco Cessation:  A prescription for an FDA-approved tobacco cessation medication provided at  discharge   Blood Alcohol level:  Lab Results  Component Value Date   ETH <10 06/20/2023   ETH <10 06/03/2023    Metabolic Disorder Labs:  Lab Results  Component Value Date   HGBA1C 5.3 04/30/2023   MPG 105 04/30/2023   MPG 105 08/28/2018   Lab Results  Component Value Date   PROLACTIN 6.7 08/28/2018   PROLACTIN 17.3 (H) 05/17/2017   Lab Results  Component Value Date   CHOL 168 04/30/2023   TRIG 108 04/30/2023   HDL 55 04/30/2023   CHOLHDL 3.1 04/30/2023   VLDL 22 04/30/2023   LDLCALC 91 04/30/2023   LDLCALC 104 (H) 08/28/2018    See Psychiatric Specialty Exam and Suicide Risk Assessment completed by Attending Physician prior to discharge.  Discharge destination:  Home  Is patient on multiple antipsychotic therapies at discharge:  No   Has Patient had three or more failed trials of antipsychotic monotherapy by history:  No  Recommended Plan for Multiple Antipsychotic Therapies: NA   Allergies as of 07/04/2023   No Known Allergies      Medication List     STOP taking these medications    FLUoxetine 20 MG capsule Commonly known as: PROZAC   OLANZapine 10 MG tablet Commonly known as: ZYPREXA       TAKE these medications      Indication  hydrOXYzine 25 MG tablet Commonly known as: ATARAX Take 1 tablet (25 mg total) by mouth 3 (three) times daily as needed for anxiety or  itching.  Indication: Feeling Anxious   mirtazapine 15 MG tablet Commonly known as: REMERON Take 1 tablet (15 mg total) by mouth at bedtime.  Indication: Major Depressive Disorder   nicotine 14 mg/24hr patch Commonly known as: NICODERM CQ - dosed in mg/24 hours Place 1 patch (14 mg total) onto the skin daily.  Indication: Nicotine Addiction   traZODone 100 MG tablet Commonly known as: DESYREL Take 1 tablet (100 mg total) by mouth at bedtime as needed for sleep. What changed:  medication strength how much to take when to take this reasons to take this  Indication: Trouble Sleeping, Major Depressive Disorder   venlafaxine XR 150 MG 24 hr capsule Commonly known as: EFFEXOR-XR Take 1 capsule (150 mg total) by mouth daily with breakfast.  Indication: Major Depressive Disorder         Follow-up Information     Services, [facility]. Go on 07/05/2023.   Why: You have a hospital follow up appointment for therapy and/or medication management services on 07/05/23 at 11:00 am. This appointment will be held in person.  * If you do not wish to attend the appt, please make sure to call and cancel. Contact information: [address] [place] [address] [phone]         [facility] Go on 07/18/2023.   Why: You have an appointment for medication management services on  07/18/23 at   2:00 pm.  This appointment will be held in person. Contact information: [address] [place] [address] [phone]                 Plan Of Care/Follow-up recommendations:  Activity: as tolerated   Diet: heart healthy   Other: -Follow-up with your outpatient psychiatric provider -instructions on appointment date, time, and address (location) are provided to you in discharge paperwork.   -Take your psychiatric medications as prescribed at discharge - instructions are provided to you in the discharge  paperwork   -Follow-up with outpatient primary care  doctor and other specialists -for management of preventative medicine and chronic medical disease, including:  Scabies infestation, resolved at discharge Migraines, stable at discharge   -Testing: Follow-up with outpatient provider for abnormal lab results: NA   -Recommend abstinence from alcohol, tobacco, and other illicit drug use at discharge.    -If your psychiatric symptoms recur, worsen, or if you have side effects to your psychiatric medications, call your outpatient psychiatric provider, 911, [phone] or go to the nearest emergency department.   -If suicidal thoughts recur, call your outpatient psychiatric provider, 911, [phone] or go to the nearest emergency department.   Signed: Dr. Liston Alba, MD PGY-2, Psychiatry Residency  07/04/2023, 9:14 AM

## 2023-07-04 NOTE — [facility] Suicide Risk Assessment (Signed)
Suicide Risk Assessment  Discharge Assessment    [facility] Discharge Suicide Risk Assessment   Principal Problem: MDD (major depressive disorder) Discharge Diagnoses: Principal Problem:   MDD (major depressive disorder) Active Problems:   Generalized anxiety disorder   MDD (major depressive disorder), recurrent severe, without psychosis (HCC)   Housing instability after recent homelessness   Scabies   Total Time spent with patient: 15 minutes  Angel [name]. Angel Mcgill "[name]", is a 42 yo male with a PPHx of MDD, GAD, tobacco use disorder, chronic suicidality and self-harm, 1 suicide attempt by cutting wrist, and with a psychiatric hospitalization 2 weeks at [facility] prior to this admission for suicidal ideations with plan to hang himself. Patient has been voluntarily admitted to [facility] on 06/20/2023 for worsening depression and continued suicidal ideation in the setting of medication noncompliance and multiple psychosocial stressors including homelessness.   During the patient's hospitalization, patient had extensive initial psychiatric evaluation, and follow-up psychiatric evaluations every day.  Psychiatric diagnoses provided upon initial assessment:  MDD, recurrent, severe, without psychotic features  GAD  Patient's psychiatric medications were adjusted on admission:  Started fluoxetine 40 mg once daily for MDD, GAD Mirtazapine 7.5 mg once at night for MDD, insomnia Start hydroxyzine 25 mg as needed 3 times daily for anxiety Start trazodone 50 mg as needed once at night for insomnia  During the hospitalization, other adjustments were made to the patient's psychiatric medication regimen:  Fluoxetine was discontinued Patient was started on Effexor 75 mg initially, titrated up to 150 mg daily with breakfast for MDD, GAD Mirtazapine was increased from 7.5 mg to 15 mg nightly for MDD, insomnia Trazodone 50 mg was increased to 100 mg nightly as needed for insomnia  Patient's care was discussed  during the interdisciplinary team meeting every day during the hospitalization.  The patient denies having side effects to prescribed psychiatric medication.  Gradually, patient started adjusting to milieu. The patient was evaluated each day by a clinical provider to ascertain response to treatment. Improvement was noted by the patient's report of decreasing symptoms, improved sleep and appetite, affect, medication tolerance, behavior, and participation in unit programming.  Patient was asked each day to complete a self inventory noting mood, mental status, pain, new symptoms, anxiety and concerns.    Symptoms were reported as significantly decreased or resolved completely by discharge.   On day of discharge, the patient reports that their mood is stable.  Patient reports passive suicidal ideation, which is chronic and unchanged for more than 48 hours prior to discharge.  He denies any intent or plan. Patient denies having homicidal thoughts.  Patient denies having auditory hallucinations.  Patient denies any visual hallucinations or other symptoms of psychosis. The patient was motivated to continue taking medication with a goal of continued improvement in mental health.   The patient reports their target psychiatric symptoms of depression and anxiety all responded well to the psychiatric medications, and the patient reports overall benefit other psychiatric hospitalization. Supportive psychotherapy was provided to the patient. The patient also participated in regular group therapy while hospitalized. Coping skills, problem solving as well as relaxation therapies were also part of the unit programming.  Labs were reviewed with the patient, and abnormal results were discussed with the patient.  The patient is able to verbalize their individual safety plan to this provider.  # It is recommended to the patient to continue psychiatric medications as prescribed, after discharge from the hospital.    #  It is recommended to the  patient to follow up with your outpatient psychiatric provider and PCP.  # It was discussed with the patient, the impact of alcohol, drugs, tobacco have been there overall psychiatric and medical wellbeing, and total abstinence from substance use was recommended the patient.ed.  # Prescriptions provided or sent directly to preferred pharmacy at discharge. Patient agreeable to plan. Given opportunity to ask questions. Appears to feel comfortable with discharge.    # In the event of worsening symptoms, the patient is instructed to call the crisis hotline, 911 and or go to the nearest ED for appropriate evaluation and treatment of symptoms. To follow-up with primary care provider for other medical issues, concerns and or health care needs  # Patient was discharged Home with a plan to follow up as noted below.    Musculoskeletal: Strength & Muscle Tone: within normal limits Gait & Station: normal Patient leans: N/A  Psychiatric Specialty Exam:    Presentation  General Appearance: Appropriate for Environment; Casual   Eye Contact:Good   Speech:Clear and Coherent; Normal Rate   Speech Volume:Normal   Handedness:-- (Not assessed)     Mood and Affect  Mood:-- ("Good")   Affect:Constricted, depressed appearing     Thought Process  Thought Processes:Linear; Coherent; Goal Directed   Descriptions of Associations:Intact   Orientation:Full (Time, Place and Person)   Thought Content:Logical   History of Schizophrenia/Schizoaffective disorder:No   Duration of Psychotic Symptoms: N/A Hallucinations:Hallucinations: None   Ideas of Reference:None   Suicidal Thoughts:Suicidal Thoughts: Yes, Passive (chronic, unchanged) SI Active Intent and/or Plan: Without Intent; Without Plan SI Passive Intent and/or Plan: Without Intent; Without Plan   Homicidal Thoughts:Homicidal Thoughts: No     Sensorium  Memory:Immediate Fair; Recent Fair; Remote Fair    Judgment:Fair   Insight:Fair     Executive Functions  Concentration:Fair   Attention Span:Fair   Recall:Fair   Fund of Knowledge:Fair   Language:Fair     Psychomotor Activity  Psychomotor Activity:Psychomotor Activity: Normal     Assets  Assets:Communication Skills; Desire for Improvement     Sleep  Sleep:Good, 8.24 hours  Physical Exam: Physical Exam Constitutional:      General: He is not in acute distress.    Appearance: He is normal weight. He is not ill-appearing.  HENT:     Head: Normocephalic and atraumatic.  Pulmonary:     Effort: Pulmonary effort is normal. No respiratory distress.  Musculoskeletal:        General: Normal range of motion.  Skin:    General: Skin is warm and dry.  Neurological:     General: No focal deficit present.     Mental Status: He is alert.    Review of Systems  Psychiatric/Behavioral:  Positive for suicidal ideas. Negative for depression, hallucinations, memory loss and substance abuse. The patient is not nervous/anxious and does not have insomnia.   All other systems reviewed and are negative.  Blood pressure 107/83, pulse (!) 103, temperature 98.4 F (36.9 C), temperature source Oral, resp. rate 18, height 5\' 8"  (1.727 m), weight 71.2 kg, SpO2 98%. Body mass index is 23.87 kg/m.  Mental Status Per Nursing Assessment::   On Admission:  Suicidal ideation indicated by patient, Suicide plan, Self-harm behaviors  Demographic factors:  Male, Caucasian, Low socioeconomic status, Unemployed Current Mental Status:  Suicidal ideation indicated by patient, Suicide plan, Self-harm behaviors Loss Factors:  Financial problems / change in socioeconomic status Historical Factors:  Prior suicide attempts Risk Reduction Factors:  NA   Continued Clinical Symptoms:  More than one psychiatric diagnosis Unstable or Poor Therapeutic Relationship Previous Psychiatric Diagnoses and Treatments  Cognitive Features That Contribute To Risk:   None    Suicide Risk:  Mild: There are no identifiable suicide plans, no associated intent, mild dysphoria and related symptoms, good self-control (both objective and subjective assessment), few other risk factors, and identifiable protective factors, including available and accessible social support.    Follow-up Information     Services, [facility]. Go on 07/05/2023.   Why: You have a hospital follow up appointment for therapy and/or medication management services on 07/05/23 at 11:00 am. This appointment will be held in person.  * If you do not wish to attend the appt, please make sure to call and cancel. Contact information: [address] [place] [address] [phone]         [facility] Go on 07/18/2023.   Why: You have an appointment for medication management services on  07/18/23 at   2:00 pm.  This appointment will be held in person. Contact information: [address] [place] [address] [phone]                 Plan Of Care/Follow-up recommendations:  Activity: as tolerated  Diet: heart healthy  Other: -Follow-up with your outpatient psychiatric provider -instructions on appointment date, time, and address (location) are provided to you in discharge paperwork.  -Take your psychiatric medications as prescribed at discharge - instructions are provided to you in the discharge paperwork  -Follow-up with outpatient primary care doctor and other specialists -for management of preventative medicine and chronic medical disease, including:  Scabies infestation, resolved at discharge Migraines, stable at discharge  -Testing: Follow-up with outpatient provider for abnormal lab results: NA  -Recommend abstinence from alcohol, tobacco, and other illicit drug use at discharge.   -If your psychiatric symptoms recur, worsen, or if you have side effects to your psychiatric medications, call your outpatient psychiatric provider, 911,  988 or go to the nearest emergency department.  -If suicidal thoughts recur, call your outpatient psychiatric provider, 911, [phone] or go to the nearest emergency department.     Lorri Frederick, MD 07/04/2023, 9:14 AM

## 2023-07-07 NOTE — Group Note (Signed)
Date:  07/07/2023 Time:  11:19 AM  Group Topic/Focus:  Goals Group:   The focus of this group is to help patients establish daily goals to achieve during treatment and discuss how the patient can incorporate goal setting into their daily lives to aide in recovery. Orientation:   The focus of this group is to educate the patient on the purpose and policies of crisis stabilization and provide a format to answer questions about their admission.  The group details unit policies and expectations of patients while admitted.    Participation Level:  Did Not Attend  Participation Quality:   n/a  Affect:   n/a  Cognitive:   n/a  Insight: None  Engagement in Group:   n/a  Modes of Intervention:   n/a  Additional Comments:   Pt did not attend.  Edmund Hilda Asja Frommer 07/07/2023, 11:19 AM

## 2023-07-07 NOTE — Progress Notes (Signed)
Pt did not attend wrap-up group   

## 2023-07-07 NOTE — Group Note (Signed)
Date:  07/07/2023 Time:  4:25 PM  Group Topic/Focus:  Dimensions of Wellness:   The focus of this group is to introduce the topic of wellness and discuss the role each dimension of wellness plays in total health.    Participation Level:  Did Not Attend  Participation Quality:   n/a  Affect:   n/a  Cognitive:   n/a  Insight: None  Engagement in Group:   n/a  Modes of Intervention:   n/a  Additional Comments:   Pt did not attend.  Edmund Hilda Saman Umstead 07/07/2023, 4:25 PM

## 2023-07-29 ENCOUNTER — Encounter (HOSPITAL_COMMUNITY): Payer: Self-pay | Admitting: Emergency Medicine

## 2023-07-29 ENCOUNTER — Emergency Department (HOSPITAL_COMMUNITY)
Admission: EM | Admit: 2023-07-29 | Discharge: 2023-07-30 | Disposition: A | Discharge status: Home / Self Care | Payer: MEDICAID | Attending: Emergency Medicine | Admitting: Emergency Medicine

## 2023-07-29 ENCOUNTER — Other Ambulatory Visit: Payer: Self-pay

## 2023-07-29 DIAGNOSIS — F332 Major depressive disorder, recurrent severe without psychotic features: Secondary | ICD-10-CM | POA: Diagnosis present

## 2023-07-29 DIAGNOSIS — Z59811 Housing instability, housed, with risk of homelessness: Secondary | ICD-10-CM | POA: Insufficient documentation

## 2023-07-29 DIAGNOSIS — F17210 Nicotine dependence, cigarettes, uncomplicated: Secondary | ICD-10-CM | POA: Insufficient documentation

## 2023-07-29 DIAGNOSIS — Z23 Encounter for immunization: Secondary | ICD-10-CM | POA: Insufficient documentation

## 2023-07-29 DIAGNOSIS — S41111A Laceration without foreign body of right upper arm, initial encounter: Secondary | ICD-10-CM | POA: Insufficient documentation

## 2023-07-29 DIAGNOSIS — S41112A Laceration without foreign body of left upper arm, initial encounter: Secondary | ICD-10-CM | POA: Insufficient documentation

## 2023-07-29 DIAGNOSIS — W268XXA Contact with other sharp object(s), not elsewhere classified, initial encounter: Secondary | ICD-10-CM | POA: Diagnosis not present

## 2023-07-29 DIAGNOSIS — Z59812 Housing instability, housed, homelessness in past 12 months: Secondary | ICD-10-CM | POA: Diagnosis present

## 2023-07-29 DIAGNOSIS — R45851 Suicidal ideations: Secondary | ICD-10-CM | POA: Insufficient documentation

## 2023-07-29 LAB — ACETAMINOPHEN LEVEL: Acetaminophen (Tylenol), Serum: <10 ug/mL — ABNORMAL LOW (ref 10–30)

## 2023-07-29 LAB — RAPID URINE DRUG SCREEN, HOSP PERFORMED
Amphetamines: NOT DETECTED
Barbiturates: NOT DETECTED
Benzodiazepines: NOT DETECTED
Cocaine: NOT DETECTED
Opiates: NOT DETECTED
Tetrahydrocannabinol: NOT DETECTED

## 2023-07-29 LAB — COMPREHENSIVE METABOLIC PANEL
ALT: 24 U/L (ref 0–44)
AST: 21 U/L (ref 15–41)
Albumin: 3.9 g/dL (ref 3.5–5.0)
Alkaline Phosphatase: 82 U/L (ref 38–126)
Anion gap: 9 (ref 5–15)
BUN: 14 mg/dL (ref 6–20)
CO2: 24 mmol/L (ref 22–32)
Calcium: 8.6 mg/dL — ABNORMAL LOW (ref 8.9–10.3)
Chloride: 102 mmol/L (ref 98–111)
Creatinine, Ser: 0.75 mg/dL (ref 0.61–1.24)
GFR, Estimated: >60 mL/min (ref >60)
Glucose, Bld: 97 mg/dL (ref 70–99)
Potassium: 3.3 mmol/L — ABNORMAL LOW (ref 3.5–5.1)
Sodium: 135 mmol/L (ref 135–145)
Total Bilirubin: 0.5 mg/dL (ref 0.3–1.2)
Total Protein: 6.8 g/dL (ref 6.5–8.1)

## 2023-07-29 LAB — CBC
HCT: 39.1 % (ref 39.0–52.0)
Hemoglobin: 13 g/dL (ref 13.0–17.0)
MCH: 28.7 pg (ref 26.0–34.0)
MCHC: 33.2 g/dL (ref 30.0–36.0)
MCV: 86.3 fL (ref 80.0–100.0)
Platelets: 261 10*3/uL (ref 150–400)
RBC: 4.53 MIL/uL (ref 4.22–5.81)
RDW: 13.8 % (ref 11.5–15.5)
WBC: 10.2 10*3/uL (ref 4.0–10.5)
nRBC: 0 % (ref 0.0–0.2)

## 2023-07-29 LAB — ETHANOL: Alcohol, Ethyl (B): <10 mg/dL (ref <10)

## 2023-07-29 LAB — SALICYLATE LEVEL: Salicylate Lvl: <7 mg/dL — ABNORMAL LOW (ref 7.0–30.0)

## 2023-07-29 MED ORDER — MIRTAZAPINE 15 MG PO TABS
15.0000 mg | ORAL_TABLET | Freq: Every day | ORAL | Status: DC
  Administered 2023-07-29: 15 mg via ORAL
  Filled 2023-07-29: qty 1

## 2023-07-29 MED ORDER — HYDROXYZINE HCL 25 MG PO TABS
25.0000 mg | ORAL_TABLET | Freq: Three times a day (TID) | ORAL | Status: DC | PRN
  Administered 2023-07-29: 25 mg via ORAL
  Filled 2023-07-29: qty 1

## 2023-07-29 MED ORDER — TRAZODONE HCL 50 MG PO TABS: 100.0000 mg | ORAL_TABLET | Freq: Every evening | ORAL | Status: DC | PRN

## 2023-07-29 MED ORDER — NICOTINE 14 MG/24HR TD PT24: 14.0000 mg | MEDICATED_PATCH | Freq: Every day | TRANSDERMAL | Status: DC

## 2023-07-29 MED ORDER — TETANUS-DIPHTH-ACELL PERTUSSIS 5-2.5-18.5 LF-MCG/0.5 IM SUSY
0.5000 mL | PREFILLED_SYRINGE | Freq: Once | INTRAMUSCULAR | Status: AC
  Administered 2023-07-29: 0.5 mL via INTRAMUSCULAR
  Filled 2023-07-29: qty 0.5

## 2023-07-29 MED ORDER — VENLAFAXINE HCL ER 37.5 MG PO CP24: 150.0000 mg | ORAL_CAPSULE | Freq: Every day | ORAL | Status: DC

## 2023-07-29 NOTE — BH Assessment (Signed)
Clinician message Nolberto Hanlon. Rosalyn Charters, RN: "Hey. It's Angel [name] with TTS. Is the pt able to engage in the assessment, if so the pt will need to be placed in a private room. Is the pt under IVC? Also is the pt medically cleared?"   Clinician awaiting response.   Redmond Pulling, MS, [facility], [facility] Triage Specialist [phone]

## 2023-07-29 NOTE — ED Notes (Signed)
Patient evaluated by TTS

## 2023-07-29 NOTE — ED Notes (Addendum)
Patient reports SI for a couple of weeks with plan to jump off of a bridge. Reports he "came close" and when asked what stopped him, he could not say. Aggravating factors include homelessness and hopelessness. Patient has a psychiatric history with medications but reports they are not working. Patient has multiple lacerations noted to bilateral arms, no bleeding noted at this time. Patient appears anxious and is scratching at his legs during assessment. Patient administered PRN Hydroxyzine for symptoms. Patient calm and cooperative during assessment and lab collection. Denies any pain. Denies any drug or alcohol use

## 2023-07-29 NOTE — ED Provider Notes (Addendum)
[place] EMERGENCY DEPARTMENT AT [facility] Provider Note   CSN: [id number] Arrival date & time: 07/29/23  2142     History  Chief Complaint  Patient presents with   Suicidal    Angel [name] is a 42 y.o. male.  Patient states he has been feeling suicidal for a few weeks.  CA he did multiple lacerations superficial to both arms with a razor blade.  Said he cut himself about an hour ago patient [place] in triage.  Patient denies any ingestions alcohol or illicit drugs.  Patient is on several psych medications has been admitted by behavioral health before states he is taking his medications but they are not helping.  Patient's medications would include Atarax Remeron Desir Rill and Effexor ER.  Past medical history sniffing for depression anxiety migraine suicidal ideation history of admission inpatient psychiatry patient was admitted over the summer.  Patient is not certain if his tetanus is up-to-date.       Home Medications Prior to Admission medications   Medication Sig Start Date End Date Taking? Authorizing Provider  hydrOXYzine (ATARAX) 25 MG tablet Take 1 tablet (25 mg total) by mouth 3 (three) times daily as needed for anxiety or itching. 07/03/23 08/02/23  Massengill, Harrold Donath, MD  mirtazapine (REMERON) 15 MG tablet Take 1 tablet (15 mg total) by mouth at bedtime. 07/03/23 08/02/23  Massengill, Harrold Donath, MD  nicotine (NICODERM CQ - DOSED IN MG/24 HOURS) 14 mg/24hr patch Place 1 patch (14 mg total) onto the skin daily. 07/04/23   Massengill, Harrold Donath, MD  traZODone (DESYREL) 100 MG tablet Take 1 tablet (100 mg total) by mouth at bedtime as needed for sleep. 07/03/23   Massengill, Harrold Donath, MD  venlafaxine XR (EFFEXOR-XR) 150 MG 24 hr capsule Take 1 capsule (150 mg total) by mouth daily with breakfast. 07/04/23 08/03/23  Phineas Inches, MD      Allergies    Patient has no known allergies.    Review of Systems   Review of Systems  Constitutional:  Negative for chills and fever.   HENT:  Negative for ear pain and sore throat.   Eyes:  Negative for pain and visual disturbance.  Respiratory:  Negative for cough and shortness of breath.   Cardiovascular:  Negative for chest pain and palpitations.  Gastrointestinal:  Negative for abdominal pain and vomiting.  Genitourinary:  Negative for dysuria and hematuria.  Musculoskeletal:  Negative for arthralgias and back pain.  Skin:  Positive for wound. Negative for color change and rash.  Neurological:  Negative for seizures and syncope.  Psychiatric/Behavioral:  Positive for suicidal ideas.   All other systems reviewed and are negative.   Physical Exam Updated Vital Signs BP 137/82 (BP Location: Right Arm)   Pulse 98   Temp 98.4 F (36.9 C) (Oral)   Resp 16   Ht 1.727 m (5\' 8" )   Wt 71.7 kg   SpO2 97%   BMI 24.03 kg/m  Physical Exam Vitals and nursing note reviewed.  Constitutional:      General: He is not in acute distress.    Appearance: Normal appearance. He is well-developed.  HENT:     Head: Normocephalic and atraumatic.     Mouth/Throat:     Mouth: Mucous membranes are moist.  Eyes:     Extraocular Movements: Extraocular movements intact.     Conjunctiva/sclera: Conjunctivae normal.     Pupils: Pupils are equal, round, and reactive to light.  Cardiovascular:     Rate and Rhythm:  Normal rate and regular rhythm.     Heart sounds: No murmur heard. Pulmonary:     Effort: Pulmonary effort is normal. No respiratory distress.     Breath sounds: Normal breath sounds. No wheezing.  Abdominal:     Palpations: Abdomen is soft.     Tenderness: There is no abdominal tenderness.  Musculoskeletal:        General: Signs of injury present. No swelling.     Cervical back: Normal range of motion and neck supple.     Comments: Multiple superficial lacerations to the left arm and right arm.  No deep wounds.  Neurovascularly intact distally good movement of fingers.  No active bleeding.  Skin:    General: Skin is  warm and dry.     Capillary Refill: Capillary refill takes less than 2 seconds.  Neurological:     General: No focal deficit present.     Mental Status: He is alert and oriented to person, place, and time.     Cranial Nerves: No cranial nerve deficit.     Sensory: No sensory deficit.     Motor: No weakness.  Psychiatric:        Mood and Affect: Mood normal.     ED Results / Procedures / Treatments   Labs (all labs ordered are listed, but only abnormal results are displayed) Labs Reviewed  CBC  COMPREHENSIVE METABOLIC PANEL  ETHANOL  SALICYLATE LEVEL  ACETAMINOPHEN LEVEL  RAPID URINE DRUG SCREEN, HOSP PERFORMED    EKG None  Radiology No results found.  Procedures Procedures    Medications Ordered in ED Medications  Tdap (BOOSTRIX) injection 0.5 mL (has no administration in time range)    ED Course/ Medical Decision Making/ A&P                                 Medical Decision Making Amount and/or Complexity of Data Reviewed Labs: ordered.  Risk OTC drugs. Prescription drug management.   Patient's labs still pending.  CBC white count 10.2 hemoglobin 13.0 hematocrit 39.1 platelets 261.  Tylenol level salicylate level alcohol level complete metabolic panel and urine drug screen pending.  Patient clearly expressing suicidal ideation.  Will IVC.  Will order his current medications.  TTS to evaluate.  Need the rest of the labs to formally medically clear.  Tetanus will be updated.  Complete metabolic panel is back potassium slightly low at 3.3.  Renal function normal LFTs are normal.  Glucose is 97.  Alcohol less than 10 salicylate level less than 7 Tylenol level less than 10.  Urine drug screen pending.  But patient is medically cleared for evaluation by behavioral health.  Final diagnoses:  Suicidal ideation    Rx / DC Orders ED Discharge Orders     None         Vanetta Mulders, MD 07/29/23 [id number]    Vanetta Mulders, MD 07/29/23 2694     Vanetta Mulders, MD 07/29/23 2243

## 2023-07-29 NOTE — ED Notes (Signed)
IVC paperwork faxed to Magistrate.  

## 2023-07-29 NOTE — ED Triage Notes (Addendum)
Pt arrives POV c/o feeling suicidal for the past few weeks. Pt has multiple lacerations to bilateral arms, states he cut him self about 1 hr ago with a razor blade. Pt is tearful in triage at this time. Pt denies any ETOH use or illegal drugs. Pt states he is on several psych medications and has been taking all his medication as ordered but it is not working.

## 2023-07-30 DIAGNOSIS — Z59812 Housing instability, housed, homelessness in past 12 months: Secondary | ICD-10-CM

## 2023-07-30 DIAGNOSIS — F332 Major depressive disorder, recurrent severe without psychotic features: Secondary | ICD-10-CM

## 2023-07-30 DIAGNOSIS — R45851 Suicidal ideations: Secondary | ICD-10-CM

## 2023-07-30 NOTE — ED Notes (Signed)
IVC paperwork resended per MD. Pt given belongings and offered ride to homeless shelter. Pt refuses transportation or resources and wishes to walk. Encouraged pt to take the ride to somewhere safe but pt still refuses. Reviewed d/c paper work and homeless shelter resources w/ patient. Pt verbalized understanding.

## 2023-07-30 NOTE — ED Notes (Signed)
Patient received breakfast tray 

## 2023-07-30 NOTE — ED Notes (Signed)
IVC paperwork faxed to [facility] @ [phone] & [phone]

## 2023-07-30 NOTE — ED Notes (Signed)
Pt had TTS reeval

## 2023-07-30 NOTE — Consult Note (Signed)
Telepsych Consultation   Reason for Consult:  Suicidal Ideation  Referring Physician: Vanetta Mulders, MD  Location of Patient: [facility]  Location of Provider: Other: [facility]   Patient Identification: Angel [name] MRN:  [id number] Principal Diagnosis: MDD (major depressive disorder), recurrent severe, without psychosis (HCC) Diagnosis:  Principal Problem:   MDD (major depressive disorder), recurrent severe, without psychosis (HCC) Active Problems:   Suicidal ideation   Housing instability after recent homelessness   Total Time spent with patient: 30 minutes  Subjective:   Angel [name] is a 42 y.o. male with a history MDD, chronic/recurrent suicidal ideations, patient admitted with suicidal ideations and self harm under IVC petition on 07/29/2023 to [facility] Emergency Department.  Rosalie Doctor, 42 y.o., male patient seen via tele health by this provider, consulted with Dr. Lucianne Muss; and chart reviewed on 07/30/23.    HPI:   On evaluation, Angel [name], tells this Clinical research associate, that he came to the hospital due to being "fed up with everything". He endorses on-going depression and suicidal ideations despite three back to back psychiatric admissions due to suicidal ideations within the last 4 months. Patient tells this Clinical research associate he depressed his family nor friends will help him.  He reports that he was living with a few friends recently however they put him out and he [place] ever [place].  Patient is scratching his legs repetitively and tells this writer due to living in the woods he has been bitten by multiple mosquitoes which is causing him to have irritation to the bilateral legs.  Patient reports he has been taking his psychiatric medications from his last admission to behavioral health [facility] in July on a consistent basis as he was able to get refills of his medications.  He reports he feels that the  medication is not working with his depression and suicidal ideations.  On chart review patient has a history of recurrent chronic suicidal ideations dating back to December 2014. Patient reported a plan of jumping off of bridge however, today responds to this writer then asked about of a plan "I don't know" however, continues to endorses suicidal thoughts. He denies homicidals ideation, auditory hallucinations, or visual hallucinations.  Patient has no history of psychosis.  Patient also when arriving to the ED on yesterday had superficial lacerations to both of his abdomen and both of his arms.  Patient has also presented to the ED previously with multiple superficial cuts stating self-harm was related to SI.  Patient is unemployed and reports that none of his family members have any contact or communication with him. Patient endorses current SI present for more than 2 weeks.  Patient reports that he has been unable to follow-up with [facility] due to being unable to charge his phone and lack of transportation.  When asked if he has reached out to [facility] transportation he reports he has been unable to due to not being able to charge his mobile phone. Patient has been referred to [facility], however, has not been seen at their clinic recently. UDS negative today. Prior history of THC present in UDS x one month ago.  Past Psychiatric History: See HPI  Risk to Self:  Pt engages in superficial cutting. Endorses SI, however SI is chronic Risk to Others:  No Prior Inpatient Therapy:   Inpatient  Prior Outpatient Therapy:  Referred to [facility]    Past Medical History:  Past Medical History:  Diagnosis Date   Anxiety    Depression    History of admission to inpatient psychiatry department 05/03/2023   Migraine    Suicidal ideation 05/03/2023   History reviewed. No pertinent surgical history. Family History:  Family History  Problem Relation Age of Onset   Cancer Mother        brain   Diabetes Brother     Hypertension Brother    Family Psychiatric  History: No pertinent mental health history reported by patient  Social History:  Social History   Substance and Sexual Activity  Alcohol Use No     Social History   Substance and Sexual Activity  Drug Use Not Currently   Comment: denies use in the last month    Social History   Socioeconomic History   Marital status: Single    Spouse name: Not on file   Number of children: Not on file   Years of education: Not on file   Highest education level: Not on file  Occupational History   Not on file  Tobacco Use   Smoking status: Every Day    Current packs/day: 1.50    Average packs/day: 1.5 packs/day for 20.0 years (30.0 ttl pk-yrs)    Types: Cigarettes   Smokeless tobacco: Never  Vaping Use   Vaping status: Never Used  Substance and Sexual Activity   Alcohol use: No   Drug use: Not Currently    Comment: denies use in the last month   Sexual activity: Not Currently    Birth control/protection: Condom  Other Topics Concern   Not on file  Social History Narrative   Not on file   Social Determinants of Health   Financial Resource Strain: Not on file  Food Insecurity: Food Insecurity Present (06/20/2023)   Hunger Vital Sign    Worried About Running Out of Food in the Last Year: Often true    Ran Out of Food in the Last Year: Often true  Transportation Needs: Unmet Transportation Needs (06/20/2023)   PRAPARE - Administrator, Civil Service (Medical): Yes    Lack of Transportation (Non-Medical): Yes  Physical Activity: Not on file  Stress: Not on file  Social Connections: Not on file   Additional Social History:    Allergies:  No Known Allergies  Labs:  Results for orders placed or performed during the hospital encounter of 07/29/23 (from the past 48 hour(s))  Comprehensive metabolic panel     Status: Abnormal   Collection Time: 07/29/23 10:13 PM  Result Value Ref Range   Sodium 135 135 - 145 mmol/L    Potassium 3.3 (L) 3.5 - 5.1 mmol/L   Chloride 102 98 - 111 mmol/L   CO2 24 22 - 32 mmol/L   Glucose, Bld 97 70 - 99 mg/dL    Comment: Glucose reference range applies only to samples taken after fasting for at least 8 hours.   BUN 14 6 - 20 mg/dL   Creatinine, Ser 0.98 0.61 - 1.24 mg/dL   Calcium 8.6 (L) 8.9 - 10.3 mg/dL   Total Protein 6.8 6.5 - 8.1 g/dL   Albumin 3.9 3.5 - 5.0 g/dL   AST 21 15 - 41 U/L   ALT 24 0 - 44 U/L   Alkaline Phosphatase 82 38 - 126 U/L   Total Bilirubin 0.5 0.3 - 1.2 mg/dL   GFR, Estimated >11 >91 mL/min    Comment: (NOTE) Calculated using the CKD-EPI Creatinine Equation (2021)    Anion  gap 9 5 - 15    Comment: Performed at [facility], [address] [place], [place] [address]  Ethanol     Status: None   Collection Time: 07/29/23 10:13 PM  Result Value Ref Range   Alcohol, Ethyl (B) <10 <10 mg/dL    Comment: (NOTE) Lowest detectable limit for serum alcohol is 10 mg/dL.  For medical purposes only. Performed at [facility], [address], [place], [place] [address]   Salicylate level     Status: Abnormal   Collection Time: 07/29/23 10:13 PM  Result Value Ref Range   Salicylate Lvl <7.0 (L) 7.0 - 30.0 mg/dL    Comment: Performed at [facility], [address] [place], [place] [address]  Acetaminophen level     Status: Abnormal   Collection Time: 07/29/23 10:13 PM  Result Value Ref Range   Acetaminophen (Tylenol), Serum <10 (L) 10 - 30 ug/mL    Comment: (NOTE) Therapeutic concentrations vary significantly. A range of 10-30 ug/mL  may be an effective concentration for many patients. However, some  are best treated at concentrations outside of this range. Acetaminophen concentrations >150 ug/mL at 4 hours after ingestion  and >50 ug/mL at 12 hours after ingestion are often associated with  toxic reactions.  Performed at [facility], [address] [place], [place] [address]   cbc     Status: None   Collection Time: 07/29/23 10:13 PM  Result  Value Ref Range   WBC 10.2 4.0 - 10.5 K/uL   RBC 4.53 4.22 - 5.81 MIL/uL   Hemoglobin 13.0 13.0 - 17.0 g/dL   HCT 25.9 56.3 - 87.5 %   MCV 86.3 80.0 - 100.0 fL   MCH 28.7 26.0 - 34.0 pg   MCHC 33.2 30.0 - 36.0 g/dL   RDW 64.3 32.9 - 51.8 %   Platelets 261 150 - 400 K/uL   nRBC 0.0 0.0 - 0.2 %    Comment: Performed at [facility], [address] [place], [place] [address]  Rapid urine drug screen (hospital performed)     Status: None   Collection Time: 07/29/23 10:43 PM  Result Value Ref Range   Opiates NONE DETECTED NONE DETECTED   Cocaine NONE DETECTED NONE DETECTED   Benzodiazepines NONE DETECTED NONE DETECTED   Amphetamines NONE DETECTED NONE DETECTED   Tetrahydrocannabinol NONE DETECTED NONE DETECTED   Barbiturates NONE DETECTED NONE DETECTED    Comment: (NOTE) DRUG SCREEN FOR MEDICAL PURPOSES ONLY.  IF CONFIRMATION IS NEEDED FOR ANY PURPOSE, NOTIFY LAB WITHIN 5 DAYS.  LOWEST DETECTABLE LIMITS FOR URINE DRUG SCREEN Drug Class                     Cutoff (ng/mL) Amphetamine and metabolites    1000 Barbiturate and metabolites    200 Benzodiazepine                 200 Opiates and metabolites        300 Cocaine and metabolites        300 THC                            50 Performed at [facility], [address], [place], [place] [address]     Medications:  Current Facility-Administered Medications  Medication Dose Route Frequency Provider Last Rate Last Admin   hydrOXYzine (ATARAX) tablet 25 mg  25 mg Oral TID PRN Vanetta Mulders, MD  25 mg at 07/29/23 2244   mirtazapine (REMERON) tablet 15 mg  15 mg Oral QHS Vanetta Mulders, MD   15 mg at 07/29/23 2238   nicotine (NICODERM CQ - dosed in mg/24 hours) patch 14 mg  14 mg Transdermal Daily Vanetta Mulders, MD       traZODone (DESYREL) tablet 100 mg  100 mg Oral QHS PRN Vanetta Mulders, MD       venlafaxine XR (EFFEXOR-XR) 24 hr capsule 150 mg  150 mg Oral Q breakfast Vanetta Mulders, MD       Current Outpatient  Medications  Medication Sig Dispense Refill   hydrOXYzine (ATARAX) 25 MG tablet Take 1 tablet (25 mg total) by mouth 3 (three) times daily as needed for anxiety or itching. 30 tablet 0   mirtazapine (REMERON) 15 MG tablet Take 1 tablet (15 mg total) by mouth at bedtime. 30 tablet 0   traZODone (DESYREL) 100 MG tablet Take 1 tablet (100 mg total) by mouth at bedtime as needed for sleep. 30 tablet 0   venlafaxine XR (EFFEXOR-XR) 150 MG 24 hr capsule Take 1 capsule (150 mg total) by mouth daily with breakfast. 30 capsule 0   nicotine (NICODERM CQ - DOSED IN MG/24 HOURS) 14 mg/24hr patch Place 1 patch (14 mg total) onto the skin daily. (Patient not taking: Reported on 07/30/2023) 28 patch 0    Musculoskeletal: Strength & Muscle Tone: flaccid Gait & Station: normal Patient leans: N/A      Psychiatric Specialty Exam:  Presentation  General Appearance:  Bizarre; Disheveled  Eye Contact: Fair  Speech: Clear and Coherent  Speech Volume: Normal  Handedness: Right   Mood and Affect  Mood: Dysphoric  Affect: Blunt   Thought Process  Thought Processes: Linear  Descriptions of Associations:Intact  Orientation:Full (Time, Place and Person)  Thought Content:Logical  History of Schizophrenia/Schizoaffective disorder:No  Duration of Psychotic Symptoms:No data recorded Hallucinations:Hallucinations: None  Ideas of Reference:None  Suicidal Thoughts:Suicidal Thoughts: Yes, Active (suicidal thoughts is per patient is related to homelessness and family and friends will not provide him with help)  Homicidal Thoughts:Homicidal Thoughts: No   Sensorium  Memory: Immediate Good; Recent Good; Remote Good  Judgment: Fair  Insight: Fair   Chartered certified accountant: Fair  Attention Span: Fair  Recall: Good  Fund of Knowledge: Good  Language: Good   Psychomotor Activity  Psychomotor Activity:No data recorded  Assets  Assets: Communication  Skills; Desire for Improvement   Sleep  Sleep: Sleep: Fair Number of Hours of Sleep: 0 (A few hours here and there)    Physical Exam: Physical Exam Speaking in clear sentences. Breathing pattern is audibly normal.  Patient is asking and responding to questions appropriately.   Review of Systems  Musculoskeletal:  Negative for back pain.  Skin:  Positive for itching and rash.  Psychiatric/Behavioral:  Positive for depression and suicidal ideas.     Blood pressure 137/82, pulse 98, temperature 98.4 F (36.9 C), temperature source Oral, resp. rate 16, height 5\' 8"  (1.727 m), weight 71.7 kg, SpO2 97%. Body mass index is 24.03 kg/m.  Treatment Plan Summary: Patient has not benefited from past inpatient psychiatric hospitalizations under similar circumstances in terms of suicide risk modification or improvement in mental health or social conditions. Patient's repeated use of emergency department and inpatient services instead of recommended outpatient follow-up and homeless shelter recommendations are ineffective in helping improvement. Inpatient hospitalization is not indicated and patient has refused housing/shelter resources and transportation resources offered to him during  this ED admission.   Plan Discharge with  shelter resources and public transportation resources in order for patient to request transportation services to [facility].   Disposition: No evidence of imminent risk to self or others at present.   Patient does not meet criteria for psychiatric inpatient admission. Discussed crisis plan, support from social network, calling 911, coming to the Emergency Department, and calling Suicide Hotline.    This service was provided via telemedicine using a 2-way, interactive audio and video technology.  Names of all persons participating in this telemedicine service and their role in this encounter. Name: Augusta Propson  Role: Patient   Name: Godfrey Pick. Rayel Santizo  Role:  Psychiatric Provider     Joaquin Courts, NP 07/30/2023 12:35 PM

## 2023-07-30 NOTE — BH Assessment (Addendum)
Comprehensive Clinical Assessment (CCA) Note  07/30/2023 Angel [name] [id number]  Disposition: Angel Bering, NP recommends pt to be observed and reassessed by psychiatry. Disposition discussed with Angel [name] and Angel [name], Paramedic via secure message.   The patient demonstrates the following risk factors for suicide: Chronic risk factors for suicide include: psychiatric disorder of  Major Depressive Disorder, recurrent, severe without psychotic features and completed suicide in a family member. Acute risk factors for suicide include: unemployment, social withdrawal/isolation, and homelessness . Protective factors for this patient include:  None . Considering these factors, the overall suicide risk at this point appears to be high. Patient is not appropriate for outpatient follow up.  Angel [name] is a 42 year old male who presents involuntary and unaccompanied to [facility]. Clinician asked the pt, "what brought you to the hospital?" Pt reports, he's been suicidal for two weeks; things in life and his living situation as the triggers. Pt reports, he was going to jump off a bridge he as ready to jump over the railing but he stopped. Per pt, he doesn't know why he stopped. Pt reports, his brothers birthday is today (07/29/2025), he committed suicidal a couple of years ago. Pt reports, he's been homeless for two months; his friends left him to fend for himself, everyone just left, he had nothing to eat and nowhere to stay. Pt reports, getting food stamps, eating sandwiches. Clinician observed cuts on both his arms. Pt reports, he cut his both arms and his stomach with a razor. Pt denies, HI, AVH.   Pt was IVC'd by the EDP. Per IVC paperwork: "Patient voicing suicidal ideations and did superficial cuts, self-inflicted to both arms."   Pt denies, substance use. Pt's UDS is negative. Pt reports, he's linked to Angel [name] for medication management. Pt reports, he can't  remember the names of his medications. Per chart, pt has previous inpatient admission at [facility] from 06/20/2023-07/04/2023 for Major Depressive Disorder.   Pt presents quiet, awake in scrubs, with fair eye contact. During the assessment pt scratching his legs. Pt's mood was anxious, depressed. Pt's affect was congruent. Pt's insight was fair. Pt's judgement was poor. Clinician discussed the three possible dispositions (discharged with OPT resources, observe/reassess by psychiatry or inpatient treatment) in detail. Pt reports, if discharged he can not contract for safety.   *Pt reports, denies supports, his family will not help him.*   Chief Complaint:  Chief Complaint  Patient presents with   Suicidal   Visit Diagnosis: Major Depressive Disorder, recurrent, severe without psychotic features.    CCA Screening, Triage and Referral (STR)  Patient Reported Information How did you hear about [place]? Self  What Is the Reason for Your Visit/Call Today? Pt reports, he's been suicidal, was about to jump off a bridge but stopped himself. Pt reports, he's been homeless for two months after his room mates left him to fend for himself.Pt denies, HI, AVH and self-injurious behaviors.  How Long Has This Been Causing You Problems? <Week  What Do You Feel Would Help You the Most Today? Medication(s); Stress Management; Housing Assistance; Treatment for Depression or other mood problem   Have You Recently Had Any Thoughts About Hurting Yourself? Yes  Are You Planning to Commit Suicide/Harm Yourself At This time? Yes   Flowsheet Row ED from 07/29/2023 in [facility] Emergency Department at [facility] Most recent reading at 07/29/2023  9:50 PM [facility] Most recent reading at 06/20/2023  5:35 PM ED from 06/20/2023 in [facility] Emergency Department at [facility] Most recent reading at 06/20/2023  7:45 AM  C-SSRS  RISK CATEGORY High Risk High Risk Moderate Risk       Have you Recently Had Thoughts About Hurting Someone Angel [name]? No  Are You Planning to Harm Someone at This Time? No  Explanation: Pt denies, HI.   Have You Used Any Alcohol or Drugs in the Past 24 Hours? No  What Did You Use and How Much? Pt denies, substance use.   Do You Currently Have a Therapist/Psychiatrist? Yes  Name of Therapist/Psychiatrist: Name of Therapist/Psychiatrist: Pt reports, he linked to Angel [name] for medication management.   Have You Been Recently Discharged From Any Office Practice or Programs? Yes  Explanation of Discharge From Practice/Program: Pt was discharged from [facility] on 07/04/2023 after a 14 days admission.     CCA Screening Triage Referral Assessment Type of Contact: Tele-Assessment  Telemedicine Service Delivery: Telemedicine service delivery: This service was provided via telemedicine using a 2-way, interactive audio and video technology  Is this Initial or Reassessment? Is this Initial or Reassessment?: Initial Assessment  Date Telepsych consult ordered in [facility]:  Date Telepsych consult ordered in [facility]: 07/29/23  Time Telepsych consult ordered in [facility]:  Time Telepsych consult ordered in [facility]: 2235  Location of Assessment: [facility]  Provider Location: [facility] Assessment Services   Collateral Involvement: None.   Does Patient Have a Automotive engineer Guardian? No  Legal Guardian Contact Information: Pt is his own guardian.  Copy of Legal Guardianship Form: -- (Pt is his own guardian.)  Legal Guardian Notified of Arrival: -- (Pt is his own guardian.)  Legal Guardian Notified of Pending Discharge: -- (Pt is his own guardian.)  If Minor and Not Living with Parent(s), Who has Custody? Pt is an adult.  Is CPS involved or ever been involved? Never  Is APS involved or ever been involved? Never   Patient Determined To Be At Risk for Harm To Self or Others Based on Review  of Patient Reported Information or Presenting Complaint? Yes, for Self-Harm  Method: Plan with intent and identified person  Availability of Means: Has close by  Intent: Clearly intends on inflicting harm that could cause death  Notification Required: No need or identified person  Additional Information for Danger to Others Potential: -- (Pt denies, HI.)  Additional Comments for Danger to Others Potential: Pt denies, HI.  Are There Guns or Other Weapons in Your Home? No  Types of Guns/Weapons: Pt denies, access to weapons.  Are These Weapons Safely Secured?                            -- (Pt denies, access to weapons.)  Who Could Verify You Are Able To Have These Secured: Pt denies, access to weapons.  Do You Have any Outstanding Charges, Pending Court Dates, Parole/Probation? Pt denies legal involvement.  Contacted To Inform of Risk of Harm To Self or Others: Other: Comment (None.)    Does Patient Present under Involuntary Commitment? Yes    [place] of Residence: [place]   Patient Currently Receiving the Following Services: Medication Management   Determination of Need: Urgent (48 hours)   Options For Referral: Inpatient Hospitalization; Outpatient Therapy; Medication Management; BH Urgent Care     CCA Biopsychosocial Patient Reported Schizophrenia/Schizoaffective Diagnosis in Past: No   Strengths:  Pt is seeking help.   Mental Health Symptoms Depression:   Difficulty Concentrating; Fatigue; Hopelessness; Worthlessness; Tearfulness; Sleep (too much or little); Increase/decrease in appetite (Isolation.)   Duration of Depressive symptoms:  Duration of Depressive Symptoms: Greater than two weeks   Mania:   None   Anxiety:    Difficulty concentrating; Fatigue; Irritability; Worrying   Psychosis:   None   Duration of Psychotic symptoms:    Trauma:   None   Obsessions:   None   Compulsions:   None   Inattention:   Disorganized; Forgetful;  Loses things   Hyperactivity/Impulsivity:   Feeling of restlessness; Fidgets with hands/feet   Oppositional/Defiant Behaviors:   -- (Pt reports, "I don't know anymore.")   Emotional Irregularity:   Recurrent suicidal behaviors/gestures/threats; Potentially harmful impulsivity   Other Mood/Personality Symptoms:   Symptoms of depression and anxiety.    Mental Status Exam Appearance and self-care  Stature:   Average   Weight:   Average weight   Clothing:   Disheveled   Grooming:   Neglected   Cosmetic use:   None   Posture/gait:   Normal   Motor activity:   Restless   Sensorium  Attention:   Normal   Concentration:   Normal   Orientation:   X5   Recall/memory:   Normal   Affect and Mood  Affect:   Congruent   Mood:   Anxious; Depressed   Relating  Eye contact:   Normal   Facial expression:   Anxious   Attitude toward examiner:   Cooperative   Thought and Language  Speech flow:  Normal   Thought content:   Appropriate to Mood and Circumstances   Preoccupation:   None   Hallucinations:   None   Organization:   Coherent   [employer] of Knowledge:   Fair   Intelligence:   Average   Abstraction:   Normal   Judgement:   Poor   Reality Testing:   Adequate   Insight:   Fair   Decision Making:   Impulsive   Social Functioning  Social Maturity:   Impulsive   Social Judgement:   "Street Smart"   Stress  Stressors:   Other (Comment); Housing (Pt reports, "I rather be dead, I'm tired of this.")   Coping Ability:   Overwhelmed; Deficient supports   Skill Deficits:   Decision making; Self-care   Supports:   Support needed     Religion: Religion/Spirituality Are You A Religious Person?: No How Might This Affect Treatment?: None.  Leisure/Recreation: Leisure / Recreation Do You Have Hobbies?: Yes Leisure and Hobbies: Pt reports, "I like to draw."  Exercise/Diet: Exercise/Diet Do You  Exercise?: No Have You Gained or Lost A Significant Amount of Weight in the Past Six Months?: No Number of Pounds Lost?: 0 Do You Follow a Special Diet?: No Do You Have Any Trouble Sleeping?: Yes Explanation of Sleeping Difficulties: Pt reports, decreased sleep.   CCA Employment/Education Employment/Work Situation: Employment / Work Situation Employment Situation: Unemployed Patient's Job has Been Impacted by Current Illness: No Has Patient ever Been in Equities trader?: No  Education: Education Is Patient Currently Attending School?: No Last Grade Completed: 10 Did You Product manager?: No Did You Have An Individualized Education Program (IIEP): No Did You Have Any Difficulty At School?: No Patient's Education Has Been Impacted by Current Illness: No   CCA Family/Childhood History Family and Relationship History: Family history Marital status: Single Does patient have children?: No  Childhood History:  Childhood History By whom was/is the patient raised?: Mother Description of patient's current relationship with siblings: Pt reports, today is his brother Did patient suffer any verbal/emotional/physical/sexual abuse as a child?: No Did patient suffer from severe childhood neglect?: No Has patient ever been sexually abused/assaulted/raped as an adolescent or adult?: No Was the patient ever a victim of a crime or a disaster?: No Witnessed domestic violence?: No Has patient been affected by domestic violence as an adult?: No   CCA Substance Use Alcohol/Drug Use: Alcohol / Drug Use Pain Medications: See MAR Prescriptions: See MAR Over the Counter: See MAR History of alcohol / drug use?: No history of alcohol / drug abuse Longest period of sobriety (when/how long): Pt denies, substance use. Negative Consequences of Use:  (None.) Withdrawal Symptoms: None Substance #1 Name of Substance 1: Pt denies, substance use. 1 - Age of First Use: Pt denies, substance use. 1 -  Amount (size/oz): Pt denies, substance use. 1 - Frequency: Pt denies, substance use. 1 - Duration: Pt denies, substance use. 1 - Last Use / Amount: Pt denies, substance use. 1 - Method of Aquiring: Pt denies, substance use. 1- Route of Use: Pt denies, substance use.    ASAM's:  Six Dimensions of Multidimensional Assessment  Dimension 1:  Acute Intoxication and/or Withdrawal Potential:   Dimension 1:  Description of individual's past and current experiences of substance use and withdrawal: None.  Dimension 2:  Biomedical Conditions and Complications:   Dimension 2:  Description of patient's biomedical conditions and  complications: None.  Dimension 3:  Emotional, Behavioral, or Cognitive Conditions and Complications:  Dimension 3:  Description of emotional, behavioral, or cognitive conditions and complications: None.  Dimension 4:  Readiness to Change:  Dimension 4:  Description of Readiness to Change criteria: None.  Dimension 5:  Relapse, Continued use, or Continued Problem Potential:  Dimension 5:  Relapse, continued use, or continued problem potential critiera description: None.  Dimension 6:  Recovery/Living Environment:  Dimension 6:  Recovery/Iiving environment criteria description: None.  ASAM Severity Score:    ASAM Recommended Level of Treatment: ASAM Recommended Level of Treatment:  (None.)   Substance use Disorder (SUD) Substance Use Disorder (SUD)  Checklist Symptoms of Substance Use:  (None.)  Recommendations for Services/Supports/Treatments: Recommendations for Services/Supports/Treatments Recommendations For Services/Supports/Treatments: Other (Comment) (Pt to be observed and reassessed by psychiatry.)  Discharge Disposition: Discharge Disposition Medical Exam completed: Yes  DSM5 Diagnoses: Patient Active Problem List   Diagnosis Date Noted   Housing instability after recent homelessness 06/21/2023   Scabies 06/21/2023   MDD (major depressive disorder) 06/20/2023    Suicidal ideation 05/03/2023   GERD (gastroesophageal reflux disease) 03/11/2018   Tobacco use disorder 07/17/2015   Generalized anxiety disorder 11/01/2013   MDD (major depressive disorder), recurrent severe, without psychosis (HCC) 10/31/2013     Referrals to Alternative Service(s): Referred to Alternative Service(s):   Place:   Date:   Time:    Referred to Alternative Service(s):   Place:   Date:   Time:    Referred to Alternative Service(s):   Place:   Date:   Time:    Referred to Alternative Service(s):   Place:   Date:   Time:     Redmond Pulling, [facility] Comprehensive Clinical Assessment (CCA) Screening, Triage and Referral Note  07/30/2023 Angel [name] [id number]  Chief Complaint:  Chief Complaint  Patient presents with   Suicidal   Visit Diagnosis:   Patient Reported Information How did  you hear about [place]? Self  What Is the Reason for Your Visit/Call Today? Pt reports, he's been suicidal, was about to jump off a bridge but stopped himself. Pt reports, he's been homeless for two months after his room mates left him to fend for himself.Pt denies, HI, AVH and self-injurious behaviors.  How Long Has This Been Causing You Problems? <Week  What Do You Feel Would Help You the Most Today? Medication(s); Stress Management; Housing Assistance; Treatment for Depression or other mood problem   Have You Recently Had Any Thoughts About Hurting Yourself? Yes  Are You Planning to Commit Suicide/Harm Yourself At This time? Yes   Have you Recently Had Thoughts About Hurting Someone Angel [name]? No  Are You Planning to Harm Someone at This Time? No  Explanation: Pt denies, HI.   Have You Used Any Alcohol or Drugs in the Past 24 Hours? No  How Long Ago Did You Use Drugs or Alcohol? None.  What Did You Use and How Much? Pt denies, substance use.   Do You Currently Have a Therapist/Psychiatrist? Yes  Name of Therapist/Psychiatrist: Pt reports, he linked to Angel [name] for medication management.   Have You Been Recently Discharged From Any Office Practice or Programs? Yes  Explanation of Discharge From Practice/Program: Pt was discharged from [facility] on 07/04/2023 after a 14 days admission.    CCA Screening Triage Referral Assessment Type of Contact: Tele-Assessment  Telemedicine Service Delivery: Telemedicine service delivery: This service was provided via telemedicine using a 2-way, interactive audio and video technology  Is this Initial or Reassessment? Is this Initial or Reassessment?: Initial Assessment  Date Telepsych consult ordered in [facility]:  Date Telepsych consult ordered in [facility]: 07/29/23  Time Telepsych consult ordered in [facility]:  Time Telepsych consult ordered in [facility]: 2235  Location of Assessment: [facility]  Provider Location: [facility] Assessment Services    Collateral Involvement: None.   Does Patient Have a Automotive engineer Guardian? No. Name and Contact of Legal Guardian: Pt is his own guardian.  If Minor and Not Living with Parent(s), Who has Custody? Pt is an adult.  Is CPS involved or ever been involved? Never  Is APS involved or ever been involved? Never   Patient Determined To Be At Risk for Harm To Self or Others Based on Review of Patient Reported Information or Presenting Complaint? Yes, for Self-Harm  Method: Plan with intent and identified person  Availability of Means: Has close by  Intent: Clearly intends on inflicting harm that could cause death  Notification Required: No need or identified person  Additional Information for Danger to Others Potential: -- (Pt denies, HI.)  Additional Comments for Danger to Others Potential: Pt denies, HI.  Are There Guns or Other Weapons in Your Home? No  Types of Guns/Weapons: Pt denies, access to weapons.  Are These Weapons Safely Secured?                            -- (Pt denies, access to weapons.)  Who Could Verify You Are Able To Have These Secured: Pt  denies, access to weapons.  Do You Have any Outstanding Charges, Pending Court Dates, Parole/Probation? Pt denies legal involvement.  Contacted To Inform of Risk of Harm To Self or Others: Other: Comment (None.)   Does Patient Present under Involuntary Commitment? Yes    [place] of Residence: [place]   Patient Currently Receiving the Following Services: Medication Management  Determination of Need: Urgent (48 hours)   Options For Referral: Inpatient Hospitalization; Outpatient Therapy; Medication Management; BH Urgent Care   Discharge Disposition:  Discharge Disposition Medical Exam completed: Yes  Redmond Pulling, [facility]     Redmond Pulling, MS, [facility], [facility] Triage Specialist [phone]

## 2023-07-30 NOTE — Discharge Instructions (Addendum)
Homeless Shelter: [facility] [address] [place], [place], [place] [address]  31 mi [phone] Open  Closes 4 PM (Labor Day, hours may vary)    [facility]  [address] [place], [place] [address]  Public Transportation Access (medicaid transportation) DIAL [phone] TO SPEAK WITH A TRANSPORTATION REPRESENTATIVE.

## 2023-08-30 ENCOUNTER — Other Ambulatory Visit: Payer: Self-pay

## 2023-08-30 ENCOUNTER — Emergency Department (HOSPITAL_COMMUNITY)
Admission: EM | Admit: 2023-08-30 | Discharge: 2023-08-31 | Disposition: A | Discharge status: Other Acute Inpatient Hospital | Payer: MEDICAID | Attending: Emergency Medicine | Admitting: Emergency Medicine

## 2023-08-30 ENCOUNTER — Encounter (HOSPITAL_COMMUNITY): Payer: Self-pay

## 2023-08-30 DIAGNOSIS — R45851 Suicidal ideations: Secondary | ICD-10-CM | POA: Insufficient documentation

## 2023-08-30 DIAGNOSIS — Z5900 Homelessness unspecified: Secondary | ICD-10-CM | POA: Diagnosis not present

## 2023-08-30 DIAGNOSIS — Z59812 Housing instability, housed, homelessness in past 12 months: Secondary | ICD-10-CM | POA: Diagnosis present

## 2023-08-30 DIAGNOSIS — F329 Major depressive disorder, single episode, unspecified: Secondary | ICD-10-CM | POA: Insufficient documentation

## 2023-08-30 DIAGNOSIS — E876 Hypokalemia: Secondary | ICD-10-CM | POA: Diagnosis not present

## 2023-08-30 DIAGNOSIS — F4321 Adjustment disorder with depressed mood: Secondary | ICD-10-CM | POA: Diagnosis present

## 2023-08-30 DIAGNOSIS — F17210 Nicotine dependence, cigarettes, uncomplicated: Secondary | ICD-10-CM | POA: Diagnosis not present

## 2023-08-30 DIAGNOSIS — F332 Major depressive disorder, recurrent severe without psychotic features: Secondary | ICD-10-CM | POA: Diagnosis present

## 2023-08-30 DIAGNOSIS — F339 Major depressive disorder, recurrent, unspecified: Secondary | ICD-10-CM

## 2023-08-30 LAB — COMPREHENSIVE METABOLIC PANEL
ALT: 23 U/L (ref 0–44)
AST: 17 U/L (ref 15–41)
Albumin: 4 g/dL (ref 3.5–5.0)
Alkaline Phosphatase: 73 U/L (ref 38–126)
Anion gap: 10 (ref 5–15)
BUN: 14 mg/dL (ref 6–20)
CO2: 24 mmol/L (ref 22–32)
Calcium: 8.8 mg/dL — ABNORMAL LOW (ref 8.9–10.3)
Chloride: 103 mmol/L (ref 98–111)
Creatinine, Ser: 0.74 mg/dL (ref 0.61–1.24)
GFR, Estimated: >60 mL/min (ref >60)
Glucose, Bld: 90 mg/dL (ref 70–99)
Potassium: 3.2 mmol/L — ABNORMAL LOW (ref 3.5–5.1)
Sodium: 137 mmol/L (ref 135–145)
Total Bilirubin: 0.3 mg/dL (ref 0.3–1.2)
Total Protein: 7.1 g/dL (ref 6.5–8.1)

## 2023-08-30 LAB — CBC WITH DIFFERENTIAL/PLATELET
Abs Immature Granulocytes: 0.01 10*3/uL (ref 0.00–0.07)
Basophils Absolute: 0.1 10*3/uL (ref 0.0–0.1)
Basophils Relative: 1 %
Eosinophils Absolute: 0.2 10*3/uL (ref 0.0–0.5)
Eosinophils Relative: 3 %
HCT: 38.9 % — ABNORMAL LOW (ref 39.0–52.0)
Hemoglobin: 12.9 g/dL — ABNORMAL LOW (ref 13.0–17.0)
Immature Granulocytes: 0 %
Lymphocytes Relative: 54 %
Lymphs Abs: 4 10*3/uL (ref 0.7–4.0)
MCH: 29.7 pg (ref 26.0–34.0)
MCHC: 33.2 g/dL (ref 30.0–36.0)
MCV: 89.4 fL (ref 80.0–100.0)
Monocytes Absolute: 0.7 10*3/uL (ref 0.1–1.0)
Monocytes Relative: 10 %
Neutro Abs: 2.3 10*3/uL (ref 1.7–7.7)
Neutrophils Relative %: 32 %
Platelets: 282 10*3/uL (ref 150–400)
RBC: 4.35 MIL/uL (ref 4.22–5.81)
RDW: 14.2 % (ref 11.5–15.5)
WBC: 7.3 10*3/uL (ref 4.0–10.5)
nRBC: 0.3 % — ABNORMAL HIGH (ref 0.0–0.2)

## 2023-08-30 LAB — RAPID URINE DRUG SCREEN, HOSP PERFORMED
Amphetamines: NOT DETECTED
Barbiturates: NOT DETECTED
Benzodiazepines: NOT DETECTED
Cocaine: NOT DETECTED
Opiates: NOT DETECTED
Tetrahydrocannabinol: NOT DETECTED

## 2023-08-30 LAB — ETHANOL: Alcohol, Ethyl (B): <10 mg/dL (ref <10)

## 2023-08-30 MED ORDER — VENLAFAXINE HCL ER 37.5 MG PO CP24
150.0000 mg | ORAL_CAPSULE | Freq: Every day | ORAL | Status: DC
  Administered 2023-08-31: 150 mg via ORAL
  Filled 2023-08-30: qty 4

## 2023-08-30 MED ORDER — NICOTINE 14 MG/24HR TD PT24
14.0000 mg | MEDICATED_PATCH | Freq: Every day | TRANSDERMAL | Status: DC
  Administered 2023-08-30 – 2023-08-31 (×2): 14 mg via TRANSDERMAL
  Filled 2023-08-30 (×2): qty 1

## 2023-08-30 MED ORDER — MIRTAZAPINE 15 MG PO TABS
15.0000 mg | ORAL_TABLET | Freq: Every day | ORAL | Status: DC
  Administered 2023-08-30: 15 mg via ORAL
  Filled 2023-08-30: qty 1

## 2023-08-30 MED ORDER — POTASSIUM CHLORIDE CRYS ER 20 MEQ PO TBCR
40.0000 meq | EXTENDED_RELEASE_TABLET | Freq: Once | ORAL | Status: AC
  Administered 2023-08-30: 40 meq via ORAL
  Filled 2023-08-30: qty 2

## 2023-08-30 MED ORDER — TRAZODONE HCL 50 MG PO TABS: 100.0000 mg | ORAL_TABLET | Freq: Every evening | ORAL | Status: DC | PRN

## 2023-08-30 NOTE — ED Notes (Signed)
Pt wanded by security after changing into burgundy scrubs.

## 2023-08-30 NOTE — ED Notes (Signed)
Patient being evaluated by Denzil Magnuson

## 2023-08-30 NOTE — ED Notes (Signed)
ED Provider at bedside. 

## 2023-08-30 NOTE — ED Provider Notes (Signed)
[place] EMERGENCY DEPARTMENT AT [facility] Provider Note   CSN: [id number] Arrival date & time: 08/30/23  1516     History  No chief complaint on file.   Angel [name] is a 42 y.o. male.  Pt is a 42 yo male with pmhx significant for depression, anxiety, and homelessness.  Pt said he had thoughts of suicide today with a plan of jumping off a bridge.  Pt said a friend talked him out of it and he came here instead.  He has not been taking his meds.  He has been living outside.       Home Medications Prior to Admission medications   Medication Sig Start Date End Date Taking? Authorizing Provider  mirtazapine (REMERON) 15 MG tablet Take 1 tablet (15 mg total) by mouth at bedtime. 07/03/23 08/30/23 Yes Massengill, Harrold Donath, MD  nicotine (NICODERM CQ - DOSED IN MG/24 HOURS) 14 mg/24hr patch Place 1 patch (14 mg total) onto the skin daily. 07/04/23  Yes Massengill, Harrold Donath, MD  traZODone (DESYREL) 100 MG tablet Take 1 tablet (100 mg total) by mouth at bedtime as needed for sleep. 07/03/23  Yes Massengill, Harrold Donath, MD  venlafaxine XR (EFFEXOR-XR) 150 MG 24 hr capsule Take 1 capsule (150 mg total) by mouth daily with breakfast. 07/04/23 08/30/23 Yes Massengill, Harrold Donath, MD      Allergies    Patient has no known allergies.    Review of Systems   Review of Systems  Psychiatric/Behavioral:  Positive for dysphoric mood and suicidal ideas.   All other systems reviewed and are negative.   Physical Exam Updated Vital Signs BP 115/84   Pulse 93   Temp 99.2 F (37.3 C) (Oral)   Resp 18   Wt 74.8 kg   SpO2 99%   BMI 25.09 kg/m  Physical Exam Vitals and nursing note reviewed.  Constitutional:      Appearance: Normal appearance.  HENT:     Head: Normocephalic and atraumatic.     Right Ear: External ear normal.     Left Ear: External ear normal.     Nose: Nose normal.     Mouth/Throat:     Mouth: Mucous membranes are moist.     Pharynx: Oropharynx is clear.  Eyes:      Extraocular Movements: Extraocular movements intact.     Conjunctiva/sclera: Conjunctivae normal.     Pupils: Pupils are equal, round, and reactive to light.  Cardiovascular:     Rate and Rhythm: Normal rate and regular rhythm.     Pulses: Normal pulses.     Heart sounds: Normal heart sounds.  Pulmonary:     Effort: Pulmonary effort is normal.     Breath sounds: Normal breath sounds.  Abdominal:     General: Abdomen is flat. Bowel sounds are normal.     Palpations: Abdomen is soft.  Musculoskeletal:        General: Normal range of motion.     Cervical back: Normal range of motion and neck supple.  Skin:    General: Skin is warm.     Capillary Refill: Capillary refill takes less than 2 seconds.  Neurological:     General: No focal deficit present.     Mental Status: He is alert and oriented to person, place, and time.  Psychiatric:        Thought Content: Thought content includes suicidal ideation. Thought content includes suicidal plan.     ED Results / Procedures / Treatments   Labs (  all labs ordered are listed, but only abnormal results are displayed) Labs Reviewed  COMPREHENSIVE METABOLIC PANEL - Abnormal; Notable for the following components:      Result Value   Potassium 3.2 (*)    Calcium 8.8 (*)    All other components within normal limits  CBC WITH DIFFERENTIAL/PLATELET - Abnormal; Notable for the following components:   Hemoglobin 12.9 (*)    HCT 38.9 (*)    nRBC 0.3 (*)    All other components within normal limits  ETHANOL  RAPID URINE DRUG SCREEN, HOSP PERFORMED    EKG None  Radiology No results found.  Procedures Procedures    Medications Ordered in ED Medications - No data to display  ED Course/ Medical Decision Making/ A&P                                 Medical Decision Making Amount and/or Complexity of Data Reviewed Labs: ordered.   This patient presents to the ED for concern of suicidal ideation, this involves an extensive number of  treatment options, and is a complaint that carries with it a high risk of complications and morbidity.  The differential diagnosis includes si, depression, electrolyte abn   Co morbidities that complicate the patient evaluation  depression, anxiety, and homelessness   Additional history obtained:  Additional history obtained from epic chart review  Lab Tests:  I Ordered, and personally interpreted labs.  The pertinent results include:  cbc with hgb low at 12.9 (13 on 9/1), cmp nl other than k slightly low at 3.2, etoh neg  Medicines ordered and prescription drug management:   I have reviewed the patients home medicines and have made adjustments as needed  Critical Interventions:  TTS consult   Consultations Obtained:  I requested consultation with TTS,  and discussed lab and imaging findings as well as pertinent plan - they recommend: consult pending   Problem List / ED Course:  SI:  pt is medically clear.  TTS consult pending Hypokalemia:  mild.  Kdur given.  Bmp repeat tomorrow am   Reevaluation:  After the interventions noted above, I reevaluated the patient and found that they have :stayed the same   Social Determinants of Health:  homeless   Dispostion:  After consideration of the diagnostic results and the patients response to treatment, I feel that the patent would benefit from TTS consult.          Final Clinical Impression(s) / ED Diagnoses Final diagnoses:  Homelessness  Suicidal ideation    Rx / DC Orders ED Discharge Orders     None         Jacalyn Lefevre, MD 08/30/23 1658

## 2023-08-30 NOTE — BH Assessment (Signed)
TTS consult will be completed IRIS. IRIS coordinator will notify team of assessment time and provider in established secure chat.

## 2023-08-30 NOTE — Consult Note (Addendum)
[facility] Consult Note  Patient Name: Angel [name] MRN: [id number] DOB: 1981/04/15 DATE OF Consult: 08/30/2023  PRIMARY PSYCHIATRIC DIAGNOSES  1.  Suicidal ideations 2.  MDD 3.  Adjustment disorder with depressed mood.  RECOMMENDATIONS   Admit the patient to the inpatient psychiatric unit for safety and stabilization. Medication recommendations: Continue current medications as prescribed. Non-Medication/therapeutic recommendations: Refer to outpatient psychiatric provider for medication management and therapy upon discharge from inpatient psych admission.  Consult with a Child psychotherapist to address the patient's homelessness and identify potential support resources. Thank you for involving [place] in the care of this patient. If you have any additional questions or concerns, please call [phone] and ask for me or the provider on-call.  TELEPSYCHIATRY ATTESTATION & CONSENT  As the provider for this telehealth consult, I attest that I verified the patient's identity using two separate identifiers, introduced myself to the patient, provided my credentials, disclosed my location, and performed this encounter via a HIPAA-compliant, real-time, face-to-face, two-way, interactive audio and video platform and with the full consent and agreement of the patient (or guardian as applicable.)  Patient physical location: [facility]. Telehealth provider physical location: home office in state of [place].  Video start time: 2238 [facility] Time) Video end time: 2248 [facility] Time)  IDENTIFYING DATA  Angel [name] is a 42 y.o. year-old male for whom a psychiatric consultation has been ordered by the primary provider. The patient was identified using two separate identifiers.  CHIEF COMPLAINT/REASON FOR CONSULT  "I have been suicidal lately"  HISTORY OF PRESENT ILLNESS (HPI)  The patient is a 42 year old male who arrived for evaluation, expressing a chief complaint of suicidal ideation with plan  to jump off a bridge. Angel [name] reports a recent escalation in suicidal thoughts, attributing these feelings to his current homeless status and episodes of starvation. He has been without a home for the last four months, during which he has also faced significant food insecurity. Angel [name]'s background includes a history of depression, characterized by profound feelings of hopelessness and worthlessness. Despite these challenges, he denies experiencing any auditory or visual hallucinations. His sleep patterns are disrupted, with Angel [name] averaging only 5 hours of sleep nightly, and he reports having a poor appetite. Regarding his mental health history, Angel [name] has been admitted to a mental health hospital previously, with the most recent admission occurring one week ago. He reports that he is currently prescribed medication for his mental health concerns, but he indicates that he did not receive his medication upon discharge from his last hospital stay. Angel [name] has a family history of mental health issues, with two deceased relatives who suffered from mental health disorders, but unable to state diagnosis. He engages in smoking, consuming a pack and a half of cigarettes daily, and admits to occasional marijuana use. However, he denies any significant alcohol use. Despite having family, Angel [name] states they are unwilling to assist him, though he also acknowledges that he has never sought their help. During the evaluation, Angel [name] exhibited signs of itchiness. He expresses a willingness to be admitted to the hospital for treatment, indicating a readiness for intervention and support.   PAST PSYCHIATRIC HISTORY  Reports a history of multiple inpatient psych admissions with last admission about a week ago. Denies having outpatient psych services. He reports that he is currently prescribed medication for his mental health concerns, but he indicates that he did not receive his medication upon discharge from his last  hospital stay. He is unable to state what he was  prescribed. Reports history of attempting to jump off a bridge and hanging himself in the past. Per chart review, patient is prescribed Trazodone 100mg  at bedtime, Mirtazapine 15mg  at bedtime and Venlafaxine XR 150mg  daily.  PAST MEDICAL HISTORY  Past Medical History:  Diagnosis Date   Anxiety    Depression    History of admission to inpatient psychiatry department 05/03/2023   Migraine    Suicidal ideation 05/03/2023     HOME MEDICATIONS  Facility Ordered Medications  Medication   [COMPLETED] potassium chloride SA (KLOR-CON M) CR tablet 40 mEq   mirtazapine (REMERON) tablet 15 mg   nicotine (NICODERM CQ - dosed in mg/24 hours) patch 14 mg   traZODone (DESYREL) tablet 100 mg   [START ON 08/31/2023] venlafaxine XR (EFFEXOR-XR) 24 hr capsule 150 mg   PTA Medications  Medication Sig   nicotine (NICODERM CQ - DOSED IN MG/24 HOURS) 14 mg/24hr patch Place 1 patch (14 mg total) onto the skin daily.   mirtazapine (REMERON) 15 MG tablet Take 1 tablet (15 mg total) by mouth at bedtime.   traZODone (DESYREL) 100 MG tablet Take 1 tablet (100 mg total) by mouth at bedtime as needed for sleep.   venlafaxine XR (EFFEXOR-XR) 150 MG 24 hr capsule Take 1 capsule (150 mg total) by mouth daily with breakfast.     ALLERGIES  No Known Allergies  SOCIAL & SUBSTANCE USE HISTORY  Social History   Socioeconomic History   Marital status: Single    Spouse name: Not on file   Number of children: Not on file   Years of education: Not on file   Highest education level: Not on file  Occupational History   Not on file  Tobacco Use   Smoking status: Every Day    Current packs/day: 1.50    Average packs/day: 1.5 packs/day for 20.0 years (30.0 ttl pk-yrs)    Types: Cigarettes   Smokeless tobacco: Never  Vaping Use   Vaping status: Never Used  Substance and Sexual Activity   Alcohol use: No   Drug use: Not Currently    Comment: denies use in the last  month   Sexual activity: Not Currently    Birth control/protection: Condom  Other Topics Concern   Not on file  Social History Narrative   Not on file   Social Determinants of Health   Financial Resource Strain: Not on file  Food Insecurity: Food Insecurity Present (06/20/2023)   Hunger Vital Sign    Worried About Running Out of Food in the Last Year: Often true    Ran Out of Food in the Last Year: Often true  Transportation Needs: Unmet Transportation Needs (06/20/2023)   PRAPARE - Administrator, Civil Service (Medical): Yes    Lack of Transportation (Non-Medical): Yes  Physical Activity: Not on file  Stress: Not on file  Social Connections: Not on file   Social History   Tobacco Use  Smoking Status Every Day   Current packs/day: 1.50   Average packs/day: 1.5 packs/day for 20.0 years (30.0 ttl pk-yrs)   Types: Cigarettes  Smokeless Tobacco Never   Social History   Substance and Sexual Activity  Alcohol Use No   Social History   Substance and Sexual Activity  Drug Use Not Currently   Comment: denies use in the last month    Additional pertinent information   FAMILY HISTORY  Family History  Problem Relation Age of Onset   Cancer Mother  brain   Diabetes Brother    Hypertension Brother    Family Psychiatric History (if known):  reports his 2 aunts who are currently deceased had mental health illness but unable to state diagnosis.  MENTAL STATUS EXAM (MSE)  Presentation  General Appearance:  Disheveled  Eye Contact: Poor  Speech: Clear and Coherent  Speech Volume: Normal  Handedness: Right   Mood and Affect  Mood: Depressed; Hopeless; Worthless  Affect: Solicitor Processes: Coherent  Descriptions of Associations: Intact  Orientation: Full (Time, Place and Person)  Thought Content: Logical  History of Schizophrenia/Schizoaffective disorder: No  Duration of Psychotic Symptoms:No data  recorded Hallucinations:Hallucinations: None  Ideas of Reference: None  Suicidal Thoughts:Suicidal Thoughts: Yes, Active SI Active Intent and/or Plan: With Intent; With Plan  Homicidal Thoughts:Homicidal Thoughts: No   Sensorium  Memory: Immediate Fair; Remote Fair; Recent Fair  Judgment: Fair  Insight: Fair   Chartered certified accountant: Fair  Attention Span: Fair  Recall: [employer] of Knowledge: Fair  Language: Fair   Psychomotor Activity  Psychomotor Activity:Psychomotor Activity: Normal  Assets  Assets: Manufacturing systems engineer; Other (comment) (seeking help)   Sleep  Sleep:Sleep: Poor Number of Hours of Sleep: 5   VITALS  Blood pressure 115/84, pulse 93, temperature 99.2 F (37.3 C), temperature source Oral, resp. rate 18, weight 74.8 kg, SpO2 99%.  LABS  Admission on 08/30/2023  Component Date Value Ref Range Status   Sodium 08/30/2023 137  135 - 145 mmol/L Final   Potassium 08/30/2023 3.2 (L)  3.5 - 5.1 mmol/L Final   Chloride 08/30/2023 103  98 - 111 mmol/L Final   CO2 08/30/2023 24  22 - 32 mmol/L Final   Glucose, Bld 08/30/2023 90  70 - 99 mg/dL Final   Glucose reference range applies only to samples taken after fasting for at least 8 hours.   BUN 08/30/2023 14  6 - 20 mg/dL Final   Creatinine, Ser 08/30/2023 0.74  0.61 - 1.24 mg/dL Final   Calcium 16/08/9603 8.8 (L)  8.9 - 10.3 mg/dL Final   Total Protein 54/07/8118 7.1  6.5 - 8.1 g/dL Final   Albumin 14/78/2956 4.0  3.5 - 5.0 g/dL Final   AST 21/30/8657 17  15 - 41 U/L Final   ALT 08/30/2023 23  0 - 44 U/L Final   Alkaline Phosphatase 08/30/2023 73  38 - 126 U/L Final   Total Bilirubin 08/30/2023 0.3  0.3 - 1.2 mg/dL Final   GFR, Estimated 08/30/2023 >60  >60 mL/min Final   Comment: (NOTE) Calculated using the CKD-EPI Creatinine Equation (2021)    Anion gap 08/30/2023 10  5 - 15 Final   Performed at [facility], [address], [place], [place] [address]   Alcohol, Ethyl  (B) 08/30/2023 <10  <10 mg/dL Final   Comment: (NOTE) Lowest detectable limit for serum alcohol is 10 mg/dL.  For medical purposes only. Performed at [facility], [address], [place], [place] [address]    Opiates 08/30/2023 NONE DETECTED  NONE DETECTED Final   Cocaine 08/30/2023 NONE DETECTED  NONE DETECTED Final   Benzodiazepines 08/30/2023 NONE DETECTED  NONE DETECTED Final   Amphetamines 08/30/2023 NONE DETECTED  NONE DETECTED Final   Tetrahydrocannabinol 08/30/2023 NONE DETECTED  NONE DETECTED Final   Barbiturates 08/30/2023 NONE DETECTED  NONE DETECTED Final   Comment: (NOTE) DRUG SCREEN FOR MEDICAL PURPOSES ONLY.  IF CONFIRMATION IS NEEDED FOR ANY PURPOSE, NOTIFY LAB WITHIN 5 DAYS.  LOWEST DETECTABLE LIMITS FOR URINE DRUG SCREEN Drug Class                     Cutoff (ng/mL) Amphetamine and metabolites    1000 Barbiturate and metabolites    200 Benzodiazepine                 200 Opiates and metabolites        300 Cocaine and metabolites        300 THC                            50 Performed at [facility], [address] [place], [place] [address]    WBC 08/30/2023 7.3  4.0 - 10.5 K/uL Final   RBC 08/30/2023 4.35  4.22 - 5.81 MIL/uL Final   Hemoglobin 08/30/2023 12.9 (L)  13.0 - 17.0 g/dL Final   HCT 60/45/4098 38.9 (L)  39.0 - 52.0 % Final   MCV 08/30/2023 89.4  80.0 - 100.0 fL Final   MCH 08/30/2023 29.7  26.0 - 34.0 pg Final   MCHC 08/30/2023 33.2  30.0 - 36.0 g/dL Final   RDW 11/91/4782 14.2  11.5 - 15.5 % Final   Platelets 08/30/2023 282  150 - 400 K/uL Final   nRBC 08/30/2023 0.3 (H)  0.0 - 0.2 % Final   Neutrophils Relative % 08/30/2023 32  % Final   Neutro Abs 08/30/2023 2.3  1.7 - 7.7 K/uL Final   Lymphocytes Relative 08/30/2023 54  % Final   Lymphs Abs 08/30/2023 4.0  0.7 - 4.0 K/uL Final   Monocytes Relative 08/30/2023 10  % Final   Monocytes Absolute 08/30/2023 0.7  0.1 - 1.0 K/uL Final   Eosinophils Relative 08/30/2023 3  % Final   Eosinophils  Absolute 08/30/2023 0.2  0.0 - 0.5 K/uL Final   Basophils Relative 08/30/2023 1  % Final   Basophils Absolute 08/30/2023 0.1  0.0 - 0.1 K/uL Final   Immature Granulocytes 08/30/2023 0  % Final   Abs Immature Granulocytes 08/30/2023 0.01  0.00 - 0.07 K/uL Final   Performed at [facility], [address], [place], [place] [address]    PSYCHIATRIC REVIEW OF SYSTEMS (ROS)  - Patient expressed suicidal ideation with plan to jump off bridge, feeling hopeless, worthless, and depressed. - Denied auditory or visual hallucinations. - Cognitively aware of his location and personal identity. - Exhibited signs of distress related to his current life situation. - Reported being homeless for 4 months. - Agreed to hospital admission.  Additional findings:      Musculoskeletal: No abnormal movements observed      Gait & Station: Laying/Sitting      Pain Screening: Denies      Nutrition & Dental Concerns: Decrease in food intake and/or loss of appetite  RISK FORMULATION/ASSESSMENT  Is the patient experiencing any suicidal or homicidal ideations: Yes       Explain if yes: has plan to jump off bridge. Protective factors considered for safety management: access to appropriate clinical intervention.  Risk factors/concerns considered for safety management:  Prior attempt Depression Recent loss Hopelessness Male gender Unmarried  Is there a safety management plan with the patient and treatment team to minimize risk factors and promote protective factors: Yes           Explain: admit to psych Is crisis care placement or psychiatric hospitalization recommended: Yes     Based on my  current evaluation and risk assessment, patient is determined at this time to be at:  Moderate Risk  *RISK ASSESSMENT Risk assessment is a dynamic process; it is possible that this patient's condition, and risk level, may change. This should be re-evaluated and managed over time as appropriate. Please re-consult psychiatric  consult services if additional assistance is needed in terms of risk assessment and management. If your team decides to discharge this patient, please advise the patient how to best access emergency psychiatric services, or to call 911, if their condition worsens or they feel unsafe in any way.   Norval Morton, NP Telepsychiatry Consult Services

## 2023-08-30 NOTE — Consult Note (Incomplete)
[facility] Consult Note  Patient Name: Angel [name] MRN: [id number] DOB: 15-May-1981 DATE OF Consult: 08/30/2023  PRIMARY PSYCHIATRIC DIAGNOSES  1.  Suicidal ideations 2.  MDD 3.  Adjustment disorder with depressed mood.  RECOMMENDATIONS  {Recommendations:[id number]::"Medication recommendations: ***","Non-Medication/therapeutic recommendations: ***","Communication: Treatment team members (and family members if applicable) who were involved in treatment/care discussions and planning, and with whom we spoke or engaged with via secure text/chat, include the following: ***"}  Thank you for involving [place] in the care of this patient. If you have any additional questions or concerns, please call [phone] and ask for me or the provider on-call.  TELEPSYCHIATRY ATTESTATION & CONSENT  As the provider for this telehealth consult, I attest that I verified the patient's identity using two separate identifiers, introduced myself to the patient, provided my credentials, disclosed my location, and performed this encounter via a HIPAA-compliant, real-time, face-to-face, two-way, interactive audio and video platform and with the full consent and agreement of the patient (or guardian as applicable.)  Patient physical location: [facility]. Telehealth provider physical location: home office in state of [place].  Video start time: 2238 [facility] Time) Video end time: 2248 [facility] Time)  IDENTIFYING DATA  Angel [name] is a 42 y.o. year-old male for whom a psychiatric consultation has been ordered by the primary provider. The patient was identified using two separate identifiers.  CHIEF COMPLAINT/REASON FOR CONSULT  "I have been suicidal lately"  HISTORY OF PRESENT ILLNESS (HPI)  The patient is a 42 year old male who arrived for evaluation, expressing a chief complaint of suicidal ideation with plan to jump off a bridge. Angel [name] reports a recent escalation in suicidal thoughts, attributing these  feelings to his current homeless status and episodes of starvation. He has been without a home for the last four months, during which he has also faced significant food insecurity. Angel [name]'s background includes a history of depression, characterized by profound feelings of hopelessness and worthlessness. Despite these challenges, he denies experiencing any auditory or visual hallucinations. His sleep patterns are disrupted, with Angel [name] averaging only 5 hours of sleep nightly, and he reports having a poor appetite. Regarding his mental health history, Angel [name] has been admitted to a mental health hospital previously, with the most recent admission occurring one week ago. He reports that he is currently prescribed medication for his mental health concerns, but he indicates that he did not receive his medication upon discharge from his last hospital stay. Gurfateh has a family history of mental health issues, with two deceased relatives who suffered from mental health disorders, but unable to state diagnosis. He engages in smoking, consuming a pack and a half of cigarettes daily, and admits to occasional marijuana use. However, he denies any significant alcohol use. Despite having family, Angel [name] states they are unwilling to assist him, though he also acknowledges that he has never sought their help. During the evaluation, Angel [name] exhibited signs of itchiness. He expresses a willingness to be admitted to the hospital for treatment, indicating a readiness for intervention and support.   PAST PSYCHIATRIC HISTORY  Reports a history of multiple inpatient psych admissions with last admission about a week ago. Denies having outpatient psych services. He reports that he is currently prescribed medication for his mental health concerns, but he indicates that he did not receive his medication upon discharge from his last hospital stay. He is unable to state what he was prescribed. Reports history of attempting to jump off  a bridge and hanging himself in the past.  PAST MEDICAL HISTORY  Past Medical History:  Diagnosis Date  . Anxiety   . Depression   . History of admission to inpatient psychiatry department 05/03/2023  . Migraine   . Suicidal ideation 05/03/2023     HOME MEDICATIONS  Facility Ordered Medications  Medication  . [COMPLETED] potassium chloride SA (KLOR-CON M) CR tablet 40 mEq  . mirtazapine (REMERON) tablet 15 mg  . nicotine (NICODERM CQ - dosed in mg/24 hours) patch 14 mg  . traZODone (DESYREL) tablet 100 mg  . [START ON 08/31/2023] venlafaxine XR (EFFEXOR-XR) 24 hr capsule 150 mg   PTA Medications  Medication Sig  . nicotine (NICODERM CQ - DOSED IN MG/24 HOURS) 14 mg/24hr patch Place 1 patch (14 mg total) onto the skin daily.  . mirtazapine (REMERON) 15 MG tablet Take 1 tablet (15 mg total) by mouth at bedtime.  . traZODone (DESYREL) 100 MG tablet Take 1 tablet (100 mg total) by mouth at bedtime as needed for sleep.  Marland Kitchen venlafaxine XR (EFFEXOR-XR) 150 MG 24 hr capsule Take 1 capsule (150 mg total) by mouth daily with breakfast.     ALLERGIES  No Known Allergies  SOCIAL & SUBSTANCE USE HISTORY  Social History   Socioeconomic History  . Marital status: Single    Spouse name: Not on file  . Number of children: Not on file  . Years of education: Not on file  . Highest education level: Not on file  Occupational History  . Not on file  Tobacco Use  . Smoking status: Every Day    Current packs/day: 1.50    Average packs/day: 1.5 packs/day for 20.0 years (30.0 ttl pk-yrs)    Types: Cigarettes  . Smokeless tobacco: Never  Vaping Use  . Vaping status: Never Used  Substance and Sexual Activity  . Alcohol use: No  . Drug use: Not Currently    Comment: denies use in the last month  . Sexual activity: Not Currently    Birth control/protection: Condom  Other Topics Concern  . Not on file  Social History Narrative  . Not on file   Social Determinants of Health   Financial  Resource Strain: Not on file  Food Insecurity: Food Insecurity Present (06/20/2023)   Hunger Vital Sign   . Worried About Programme researcher, broadcasting/film/video in the Last Year: Often true   . Ran Out of Food in the Last Year: Often true  Transportation Needs: Unmet Transportation Needs (06/20/2023)   PRAPARE - Transportation   . Lack of Transportation (Medical): Yes   . Lack of Transportation (Non-Medical): Yes  Physical Activity: Not on file  Stress: Not on file  Social Connections: Not on file   Social History   Tobacco Use  Smoking Status Every Day  . Current packs/day: 1.50  . Average packs/day: 1.5 packs/day for 20.0 years (30.0 ttl pk-yrs)  . Types: Cigarettes  Smokeless Tobacco Never   Social History   Substance and Sexual Activity  Alcohol Use No   Social History   Substance and Sexual Activity  Drug Use Not Currently   Comment: denies use in the last month    Additional pertinent information   FAMILY HISTORY  Family History  Problem Relation Age of Onset  . Cancer Mother        brain  . Diabetes Brother   . Hypertension Brother    Family Psychiatric History (if known):  reports his 2 aunts who are currently deceased had mental health illness but unable to  state diagnosis.  MENTAL STATUS EXAM (MSE)  Presentation  General Appearance:  Disheveled  Eye Contact: Poor  Speech: Clear and Coherent  Speech Volume: Normal  Handedness: Right   Mood and Affect  Mood: Depressed; Hopeless; Worthless  Affect: Solicitor Processes: Coherent  Descriptions of Associations: Intact  Orientation: Full (Time, Place and Person)  Thought Content: Logical  History of Schizophrenia/Schizoaffective disorder: No  Duration of Psychotic Symptoms:No data recorded Hallucinations:Hallucinations: None  Ideas of Reference: None  Suicidal Thoughts:Suicidal Thoughts: Yes, Active SI Active Intent and/or Plan: With Intent; With Plan  Homicidal  Thoughts:Homicidal Thoughts: No   Sensorium  Memory: Immediate Fair; Remote Fair; Recent Fair  Judgment: Fair  Insight: Fair   Chartered certified accountant: Fair  Attention Span: Fair  Recall: [employer] of Knowledge: Fair  Language: Fair   Psychomotor Activity  Psychomotor Activity:Psychomotor Activity: Normal  Assets  Assets: Manufacturing systems engineer; Other (comment) (seeking help)   Sleep  Sleep:Sleep: Poor Number of Hours of Sleep: 5   VITALS  Blood pressure 115/84, pulse 93, temperature 99.2 F (37.3 C), temperature source Oral, resp. rate 18, weight 74.8 kg, SpO2 99%.  LABS  Admission on 08/30/2023  Component Date Value Ref Range Status  . Sodium 08/30/2023 137  135 - 145 mmol/L Final  . Potassium 08/30/2023 3.2 (L)  3.5 - 5.1 mmol/L Final  . Chloride 08/30/2023 103  98 - 111 mmol/L Final  . CO2 08/30/2023 24  22 - 32 mmol/L Final  . Glucose, Bld 08/30/2023 90  70 - 99 mg/dL Final   Glucose reference range applies only to samples taken after fasting for at least 8 hours.  . BUN 08/30/2023 14  6 - 20 mg/dL Final  . Creatinine, Ser 08/30/2023 0.74  0.61 - 1.24 mg/dL Final  . Calcium 40/98/1191 8.8 (L)  8.9 - 10.3 mg/dL Final  . Total Protein 08/30/2023 7.1  6.5 - 8.1 g/dL Final  . Albumin 47/82/9562 4.0  3.5 - 5.0 g/dL Final  . AST 13/06/6577 17  15 - 41 U/L Final  . ALT 08/30/2023 23  0 - 44 U/L Final  . Alkaline Phosphatase 08/30/2023 73  38 - 126 U/L Final  . Total Bilirubin 08/30/2023 0.3  0.3 - 1.2 mg/dL Final  . GFR, Estimated 08/30/2023 >60  >60 mL/min Final   Comment: (NOTE) Calculated using the CKD-EPI Creatinine Equation (2021)   . Anion gap 08/30/2023 10  5 - 15 Final   Performed at [facility], [address], [place], [place] [address]  . Alcohol, Ethyl (B) 08/30/2023 <10  <10 mg/dL Final   Comment: (NOTE) Lowest detectable limit for serum alcohol is 10 mg/dL.  For medical purposes only. Performed at [facility],  [address], [place], [place] [address]   . Opiates 08/30/2023 NONE DETECTED  NONE DETECTED Final  . Cocaine 08/30/2023 NONE DETECTED  NONE DETECTED Final  . Benzodiazepines 08/30/2023 NONE DETECTED  NONE DETECTED Final  . Amphetamines 08/30/2023 NONE DETECTED  NONE DETECTED Final  . Tetrahydrocannabinol 08/30/2023 NONE DETECTED  NONE DETECTED Final  . Barbiturates 08/30/2023 NONE DETECTED  NONE DETECTED Final   Comment: (NOTE) DRUG SCREEN FOR MEDICAL PURPOSES ONLY.  IF CONFIRMATION IS NEEDED FOR ANY PURPOSE, NOTIFY LAB WITHIN 5 DAYS.  LOWEST DETECTABLE LIMITS FOR URINE DRUG SCREEN Drug Class                     Cutoff (ng/mL) Amphetamine and metabolites  1000 Barbiturate and metabolites    200 Benzodiazepine                 200 Opiates and metabolites        300 Cocaine and metabolites        300 THC                            50 Performed at [facility], [address], [place], [place] [address]   . WBC 08/30/2023 7.3  4.0 - 10.5 K/uL Final  . RBC 08/30/2023 4.35  4.22 - 5.81 MIL/uL Final  . Hemoglobin 08/30/2023 12.9 (L)  13.0 - 17.0 g/dL Final  . HCT 60/45/4098 38.9 (L)  39.0 - 52.0 % Final  . MCV 08/30/2023 89.4  80.0 - 100.0 fL Final  . MCH 08/30/2023 29.7  26.0 - 34.0 pg Final  . MCHC 08/30/2023 33.2  30.0 - 36.0 g/dL Final  . RDW 11/91/4782 14.2  11.5 - 15.5 % Final  . Platelets 08/30/2023 282  150 - 400 K/uL Final  . nRBC 08/30/2023 0.3 (H)  0.0 - 0.2 % Final  . Neutrophils Relative % 08/30/2023 32  % Final  . Neutro Abs 08/30/2023 2.3  1.7 - 7.7 K/uL Final  . Lymphocytes Relative 08/30/2023 54  % Final  . Lymphs Abs 08/30/2023 4.0  0.7 - 4.0 K/uL Final  . Monocytes Relative 08/30/2023 10  % Final  . Monocytes Absolute 08/30/2023 0.7  0.1 - 1.0 K/uL Final  . Eosinophils Relative 08/30/2023 3  % Final  . Eosinophils Absolute 08/30/2023 0.2  0.0 - 0.5 K/uL Final  . Basophils Relative 08/30/2023 1  % Final  . Basophils Absolute 08/30/2023 0.1  0.0 - 0.1 K/uL Final  .  Immature Granulocytes 08/30/2023 0  % Final  . Abs Immature Granulocytes 08/30/2023 0.01  0.00 - 0.07 K/uL Final   Performed at [facility], [address] [place], [place] [address]    PSYCHIATRIC REVIEW OF SYSTEMS (ROS)  - Patient expressed suicidal ideation with plan to jump off bridge, feeling hopeless, worthless, and depressed. - Denied auditory or visual hallucinations. - Cognitively aware of his location and personal identity. - Exhibited signs of distress related to his current life situation. - Reported being homeless for 4 months. - Agreed to hospital admission.  Additional findings:      Musculoskeletal: No abnormal movements observed      Gait & Station: Laying/Sitting      Pain Screening: Denies      Nutrition & Dental Concerns: Decrease in food intake and/or loss of appetite  RISK FORMULATION/ASSESSMENT  Is the patient experiencing any suicidal or homicidal ideations: Yes       Explain if yes: has plan to jump off bridge. Protective factors considered for safety management: access to appropriate clinical intervention.  Risk factors/concerns considered for safety management:  Prior attempt Depression Recent loss Hopelessness Male gender Unmarried  Is there a safety management plan with the patient and treatment team to minimize risk factors and promote protective factors: Yes           Explain: admit to psych Is crisis care placement or psychiatric hospitalization recommended: Yes     Based on my current evaluation and risk assessment, patient is determined at this time to be at:  Moderate Risk  *RISK ASSESSMENT Risk assessment is a dynamic process; it is possible that this patient's condition, and risk level, may  change. This should be re-evaluated and managed over time as appropriate. Please re-consult psychiatric consult services if additional assistance is needed in terms of risk assessment and management. If your team decides to discharge this patient, please  advise the patient how to best access emergency psychiatric services, or to call 911, if their condition worsens or they feel unsafe in any way.   Norval Morton, NP Telepsychiatry Consult Services

## 2023-08-30 NOTE — ED Triage Notes (Signed)
C/o SI plan of jumping off bridge that started today when waking up. Pt reports his friend talked him out of jumping off bridge.  Denies visual/auditory hallucinations  Pt reports depressed for 2 days.   Recent IVC on 07/29/23.

## 2023-08-31 ENCOUNTER — Other Ambulatory Visit (HOSPITAL_COMMUNITY)
Admission: EM | Admit: 2023-08-31 | Discharge: 2023-09-04 | Payer: MEDICAID | Attending: Addiction Medicine | Admitting: Addiction Medicine

## 2023-08-31 DIAGNOSIS — R45851 Suicidal ideations: Secondary | ICD-10-CM | POA: Insufficient documentation

## 2023-08-31 DIAGNOSIS — Z87891 Personal history of nicotine dependence: Secondary | ICD-10-CM | POA: Insufficient documentation

## 2023-08-31 DIAGNOSIS — F419 Anxiety disorder, unspecified: Secondary | ICD-10-CM | POA: Insufficient documentation

## 2023-08-31 DIAGNOSIS — F332 Major depressive disorder, recurrent severe without psychotic features: Secondary | ICD-10-CM | POA: Diagnosis present

## 2023-08-31 DIAGNOSIS — Z79899 Other long term (current) drug therapy: Secondary | ICD-10-CM | POA: Insufficient documentation

## 2023-08-31 DIAGNOSIS — F4321 Adjustment disorder with depressed mood: Secondary | ICD-10-CM | POA: Insufficient documentation

## 2023-08-31 DIAGNOSIS — Z5986 Financial insecurity: Secondary | ICD-10-CM | POA: Insufficient documentation

## 2023-08-31 DIAGNOSIS — Z9151 Personal history of suicidal behavior: Secondary | ICD-10-CM | POA: Insufficient documentation

## 2023-08-31 DIAGNOSIS — Z91199 Patient's noncompliance with other medical treatment and regimen due to unspecified reason: Secondary | ICD-10-CM | POA: Insufficient documentation

## 2023-08-31 DIAGNOSIS — F411 Generalized anxiety disorder: Secondary | ICD-10-CM | POA: Insufficient documentation

## 2023-08-31 DIAGNOSIS — Z5900 Homelessness unspecified: Secondary | ICD-10-CM

## 2023-08-31 DIAGNOSIS — Z5901 Sheltered homelessness: Secondary | ICD-10-CM | POA: Insufficient documentation

## 2023-08-31 LAB — BASIC METABOLIC PANEL
Anion gap: 8 (ref 5–15)
BUN: 13 mg/dL (ref 6–20)
CO2: 24 mmol/L (ref 22–32)
Calcium: 8.3 mg/dL — ABNORMAL LOW (ref 8.9–10.3)
Chloride: 104 mmol/L (ref 98–111)
Creatinine, Ser: 0.79 mg/dL (ref 0.61–1.24)
GFR, Estimated: >60 mL/min (ref >60)
Glucose, Bld: 94 mg/dL (ref 70–99)
Potassium: 3.6 mmol/L (ref 3.5–5.1)
Sodium: 136 mmol/L (ref 135–145)

## 2023-08-31 MED ORDER — VENLAFAXINE HCL ER 150 MG PO CP24
150.0000 mg | ORAL_CAPSULE | Freq: Every day | ORAL | Status: DC
  Administered 2023-09-01 – 2023-09-04 (×4): 150 mg via ORAL
  Filled 2023-08-31 (×2): qty 1
  Filled 2023-08-31: qty 7
  Filled 2023-08-31 (×2): qty 1

## 2023-08-31 MED ORDER — HYDROXYZINE HCL 25 MG PO TABS
25.0000 mg | ORAL_TABLET | Freq: Three times a day (TID) | ORAL | Status: DC | PRN
  Administered 2023-09-01: 25 mg via ORAL
  Filled 2023-08-31: qty 1

## 2023-08-31 MED ORDER — MAGNESIUM HYDROXIDE 400 MG/5ML PO SUSP: 30.0000 mL | Freq: Every day | ORAL | Status: DC | PRN

## 2023-08-31 MED ORDER — ACETAMINOPHEN 325 MG PO TABS: 650.0000 mg | ORAL_TABLET | Freq: Four times a day (QID) | ORAL | Status: DC | PRN

## 2023-08-31 MED ORDER — TRAZODONE HCL 50 MG PO TABS
50.0000 mg | ORAL_TABLET | Freq: Every evening | ORAL | Status: DC | PRN
  Administered 2023-09-01 – 2023-09-03 (×3): 50 mg via ORAL
  Filled 2023-08-31: qty 1
  Filled 2023-08-31: qty 7
  Filled 2023-08-31 (×2): qty 1

## 2023-08-31 MED ORDER — HYDROXYZINE HCL 25 MG PO TABS
50.0000 mg | ORAL_TABLET | Freq: Once | ORAL | Status: AC
  Administered 2023-08-31: 50 mg via ORAL
  Filled 2023-08-31: qty 2

## 2023-08-31 MED ORDER — MIRTAZAPINE 15 MG PO TABS
15.0000 mg | ORAL_TABLET | Freq: Every day | ORAL | Status: DC
  Administered 2023-08-31 – 2023-09-03 (×4): 15 mg via ORAL
  Filled 2023-08-31 (×4): qty 1
  Filled 2023-08-31: qty 7

## 2023-08-31 MED ORDER — ALUM & MAG HYDROXIDE-SIMETH 200-200-20 MG/5ML PO SUSP: 30.0000 mL | ORAL | Status: DC | PRN

## 2023-08-31 NOTE — ED Notes (Addendum)
Patient in th bedroom sleeping. Respirations are even and unlabored.  Will continue to monitor for safety

## 2023-08-31 NOTE — Progress Notes (Signed)
Pt was transferred from [facility] ED and admitted to [facility] due to [facility] with no plan. Pt continues to endorse SI however he verbally contracts for safety on the unit. Pt is alert and oriented X3 with flat affect. Pt is ambulatory and is oriented to staff/unit. Pt was cooperative with admission process and skin assessment. Blister was noted under pt's left foot. Pt denies pain and current HI/AVH. 15 minutes safety checks initiated per order. Staff will monitor for pt's safety.

## 2023-08-31 NOTE — Group Note (Signed)
Group Topic: Communication  Group Date: 08/31/2023 Start Time: 2016 End Time: 2117 Facilitators: Rae Lips B  Department: [facility]  Number of Participants: 3  Group Focus: acceptance Treatment Modality:  Individual Therapy Interventions utilized were story telling and support Purpose: regain self-worth  Name: Angel [name] Date of Birth: 1981/05/06  MR: [id number]    Level of Participation: PT DID NOT ATTEND GROUP  Quality of Participation: restless Interactions with others: Was in the bed sleeping Mood/Affect: restless Triggers (if applicable): NA Cognition: not focused Progress: None Response: NA Plan: patient will be encouraged to go to groups.   Patients Problems:  Patient Active Problem List   Diagnosis Date Noted   Adjustment disorder with depressed mood 08/31/2023   MDD (major depressive disorder), recurrent episode, severe (HCC) 08/31/2023   Housing instability after recent homelessness 06/21/2023   Scabies 06/21/2023   MDD (major depressive disorder) 06/20/2023   Suicidal ideation 05/03/2023   GERD (gastroesophageal reflux disease) 03/11/2018   Tobacco use disorder 07/17/2015   Generalized anxiety disorder 11/01/2013   MDD (major depressive disorder), recurrent severe, without psychosis (HCC) 10/31/2013

## 2023-08-31 NOTE — ED Provider Notes (Signed)
Emergency Medicine Observation Re-evaluation Note  Angel [name] is a 42 y.o. male, seen on rounds today.  Pt initially presented to the ED for complaints of Suicidal Currently, the patient is sleeping. Patient has history of MDD, homelessness and he comes in with suicidal ideation with plans to jump off the bridge.  He was seen by psychiatry team and recommendations are: admit the patient to the inpatient psychiatric unit for safety and stabilization.   Physical Exam  BP 103/71 (BP Location: Right Arm)   Pulse 65   Temp 97.8 F (36.6 C) (Oral)   Resp 15   Wt 74.8 kg   SpO2 100%   BMI 25.09 kg/m  Physical Exam General: No acute distress Cardiac: Regular rate Lungs: No respiratory distress Psych: Currently calm  ED Course / MDM  EKG:   I have reviewed the labs performed to date as well as medications administered while in observation.  Recent changes in the last 24 hours include - none.  Plan  Current plan is for psychiatry hold for stabilization.    Derwood Kaplan, MD 08/31/23 [phone]

## 2023-08-31 NOTE — Consult Note (Signed)
Telepsych Consultation   Reason for Consult:  Psychiatric Consult Referring Physician:  Milagros Evener MD  Location of Patient: [facility] Location of Provider: Other: [facility]   Patient Identification: Angel [name] MRN:  [id number] Principal Diagnosis: <principal problem not specified> Diagnosis:  Active Problems:   Adjustment disorder with depressed mood   Total Time spent with patient: 45 minutes  Subjective:   Angel [name] is a 42 y.o. male patient admitted with suicidal ideations with an initial plan to jump into traffic. Patient is well known to the [facility] several lines (see chart review for patient high utilization of emergency services for shelter resources.  HPI:   On evaluation, Angel [name], tells this Clinical research associate, that he came to the hospital due to five days of worsening suicidal ideaitons.  He endorses on-going depression and suicidal ideations despite three back to back psychiatric admissions due to suicidal ideations within the last 4 months. He tells this Clinical research associate he was schedule for an appointment with [facility] remotely however, no one calls his phone. He was without psychiatric medications until returning back to the ED. [place] Prior psychiatric medications continued during this admission. Patient denies current substance use although has history of THC use. UDS negative this admission. Patient continues to endorse passive SI without a current plan but feels if discharged he may jump from a bridge. Discussed [facility] at [facility] transfer for stabilization and medication management. Patient was able to contract for safety and agreement with plan for transfer.   Past Psychiatric History: SEE HPI   Risk to Self:  Yes  Risk to Others:  No  Prior Inpatient Therapy:  04/28/2023, 05/23/2023, 06/20/2023 Prior Outpatient Therapy:  [facility] (in the past)   Past Medical History:  Past Medical History:  Diagnosis Date   Anxiety     Depression    History of admission to inpatient psychiatry department 05/03/2023   Migraine    Suicidal ideation 05/03/2023   History reviewed. No pertinent surgical history. Family History:  Family History  Problem Relation Age of Onset   Cancer Mother        brain   Diabetes Brother    Hypertension Brother    Family Psychiatric  History:  No pertinent family medical history provided  Social History:  Social History   Substance and Sexual Activity  Alcohol Use No     Social History   Substance and Sexual Activity  Drug Use Not Currently   Comment: denies use in the last month    Social History   Socioeconomic History   Marital status: Single    Spouse name: Not on file   Number of children: Not on file   Years of education: Not on file   Highest education level: Not on file  Occupational History   Not on file  Tobacco Use   Smoking status: Every Day    Current packs/day: 1.50    Average packs/day: 1.5 packs/day for 20.0 years (30.0 ttl pk-yrs)    Types: Cigarettes   Smokeless tobacco: Never  Vaping Use   Vaping status: Never Used  Substance and Sexual Activity   Alcohol use: No   Drug use: Not Currently    Comment: denies use in the last month   Sexual activity: Not Currently    Birth control/protection: Condom  Other Topics Concern   Not on file  Social History Narrative   Not on file   Social Determinants of  Health   Financial Resource Strain: Not on file  Food Insecurity: Food Insecurity Present (06/20/2023)   Hunger Vital Sign    Worried About Running Out of Food in the Last Year: Often true    Ran Out of Food in the Last Year: Often true  Transportation Needs: Unmet Transportation Needs (06/20/2023)   PRAPARE - Administrator, Civil Service (Medical): Yes    Lack of Transportation (Non-Medical): Yes  Physical Activity: Not on file  Stress: Not on file  Social Connections: Not on file   Additional Social History:    Allergies:   No Known Allergies  Labs:  Results for orders placed or performed during the hospital encounter of 08/30/23 (from the past 48 hour(s))  Comprehensive metabolic panel     Status: Abnormal   Collection Time: 08/30/23  4:13 PM  Result Value Ref Range   Sodium 137 135 - 145 mmol/L   Potassium 3.2 (L) 3.5 - 5.1 mmol/L   Chloride 103 98 - 111 mmol/L   CO2 24 22 - 32 mmol/L   Glucose, Bld 90 70 - 99 mg/dL    Comment: Glucose reference range applies only to samples taken after fasting for at least 8 hours.   BUN 14 6 - 20 mg/dL   Creatinine, Ser 1.61 0.61 - 1.24 mg/dL   Calcium 8.8 (L) 8.9 - 10.3 mg/dL   Total Protein 7.1 6.5 - 8.1 g/dL   Albumin 4.0 3.5 - 5.0 g/dL   AST 17 15 - 41 U/L   ALT 23 0 - 44 U/L   Alkaline Phosphatase 73 38 - 126 U/L   Total Bilirubin 0.3 0.3 - 1.2 mg/dL   GFR, Estimated >09 >60 mL/min    Comment: (NOTE) Calculated using the CKD-EPI Creatinine Equation (2021)    Anion gap 10 5 - 15    Comment: Performed at [facility], [address], [place], [place] [address]  Ethanol     Status: None   Collection Time: 08/30/23  4:13 PM  Result Value Ref Range   Alcohol, Ethyl (B) <10 <10 mg/dL    Comment: (NOTE) Lowest detectable limit for serum alcohol is 10 mg/dL.  For medical purposes only. Performed at [facility], [address] [place], [place] [address]   CBC with Diff     Status: Abnormal   Collection Time: 08/30/23  4:13 PM  Result Value Ref Range   WBC 7.3 4.0 - 10.5 K/uL   RBC 4.35 4.22 - 5.81 MIL/uL   Hemoglobin 12.9 (L) 13.0 - 17.0 g/dL   HCT 47.8 (L) 29.5 - 62.1 %   MCV 89.4 80.0 - 100.0 fL   MCH 29.7 26.0 - 34.0 pg   MCHC 33.2 30.0 - 36.0 g/dL   RDW 30.8 65.7 - 84.6 %   Platelets 282 150 - 400 K/uL   nRBC 0.3 (H) 0.0 - 0.2 %   Neutrophils Relative % 32 %   Neutro Abs 2.3 1.7 - 7.7 K/uL   Lymphocytes Relative 54 %   Lymphs Abs 4.0 0.7 - 4.0 K/uL   Monocytes Relative 10 %   Monocytes Absolute 0.7 0.1 - 1.0 K/uL   Eosinophils Relative 3 %    Eosinophils Absolute 0.2 0.0 - 0.5 K/uL   Basophils Relative 1 %   Basophils Absolute 0.1 0.0 - 0.1 K/uL   Immature Granulocytes 0 %   Abs Immature Granulocytes 0.01 0.00 - 0.07 K/uL    Comment: Performed at [facility],  [address] [place], [place] [address]  Urine rapid drug screen (hosp performed)     Status: None   Collection Time: 08/30/23  8:56 PM  Result Value Ref Range   Opiates NONE DETECTED NONE DETECTED   Cocaine NONE DETECTED NONE DETECTED   Benzodiazepines NONE DETECTED NONE DETECTED   Amphetamines NONE DETECTED NONE DETECTED   Tetrahydrocannabinol NONE DETECTED NONE DETECTED   Barbiturates NONE DETECTED NONE DETECTED    Comment: (NOTE) DRUG SCREEN FOR MEDICAL PURPOSES ONLY.  IF CONFIRMATION IS NEEDED FOR ANY PURPOSE, NOTIFY LAB WITHIN 5 DAYS.  LOWEST DETECTABLE LIMITS FOR URINE DRUG SCREEN Drug Class                     Cutoff (ng/mL) Amphetamine and metabolites    1000 Barbiturate and metabolites    200 Benzodiazepine                 200 Opiates and metabolites        300 Cocaine and metabolites        300 THC                            50 Performed at [facility], [address], [place], [place] [address]   Basic metabolic panel     Status: Abnormal   Collection Time: 08/31/23  5:30 AM  Result Value Ref Range   Sodium 136 135 - 145 mmol/L   Potassium 3.6 3.5 - 5.1 mmol/L   Chloride 104 98 - 111 mmol/L   CO2 24 22 - 32 mmol/L   Glucose, Bld 94 70 - 99 mg/dL    Comment: Glucose reference range applies only to samples taken after fasting for at least 8 hours.   BUN 13 6 - 20 mg/dL   Creatinine, Ser 2.95 0.61 - 1.24 mg/dL   Calcium 8.3 (L) 8.9 - 10.3 mg/dL   GFR, Estimated >62 >13 mL/min    Comment: (NOTE) Calculated using the CKD-EPI Creatinine Equation (2021)    Anion gap 8 5 - 15    Comment: Performed at [facility], [address] [place], [place] [address]    Medications:  Current Facility-Administered Medications  Medication Dose Route  Frequency Provider Last Rate Last Admin   mirtazapine (REMERON) tablet 15 mg  15 mg Oral QHS Jacalyn Lefevre, MD   15 mg at 08/30/23 2159   nicotine (NICODERM CQ - dosed in mg/24 hours) patch 14 mg  14 mg Transdermal Daily Jacalyn Lefevre, MD   14 mg at 08/31/23 8469   traZODone (DESYREL) tablet 100 mg  100 mg Oral QHS PRN Jacalyn Lefevre, MD       venlafaxine XR (EFFEXOR-XR) 24 hr capsule 150 mg  150 mg Oral Q breakfast Jacalyn Lefevre, MD   150 mg at 08/31/23 0803   Current Outpatient Medications  Medication Sig Dispense Refill   mirtazapine (REMERON) 15 MG tablet Take 1 tablet (15 mg total) by mouth at bedtime. 30 tablet 0   nicotine (NICODERM CQ - DOSED IN MG/24 HOURS) 14 mg/24hr patch Place 1 patch (14 mg total) onto the skin daily. 28 patch 0   traZODone (DESYREL) 100 MG tablet Take 1 tablet (100 mg total) by mouth at bedtime as needed for sleep. 30 tablet 0   venlafaxine XR (EFFEXOR-XR) 150 MG 24 hr capsule Take 1 capsule (150 mg total) by mouth daily with breakfast. 30 capsule 0    Musculoskeletal:  Strength & Muscle Tone: within normal limits Gait & Station: normal Patient leans: N/A          Psychiatric Specialty Exam:  Presentation  General Appearance:  Disheveled  Eye Contact: Poor  Speech: Clear and Coherent  Speech Volume: Normal  Handedness: Right   Mood and Affect  Mood: Depressed; Hopeless; Worthless  Affect: Solicitor Processes: Coherent  Descriptions of Associations:Intact  Orientation:Full (Time, Place and Person)  Thought Content:Logical  History of Schizophrenia/Schizoaffective disorder:No  Duration of Psychotic Symptoms:No data recorded Hallucinations:Hallucinations: None  Ideas of Reference:None  Suicidal Thoughts:Suicidal Thoughts: Yes, Active SI Active Intent and/or Plan: With Intent; With Plan  Homicidal Thoughts:Homicidal Thoughts: No   Sensorium  Memory: Immediate Fair; Remote Fair;  Recent Fair  Judgment: Fair  Insight: Fair   Chartered certified accountant: Fair  Attention Span: Fair  Recall: [employer] of Knowledge: Fair  Language: Fair   Psychomotor Activity  Psychomotor Activity: Psychomotor Activity: Normal   Assets  Assets: Manufacturing systems engineer; Other (comment) (seeking help)   Sleep  Sleep: Sleep: Poor Number of Hours of Sleep: 5    Physical Exam: Physical Exam Vitals reviewed.  Constitutional:      Appearance: Normal appearance.  HENT:     Head: Normocephalic and atraumatic.  Eyes:     Extraocular Movements: Extraocular movements intact.     Pupils: Pupils are equal, round, and reactive to light.  Cardiovascular:     Rate and Rhythm: Normal rate and regular rhythm.  Pulmonary:     Effort: Pulmonary effort is normal.     Breath sounds: Normal breath sounds.  Musculoskeletal:        General: Normal range of motion.     Cervical back: Normal range of motion and neck supple.  Neurological:     General: No focal deficit present.     Mental Status: He is alert.     Review of Systems  Psychiatric/Behavioral:  Positive for depression, hallucinations and suicidal ideas. Negative for substance abuse. The patient is nervous/anxious and has insomnia.    Blood pressure 103/71, pulse 65, temperature 97.8 F (36.6 C), temperature source Oral, resp. rate 15, weight 74.8 kg, SpO2 100%. Body mass index is 25.09 kg/m.  Treatment Plan Summary: Plan Patient will be transferred to facility based crisis at [facility] for medication management and crisis stabilization. Patient is able to contact for safety while admitted to the unit. Patient is in agreement with plan for today.Patient has been medically cleared for transfer.  Disposition: Patient does not meet criteria for psychiatric inpatient admission. However we benefit from medication stabilization and continuous assessment.  Will admit patient into continuous observation for  24 to 48 hours for medication stabilization.  Patient has a history of chronic recurrent suicidal ideations and unfortunately he is homeless therefore relies on emergency services overutilization for shelter.  We will restart psychiatric meds, refer to shelter resources and make efforts to get patient established with outpatient psychiatry at [name].  This service was provided via telemedicine using a 2-way, interactive audio and video technology.  Names of all persons participating in this telemedicine service and their role in this encounter.  Name: Rosalie Doctor  Role: Patient   Name: Joaquin Courts, FNP-C, PMHNP-BC  Role: Nurse Practitioner     Joaquin Courts, NP 08/31/2023 10:15 AM

## 2023-08-31 NOTE — ED Notes (Signed)
Pt admitted to Facility Based crisis here at [facility] [phone] Nurse is Rogers Sink for report. Safe transport contact at this time.

## 2023-08-31 NOTE — ED Provider Notes (Signed)
Facility Based Crisis Admission H&P  Date: 09/01/23 Patient Name: Angel [name] MRN: [id number] Chief Complaint: "I'm still depressed and having suicidal thoughts"  Diagnoses:  Final diagnoses:  None    HPI:  Angel [name] is a 42 y.o. male patient transferred from [facility], admitted to [facility] due to worsening depression and now passive suicidal ideations now although previously had a plan of  jumping into traffic. Patient is well known to the [facility] several lines (see chart review for patient high utilization of emergency services for shelter resources.    Rosalie Doctor, tells this Clinical research associate, that he came to the hospital due to five days of worsening suicidal ideaitons.  He endorses on-going depression and suicidal ideations despite three back to back psychiatric admissions due to suicidal ideations within the last 4 months. He tells this Clinical research associate he was schedule for an appointment with [facility] remotely however, no one calls his phone. He was without psychiatric medications until returning back to the ED. [place] Prior psychiatric medications continued during this admission. Patient denies current substance use although has history of THC use. UDS negative this admission. Patient continues to endorse passive SI without a current plan but feels if discharged he may harm himself. Patient was agreeable to admission to  [facility] at [facility]. Patient was able to contract for safety and agreed to follow through with treatment recommendations. PHQ 2-9:   Flowsheet Row ED from 08/31/2023 in [facility] ED from 08/30/2023 in [facility] Emergency Department at [facility] ED from 07/29/2023 in [facility] Emergency Department at [facility]  C-SSRS RISK CATEGORY Low Risk High Risk High Risk         Total Time spent with patient: 30 minutes  Musculoskeletal  Strength & Muscle Tone: within normal limits Gait & Station: normal Patient leans:  N/A  Psychiatric Specialty Exam  Presentation General Appearance:  Disheveled  Eye Contact: Fair  Speech: Clear and Coherent  Speech Volume: Normal  Handedness: Right   Mood and Affect  Mood: Depressed  Affect: Congruent   Thought Process  Thought Processes: Coherent  Descriptions of Associations:Intact  Orientation:Full (Time, Place and Person)  Thought Content:Logical  Diagnosis of Schizophrenia or Schizoaffective disorder in past: No   Hallucinations:Hallucinations: None  Ideas of Reference:None  Suicidal Thoughts:Suicidal Thoughts: Yes, Passive SI Passive Intent and/or Plan: Without Intent  Homicidal Thoughts:Homicidal Thoughts: No   Sensorium  Memory: Immediate Good; Recent Good  Judgment: Fair  Insight: Good   Executive Functions  Concentration: Fair  Attention Span: Fair  Recall: Fair  Fund of Knowledge: Fair  Language: Fair   Psychomotor Activity  Psychomotor Activity: No data recorded   Assets  Assets: Communication Skills   Sleep  Sleep: Sleep: Good Number of Hours of Sleep: 5   Nutritional Assessment (For OBS and FBC admissions only) Has the patient had a weight loss or gain of 10 pounds or more in the last 3 months?: No Has the patient had a decrease in food intake/or appetite?: No Does the patient have dental problems?: No Does the patient have eating habits or behaviors that may be indicators of an eating disorder including binging or inducing vomiting?: No Has the patient recently lost weight without trying?: 0 Has the patient been eating poorly because of a decreased appetite?: 0 Malnutrition Screening Tool Score: 0    Physical Exam Vitals reviewed.  Constitutional:      Appearance: Normal appearance.  HENT:     Head: Normocephalic.     Comments: Disheveled  Eyes:     Extraocular Movements: Extraocular movements intact.     Pupils: Pupils are equal, round, and reactive to light.   Cardiovascular:     Rate and Rhythm: Normal rate and regular rhythm.  Pulmonary:     Effort: Pulmonary effort is normal.     Breath sounds: Normal breath sounds.  Musculoskeletal:        General: Normal range of motion.     Cervical back: Normal range of motion and neck supple.  Neurological:     General: No focal deficit present.     Mental Status: He is alert.    Review of Systems  Psychiatric/Behavioral:  Positive for depression and suicidal ideas. The patient is nervous/anxious and has insomnia.     Blood pressure 115/75, pulse 75, temperature 98.7 F (37.1 C), temperature source Oral, resp. rate 20, SpO2 100%. There is no height or weight on file to calculate BMI.  Past Psychiatric History: See HPI  Is the patient at risk to self? Yes  Has the patient been a risk to self in the past 6 months? Yes .    Has the patient been a risk to self within the distant past? Yes   Is the patient a risk to others? No   Has the patient been a risk to others in the past 6 months? No   Has the patient been a risk to others within the distant past? No   Past Medical History:  Past Medical History:  Diagnosis Date   Anxiety    Depression    History of admission to inpatient psychiatry department 05/03/2023   Migraine    Suicidal ideation 05/03/2023    Family History:  Family History  Problem Relation Age of Onset   Cancer Mother        brain   Diabetes Brother    Hypertension Brother      Social History:  Social Connections: Not on file    Last Labs:  Admission on 08/30/2023, Discharged on 08/31/2023  Component Date Value Ref Range Status   Sodium 08/30/2023 137  135 - 145 mmol/L Final   Potassium 08/30/2023 3.2 (L)  3.5 - 5.1 mmol/L Final   Chloride 08/30/2023 103  98 - 111 mmol/L Final   CO2 08/30/2023 24  22 - 32 mmol/L Final   Glucose, Bld 08/30/2023 90  70 - 99 mg/dL Final   Glucose reference range applies only to samples taken after fasting for at least 8 hours.    BUN 08/30/2023 14  6 - 20 mg/dL Final   Creatinine, Ser 08/30/2023 0.74  0.61 - 1.24 mg/dL Final   Calcium 40/98/1191 8.8 (L)  8.9 - 10.3 mg/dL Final   Total Protein 47/82/9562 7.1  6.5 - 8.1 g/dL Final   Albumin 13/06/6577 4.0  3.5 - 5.0 g/dL Final   AST 46/96/2952 17  15 - 41 U/L Final   ALT 08/30/2023 23  0 - 44 U/L Final   Alkaline Phosphatase 08/30/2023 73  38 - 126 U/L Final   Total Bilirubin 08/30/2023 0.3  0.3 - 1.2 mg/dL Final   GFR, Estimated 08/30/2023 >60  >60 mL/min Final   Comment: (NOTE) Calculated using the CKD-EPI Creatinine Equation (2021)    Anion gap 08/30/2023 10  5 - 15 Final   Performed at [facility], [address], [place], [place] [address]   Alcohol, Ethyl (B) 08/30/2023 <10  <  10 mg/dL Final   Comment: (NOTE) Lowest detectable limit for serum alcohol is 10 mg/dL.  For medical purposes only. Performed at [facility], [address], [place], [place] [address]    Opiates 08/30/2023 NONE DETECTED  NONE DETECTED Final   Cocaine 08/30/2023 NONE DETECTED  NONE DETECTED Final   Benzodiazepines 08/30/2023 NONE DETECTED  NONE DETECTED Final   Amphetamines 08/30/2023 NONE DETECTED  NONE DETECTED Final   Tetrahydrocannabinol 08/30/2023 NONE DETECTED  NONE DETECTED Final   Barbiturates 08/30/2023 NONE DETECTED  NONE DETECTED Final   Comment: (NOTE) DRUG SCREEN FOR MEDICAL PURPOSES ONLY.  IF CONFIRMATION IS NEEDED FOR ANY PURPOSE, NOTIFY LAB WITHIN 5 DAYS.  LOWEST DETECTABLE LIMITS FOR URINE DRUG SCREEN Drug Class                     Cutoff (ng/mL) Amphetamine and metabolites    1000 Barbiturate and metabolites    200 Benzodiazepine                 200 Opiates and metabolites        300 Cocaine and metabolites        300 THC                            50 Performed at [facility], [address] [place], [place] [address]    WBC 08/30/2023 7.3  4.0 - 10.5 K/uL Final   RBC 08/30/2023 4.35  4.22 - 5.81 MIL/uL Final   Hemoglobin 08/30/2023 12.9 (L)  13.0  - 17.0 g/dL Final   HCT 09/81/1914 38.9 (L)  39.0 - 52.0 % Final   MCV 08/30/2023 89.4  80.0 - 100.0 fL Final   MCH 08/30/2023 29.7  26.0 - 34.0 pg Final   MCHC 08/30/2023 33.2  30.0 - 36.0 g/dL Final   RDW 78/29/5621 14.2  11.5 - 15.5 % Final   Platelets 08/30/2023 282  150 - 400 K/uL Final   nRBC 08/30/2023 0.3 (H)  0.0 - 0.2 % Final   Neutrophils Relative % 08/30/2023 32  % Final   Neutro Abs 08/30/2023 2.3  1.7 - 7.7 K/uL Final   Lymphocytes Relative 08/30/2023 54  % Final   Lymphs Abs 08/30/2023 4.0  0.7 - 4.0 K/uL Final   Monocytes Relative 08/30/2023 10  % Final   Monocytes Absolute 08/30/2023 0.7  0.1 - 1.0 K/uL Final   Eosinophils Relative 08/30/2023 3  % Final   Eosinophils Absolute 08/30/2023 0.2  0.0 - 0.5 K/uL Final   Basophils Relative 08/30/2023 1  % Final   Basophils Absolute 08/30/2023 0.1  0.0 - 0.1 K/uL Final   Immature Granulocytes 08/30/2023 0  % Final   Abs Immature Granulocytes 08/30/2023 0.01  0.00 - 0.07 K/uL Final   Performed at [facility], [address] [place] [address], [place], [place] [address]   Sodium 08/31/2023 136  135 - 145 mmol/L Final   Potassium 08/31/2023 3.6  3.5 - 5.1 mmol/L Final   Chloride 08/31/2023 104  98 - 111 mmol/L Final   CO2 08/31/2023 24  22 - 32 mmol/L Final   Glucose, Bld 08/31/2023 94  70 - 99 mg/dL Final   Glucose reference range applies only to samples taken after fasting for at least 8 hours.   BUN 08/31/2023 13  6 - 20 mg/dL Final   Creatinine, Ser 08/31/2023 0.79  0.61 - 1.24 mg/dL Final   Calcium 78/46/9629 8.3 (L)  8.9 - 10.3 mg/dL Final   GFR, Estimated 08/31/2023 >60  >60 mL/min Final   Comment: (NOTE) Calculated using the CKD-EPI Creatinine Equation (2021)    Anion gap 08/31/2023 8  5 - 15 Final   Performed at [facility], [address], [place], [place] [address]  Admission on 07/29/2023, Discharged on 07/30/2023  Component Date Value Ref Range Status   Sodium 07/29/2023 135  135 - 145 mmol/L Final   Potassium 07/29/2023 3.3  (L)  3.5 - 5.1 mmol/L Final   Chloride 07/29/2023 102  98 - 111 mmol/L Final   CO2 07/29/2023 24  22 - 32 mmol/L Final   Glucose, Bld 07/29/2023 97  70 - 99 mg/dL Final   Glucose reference range applies only to samples taken after fasting for at least 8 hours.   BUN 07/29/2023 14  6 - 20 mg/dL Final   Creatinine, Ser 07/29/2023 0.75  0.61 - 1.24 mg/dL Final   Calcium 19/14/7829 8.6 (L)  8.9 - 10.3 mg/dL Final   Total Protein 56/21/3086 6.8  6.5 - 8.1 g/dL Final   Albumin 57/84/6962 3.9  3.5 - 5.0 g/dL Final   AST 95/28/4132 21  15 - 41 U/L Final   ALT 07/29/2023 24  0 - 44 U/L Final   Alkaline Phosphatase 07/29/2023 82  38 - 126 U/L Final   Total Bilirubin 07/29/2023 0.5  0.3 - 1.2 mg/dL Final   GFR, Estimated 07/29/2023 >60  >60 mL/min Final   Comment: (NOTE) Calculated using the CKD-EPI Creatinine Equation (2021)    Anion gap 07/29/2023 9  5 - 15 Final   Performed at [facility], [address] [place], [place] [address]   Alcohol, Ethyl (B) 07/29/2023 <10  <10 mg/dL Final   Comment: (NOTE) Lowest detectable limit for serum alcohol is 10 mg/dL.  For medical purposes only. Performed at [facility], [address] [place], [place] [address]    Salicylate Lvl 07/29/2023 <7.0 (L)  7.0 - 30.0 mg/dL Final   Performed at [facility], [address], [place], [place] [address]   Acetaminophen (Tylenol), Serum 07/29/2023 <10 (L)  10 - 30 ug/mL Final   Comment: (NOTE) Therapeutic concentrations vary significantly. A range of 10-30 ug/mL  may be an effective concentration for many patients. However, some  are best treated at concentrations outside of this range. Acetaminophen concentrations >150 ug/mL at 4 hours after ingestion  and >50 ug/mL at 12 hours after ingestion are often associated with  toxic reactions.  Performed at [facility], [address], [place], [place] [address]    WBC 07/29/2023 10.2  4.0 - 10.5 K/uL Final   RBC 07/29/2023 4.53  4.22 - 5.81 MIL/uL Final    Hemoglobin 07/29/2023 13.0  13.0 - 17.0 g/dL Final   HCT 59/56/3875 39.1  39.0 - 52.0 % Final   MCV 07/29/2023 86.3  80.0 - 100.0 fL Final   MCH 07/29/2023 28.7  26.0 - 34.0 pg Final   MCHC 07/29/2023 33.2  30.0 - 36.0 g/dL Final   RDW 64/33/2951 13.8  11.5 - 15.5 % Final   Platelets 07/29/2023 261  150 - 400 K/uL Final   nRBC 07/29/2023 0.0  0.0 - 0.2 % Final   Performed at [facility], [address], [place], [place] [address]   Opiates 07/29/2023 NONE DETECTED  NONE DETECTED Final   Cocaine 07/29/2023 NONE DETECTED  NONE DETECTED Final   Benzodiazepines 07/29/2023 NONE DETECTED  NONE DETECTED Final   Amphetamines  07/29/2023 NONE DETECTED  NONE DETECTED Final   Tetrahydrocannabinol 07/29/2023 NONE DETECTED  NONE DETECTED Final   Barbiturates 07/29/2023 NONE DETECTED  NONE DETECTED Final   Comment: (NOTE) DRUG SCREEN FOR MEDICAL PURPOSES ONLY.  IF CONFIRMATION IS NEEDED FOR ANY PURPOSE, NOTIFY LAB WITHIN 5 DAYS.  LOWEST DETECTABLE LIMITS FOR URINE DRUG SCREEN Drug Class                     Cutoff (ng/mL) Amphetamine and metabolites    1000 Barbiturate and metabolites    200 Benzodiazepine                 200 Opiates and metabolites        300 Cocaine and metabolites        300 THC                            50 Performed at [facility], [address] [place], [place] [address]   Admission on 06/20/2023, Discharged on 07/04/2023  Component Date Value Ref Range Status   HIV Screen 4th Generation wRfx 06/22/2023 Non Reactive  Non Reactive Final   Performed at [facility], [address], [place], [place] [address]   RPR Ser Ql 06/22/2023 NON REACTIVE  NON REACTIVE Final   Performed at [facility], [address], [place], [place] [address]   TSH 06/29/2023 2.636  0.350 - 4.500 uIU/mL Final   Comment: Performed by a 3rd Generation assay with a functional sensitivity of <=0.01 uIU/mL. Performed at [facility], [address], [place],  [place] [address]   Admission on 06/20/2023, Discharged on 06/20/2023  Component Date Value Ref Range Status   Sodium 06/20/2023 135  135 - 145 mmol/L Final   Potassium 06/20/2023 3.8  3.5 - 5.1 mmol/L Final   Chloride 06/20/2023 103  98 - 111 mmol/L Final   CO2 06/20/2023 25  22 - 32 mmol/L Final   Glucose, Bld 06/20/2023 97  70 - 99 mg/dL Final   Glucose reference range applies only to samples taken after fasting for at least 8 hours.   BUN 06/20/2023 13  6 - 20 mg/dL Final   Creatinine, Ser 06/20/2023 0.68  0.61 - 1.24 mg/dL Final   Calcium 29/52/8413 9.0  8.9 - 10.3 mg/dL Final   Total Protein 24/40/1027 7.6  6.5 - 8.1 g/dL Final   Albumin 25/36/6440 4.2  3.5 - 5.0 g/dL Final   AST 34/74/2595 40  15 - 41 U/L Final   ALT 06/20/2023 98 (H)  0 - 44 U/L Final   Alkaline Phosphatase 06/20/2023 124  38 - 126 U/L Final   Total Bilirubin 06/20/2023 0.6  0.3 - 1.2 mg/dL Final   GFR, Estimated 06/20/2023 >60  >60 mL/min Final   Comment: (NOTE) Calculated using the CKD-EPI Creatinine Equation (2021)    Anion gap 06/20/2023 7  5 - 15 Final   Performed at [facility], [address], [place], [place] [address]   Alcohol, Ethyl (B) 06/20/2023 <10  <10 mg/dL Final   Comment: (NOTE) Lowest detectable limit for serum alcohol is 10 mg/dL.  For medical purposes only. Performed at [facility], [address] [place], [place] [address]    Salicylate Lvl 06/20/2023 <7.0 (L)  7.0 - 30.0 mg/dL Final   Performed at [facility], [address] [place], [place] [address]   Acetaminophen (Tylenol), Serum 06/20/2023 <10 (  L)  10 - 30 ug/mL Final   Comment: (NOTE) Therapeutic concentrations vary significantly. A range of 10-30 ug/mL  may be an effective concentration for many patients. However, some  are best treated at concentrations outside of this range. Acetaminophen concentrations >150 ug/mL at 4 hours after ingestion  and >50 ug/mL at 12 hours after ingestion are often associated with  toxic  reactions.  Performed at [facility], [address], [place], [place] [address]    WBC 06/20/2023 8.2  4.0 - 10.5 K/uL Final   RBC 06/20/2023 4.73  4.22 - 5.81 MIL/uL Final   Hemoglobin 06/20/2023 13.9  13.0 - 17.0 g/dL Final   HCT 21/30/8657 41.3  39.0 - 52.0 % Final   MCV 06/20/2023 87.3  80.0 - 100.0 fL Final   MCH 06/20/2023 29.4  26.0 - 34.0 pg Final   MCHC 06/20/2023 33.7  30.0 - 36.0 g/dL Final   RDW 84/69/6295 13.8  11.5 - 15.5 % Final   Platelets 06/20/2023 256  150 - 400 K/uL Final   nRBC 06/20/2023 0.0  0.0 - 0.2 % Final   Performed at [facility], [address], [place], [place] [address]   Opiates 06/20/2023 NONE DETECTED  NONE DETECTED Final   Cocaine 06/20/2023 NONE DETECTED  NONE DETECTED Final   Benzodiazepines 06/20/2023 NONE DETECTED  NONE DETECTED Final   Amphetamines 06/20/2023 NONE DETECTED  NONE DETECTED Final   Tetrahydrocannabinol 06/20/2023 NONE DETECTED  NONE DETECTED Final   Barbiturates 06/20/2023 NONE DETECTED  NONE DETECTED Final   Comment: (NOTE) DRUG SCREEN FOR MEDICAL PURPOSES ONLY.  IF CONFIRMATION IS NEEDED FOR ANY PURPOSE, NOTIFY LAB WITHIN 5 DAYS.  LOWEST DETECTABLE LIMITS FOR URINE DRUG SCREEN Drug Class                     Cutoff (ng/mL) Amphetamine and metabolites    1000 Barbiturate and metabolites    200 Benzodiazepine                 200 Opiates and metabolites        300 Cocaine and metabolites        300 THC                            50 Performed at [facility], [address], [place], [place] [address]   Admission on 06/03/2023, Discharged on 06/04/2023  Component Date Value Ref Range Status   Sodium 06/03/2023 134 (L)  135 - 145 mmol/L Final   Potassium 06/03/2023 3.7  3.5 - 5.1 mmol/L Final   Chloride 06/03/2023 98  98 - 111 mmol/L Final   CO2 06/03/2023 21 (L)  22 - 32 mmol/L Final   Glucose, Bld 06/03/2023 79  70 - 99 mg/dL Final   Glucose reference range applies only to samples taken after fasting for at least 8  hours.   BUN 06/03/2023 18  6 - 20 mg/dL Final   Creatinine, Ser 06/03/2023 0.76  0.61 - 1.24 mg/dL Final   Calcium 02/72/5366 9.1  8.9 - 10.3 mg/dL Final   Total Protein 44/01/4741 7.3  6.5 - 8.1 g/dL Final   Albumin 59/56/3875 4.4  3.5 - 5.0 g/dL Final   AST 64/33/2951 61 (H)  15 - 41 U/L Final   ALT 06/03/2023 141 (H)  0 - 44 U/L Final   Alkaline Phosphatase 06/03/2023 79  38 - 126 U/L Final   Total Bilirubin  06/03/2023 1.1  0.3 - 1.2 mg/dL Final   GFR, Estimated 06/03/2023 >60  >60 mL/min Final   Comment: (NOTE) Calculated using the CKD-EPI Creatinine Equation (2021)    Anion gap 06/03/2023 15  5 - 15 Final   Performed at [facility], [address] [place], [place] [address]   Alcohol, Ethyl (B) 06/03/2023 <10  <10 mg/dL Final   Comment: (NOTE) Lowest detectable limit for serum alcohol is 10 mg/dL.  For medical purposes only. Performed at [facility], [address], [place], [place] [address]    WBC 06/03/2023 8.9  4.0 - 10.5 K/uL Final   RBC 06/03/2023 4.80  4.22 - 5.81 MIL/uL Final   Hemoglobin 06/03/2023 14.2  13.0 - 17.0 g/dL Final   HCT 09/81/1914 42.2  39.0 - 52.0 % Final   MCV 06/03/2023 87.9  80.0 - 100.0 fL Final   MCH 06/03/2023 29.6  26.0 - 34.0 pg Final   MCHC 06/03/2023 33.6  30.0 - 36.0 g/dL Final   RDW 78/29/5621 13.6  11.5 - 15.5 % Final   Platelets 06/03/2023 225  150 - 400 K/uL Final   nRBC 06/03/2023 0.0  0.0 - 0.2 % Final   Neutrophils Relative % 06/03/2023 62  % Final   Neutro Abs 06/03/2023 5.6  1.7 - 7.7 K/uL Final   Lymphocytes Relative 06/03/2023 29  % Final   Lymphs Abs 06/03/2023 2.5  0.7 - 4.0 K/uL Final   Monocytes Relative 06/03/2023 8  % Final   Monocytes Absolute 06/03/2023 0.7  0.1 - 1.0 K/uL Final   Eosinophils Relative 06/03/2023 1  % Final   Eosinophils Absolute 06/03/2023 0.0  0.0 - 0.5 K/uL Final   Basophils Relative 06/03/2023 0  % Final   Basophils Absolute 06/03/2023 0.0  0.0 - 0.1 K/uL Final   Immature Granulocytes 06/03/2023  0  % Final   Abs Immature Granulocytes 06/03/2023 0.03  0.00 - 0.07 K/uL Final   Performed at [facility], [address] [place], [place] [address]   SARS Coronavirus 2 by RT PCR 06/03/2023 NEGATIVE  NEGATIVE Final   Comment: (NOTE) SARS-CoV-2 target nucleic acids are NOT DETECTED.  The SARS-CoV-2 RNA is generally detectable in upper and lower respiratory specimens during the acute phase of infection. The lowest concentration of SARS-CoV-2 viral copies this assay can detect is 250 copies / mL. A negative result does not preclude SARS-CoV-2 infection and should not be used as the sole basis for treatment or other patient management decisions.  A negative result may occur with improper specimen collection / handling, submission of specimen other than nasopharyngeal swab, presence of viral mutation(s) within the areas targeted by this assay, and inadequate number of viral copies (<250 copies / mL). A negative result must be combined with clinical observations, patient history, and epidemiological information.  Fact Sheet for Patients:   RoadLapTop.co.za  Fact Sheet for Healthcare Providers: http://kim-miller.com/  This test is not yet approved or                           cleared by the [place] FDA and has been authorized for detection and/or diagnosis of SARS-CoV-2 by FDA under an Emergency Use Authorization (EUA).  This EUA will remain in effect (meaning this test can be used) for the duration of the COVID-19 declaration under Section 564(b)(1) of the Act, 21 U.S.C. section 360bbb-3(b)(1), unless the authorization is terminated or revoked sooner.  Performed at [facility]  [facility], [address] [place], [place] [address]    Acetaminophen (Tylenol), Serum 06/03/2023 <10 (L)  10 - 30 ug/mL Final   Comment: (NOTE) Therapeutic concentrations vary significantly. A range of 10-30 ug/mL  may be an effective concentration for many patients.  However, some  are best treated at concentrations outside of this range. Acetaminophen concentrations >150 ug/mL at 4 hours after ingestion  and >50 ug/mL at 12 hours after ingestion are often associated with  toxic reactions.  Performed at [facility], [address], [place], [place] [address]   Admission on 05/23/2023, Discharged on 05/23/2023  Component Date Value Ref Range Status   Sodium 05/23/2023 137  135 - 145 mmol/L Final   Potassium 05/23/2023 3.5  3.5 - 5.1 mmol/L Final   Chloride 05/23/2023 105  98 - 111 mmol/L Final   CO2 05/23/2023 23  22 - 32 mmol/L Final   Glucose, Bld 05/23/2023 111 (H)  70 - 99 mg/dL Final   Glucose reference range applies only to samples taken after fasting for at least 8 hours.   BUN 05/23/2023 12  6 - 20 mg/dL Final   Creatinine, Ser 05/23/2023 0.82  0.61 - 1.24 mg/dL Final   Calcium 09/81/1914 8.9  8.9 - 10.3 mg/dL Final   Total Protein 78/29/5621 6.8  6.5 - 8.1 g/dL Final   Albumin 30/86/5784 4.0  3.5 - 5.0 g/dL Final   AST 69/62/9528 32  15 - 41 U/L Final   ALT 05/23/2023 45 (H)  0 - 44 U/L Final   Alkaline Phosphatase 05/23/2023 58  38 - 126 U/L Final   Total Bilirubin 05/23/2023 0.6  0.3 - 1.2 mg/dL Final   GFR, Estimated 05/23/2023 >60  >60 mL/min Final   Comment: (NOTE) Calculated using the CKD-EPI Creatinine Equation (2021)    Anion gap 05/23/2023 9  5 - 15 Final   Performed at [facility], [address], [place], [place] [address]   Alcohol, Ethyl (B) 05/23/2023 <10  <10 mg/dL Final   Comment: (NOTE) Lowest detectable limit for serum alcohol is 10 mg/dL.  For medical purposes only. Performed at [facility], [address] [place], [place] [address]    Salicylate Lvl 05/23/2023 <7.0 (L)  7.0 - 30.0 mg/dL Final   Performed at [facility], [address], [place], [place] [address]   Acetaminophen (Tylenol), Serum 05/23/2023 <10 (L)  10 - 30 ug/mL Final   Comment: (NOTE) Therapeutic concentrations vary significantly. A range of  10-30 ug/mL  may be an effective concentration for many patients. However, some  are best treated at concentrations outside of this range. Acetaminophen concentrations >150 ug/mL at 4 hours after ingestion  and >50 ug/mL at 12 hours after ingestion are often associated with  toxic reactions.  Performed at [facility], [address] [place], [place] [address]    WBC 05/23/2023 5.8  4.0 - 10.5 K/uL Final   RBC 05/23/2023 5.02  4.22 - 5.81 MIL/uL Final   Hemoglobin 05/23/2023 14.9  13.0 - 17.0 g/dL Final   HCT 47/42/5956 44.5  39.0 - 52.0 % Final   MCV 05/23/2023 88.6  80.0 - 100.0 fL Final   MCH 05/23/2023 29.7  26.0 - 34.0 pg Final   MCHC 05/23/2023 33.5  30.0 - 36.0 g/dL Final   RDW 38/75/6433 13.3  11.5 - 15.5 % Final   Platelets 05/23/2023 213  150 - 400 K/uL Final   nRBC 05/23/2023 0.0  0.0 - 0.2 % Final   Performed  at [facility], [address], [place], [place] [address]   Opiates 05/23/2023 NONE DETECTED  NONE DETECTED Final   Cocaine 05/23/2023 NONE DETECTED  NONE DETECTED Final   Benzodiazepines 05/23/2023 NONE DETECTED  NONE DETECTED Final   Amphetamines 05/23/2023 NONE DETECTED  NONE DETECTED Final   Tetrahydrocannabinol 05/23/2023 POSITIVE (A)  NONE DETECTED Final   Barbiturates 05/23/2023 NONE DETECTED  NONE DETECTED Final   Comment: (NOTE) DRUG SCREEN FOR MEDICAL PURPOSES ONLY.  IF CONFIRMATION IS NEEDED FOR ANY PURPOSE, NOTIFY LAB WITHIN 5 DAYS.  LOWEST DETECTABLE LIMITS FOR URINE DRUG SCREEN Drug Class                     Cutoff (ng/mL) Amphetamine and metabolites    1000 Barbiturate and metabolites    200 Benzodiazepine                 200 Opiates and metabolites        300 Cocaine and metabolites        300 THC                            50 Performed at [facility], [address] [place], [place] [address]   Admission on 04/28/2023, Discharged on 05/03/2023  Component Date Value Ref Range Status   Cholesterol 04/30/2023 168  0 - 200 mg/dL Final    Triglycerides 04/30/2023 108  <150 mg/dL Final   HDL 09/81/1914 55  >40 mg/dL Final   Total CHOL/HDL Ratio 04/30/2023 3.1  RATIO Final   VLDL 04/30/2023 22  0 - 40 mg/dL Final   LDL Cholesterol 04/30/2023 91  0 - 99 mg/dL Final   Comment:        Total Cholesterol/HDL:CHD Risk Coronary Heart Disease Risk Table                     Men   Women  1/2 Average Risk   3.4   3.3  Average Risk       5.0   4.4  2 X Average Risk   9.6   7.1  3 X Average Risk  23.4   11.0        Use the calculated Patient Ratio above and the CHD Risk Table to determine the patient's CHD Risk.        ATP III CLASSIFICATION (LDL):  <100     mg/dL   Optimal  782-956  mg/dL   Near or Above                    Optimal  130-159  mg/dL   Borderline  213-086  mg/dL   High  >578     mg/dL   Very High Performed at [facility], [address], [place], [place] [address]    Hgb A1c MFr Bld 04/30/2023 5.3  4.8 - 5.6 % Final   Comment: (NOTE)         Prediabetes: 5.7 - 6.4         Diabetes: >6.4         Glycemic control for adults with diabetes: <7.0    Mean Plasma Glucose 04/30/2023 105  mg/dL Final   Comment: (NOTE) Performed At: [facility] [address] [place], [place] [id number] Jolene Schimke MD MW:[id number]   Admission on 04/27/2023, Discharged on 04/28/2023  Component Date Value Ref Range Status   Sodium 04/27/2023  135  135 - 145 mmol/L Final   Potassium 04/27/2023 3.7  3.5 - 5.1 mmol/L Final   Chloride 04/27/2023 99  98 - 111 mmol/L Final   CO2 04/27/2023 25  22 - 32 mmol/L Final   Glucose, Bld 04/27/2023 79  70 - 99 mg/dL Final   Glucose reference range applies only to samples taken after fasting for at least 8 hours.   BUN 04/27/2023 10  6 - 20 mg/dL Final   Creatinine, Ser 04/27/2023 0.87  0.61 - 1.24 mg/dL Final   Calcium 64/40/3474 9.3  8.9 - 10.3 mg/dL Final   Total Protein 25/95/6387 7.8  6.5 - 8.1 g/dL Final   Albumin 56/43/3295 4.7  3.5 - 5.0 g/dL Final   AST  18/84/1660 21  15 - 41 U/L Final   ALT 04/27/2023 19  0 - 44 U/L Final   Alkaline Phosphatase 04/27/2023 65  38 - 126 U/L Final   Total Bilirubin 04/27/2023 0.9  0.3 - 1.2 mg/dL Final   GFR, Estimated 04/27/2023 >60  >60 mL/min Final   Comment: (NOTE) Calculated using the CKD-EPI Creatinine Equation (2021)    Anion gap 04/27/2023 11  5 - 15 Final   Performed at [facility], [address] [place], [place] [address]   Alcohol, Ethyl (B) 04/27/2023 <10  <10 mg/dL Final   Comment: (NOTE) Lowest detectable limit for serum alcohol is 10 mg/dL.  For medical purposes only. Performed at [facility], [address], [place], [place] [address]    Salicylate Lvl 04/27/2023 <7.0 (L)  7.0 - 30.0 mg/dL Final   Performed at [facility], [address], [place], [place] [address]   Acetaminophen (Tylenol), Serum 04/27/2023 <10 (L)  10 - 30 ug/mL Final   Comment: (NOTE) Therapeutic concentrations vary significantly. A range of 10-30 ug/mL  may be an effective concentration for many patients. However, some  are best treated at concentrations outside of this range. Acetaminophen concentrations >150 ug/mL at 4 hours after ingestion  and >50 ug/mL at 12 hours after ingestion are often associated with  toxic reactions.  Performed at [facility], [address] [place], [place] [address]    WBC 04/27/2023 7.1  4.0 - 10.5 K/uL Final   RBC 04/27/2023 5.41  4.22 - 5.81 MIL/uL Final   Hemoglobin 04/27/2023 16.1  13.0 - 17.0 g/dL Final   HCT 54/27/0623 47.5  39.0 - 52.0 % Final   MCV 04/27/2023 87.8  80.0 - 100.0 fL Final   MCH 04/27/2023 29.8  26.0 - 34.0 pg Final   MCHC 04/27/2023 33.9  30.0 - 36.0 g/dL Final   RDW 76/28/3151 13.5  11.5 - 15.5 % Final   Platelets 04/27/2023 226  150 - 400 K/uL Final   nRBC 04/27/2023 0.0  0.0 - 0.2 % Final   Performed at [facility], [address] [place], [place] [address]   Opiates 04/27/2023 NONE DETECTED  NONE DETECTED Final   Cocaine 04/27/2023 NONE DETECTED   NONE DETECTED Final   Benzodiazepines 04/27/2023 NONE DETECTED  NONE DETECTED Final   Amphetamines 04/27/2023 NONE DETECTED  NONE DETECTED Final   Tetrahydrocannabinol 04/27/2023 POSITIVE (A)  NONE DETECTED Final   Barbiturates 04/27/2023 NONE DETECTED  NONE DETECTED Final   Comment: (NOTE) DRUG SCREEN FOR MEDICAL PURPOSES ONLY.  IF CONFIRMATION IS NEEDED FOR ANY PURPOSE, NOTIFY LAB WITHIN 5 DAYS.  LOWEST DETECTABLE LIMITS FOR URINE DRUG SCREEN Drug Class  Cutoff (ng/mL) Amphetamine and metabolites    1000 Barbiturate and metabolites    200 Benzodiazepine                 200 Opiates and metabolites        300 Cocaine and metabolites        300 THC                            50 Performed at [facility], [address] [place], [place] [address]     Allergies: Patient has no known allergies.  Medications:  Facility Ordered Medications  Medication   [COMPLETED] hydrOXYzine (ATARAX) tablet 50 mg   acetaminophen (TYLENOL) tablet 650 mg   alum & mag hydroxide-simeth (MAALOX/MYLANTA) 200-200-20 MG/5ML suspension 30 mL   magnesium hydroxide (MILK OF MAGNESIA) suspension 30 mL   hydrOXYzine (ATARAX) tablet 25 mg   traZODone (DESYREL) tablet 50 mg   venlafaxine XR (EFFEXOR-XR) 24 hr capsule 150 mg   mirtazapine (REMERON) tablet 15 mg   PTA Medications  Medication Sig   nicotine (NICODERM CQ - DOSED IN MG/24 HOURS) 14 mg/24hr patch Place 1 patch (14 mg total) onto the skin daily.   traZODone (DESYREL) 100 MG tablet Take 1 tablet (100 mg total) by mouth at bedtime as needed for sleep.    Long Term Goals: Improvement in symptoms so as ready for discharge  Short Term Goals: Patient will verbalize feelings in meetings with treatment team members., Patient will attend at least of 50% of the groups daily., Pt will complete the PHQ9 on admission, day 3 and discharge., Patient will participate in completing the [place] Suicide Severity Rating Scale, Patient will score  a low risk of violence for 24 hours prior to discharge, and Patient will take medications as prescribed daily.  Medical Decision Making  Admit to [facility], continue Venlafaxine 150 mg  for depression and anxiety. Hydroxyzine 50 mg PRN for anxiety.   Remeron 15 mg at bedtime for sleep, depression, and anxiety. Labs reviewed from ED encounter and patient is medically stable. PHQ-9 Completed.  Recommendations  Based on my evaluation the patient does not appear to have an emergency medical condition.  Joaquin Courts, NP 09/01/23  6:37 AM

## 2023-09-01 ENCOUNTER — Encounter (HOSPITAL_COMMUNITY): Payer: Self-pay | Admitting: Dietician

## 2023-09-01 DIAGNOSIS — Z91199 Patient's noncompliance with other medical treatment and regimen due to unspecified reason: Secondary | ICD-10-CM | POA: Insufficient documentation

## 2023-09-01 DIAGNOSIS — F411 Generalized anxiety disorder: Secondary | ICD-10-CM | POA: Insufficient documentation

## 2023-09-01 DIAGNOSIS — Z87891 Personal history of nicotine dependence: Secondary | ICD-10-CM | POA: Insufficient documentation

## 2023-09-01 DIAGNOSIS — Z5986 Financial insecurity: Secondary | ICD-10-CM | POA: Insufficient documentation

## 2023-09-01 DIAGNOSIS — Z9151 Personal history of suicidal behavior: Secondary | ICD-10-CM | POA: Insufficient documentation

## 2023-09-01 DIAGNOSIS — F332 Major depressive disorder, recurrent severe without psychotic features: Secondary | ICD-10-CM | POA: Insufficient documentation

## 2023-09-01 DIAGNOSIS — R45851 Suicidal ideations: Secondary | ICD-10-CM | POA: Insufficient documentation

## 2023-09-01 DIAGNOSIS — Z79899 Other long term (current) drug therapy: Secondary | ICD-10-CM | POA: Diagnosis not present

## 2023-09-01 DIAGNOSIS — F419 Anxiety disorder, unspecified: Secondary | ICD-10-CM | POA: Insufficient documentation

## 2023-09-01 DIAGNOSIS — Z5901 Sheltered homelessness: Secondary | ICD-10-CM | POA: Insufficient documentation

## 2023-09-01 MED ORDER — NICOTINE 21 MG/24HR TD PT24
21.0000 mg | MEDICATED_PATCH | Freq: Every day | TRANSDERMAL | Status: DC
  Administered 2023-09-01 – 2023-09-04 (×4): 21 mg via TRANSDERMAL
  Filled 2023-09-01: qty 7
  Filled 2023-09-01 (×4): qty 1

## 2023-09-01 MED ORDER — HYDROXYZINE HCL 25 MG PO TABS
50.0000 mg | ORAL_TABLET | Freq: Three times a day (TID) | ORAL | Status: DC | PRN
  Administered 2023-09-01 – 2023-09-04 (×5): 50 mg via ORAL
  Filled 2023-09-01: qty 2
  Filled 2023-09-01: qty 20
  Filled 2023-09-01 (×4): qty 2

## 2023-09-01 NOTE — ED Notes (Signed)
Patient in the bedroom resting and does not want to participate in AA this evening. Denies SI/HI &AVH. NAD. Respirations are even and unlabored. Will continue to monitor for safety.

## 2023-09-01 NOTE — ED Notes (Signed)
 Patient in the bedroom sleeping. NAD.  Respirations are even and unlabored. Will continue to monitor for safety.

## 2023-09-01 NOTE — Group Note (Signed)
Group Topic: Relapse and Recovery  Group Date: 09/01/2023 Start Time: 2000 End Time: 2100 Facilitators: Rae Lips B  Department: [facility]  Number of Participants: 6  Group Focus: activities of daily living skills, chemical dependency education, and chemical dependency issues Treatment Modality:  Leisure Development and Spiritual Interventions utilized were problem solving, reality testing, story telling, and support Purpose: express feelings  Name: Angel [name] Date of Birth: 1981-04-10  MR: [id number]    Level of Participation: PT DID NOT ATTEND GROUP Quality of Participation: withdrawn Interactions with others: gave feedback Mood/Affect: appropriate Triggers (if applicable): NA Cognition: coherent/clear Progress: Minimal Response: NA Plan: patient will be encouraged to go to groups  Patients Problems:  Patient Active Problem List   Diagnosis Date Noted   Adjustment disorder with depressed mood 08/31/2023   MDD (major depressive disorder), recurrent episode, severe (HCC) 08/31/2023   Housing instability after recent homelessness 06/21/2023   Scabies 06/21/2023   MDD (major depressive disorder) 06/20/2023   Suicidal ideation 05/03/2023   GERD (gastroesophageal reflux disease) 03/11/2018   Tobacco use disorder 07/17/2015   Generalized anxiety disorder 11/01/2013   MDD (major depressive disorder), recurrent severe, without psychosis (HCC) 10/31/2013

## 2023-09-01 NOTE — ED Notes (Addendum)
Patient alert and oriented x 3. Endorses passives SI denies HI/AVH. Denies intent to harm others. Patient did contract for safety. Patient denies a plan at this time. Routine conducted according to faculty protocol. Encourage patient to notify staff with any needs or concerns. Patient verbalized agreement and understanding. Will continue to monitor for safety.Rn will notify provider of  passive SI .

## 2023-09-01 NOTE — Group Note (Signed)
Group Topic: Communication  Group Date: 09/01/2023 Start Time: 1104 End Time: 1126 Facilitators: Merrie Roof, RN  Department: [facility]  Number of Participants: 6  Group Focus: communication Treatment Modality:  Patient-Centered Therapy Interventions utilized were group exercise Purpose: express feelings  Name: Angel [name] Date of Birth: September 08, 1981  MR: [id number]    Level of Participation: active Quality of Participation: cooperative Interactions with others: gave feedback Mood/Affect: appropriate Triggers (if applicable): na Cognition: coherent/clear Progress: Significant Response: good Plan: patient will be encouraged to continue with therapy  Patients Problems:  Patient Active Problem List   Diagnosis Date Noted   Adjustment disorder with depressed mood 08/31/2023   MDD (major depressive disorder), recurrent episode, severe (HCC) 08/31/2023   Housing instability after recent homelessness 06/21/2023   Scabies 06/21/2023   MDD (major depressive disorder) 06/20/2023   Suicidal ideation 05/03/2023   GERD (gastroesophageal reflux disease) 03/11/2018   Tobacco use disorder 07/17/2015   Generalized anxiety disorder 11/01/2013   MDD (major depressive disorder), recurrent severe, without psychosis (HCC) 10/31/2013

## 2023-09-01 NOTE — ED Notes (Signed)
Patient in room. Environment is secured. Will continue to monitor for safety. 

## 2023-09-01 NOTE — ED Notes (Signed)
 Pt was provided lunch

## 2023-09-01 NOTE — ED Provider Notes (Signed)
Behavioral Health Progress Note  Date and Time: 09/02/2023 2:24 PM Name: Angel [name] MRN:  [id number]  Subjective:  Angel [name] is a 42 y.o. male, with PMH of MDD, GAD, tobacco use d/o, cannabis use d/o, SIB, chronic SI, suicide attempt + inpatient psych admission (last time 06/20/2023), housing instability, who presented to [facility] (08/31/2023) for ongoing SI x4 months despite multiple inpatient psych admission within that time, he was admitted to [facility]  Patient reported anxious and depressed mood, better since he is in a safe environment. Still worried about housing. Worked with patient problem solving to help with situation. Wants to work indoors, but will work outside if needed to save enough money to get a new ID. Only has [employer] and birth certificate. Endorsed passive SI, unchanged for months.   Sleep: Fair Appetite:  Good  Endorsed passive SI per above. Denied active SI, HI, AVH, paranoia.   Review of Systems  Constitutional:  Positive for malaise/fatigue.  Respiratory:  Negative for shortness of breath.   Cardiovascular:  Negative for chest pain.  Gastrointestinal:  Negative for nausea and vomiting.  Neurological:  Negative for dizziness and headaches.     Diagnosis:  Final diagnoses:  Severe episode of recurrent major depressive disorder, without psychotic features (HCC)    Past Psychiatric History:  Previous Psych Diagnoses: MDD, GAD, tobacco use d/o, cannabis use d/o, SIB, chronic SI Prior inpatient treatment:  Admitted to psych hospital for SI 07/2023 [place], [facility] 05/2023, [facility] 04/2023, [facility] 04/2023. Admitted to behavioral health in 08/2018, for suicidal attempt, 03/09/2018 admitted to behavioral health for suicidal ideations, and 05/16/2017 admitted to behavioral health for suicidal ideation, 10/31/2013 admitted to behavioral health for suicidal ideation.  Reported being admitted to behavioral health in [place] but does not remember the  year. Current/prior outpatient treatment: yes, med management Prior rehab hx: Denies Psychotherapy hx: Yes History of suicide: 1 suicide attempt by wrist cutting  History of homicide or aggression: Denies Psychiatric medication history: Fluoxetine which helped his anxiety, Vistaril which helps with anxiety, Remeron which helped his sleep, no reported medications that he reports poor outcomes from  Psychiatric medication compliance history: Noncompliant due to financial difficulty Neuromodulation history: Denies   Substance Abuse Hx: Alcohol: No current or past use Tobacco: Occasional use of cigarettes Illicit drugs: No current or past use of illicit substances except occasional cannabis use. Rx drug abuse: No past or current use Rehab hx: No history of substance rehab   Past Medical History: Medical Diagnoses: Migraines treated with Tylenol extra-strength Home Rx: No home meds Prior Hosp: No reported medical hospitalizations Prior Surgeries/Trauma: Reports no surgeries Head trauma, LOC, concussions, seizures: No history of TBI or seizures.  Allergies: No known drug allergies PCP: No current PCP   Family History: Medical: Does not report any medical family history Psych: Does not report any mental health issues or substance use except for MDD and aunt Psych Rx: No reported medication responses SA/HA: No reported family history of SA or HA Substance use family hx: None reported   Social History: Childhood (bring, raised, lives now, parents, siblings, schooling, education): Does not go into details on his childhood but has a very poor relationship with his family currently.  Reports having a brother. Abuse: Does not report any physical, emotional, sexual abuse anytime in life. Marital Status: Single Sexual orientation: Did not report Children: No children Employment: Currently unemployed.  Did not go into detail about his past employment Peer Group: Reports having  no social  support or peer group. Housing: Also living a house with his previous friends when they abandoned him and he was forced to leave the house. Finances: Significant financial issues Legal: No current legal issues. Military: Did not ask the patient.  Total Time spent with patient: 30 minutes  Additional Social History:                         Current Medications:  Current Facility-Administered Medications  Medication Dose Route Frequency Provider Last Rate Last Admin   acetaminophen (TYLENOL) tablet 650 mg  650 mg Oral Q6H PRN Bing Neighbors, NP       alum & mag hydroxide-simeth (MAALOX/MYLANTA) 200-200-20 MG/5ML suspension 30 mL  30 mL Oral Q4H PRN Bing Neighbors, NP       hydrOXYzine (ATARAX) tablet 50 mg  50 mg Oral TID PRN Princess Bruins, DO   50 mg at 09/02/23 1345   magnesium hydroxide (MILK OF MAGNESIA) suspension 30 mL  30 mL Oral Daily PRN Bing Neighbors, NP       mirtazapine (REMERON) tablet 15 mg  15 mg Oral QHS Bing Neighbors, NP   15 mg at 09/01/23 2117   nicotine (NICODERM CQ - dosed in mg/24 hours) patch 21 mg  21 mg Transdermal Daily Princess Bruins, DO   21 mg at 09/02/23 0942   traZODone (DESYREL) tablet 50 mg  50 mg Oral QHS PRN Bing Neighbors, NP   50 mg at 09/01/23 2117   venlafaxine XR (EFFEXOR-XR) 24 hr capsule 150 mg  150 mg Oral Q breakfast Bing Neighbors, NP   150 mg at 09/02/23 4098   Current Outpatient Medications  Medication Sig Dispense Refill   mirtazapine (REMERON) 15 MG tablet Take 1 tablet (15 mg total) by mouth at bedtime. 30 tablet 0   nicotine (NICODERM CQ - DOSED IN MG/24 HOURS) 14 mg/24hr patch Place 1 patch (14 mg total) onto the skin daily. 28 patch 0   traZODone (DESYREL) 100 MG tablet Take 1 tablet (100 mg total) by mouth at bedtime as needed for sleep. 30 tablet 0   venlafaxine XR (EFFEXOR-XR) 150 MG 24 hr capsule Take 1 capsule (150 mg total) by mouth daily with breakfast. 30 capsule 0    Labs  Lab Results:   Admission on 08/30/2023, Discharged on 08/31/2023  Component Date Value Ref Range Status   Sodium 08/30/2023 137  135 - 145 mmol/L Final   Potassium 08/30/2023 3.2 (L)  3.5 - 5.1 mmol/L Final   Chloride 08/30/2023 103  98 - 111 mmol/L Final   CO2 08/30/2023 24  22 - 32 mmol/L Final   Glucose, Bld 08/30/2023 90  70 - 99 mg/dL Final   Glucose reference range applies only to samples taken after fasting for at least 8 hours.   BUN 08/30/2023 14  6 - 20 mg/dL Final   Creatinine, Ser 08/30/2023 0.74  0.61 - 1.24 mg/dL Final   Calcium 11/91/4782 8.8 (L)  8.9 - 10.3 mg/dL Final   Total Protein 95/62/1308 7.1  6.5 - 8.1 g/dL Final   Albumin 65/78/4696 4.0  3.5 - 5.0 g/dL Final   AST 29/52/8413 17  15 - 41 U/L Final   ALT 08/30/2023 23  0 - 44 U/L Final   Alkaline Phosphatase 08/30/2023 73  38 - 126 U/L Final   Total Bilirubin 08/30/2023 0.3  0.3 - 1.2 mg/dL Final   GFR, Estimated 08/30/2023 >  60  >60 mL/min Final   Comment: (NOTE) Calculated using the CKD-EPI Creatinine Equation (2021)    Anion gap 08/30/2023 10  5 - 15 Final   Performed at [facility], [address], [place], [place] [address]   Alcohol, Ethyl (B) 08/30/2023 <10  <10 mg/dL Final   Comment: (NOTE) Lowest detectable limit for serum alcohol is 10 mg/dL.  For medical purposes only. Performed at [facility], [address], [place], [place] [address]    Opiates 08/30/2023 NONE DETECTED  NONE DETECTED Final   Cocaine 08/30/2023 NONE DETECTED  NONE DETECTED Final   Benzodiazepines 08/30/2023 NONE DETECTED  NONE DETECTED Final   Amphetamines 08/30/2023 NONE DETECTED  NONE DETECTED Final   Tetrahydrocannabinol 08/30/2023 NONE DETECTED  NONE DETECTED Final   Barbiturates 08/30/2023 NONE DETECTED  NONE DETECTED Final   Comment: (NOTE) DRUG SCREEN FOR MEDICAL PURPOSES ONLY.  IF CONFIRMATION IS NEEDED FOR ANY PURPOSE, NOTIFY LAB WITHIN 5 DAYS.  LOWEST DETECTABLE LIMITS FOR URINE DRUG SCREEN Drug Class                      Cutoff (ng/mL) Amphetamine and metabolites    1000 Barbiturate and metabolites    200 Benzodiazepine                 200 Opiates and metabolites        300 Cocaine and metabolites        300 THC                            50 Performed at [facility], [address] [place], [place] [address]    WBC 08/30/2023 7.3  4.0 - 10.5 K/uL Final   RBC 08/30/2023 4.35  4.22 - 5.81 MIL/uL Final   Hemoglobin 08/30/2023 12.9 (L)  13.0 - 17.0 g/dL Final   HCT 88/41/6606 38.9 (L)  39.0 - 52.0 % Final   MCV 08/30/2023 89.4  80.0 - 100.0 fL Final   MCH 08/30/2023 29.7  26.0 - 34.0 pg Final   MCHC 08/30/2023 33.2  30.0 - 36.0 g/dL Final   RDW 30/16/0109 14.2  11.5 - 15.5 % Final   Platelets 08/30/2023 282  150 - 400 K/uL Final   nRBC 08/30/2023 0.3 (H)  0.0 - 0.2 % Final   Neutrophils Relative % 08/30/2023 32  % Final   Neutro Abs 08/30/2023 2.3  1.7 - 7.7 K/uL Final   Lymphocytes Relative 08/30/2023 54  % Final   Lymphs Abs 08/30/2023 4.0  0.7 - 4.0 K/uL Final   Monocytes Relative 08/30/2023 10  % Final   Monocytes Absolute 08/30/2023 0.7  0.1 - 1.0 K/uL Final   Eosinophils Relative 08/30/2023 3  % Final   Eosinophils Absolute 08/30/2023 0.2  0.0 - 0.5 K/uL Final   Basophils Relative 08/30/2023 1  % Final   Basophils Absolute 08/30/2023 0.1  0.0 - 0.1 K/uL Final   Immature Granulocytes 08/30/2023 0  % Final   Abs Immature Granulocytes 08/30/2023 0.01  0.00 - 0.07 K/uL Final   Performed at [facility], [address] [place], [place] [address]   Sodium 08/31/2023 136  135 - 145 mmol/L Final   Potassium 08/31/2023 3.6  3.5 - 5.1 mmol/L Final   Chloride 08/31/2023 104  98 - 111 mmol/L Final   CO2 08/31/2023 24  22 - 32 mmol/L Final   Glucose, Bld 08/31/2023 94  70 -  99 mg/dL Final   Glucose reference range applies only to samples taken after fasting for at least 8 hours.   BUN 08/31/2023 13  6 - 20 mg/dL Final   Creatinine, Ser 08/31/2023 0.79  0.61 - 1.24 mg/dL Final   Calcium 16/08/9603 8.3  (L)  8.9 - 10.3 mg/dL Final   GFR, Estimated 08/31/2023 >60  >60 mL/min Final   Comment: (NOTE) Calculated using the CKD-EPI Creatinine Equation (2021)    Anion gap 08/31/2023 8  5 - 15 Final   Performed at [facility], [address] [place], [place] [address]  Admission on 07/29/2023, Discharged on 07/30/2023  Component Date Value Ref Range Status   Sodium 07/29/2023 135  135 - 145 mmol/L Final   Potassium 07/29/2023 3.3 (L)  3.5 - 5.1 mmol/L Final   Chloride 07/29/2023 102  98 - 111 mmol/L Final   CO2 07/29/2023 24  22 - 32 mmol/L Final   Glucose, Bld 07/29/2023 97  70 - 99 mg/dL Final   Glucose reference range applies only to samples taken after fasting for at least 8 hours.   BUN 07/29/2023 14  6 - 20 mg/dL Final   Creatinine, Ser 07/29/2023 0.75  0.61 - 1.24 mg/dL Final   Calcium 11/91/4782 8.6 (L)  8.9 - 10.3 mg/dL Final   Total Protein 95/62/1308 6.8  6.5 - 8.1 g/dL Final   Albumin 65/78/4696 3.9  3.5 - 5.0 g/dL Final   AST 29/52/8413 21  15 - 41 U/L Final   ALT 07/29/2023 24  0 - 44 U/L Final   Alkaline Phosphatase 07/29/2023 82  38 - 126 U/L Final   Total Bilirubin 07/29/2023 0.5  0.3 - 1.2 mg/dL Final   GFR, Estimated 07/29/2023 >60  >60 mL/min Final   Comment: (NOTE) Calculated using the CKD-EPI Creatinine Equation (2021)    Anion gap 07/29/2023 9  5 - 15 Final   Performed at [facility], [address] [place], [place] [address]   Alcohol, Ethyl (B) 07/29/2023 <10  <10 mg/dL Final   Comment: (NOTE) Lowest detectable limit for serum alcohol is 10 mg/dL.  For medical purposes only. Performed at [facility], [address], [place], [place] [address]    Salicylate Lvl 07/29/2023 <7.0 (L)  7.0 - 30.0 mg/dL Final   Performed at [facility], [address] [place], [place] [address]   Acetaminophen (Tylenol), Serum 07/29/2023 <10 (L)  10 - 30 ug/mL Final   Comment: (NOTE) Therapeutic concentrations vary significantly. A range of 10-30 ug/mL  may be an effective  concentration for many patients. However, some  are best treated at concentrations outside of this range. Acetaminophen concentrations >150 ug/mL at 4 hours after ingestion  and >50 ug/mL at 12 hours after ingestion are often associated with  toxic reactions.  Performed at [facility], [address] [place], [place] [address]    WBC 07/29/2023 10.2  4.0 - 10.5 K/uL Final   RBC 07/29/2023 4.53  4.22 - 5.81 MIL/uL Final   Hemoglobin 07/29/2023 13.0  13.0 - 17.0 g/dL Final   HCT 25/95/6387 39.1  39.0 - 52.0 % Final   MCV 07/29/2023 86.3  80.0 - 100.0 fL Final   MCH 07/29/2023 28.7  26.0 - 34.0 pg Final   MCHC 07/29/2023 33.2  30.0 - 36.0 g/dL Final   RDW 56/43/3295 13.8  11.5 - 15.5 % Final   Platelets 07/29/2023 261  150 - 400 K/uL Final   nRBC 07/29/2023 0.0  0.0 -  0.2 % Final   Performed at [facility], [address], [place], [place] [address]   Opiates 07/29/2023 NONE DETECTED  NONE DETECTED Final   Cocaine 07/29/2023 NONE DETECTED  NONE DETECTED Final   Benzodiazepines 07/29/2023 NONE DETECTED  NONE DETECTED Final   Amphetamines 07/29/2023 NONE DETECTED  NONE DETECTED Final   Tetrahydrocannabinol 07/29/2023 NONE DETECTED  NONE DETECTED Final   Barbiturates 07/29/2023 NONE DETECTED  NONE DETECTED Final   Comment: (NOTE) DRUG SCREEN FOR MEDICAL PURPOSES ONLY.  IF CONFIRMATION IS NEEDED FOR ANY PURPOSE, NOTIFY LAB WITHIN 5 DAYS.  LOWEST DETECTABLE LIMITS FOR URINE DRUG SCREEN Drug Class                     Cutoff (ng/mL) Amphetamine and metabolites    1000 Barbiturate and metabolites    200 Benzodiazepine                 200 Opiates and metabolites        300 Cocaine and metabolites        300 THC                            50 Performed at [facility], [address] [place], [place] [address]   Admission on 06/20/2023, Discharged on 07/04/2023  Component Date Value Ref Range Status   HIV Screen 4th Generation wRfx 06/22/2023 Non Reactive  Non Reactive Final    Performed at [facility], [address] [place], [place] [address]   RPR Ser Ql 06/22/2023 NON REACTIVE  NON REACTIVE Final   Performed at [facility], [address], [place], [place] [address]   TSH 06/29/2023 2.636  0.350 - 4.500 uIU/mL Final   Comment: Performed by a 3rd Generation assay with a functional sensitivity of <=0.01 uIU/mL. Performed at [facility], [address], [place], [place] [address]   Admission on 06/20/2023, Discharged on 06/20/2023  Component Date Value Ref Range Status   Sodium 06/20/2023 135  135 - 145 mmol/L Final   Potassium 06/20/2023 3.8  3.5 - 5.1 mmol/L Final   Chloride 06/20/2023 103  98 - 111 mmol/L Final   CO2 06/20/2023 25  22 - 32 mmol/L Final   Glucose, Bld 06/20/2023 97  70 - 99 mg/dL Final   Glucose reference range applies only to samples taken after fasting for at least 8 hours.   BUN 06/20/2023 13  6 - 20 mg/dL Final   Creatinine, Ser 06/20/2023 0.68  0.61 - 1.24 mg/dL Final   Calcium 09/23/2535 9.0  8.9 - 10.3 mg/dL Final   Total Protein 64/40/3474 7.6  6.5 - 8.1 g/dL Final   Albumin 25/95/6387 4.2  3.5 - 5.0 g/dL Final   AST 56/43/3295 40  15 - 41 U/L Final   ALT 06/20/2023 98 (H)  0 - 44 U/L Final   Alkaline Phosphatase 06/20/2023 124  38 - 126 U/L Final   Total Bilirubin 06/20/2023 0.6  0.3 - 1.2 mg/dL Final   GFR, Estimated 06/20/2023 >60  >60 mL/min Final   Comment: (NOTE) Calculated using the CKD-EPI Creatinine Equation (2021)    Anion gap 06/20/2023 7  5 - 15 Final   Performed at [facility], [address] [place], [place] [address]   Alcohol, Ethyl (B) 06/20/2023 <10  <10 mg/dL Final   Comment: (NOTE) Lowest detectable limit for serum alcohol is 10 mg/dL.  For medical purposes only. Performed at [facility], [address], [place], [place] [address]    Salicylate Lvl 06/20/2023 <7.0 (L)  7.0 - 30.0 mg/dL Final   Performed at [facility], [address] [place], [place] [address]    Acetaminophen (Tylenol), Serum 06/20/2023 <10 (L)  10 - 30 ug/mL Final   Comment: (NOTE) Therapeutic concentrations vary significantly. A range of 10-30 ug/mL  may be an effective concentration for many patients. However, some  are best treated at concentrations outside of this range. Acetaminophen concentrations >150 ug/mL at 4 hours after ingestion  and >50 ug/mL at 12 hours after ingestion are often associated with  toxic reactions.  Performed at [facility], [address] [place], [place] [address]    WBC 06/20/2023 8.2  4.0 - 10.5 K/uL Final   RBC 06/20/2023 4.73  4.22 - 5.81 MIL/uL Final   Hemoglobin 06/20/2023 13.9  13.0 - 17.0 g/dL Final   HCT 40/08/2724 41.3  39.0 - 52.0 % Final   MCV 06/20/2023 87.3  80.0 - 100.0 fL Final   MCH 06/20/2023 29.4  26.0 - 34.0 pg Final   MCHC 06/20/2023 33.7  30.0 - 36.0 g/dL Final   RDW 36/64/4034 13.8  11.5 - 15.5 % Final   Platelets 06/20/2023 256  150 - 400 K/uL Final   nRBC 06/20/2023 0.0  0.0 - 0.2 % Final   Performed at [facility], [address] [place], [place] [address]   Opiates 06/20/2023 NONE DETECTED  NONE DETECTED Final   Cocaine 06/20/2023 NONE DETECTED  NONE DETECTED Final   Benzodiazepines 06/20/2023 NONE DETECTED  NONE DETECTED Final   Amphetamines 06/20/2023 NONE DETECTED  NONE DETECTED Final   Tetrahydrocannabinol 06/20/2023 NONE DETECTED  NONE DETECTED Final   Barbiturates 06/20/2023 NONE DETECTED  NONE DETECTED Final   Comment: (NOTE) DRUG SCREEN FOR MEDICAL PURPOSES ONLY.  IF CONFIRMATION IS NEEDED FOR ANY PURPOSE, NOTIFY LAB WITHIN 5 DAYS.  LOWEST DETECTABLE LIMITS FOR URINE DRUG SCREEN Drug Class                     Cutoff (ng/mL) Amphetamine and metabolites    1000 Barbiturate and metabolites    200 Benzodiazepine                 200 Opiates and metabolites        300 Cocaine and metabolites        300 THC                            50 Performed at [facility], [address], [place], [place]  [address]   Admission on 06/03/2023, Discharged on 06/04/2023  Component Date Value Ref Range Status   Sodium 06/03/2023 134 (L)  135 - 145 mmol/L Final   Potassium 06/03/2023 3.7  3.5 - 5.1 mmol/L Final   Chloride 06/03/2023 98  98 - 111 mmol/L Final   CO2 06/03/2023 21 (L)  22 - 32 mmol/L Final   Glucose, Bld 06/03/2023 79  70 - 99 mg/dL Final   Glucose reference range applies only to samples taken after fasting for at least 8 hours.   BUN 06/03/2023 18  6 - 20 mg/dL Final   Creatinine, Ser 06/03/2023 0.76  0.61 - 1.24 mg/dL Final   Calcium 56/43/3295 9.1  8.9 - 10.3 mg/dL Final   Total Protein 18/84/1660 7.3  6.5 - 8.1 g/dL Final  Albumin 06/03/2023 4.4  3.5 - 5.0 g/dL Final   AST 16/08/9603 61 (H)  15 - 41 U/L Final   ALT 06/03/2023 141 (H)  0 - 44 U/L Final   Alkaline Phosphatase 06/03/2023 79  38 - 126 U/L Final   Total Bilirubin 06/03/2023 1.1  0.3 - 1.2 mg/dL Final   GFR, Estimated 06/03/2023 >60  >60 mL/min Final   Comment: (NOTE) Calculated using the CKD-EPI Creatinine Equation (2021)    Anion gap 06/03/2023 15  5 - 15 Final   Performed at [facility], [address], [place], [place] [address]   Alcohol, Ethyl (B) 06/03/2023 <10  <10 mg/dL Final   Comment: (NOTE) Lowest detectable limit for serum alcohol is 10 mg/dL.  For medical purposes only. Performed at [facility], [address] [place], [place] [address]    WBC 06/03/2023 8.9  4.0 - 10.5 K/uL Final   RBC 06/03/2023 4.80  4.22 - 5.81 MIL/uL Final   Hemoglobin 06/03/2023 14.2  13.0 - 17.0 g/dL Final   HCT 78/29/5621 42.2  39.0 - 52.0 % Final   MCV 06/03/2023 87.9  80.0 - 100.0 fL Final   MCH 06/03/2023 29.6  26.0 - 34.0 pg Final   MCHC 06/03/2023 33.6  30.0 - 36.0 g/dL Final   RDW 30/86/5784 13.6  11.5 - 15.5 % Final   Platelets 06/03/2023 225  150 - 400 K/uL Final   nRBC 06/03/2023 0.0  0.0 - 0.2 % Final   Neutrophils Relative % 06/03/2023 62  % Final   Neutro Abs 06/03/2023 5.6  1.7 - 7.7 K/uL Final    Lymphocytes Relative 06/03/2023 29  % Final   Lymphs Abs 06/03/2023 2.5  0.7 - 4.0 K/uL Final   Monocytes Relative 06/03/2023 8  % Final   Monocytes Absolute 06/03/2023 0.7  0.1 - 1.0 K/uL Final   Eosinophils Relative 06/03/2023 1  % Final   Eosinophils Absolute 06/03/2023 0.0  0.0 - 0.5 K/uL Final   Basophils Relative 06/03/2023 0  % Final   Basophils Absolute 06/03/2023 0.0  0.0 - 0.1 K/uL Final   Immature Granulocytes 06/03/2023 0  % Final   Abs Immature Granulocytes 06/03/2023 0.03  0.00 - 0.07 K/uL Final   Performed at [facility], [address], [place], [place] [address]   SARS Coronavirus 2 by RT PCR 06/03/2023 NEGATIVE  NEGATIVE Final   Comment: (NOTE) SARS-CoV-2 target nucleic acids are NOT DETECTED.  The SARS-CoV-2 RNA is generally detectable in upper and lower respiratory specimens during the acute phase of infection. The lowest concentration of SARS-CoV-2 viral copies this assay can detect is 250 copies / mL. A negative result does not preclude SARS-CoV-2 infection and should not be used as the sole basis for treatment or other patient management decisions.  A negative result may occur with improper specimen collection / handling, submission of specimen other than nasopharyngeal swab, presence of viral mutation(s) within the areas targeted by this assay, and inadequate number of viral copies (<250 copies / mL). A negative result must be combined with clinical observations, patient history, and epidemiological information.  Fact Sheet for Patients:   RoadLapTop.co.za  Fact Sheet for Healthcare Providers: http://kim-miller.com/  This test is not yet approved or                           cleared by the [place] FDA and has been authorized for detection and/or diagnosis of SARS-CoV-2 by FDA  under an Emergency Use Authorization (EUA).  This EUA will remain in effect (meaning this test can be used) for the duration of  the COVID-19 declaration under Section 564(b)(1) of the Act, 21 U.S.C. section 360bbb-3(b)(1), unless the authorization is terminated or revoked sooner.  Performed at [facility], [address] [place], [place] [address]    Acetaminophen (Tylenol), Serum 06/03/2023 <10 (L)  10 - 30 ug/mL Final   Comment: (NOTE) Therapeutic concentrations vary significantly. A range of 10-30 ug/mL  may be an effective concentration for many patients. However, some  are best treated at concentrations outside of this range. Acetaminophen concentrations >150 ug/mL at 4 hours after ingestion  and >50 ug/mL at 12 hours after ingestion are often associated with  toxic reactions.  Performed at [facility], [address] [place], [place] [address]   Admission on 05/23/2023, Discharged on 05/23/2023  Component Date Value Ref Range Status   Sodium 05/23/2023 137  135 - 145 mmol/L Final   Potassium 05/23/2023 3.5  3.5 - 5.1 mmol/L Final   Chloride 05/23/2023 105  98 - 111 mmol/L Final   CO2 05/23/2023 23  22 - 32 mmol/L Final   Glucose, Bld 05/23/2023 111 (H)  70 - 99 mg/dL Final   Glucose reference range applies only to samples taken after fasting for at least 8 hours.   BUN 05/23/2023 12  6 - 20 mg/dL Final   Creatinine, Ser 05/23/2023 0.82  0.61 - 1.24 mg/dL Final   Calcium 09/81/1914 8.9  8.9 - 10.3 mg/dL Final   Total Protein 78/29/5621 6.8  6.5 - 8.1 g/dL Final   Albumin 30/86/5784 4.0  3.5 - 5.0 g/dL Final   AST 69/62/9528 32  15 - 41 U/L Final   ALT 05/23/2023 45 (H)  0 - 44 U/L Final   Alkaline Phosphatase 05/23/2023 58  38 - 126 U/L Final   Total Bilirubin 05/23/2023 0.6  0.3 - 1.2 mg/dL Final   GFR, Estimated 05/23/2023 >60  >60 mL/min Final   Comment: (NOTE) Calculated using the CKD-EPI Creatinine Equation (2021)    Anion gap 05/23/2023 9  5 - 15 Final   Performed at [facility], [address], [place], [place] [address]   Alcohol, Ethyl (B) 05/23/2023 <10  <10 mg/dL Final   Comment:  (NOTE) Lowest detectable limit for serum alcohol is 10 mg/dL.  For medical purposes only. Performed at [facility], [address], [place], [place] [address]    Salicylate Lvl 05/23/2023 <7.0 (L)  7.0 - 30.0 mg/dL Final   Performed at [facility], [address], [place], [place] [address]   Acetaminophen (Tylenol), Serum 05/23/2023 <10 (L)  10 - 30 ug/mL Final   Comment: (NOTE) Therapeutic concentrations vary significantly. A range of 10-30 ug/mL  may be an effective concentration for many patients. However, some  are best treated at concentrations outside of this range. Acetaminophen concentrations >150 ug/mL at 4 hours after ingestion  and >50 ug/mL at 12 hours after ingestion are often associated with  toxic reactions.  Performed at [facility], [address] [place], [place] [address]    WBC 05/23/2023 5.8  4.0 - 10.5 K/uL Final   RBC 05/23/2023 5.02  4.22 - 5.81 MIL/uL Final   Hemoglobin 05/23/2023 14.9  13.0 - 17.0 g/dL Final   HCT 47/42/5956 44.5  39.0 - 52.0 % Final   MCV 05/23/2023 88.6  80.0 - 100.0 fL Final   MCH 05/23/2023 29.7  26.0 - 34.0  pg Final   MCHC 05/23/2023 33.5  30.0 - 36.0 g/dL Final   RDW 09/81/1914 13.3  11.5 - 15.5 % Final   Platelets 05/23/2023 213  150 - 400 K/uL Final   nRBC 05/23/2023 0.0  0.0 - 0.2 % Final   Performed at [facility], [address] [place], [place] [address]   Opiates 05/23/2023 NONE DETECTED  NONE DETECTED Final   Cocaine 05/23/2023 NONE DETECTED  NONE DETECTED Final   Benzodiazepines 05/23/2023 NONE DETECTED  NONE DETECTED Final   Amphetamines 05/23/2023 NONE DETECTED  NONE DETECTED Final   Tetrahydrocannabinol 05/23/2023 POSITIVE (A)  NONE DETECTED Final   Barbiturates 05/23/2023 NONE DETECTED  NONE DETECTED Final   Comment: (NOTE) DRUG SCREEN FOR MEDICAL PURPOSES ONLY.  IF CONFIRMATION IS NEEDED FOR ANY PURPOSE, NOTIFY LAB WITHIN 5 DAYS.  LOWEST DETECTABLE LIMITS FOR URINE DRUG SCREEN Drug Class                      Cutoff (ng/mL) Amphetamine and metabolites    1000 Barbiturate and metabolites    200 Benzodiazepine                 200 Opiates and metabolites        300 Cocaine and metabolites        300 THC                            50 Performed at [facility], [address] [place], [place] [address]   Admission on 04/28/2023, Discharged on 05/03/2023  Component Date Value Ref Range Status   Cholesterol 04/30/2023 168  0 - 200 mg/dL Final   Triglycerides 86/57/8469 108  <150 mg/dL Final   HDL 62/95/2841 55  >40 mg/dL Final   Total CHOL/HDL Ratio 04/30/2023 3.1  RATIO Final   VLDL 04/30/2023 22  0 - 40 mg/dL Final   LDL Cholesterol 04/30/2023 91  0 - 99 mg/dL Final   Comment:        Total Cholesterol/HDL:CHD Risk Coronary Heart Disease Risk Table                     Men   Women  1/2 Average Risk   3.4   3.3  Average Risk       5.0   4.4  2 X Average Risk   9.6   7.1  3 X Average Risk  23.4   11.0        Use the calculated Patient Ratio above and the CHD Risk Table to determine the patient's CHD Risk.        ATP III CLASSIFICATION (LDL):  <100     mg/dL   Optimal  324-401  mg/dL   Near or Above                    Optimal  130-159  mg/dL   Borderline  027-253  mg/dL   High  >664     mg/dL   Very High Performed at [facility], [address], [place], [place] [address]    Hgb A1c MFr Bld 04/30/2023 5.3  4.8 - 5.6 % Final   Comment: (NOTE)         Prediabetes: 5.7 - 6.4         Diabetes: >6.4         Glycemic control for adults with diabetes: <7.0  Mean Plasma Glucose 04/30/2023 105  mg/dL Final   Comment: (NOTE) Performed At: [facility] [address] [place], [place] [id number] Jolene Schimke MD NW:[id number]   Admission on 04/27/2023, Discharged on 04/28/2023  Component Date Value Ref Range Status   Sodium 04/27/2023 135  135 - 145 mmol/L Final   Potassium 04/27/2023 3.7  3.5 - 5.1 mmol/L Final   Chloride 04/27/2023 99  98 - 111 mmol/L  Final   CO2 04/27/2023 25  22 - 32 mmol/L Final   Glucose, Bld 04/27/2023 79  70 - 99 mg/dL Final   Glucose reference range applies only to samples taken after fasting for at least 8 hours.   BUN 04/27/2023 10  6 - 20 mg/dL Final   Creatinine, Ser 04/27/2023 0.87  0.61 - 1.24 mg/dL Final   Calcium 57/84/6962 9.3  8.9 - 10.3 mg/dL Final   Total Protein 95/28/4132 7.8  6.5 - 8.1 g/dL Final   Albumin 44/11/270 4.7  3.5 - 5.0 g/dL Final   AST 53/66/4403 21  15 - 41 U/L Final   ALT 04/27/2023 19  0 - 44 U/L Final   Alkaline Phosphatase 04/27/2023 65  38 - 126 U/L Final   Total Bilirubin 04/27/2023 0.9  0.3 - 1.2 mg/dL Final   GFR, Estimated 04/27/2023 >60  >60 mL/min Final   Comment: (NOTE) Calculated using the CKD-EPI Creatinine Equation (2021)    Anion gap 04/27/2023 11  5 - 15 Final   Performed at [facility], [address] [place], [place] [address]   Alcohol, Ethyl (B) 04/27/2023 <10  <10 mg/dL Final   Comment: (NOTE) Lowest detectable limit for serum alcohol is 10 mg/dL.  For medical purposes only. Performed at [facility], [address] [place], [place] [address]    Salicylate Lvl 04/27/2023 <7.0 (L)  7.0 - 30.0 mg/dL Final   Performed at [facility], [address], [place], [place] [address]   Acetaminophen (Tylenol), Serum 04/27/2023 <10 (L)  10 - 30 ug/mL Final   Comment: (NOTE) Therapeutic concentrations vary significantly. A range of 10-30 ug/mL  may be an effective concentration for many patients. However, some  are best treated at concentrations outside of this range. Acetaminophen concentrations >150 ug/mL at 4 hours after ingestion  and >50 ug/mL at 12 hours after ingestion are often associated with  toxic reactions.  Performed at [facility], [address] [place], [place] [address]    WBC 04/27/2023 7.1  4.0 - 10.5 K/uL Final   RBC 04/27/2023 5.41  4.22 - 5.81 MIL/uL Final   Hemoglobin 04/27/2023 16.1  13.0 - 17.0 g/dL Final   HCT 88/41/6606 47.5  39.0 -  52.0 % Final   MCV 04/27/2023 87.8  80.0 - 100.0 fL Final   MCH 04/27/2023 29.8  26.0 - 34.0 pg Final   MCHC 04/27/2023 33.9  30.0 - 36.0 g/dL Final   RDW 30/16/0109 13.5  11.5 - 15.5 % Final   Platelets 04/27/2023 226  150 - 400 K/uL Final   nRBC 04/27/2023 0.0  0.0 - 0.2 % Final   Performed at [facility], [address], [place], [place] [address]   Opiates 04/27/2023 NONE DETECTED  NONE DETECTED Final   Cocaine 04/27/2023 NONE DETECTED  NONE DETECTED Final   Benzodiazepines 04/27/2023 NONE DETECTED  NONE DETECTED Final   Amphetamines 04/27/2023 NONE DETECTED  NONE DETECTED Final   Tetrahydrocannabinol 04/27/2023 POSITIVE (A)  NONE DETECTED Final   Barbiturates 04/27/2023 NONE DETECTED  NONE  DETECTED Final   Comment: (NOTE) DRUG SCREEN FOR MEDICAL PURPOSES ONLY.  IF CONFIRMATION IS NEEDED FOR ANY PURPOSE, NOTIFY LAB WITHIN 5 DAYS.  LOWEST DETECTABLE LIMITS FOR URINE DRUG SCREEN Drug Class                     Cutoff (ng/mL) Amphetamine and metabolites    1000 Barbiturate and metabolites    200 Benzodiazepine                 200 Opiates and metabolites        300 Cocaine and metabolites        300 THC                            50 Performed at [facility], [address], [place], [place] [address]     Blood Alcohol level:  Lab Results  Component Value Date   [facility] <10 08/30/2023   ETH <10 07/29/2023    Metabolic Disorder Labs: Lab Results  Component Value Date   HGBA1C 5.3 04/30/2023   MPG 105 04/30/2023   MPG 105 08/28/2018   Lab Results  Component Value Date   PROLACTIN 6.7 08/28/2018   PROLACTIN 17.3 (H) 05/17/2017   Lab Results  Component Value Date   CHOL 168 04/30/2023   TRIG 108 04/30/2023   HDL 55 04/30/2023   CHOLHDL 3.1 04/30/2023   VLDL 22 04/30/2023   LDLCALC 91 04/30/2023   LDLCALC 104 (H) 08/28/2018    Therapeutic Lab Levels: No results found for: "LITHIUM" No results found for: "VALPROATE" No results found for: "CBMZ"  Physical  Findings   AIMS    Flowsheet Row [facility]) [facility] Ad[facility]) [facility]  AIMS Total Score 0 0      AUDIT    Flowsheet Row [facility]) [facility] Admission (Discharged) [facility]23/2023 [facility] Admission (Discharged) [facility] Admission (Discharged) [facility] Ad[facility] (Dis[facility]) [facility]  Alcohol Use Disorder Identification Test Final Score (AUDIT) 0 0 0 0 0      PHQ2-9    Flowsheet Row ED from 08/31/2023 in [facility]  PHQ-2 Total Score 6  PHQ-9 Total Score 27      Flowsheet Row ED from 08/31/2023 in [facility] ED from 08/30/2023 in [facility] ED from 07/29/2023 in [facility] at [facility]  C-SSRS RISK CATEGORY Low Risk High Risk High Risk        Musculoskeletal  Strength & Muscle Tone: within normal limited Gait & Station: normal  Patient leans: NA   Psychiatric Specialty Exam  Presentation  General Appearance:  Disheveled   Eye Contact: Minimal   Speech: Clear and Coherent; Normal Rate   Speech Volume: Normal   Handedness: Right    Mood and Affect  Mood: Depressed; Anxious   Affect: Congruent; Restricted    Thought Process  Thought Processes: Goal Directed; Coherent   Descriptions of Associations:Intact   Orientation:Full (Time, Place and Person)   Thought Content:Perseveration; Rumination   Diagnosis of Schizophrenia or Schizoaffective disorder in past: No   Duration of  Psychotic Symptoms: NA    Hallucinations:Hallucinations: None   Ideas of Reference:None   Suicidal Thoughts:Suicidal Thoughts: Yes, Passive SI Passive Intent and/or Plan: Without Intent   Homicidal Thoughts:Homicidal Thoughts: No    Sensorium  Memory: Immediate Fair   Judgment: Poor   Insight: Fair    Art therapist  Concentration: Good   Attention Span: Good   Recall: Good   Fund of Knowledge: Good   Language: Good    Psychomotor Activity  Psychomotor Activity: Normal, fidgets when anxious  Assets  Assets: Communication Skills; Desire for Improvement; Leisure Time; Physical Health  Sleep  Sleep: Sleep: Fair  No data recorded  Physical Exam  Physical Exam Vitals and nursing note reviewed.  HENT:     Head: Normocephalic and atraumatic.  Pulmonary:     Effort: Pulmonary effort is normal. No respiratory distress.  Neurological:     General: No focal deficit present.     Mental Status: He is alert and oriented to person, place, and time.     Gait: Gait normal.    Blood pressure 102/78, pulse 70, temperature 98.7 F (37.1 C), temperature source Oral, resp. rate 18, SpO2 100%. There is no height or weight on file to calculate BMI.  Treatment Plan Summary: Daily contact with patient to assess and evaluate symptoms and progress in treatment and Medication management  Housing instability Lives in a tent. Talk to CSW for housing / work resources   MDD  GAD  SIB  Chronic SI Multiple inpatient psych admission this year, biggest stressor is housing instability.  Last time in psych hospital was 1.5 weeks prior to [facility] admission, was at [facility]. He was to follow-up with outpatient psychiatrist which he doesn't remember name/office currently. Does not meet criteria for inpatient psych at this time. Mood stable, improved some, no med changes Continued home remeron 15 mg at bedtime Continued home effexor 150 mg daily  Nicotine use d/o  Cannabis use  d/o Encouraged cessation NRTs  PRN Rx: acetaminophen, 650 mg, Q6H PRN alum & mag hydroxide-simeth, 30 mL, Q4H PRN hydrOXYzine, 50 mg, TID PRN magnesium hydroxide, 30 mL, Daily PRN traZODone, 50 mg, QHS PRN     Princess Bruins, DO Psych Resident, PGY-3 09/02/2023 2:24 PM

## 2023-09-01 NOTE — ED Notes (Signed)
MHT did round pt was upset about living situations so we talked for a while and he's feeling better.

## 2023-09-01 NOTE — ED Notes (Signed)
Patient requested medication for aniexty

## 2023-09-01 NOTE — ED Notes (Signed)
Patient states the aniexty medication hep some.

## 2023-09-01 NOTE — ED Notes (Signed)
Patient in milieu. Environment is secured. Will continue to monitor for safety. 

## 2023-09-01 NOTE — ED Notes (Signed)
Patient requested aniexty medication atarax was given

## 2023-09-01 NOTE — Group Note (Signed)
Group Topic: Relaxation  Group Date: 09/01/2023 Start Time: 1130 End Time: 1210 Facilitators: Londell Moh, NT  Department: [facility]  Number of Participants: 7  Group Focus: check in, daily focus, and relaxation Treatment Modality:  Psychoeducation Interventions utilized were leisure development and patient education Purpose: increase insight  Name: Angel [name] Date of Birth: 11/10/1981  MR: [id number]    Level of Participation: Pt did not attend group Patients Problems:  Patient Active Problem List   Diagnosis Date Noted   Adjustment disorder with depressed mood 08/31/2023   MDD (major depressive disorder), recurrent episode, severe (HCC) 08/31/2023   Housing instability after recent homelessness 06/21/2023   Scabies 06/21/2023   MDD (major depressive disorder) 06/20/2023   Suicidal ideation 05/03/2023   GERD (gastroesophageal reflux disease) 03/11/2018   Tobacco use disorder 07/17/2015   Generalized anxiety disorder 11/01/2013   MDD (major depressive disorder), recurrent severe, without psychosis (HCC) 10/31/2013

## 2023-09-01 NOTE — ED Notes (Signed)
Pt was provided breakfast.

## 2023-09-01 NOTE — ED Notes (Signed)
Pt was provided dinner.

## 2023-09-02 ENCOUNTER — Encounter (HOSPITAL_COMMUNITY): Payer: Self-pay | Admitting: Dietician

## 2023-09-02 DIAGNOSIS — Z87891 Personal history of nicotine dependence: Secondary | ICD-10-CM | POA: Diagnosis not present

## 2023-09-02 DIAGNOSIS — R45851 Suicidal ideations: Secondary | ICD-10-CM | POA: Diagnosis not present

## 2023-09-02 DIAGNOSIS — F332 Major depressive disorder, recurrent severe without psychotic features: Secondary | ICD-10-CM | POA: Diagnosis not present

## 2023-09-02 DIAGNOSIS — Z79899 Other long term (current) drug therapy: Secondary | ICD-10-CM | POA: Diagnosis not present

## 2023-09-02 NOTE — ED Notes (Signed)
Patient observed resting quietly, eyes closed. Respirations equal and unlabored. Will continue to monitor for safety.  

## 2023-09-02 NOTE — ED Notes (Signed)
Pt is in the bed resting. Respirations are even and unlabored. No acute distress noted. Will continue to monitor for safety

## 2023-09-02 NOTE — ED Notes (Signed)
Patient A&Ox4. Denies intent to harm self/others when asked. Denies A/VH. Pt does indorse SI with no plan or intent. Patient denies any physical complaints. No acute distress observed. Routine safety checks conducted according to facility protocol. Patient agreed to notify staff if thoughts of harm toward self or others arise. Will continue to monitor for safety.

## 2023-09-02 NOTE — ED Notes (Signed)
 Patient in the bedroom sleeping. NAD.  Respirations are even and unlabored. Will continue to monitor for safety.

## 2023-09-02 NOTE — Group Note (Signed)
Group Topic: Overcoming Obstacles  Group Date: 09/02/2023 Start Time: 1000 End Time: 1100 Facilitators: Ninfa Linden, NT+3 MHT 2  Department: [facility]  Number of Participants: 5  Group Focus: coping skills Treatment Modality:  Behavior Modification Therapy Interventions utilized were problem solving Purpose: trigger / craving management  Name: Angel [name] Date of Birth: [birth year]  MR: [id number]    Level of Participation: active Quality of Participation: engaged Interactions with others: gave feedback Mood/Affect: positive Triggers (if applicable): He did not specify his triggers it may be his friends. Cognition: coherent/clear Progress: Significant Response: He was active and open with his answers. Plan: patient will be encouraged to work on his self esteem.  Patients Problems:  Patient Active Problem List   Diagnosis Date Noted   Adjustment disorder with depressed mood 08/31/2023   MDD (major depressive disorder), recurrent episode, severe (HCC) 08/31/2023   Housing instability after recent homelessness 06/21/2023   Scabies 06/21/2023   MDD (major depressive disorder) 06/20/2023   Suicidal ideation 05/03/2023   GERD (gastroesophageal reflux disease) 03/11/2018   Tobacco use disorder 07/17/2015   Generalized anxiety disorder 11/01/2013   MDD (major depressive disorder), recurrent severe, without psychosis (HCC) 10/31/2013

## 2023-09-02 NOTE — ED Notes (Signed)
Patient observed/assessed at bedside as patient asleep in room upon approach. Patient alert and oriented x 4. Affect is flat. Patient denies pain and anxiety. He denies A/V/H. He denies having any thoughts/plan of self harm and harm towards others. Fluid and snack offered. Patient states that appetite has been good throughout the day. Verbalizes no further complaints at this time. Will continue to monitor and support.

## 2023-09-02 NOTE — Group Note (Signed)
Group Topic: Communication  Group Date: 09/02/2023 Start Time: 2000 End Time: 2100 Facilitators: Rae Lips B  Department: [facility]  Number of Participants: 5  Group Focus: acceptance, activities of daily living skills, chemical dependency education, chemical dependency issues, clarity of thought, co-dependency, communication, community group, and coping skills Treatment Modality:  Leisure Development Interventions utilized were leisure development Purpose: express feelings  Name: Angel [name] Date of Birth: Apr 02, 1981  MR: [id number]    Level of Participation: PT DID NOT ATTEND GROUP Quality of Participation: withdrawn Interactions with others: gave feedback Mood/Affect: appropriate Triggers (if applicable): NA Cognition: coherent/clear Progress: Minimal Response: NA Plan: patient will be encouraged to go to groups.   Patients Problems:  Patient Active Problem List   Diagnosis Date Noted   Adjustment disorder with depressed mood 08/31/2023   MDD (major depressive disorder), recurrent episode, severe (HCC) 08/31/2023   Housing instability after recent homelessness 06/21/2023   Scabies 06/21/2023   MDD (major depressive disorder) 06/20/2023   Suicidal ideation 05/03/2023   GERD (gastroesophageal reflux disease) 03/11/2018   Tobacco use disorder 07/17/2015   Generalized anxiety disorder 11/01/2013   MDD (major depressive disorder), recurrent severe, without psychosis (HCC) 10/31/2013

## 2023-09-03 DIAGNOSIS — Z87891 Personal history of nicotine dependence: Secondary | ICD-10-CM | POA: Diagnosis not present

## 2023-09-03 DIAGNOSIS — R45851 Suicidal ideations: Secondary | ICD-10-CM | POA: Diagnosis not present

## 2023-09-03 DIAGNOSIS — Z79899 Other long term (current) drug therapy: Secondary | ICD-10-CM | POA: Diagnosis not present

## 2023-09-03 DIAGNOSIS — F332 Major depressive disorder, recurrent severe without psychotic features: Secondary | ICD-10-CM | POA: Diagnosis not present

## 2023-09-03 NOTE — ED Notes (Signed)
Patient is sleeping. Respirations equal and unlabored, skin warm and dry. No change in assessment or acuity. Routine safety checks conducted according to facility protocol. Will continue to monitor for safety.   

## 2023-09-03 NOTE — Discharge Instructions (Signed)
[facility] [address] [place], [place], [address] [phone] phone  New Patient Assessment/Therapy Walk-Ins:  Monday and Wednesday: 8 am until slots are full. Every 1st and 2nd Fridays of the month: 1 pm - 5 pm.  NO ASSESSMENT/THERAPY WALK-INS ON TUESDAYS OR THURSDAYS  New Patient Assessment/Medication Management Walk-Ins:  Monday - Friday:  8 am - 11 am.  For all walk-ins, we ask that you arrive by 7:30 am because patients will be seen in the order of arrival.  Availability is limited; therefore, you may not be seen on the same day that you walk-in.  Our goal is to serve and meet the needs of our community to the best of our ability.  SUBSTANCE USE TREATMENT for Medicaid and State Funded/IPRS  [facility] [address], [place], [address] [phone] phone NOTE: [facility] is no longer offering IOP services.  Serves those who are low-income or have no insurance.  [facility] [address], [place], [place], [address] [phone] phone [phone] fax NOTE: Does have Substance Abuse-Intensive Outpatient Program (SAIOP) as well as transitional housing if eligible.  [facility] [address] [place], [place], [address] [phone] phone [phone] fax  [facility] [address] [place], [place], [address] [phone] phone [phone] fax  HALFWAY HOUSES:  [facility] [phone]  [facility] www.oxfordvacancies.com  12 STEP PROGRAMS:  Alcoholics Anonymous of [place] https://aagreensboronc.com/meeting  Narcotics Anonymous of [place] https://greensborona.org/meetings/  Al-Anon of [place], [place] www.greens[place]alanon.org/find-meetings.html  Nar-Anon https://nar-anon.org/find-a-meetin  List of Residential placements:   [facility] in [place]: [phone]  [facility]: [phone]  [facility]: [place], [place]  [phone]: Male and male facility; 30-day program: (uninsured and Medicaid such as [facility], [facility], [facility], partners)  [facility]: [phone]; men and women's facility; 28 days; Can have Medicaid tailored plan ([facility] or [facility])  [facility]: [phone] Ang[place] or [place]; 28 day program; must be fully detox; tailored Medicaid or no insurance  [facility] in [place], [place]; [phone]; 28 day all males program; no insurance accepted  [facility] in [place]: Joe [phone] (no insurance or Medicaid only); 90 days; outpatient services but provide housing in apartments [place]  [facility] Admission: [phone]: Patient must complete phone screening for placement: [place], [place]; 6 month program; uninsured, Medicaid, and [facility] insurance.   [facility]: no insurance required; [phone]  [place] [facility]: [phone]; Intake: Robert; Must fill out application online; Victor Delay [phone] x [phone]  [facility] in [place], [place]: [phone]; Admissions Coordinators Mr. Dennis or David Gibson; 90 day program.  [facility]: [place], [place] [phone]; Co-Ed 9 month to a year program; Online application; Men entry fee is $500 (6-12months);  [facility]: [address] [place], [place] [address]; no fee or insurance required; minimum of 2 years; Highly structured; work based; Intake Coordinator is Chris [phone]  [facility] in [place], [place]: [phone]; Fax number is [phone]; website: www.[facility].org; Requires 3-6 page autobiography; 2 year program (18 months and then 6month transitional housing); Admission fee is $300; no insurance needed; work program  [facility] in [place], [place]: Front Desk Staff: Reeci [phone]: They have a Men's Regenerations Program 6-9months. Free program; There is an initial $300 fee however, they are willing to work  with patients regarding that. Application is online.  [facility]: Admissions [phone] Benjamin Cox ext [phone]; Any 7-90 day program is out of pocket; 12   month program is free of charge; there is a $275 entry fee; Patient is responsible for own transportation 

## 2023-09-03 NOTE — Progress Notes (Signed)
Pt was visible in the milieu and continues to present flat affect. Pt complained of anxiety and was medicated X2 per order. No distress noted or other concerns voiced. Staff will monitor for pt's safety.

## 2023-09-03 NOTE — Group Note (Signed)
Group Topic: Relapse and Recovery  Group Date: 09/03/2023 Start Time: 1000 End Time: 1100 Facilitators: Bertram Denver  Department: [facility]  Number of Participants: 5  Group Focus: community group Treatment Modality:  Cognitive Behavioral Therapy Interventions utilized were group exercise Purpose: express feelings  Name: Angel [name] Date of Birth: 07-22-1981  MR: [id number]    Level of Participation: moderate Quality of Participation: attentive Interactions with others: gave feedback Mood/Affect: appropriate Triggers (if applicable): na Cognition: concrete Progress: Moderate Response: na Plan: follow-up needed  Patients Problems:  Patient Active Problem List   Diagnosis Date Noted   Adjustment disorder with depressed mood 08/31/2023   MDD (major depressive disorder), recurrent episode, severe (HCC) 08/31/2023   Housing instability after recent homelessness 06/21/2023   Scabies 06/21/2023   MDD (major depressive disorder) 06/20/2023   Suicidal ideation 05/03/2023   GERD (gastroesophageal reflux disease) 03/11/2018   Tobacco use disorder 07/17/2015   Generalized anxiety disorder 11/01/2013   MDD (major depressive disorder), recurrent severe, without psychosis (HCC) 10/31/2013

## 2023-09-03 NOTE — ED Notes (Signed)
Patient is in the bedroom sleeping.NAD.  Respirations are even and unlabored.  Will continue to monitor for safety.

## 2023-09-03 NOTE — Group Note (Signed)
Group Topic: Wellness  Group Date: 09/03/2023 Start Time: 2000 End Time: 2030 Facilitators: Guss Bunde  Department: [facility]  Number of Participants: 5  Group Focus: check in Treatment Modality:  Leisure Development Interventions utilized were support Purpose: reinforce self-care  Name: Angel [name] Date of Birth: 05/05/1981  MR: [id number]    Level of Participation: active Quality of Participation: cooperative Interactions with others: gave feedback Mood/Affect: appropriate Triggers (if applicable):  Cognition: goal directed Progress: Moderate Response: Pt wants to be discharged and wants to do whatever he needs to leave Plan: patient will be encouraged to follow directions of program  Patients Problems:  Patient Active Problem List   Diagnosis Date Noted   Adjustment disorder with depressed mood 08/31/2023   MDD (major depressive disorder), recurrent episode, severe (HCC) 08/31/2023   Housing instability after recent homelessness 06/21/2023   Scabies 06/21/2023   MDD (major depressive disorder) 06/20/2023   Suicidal ideation 05/03/2023   GERD (gastroesophageal reflux disease) 03/11/2018   Tobacco use disorder 07/17/2015   Generalized anxiety disorder 11/01/2013   MDD (major depressive disorder), recurrent severe, without psychosis (HCC) 10/31/2013

## 2023-09-03 NOTE — Progress Notes (Signed)
Pt is awake, alert and oriented X3. Pt did not voice any complaints of pain or discomfort however complained of anxiety. No signs of acute distress noted. PRN Hydroxyzine and scheduled meds administered per order. Pt endorses SI with no plan. Pt verbally contracts for safety on the unit. Pt denies HI/AVH. Staff will monitor for pt's safety.

## 2023-09-03 NOTE — Tx Team (Signed)
LCSW met with patient to assess current mood, affect, physical state, and inquire about needs/goals while here in [facility] and after discharge. Patient reports he presented due to having SI with a plan to jump off a bridge. Patient reports he has been feeling this way for the last 5 days. Patient denies currently having SI, stating "the thoughts come every now and then and I feel like a waste of space". LCSW provided brief counseling to the patient and he was receptive to the feedback provided. Patient reports he has been homeless for the past 4 months, and reports prior this he was staying with some friends. Patient reports being left stranded by himself after "all of his friends just up and left". Patient reports at one period he was residing with his Sister in [place] until she "left him and his dog at a sheets gas station". Patient reports he has not been in contact with his sister since and reports not having a good relationship with his brother. Patient reports he has no family support and reports his biggest barriers has been getting his license in order to find himself a job. Patient reports he has called multiple facilities and resources around where he was living to get additional help, however reports feeling back in a corner with no where to go. Patient informed this Clinician that if he returns back to his tent, he will jump off of a bridge. Patient reports his goal is to get assistance with housing as that is also one of his stressors. Patient reports he worries all the time and reports he is scared of what the future holds as " the winters are getting colder". Patient aware that LCSW will explore options for placement and provide additional resources for his follow up. No other needs were reported at this time by patient.   LCSW will continue to follow and provide support to patient while on [facility] unit.   Fernande Boyden, LCSW Clinical Social Worker [facility] Ph: [phone]

## 2023-09-03 NOTE — ED Notes (Signed)
Patient is in the dayroom watching TV with other patients. NAD. Denies SI/HI & AVH. Respirations are even and unlabored. Will continue to monitor for safety.

## 2023-09-03 NOTE — ED Provider Notes (Signed)
Behavioral Health Progress Note  Date and Time: 09/03/2023 2:29 PM Name: Angel [name] MRN:  [id number]  Subjective:  On interview today patient reports concerns about housing situation and suicidal thoughts stemming from this.  We discussed that he may be able to get into a shelter locally which would give him the stability to start working and continued to have stability in his life.  He reports that he does not have his license and when his short-term goals is to get a new one.  He reports that he has his birth certificate and Social Security card.  He is open to staying in the [place] so they go back to his tent in [place].  We discussed that he has some past cannabis use which could potentially make him a candidate for transitional living facilities but he reports that he has not used recently and he had a negative UDS for THC.  He reports that he has been eating and sleeping okay.  He reports no side effects to the venlafaxine or mirtazapine or other medications.  During the day he was noted to be making phone calls to resources that he was provided for local shelters and other supportive resources.  Diagnosis:  Final diagnoses:  Severe episode of recurrent major depressive disorder, without psychotic features (HCC)  Homelessness    Total Time spent with patient: 30 minutes  Past Psychiatric History:  Previous Psych Diagnoses: MDD, GAD, tobacco use d/o, cannabis use d/o, SIB, chronic SI Prior inpatient treatment:  Admitted to psych hospital for SI 07/2023 [facility], [facility] 05/2023, [facility] 04/2023, [facility] 04/2023. Admitted to behavioral health in 08/2018, for suicidal attempt, 03/09/2018 admitted to behavioral health for suicidal ideations, and 05/16/2017 admitted to behavioral health for suicidal ideation, 10/31/2013 admitted to behavioral health for suicidal ideation.  Reported being admitted to behavioral health in [place] but does not remember the  year. Current/prior outpatient treatment: yes, med management Prior rehab hx: Denies Psychotherapy hx: Yes History of suicide: 1 suicide attempt by wrist cutting  History of homicide or aggression: Denies Psychiatric medication history: Fluoxetine which helped his anxiety, Vistaril which helps with anxiety, Remeron which helped his sleep, no reported medications that he reports poor outcomes from  Psychiatric medication compliance history: Noncompliant due to financial difficulty Neuromodulation history: Denies    Substance Abuse Hx: Alcohol: No current or past use Tobacco: Occasional use of cigarettes Illicit drugs: No current or past use of illicit substances except occasional cannabis use. Rx drug abuse: No past or current use Rehab hx: No history of substance rehab   Past Medical History: Medical Diagnoses: Migraines treated with Tylenol extra-strength Home Rx: No home meds Prior Hosp: No reported medical hospitalizations Prior Surgeries/Trauma: Reports no surgeries Head trauma, LOC, concussions, seizures: No history of TBI or seizures.  Allergies: No known drug allergies PCP: No current PCP   Family History: Medical: Does not report any medical family history Psych: Does not report any mental health issues or substance use except for MDD and aunt Psych Rx: No reported medication responses SA/HA: No reported family history of SA or HA Substance use family hx: None reported   Social History: Childhood (bring, raised, lives now, parents, siblings, schooling, education): Does not go into details on his childhood but has a very poor relationship with his family currently.  Reports having a brother. Abuse: Does not report any physical, emotional, sexual abuse anytime in life. Marital Status: Single Sexual orientation: Did not report  Children: No children Employment: Currently unemployed.  Did not go into detail about his past employment Peer Group: Reports having no social  support or peer group. Housing: Also living a house with his previous friends when they abandoned him and he was forced to leave the house. Finances: Significant financial issues Legal: No current legal issues. Military: Did not ask the patient.  Current Medications:  Current Facility-Administered Medications  Medication Dose Route Frequency Provider Last Rate Last Admin   acetaminophen (TYLENOL) tablet 650 mg  650 mg Oral Q6H PRN Bing Neighbors, NP       alum & mag hydroxide-simeth (MAALOX/MYLANTA) 200-200-20 MG/5ML suspension 30 mL  30 mL Oral Q4H PRN Bing Neighbors, NP       hydrOXYzine (ATARAX) tablet 50 mg  50 mg Oral TID PRN Princess Bruins, DO   50 mg at 09/03/23 2130   magnesium hydroxide (MILK OF MAGNESIA) suspension 30 mL  30 mL Oral Daily PRN Bing Neighbors, NP       mirtazapine (REMERON) tablet 15 mg  15 mg Oral QHS Bing Neighbors, NP   15 mg at 09/02/23 2131   nicotine (NICODERM CQ - dosed in mg/24 hours) patch 21 mg  21 mg Transdermal Daily Princess Bruins, DO   21 mg at 09/03/23 0838   traZODone (DESYREL) tablet 50 mg  50 mg Oral QHS PRN Bing Neighbors, NP   50 mg at 09/02/23 2131   venlafaxine XR (EFFEXOR-XR) 24 hr capsule 150 mg  150 mg Oral Q breakfast Bing Neighbors, NP   150 mg at 09/03/23 8657   Current Outpatient Medications  Medication Sig Dispense Refill   mirtazapine (REMERON) 15 MG tablet Take 1 tablet (15 mg total) by mouth at bedtime. 30 tablet 0   nicotine (NICODERM CQ - DOSED IN MG/24 HOURS) 14 mg/24hr patch Place 1 patch (14 mg total) onto the skin daily. 28 patch 0   traZODone (DESYREL) 100 MG tablet Take 1 tablet (100 mg total) by mouth at bedtime as needed for sleep. 30 tablet 0   venlafaxine XR (EFFEXOR-XR) 150 MG 24 hr capsule Take 1 capsule (150 mg total) by mouth daily with breakfast. 30 capsule 0    Labs  Lab Results:  Admission on 08/30/2023, Discharged on 08/31/2023  Component Date Value Ref Range Status   Sodium 08/30/2023  137  135 - 145 mmol/L Final   Potassium 08/30/2023 3.2 (L)  3.5 - 5.1 mmol/L Final   Chloride 08/30/2023 103  98 - 111 mmol/L Final   CO2 08/30/2023 24  22 - 32 mmol/L Final   Glucose, Bld 08/30/2023 90  70 - 99 mg/dL Final   Glucose reference range applies only to samples taken after fasting for at least 8 hours.   BUN 08/30/2023 14  6 - 20 mg/dL Final   Creatinine, Ser 08/30/2023 0.74  0.61 - 1.24 mg/dL Final   Calcium 84/69/6295 8.8 (L)  8.9 - 10.3 mg/dL Final   Total Protein 28/41/3244 7.1  6.5 - 8.1 g/dL Final   Albumin 11/29/7251 4.0  3.5 - 5.0 g/dL Final   AST 66/44/0347 17  15 - 41 U/L Final   ALT 08/30/2023 23  0 - 44 U/L Final   Alkaline Phosphatase 08/30/2023 73  38 - 126 U/L Final   Total Bilirubin 08/30/2023 0.3  0.3 - 1.2 mg/dL Final   GFR, Estimated 08/30/2023 >60  >60 mL/min Final   Comment: (NOTE) Calculated using the CKD-EPI Creatinine Equation (  2021)    Anion gap 08/30/2023 10  5 - 15 Final   Performed at [facility], [address], [place], [place] [address]   Alcohol, Ethyl (B) 08/30/2023 <10  <10 mg/dL Final   Comment: (NOTE) Lowest detectable limit for serum alcohol is 10 mg/dL.  For medical purposes only. Performed at [facility], [address], [place], [place] [address]    Opiates 08/30/2023 NONE DETECTED  NONE DETECTED Final   Cocaine 08/30/2023 NONE DETECTED  NONE DETECTED Final   Benzodiazepines 08/30/2023 NONE DETECTED  NONE DETECTED Final   Amphetamines 08/30/2023 NONE DETECTED  NONE DETECTED Final   Tetrahydrocannabinol 08/30/2023 NONE DETECTED  NONE DETECTED Final   Barbiturates 08/30/2023 NONE DETECTED  NONE DETECTED Final   Comment: (NOTE) DRUG SCREEN FOR MEDICAL PURPOSES ONLY.  IF CONFIRMATION IS NEEDED FOR ANY PURPOSE, NOTIFY LAB WITHIN 5 DAYS.  LOWEST DETECTABLE LIMITS FOR URINE DRUG SCREEN Drug Class                     Cutoff (ng/mL) Amphetamine and metabolites    1000 Barbiturate and metabolites    200 Benzodiazepine                  200 Opiates and metabolites        300 Cocaine and metabolites        300 THC                            50 Performed at [facility], [address] [place], [place] [address]    WBC 08/30/2023 7.3  4.0 - 10.5 K/uL Final   RBC 08/30/2023 4.35  4.22 - 5.81 MIL/uL Final   Hemoglobin 08/30/2023 12.9 (L)  13.0 - 17.0 g/dL Final   HCT 91/47/8295 38.9 (L)  39.0 - 52.0 % Final   MCV 08/30/2023 89.4  80.0 - 100.0 fL Final   MCH 08/30/2023 29.7  26.0 - 34.0 pg Final   MCHC 08/30/2023 33.2  30.0 - 36.0 g/dL Final   RDW 62/13/0865 14.2  11.5 - 15.5 % Final   Platelets 08/30/2023 282  150 - 400 K/uL Final   nRBC 08/30/2023 0.3 (H)  0.0 - 0.2 % Final   Neutrophils Relative % 08/30/2023 32  % Final   Neutro Abs 08/30/2023 2.3  1.7 - 7.7 K/uL Final   Lymphocytes Relative 08/30/2023 54  % Final   Lymphs Abs 08/30/2023 4.0  0.7 - 4.0 K/uL Final   Monocytes Relative 08/30/2023 10  % Final   Monocytes Absolute 08/30/2023 0.7  0.1 - 1.0 K/uL Final   Eosinophils Relative 08/30/2023 3  % Final   Eosinophils Absolute 08/30/2023 0.2  0.0 - 0.5 K/uL Final   Basophils Relative 08/30/2023 1  % Final   Basophils Absolute 08/30/2023 0.1  0.0 - 0.1 K/uL Final   Immature Granulocytes 08/30/2023 0  % Final   Abs Immature Granulocytes 08/30/2023 0.01  0.00 - 0.07 K/uL Final   Performed at [facility], [address] [place], [place] [address]   Sodium 08/31/2023 136  135 - 145 mmol/L Final   Potassium 08/31/2023 3.6  3.5 - 5.1 mmol/L Final   Chloride 08/31/2023 104  98 - 111 mmol/L Final   CO2 08/31/2023 24  22 - 32 mmol/L Final   Glucose, Bld 08/31/2023 94  70 - 99 mg/dL Final   Glucose reference range applies only to samples taken after fasting  for at least 8 hours.   BUN 08/31/2023 13  6 - 20 mg/dL Final   Creatinine, Ser 08/31/2023 0.79  0.61 - 1.24 mg/dL Final   Calcium 95/28/4132 8.3 (L)  8.9 - 10.3 mg/dL Final   GFR, Estimated 08/31/2023 >60  >60 mL/min Final   Comment: (NOTE) Calculated using the  CKD-EPI Creatinine Equation (2021)    Anion gap 08/31/2023 8  5 - 15 Final   Performed at [facility], [address], [place], [place] [address]  Admission on 07/29/2023, Discharged on 07/30/2023  Component Date Value Ref Range Status   Sodium 07/29/2023 135  135 - 145 mmol/L Final   Potassium 07/29/2023 3.3 (L)  3.5 - 5.1 mmol/L Final   Chloride 07/29/2023 102  98 - 111 mmol/L Final   CO2 07/29/2023 24  22 - 32 mmol/L Final   Glucose, Bld 07/29/2023 97  70 - 99 mg/dL Final   Glucose reference range applies only to samples taken after fasting for at least 8 hours.   BUN 07/29/2023 14  6 - 20 mg/dL Final   Creatinine, Ser 07/29/2023 0.75  0.61 - 1.24 mg/dL Final   Calcium 27/25/3664 8.6 (L)  8.9 - 10.3 mg/dL Final   Total Protein 40/34/7425 6.8  6.5 - 8.1 g/dL Final   Albumin 95/63/8756 3.9  3.5 - 5.0 g/dL Final   AST 43/32/9518 21  15 - 41 U/L Final   ALT 07/29/2023 24  0 - 44 U/L Final   Alkaline Phosphatase 07/29/2023 82  38 - 126 U/L Final   Total Bilirubin 07/29/2023 0.5  0.3 - 1.2 mg/dL Final   GFR, Estimated 07/29/2023 >60  >60 mL/min Final   Comment: (NOTE) Calculated using the CKD-EPI Creatinine Equation (2021)    Anion gap 07/29/2023 9  5 - 15 Final   Performed at [facility], [address], [place], [place] [address]   Alcohol, Ethyl (B) 07/29/2023 <10  <10 mg/dL Final   Comment: (NOTE) Lowest detectable limit for serum alcohol is 10 mg/dL.  For medical purposes only. Performed at [facility], [address], [place], [place] [address]    Salicylate Lvl 07/29/2023 <7.0 (L)  7.0 - 30.0 mg/dL Final   Performed at [facility], [address], [place], [place] [address]   Acetaminophen (Tylenol), Serum 07/29/2023 <10 (L)  10 - 30 ug/mL Final   Comment: (NOTE) Therapeutic concentrations vary significantly. A range of 10-30 ug/mL  may be an effective concentration for many patients. However, some  are best treated at concentrations outside of this range. Acetaminophen  concentrations >150 ug/mL at 4 hours after ingestion  and >50 ug/mL at 12 hours after ingestion are often associated with  toxic reactions.  Performed at [facility], [address] [place], [place] [address]    WBC 07/29/2023 10.2  4.0 - 10.5 K/uL Final   RBC 07/29/2023 4.53  4.22 - 5.81 MIL/uL Final   Hemoglobin 07/29/2023 13.0  13.0 - 17.0 g/dL Final   HCT 73/22/0254 39.1  39.0 - 52.0 % Final   MCV 07/29/2023 86.3  80.0 - 100.0 fL Final   MCH 07/29/2023 28.7  26.0 - 34.0 pg Final   MCHC 07/29/2023 33.2  30.0 - 36.0 g/dL Final   RDW 27/04/2375 13.8  11.5 - 15.5 % Final   Platelets 07/29/2023 261  150 - 400 K/uL Final   nRBC 07/29/2023 0.0  0.0 - 0.2 % Final   Performed at [facility], [address] [place],  [place] [address]   Opiates 07/29/2023 NONE DETECTED  NONE DETECTED Final   Cocaine 07/29/2023 NONE DETECTED  NONE DETECTED Final   Benzodiazepines 07/29/2023 NONE DETECTED  NONE DETECTED Final   Amphetamines 07/29/2023 NONE DETECTED  NONE DETECTED Final   Tetrahydrocannabinol 07/29/2023 NONE DETECTED  NONE DETECTED Final   Barbiturates 07/29/2023 NONE DETECTED  NONE DETECTED Final   Comment: (NOTE) DRUG SCREEN FOR MEDICAL PURPOSES ONLY.  IF CONFIRMATION IS NEEDED FOR ANY PURPOSE, NOTIFY LAB WITHIN 5 DAYS.  LOWEST DETECTABLE LIMITS FOR URINE DRUG SCREEN Drug Class                     Cutoff (ng/mL) Amphetamine and metabolites    1000 Barbiturate and metabolites    200 Benzodiazepine                 200 Opiates and metabolites        300 Cocaine and metabolites        300 THC                            50 Performed at [facility], [address] [place], [place] [address]   Admission on 06/20/2023, Discharged on 07/04/2023  Component Date Value Ref Range Status   HIV Screen 4th Generation wRfx 06/22/2023 Non Reactive  Non Reactive Final   Performed at [facility], [address], [place], [place] [address]   RPR Ser Ql 06/22/2023 NON REACTIVE  NON REACTIVE  Final   Performed at [facility], [address], [place], [place] [address]   TSH 06/29/2023 2.636  0.350 - 4.500 uIU/mL Final   Comment: Performed by a 3rd Generation assay with a functional sensitivity of <=0.01 uIU/mL. Performed at [facility], [address] [place], [place] [address]   Admission on 06/20/2023, Discharged on 06/20/2023  Component Date Value Ref Range Status   Sodium 06/20/2023 135  135 - 145 mmol/L Final   Potassium 06/20/2023 3.8  3.5 - 5.1 mmol/L Final   Chloride 06/20/2023 103  98 - 111 mmol/L Final   CO2 06/20/2023 25  22 - 32 mmol/L Final   Glucose, Bld 06/20/2023 97  70 - 99 mg/dL Final   Glucose reference range applies only to samples taken after fasting for at least 8 hours.   BUN 06/20/2023 13  6 - 20 mg/dL Final   Creatinine, Ser 06/20/2023 0.68  0.61 - 1.24 mg/dL Final   Calcium 13/24/4010 9.0  8.9 - 10.3 mg/dL Final   Total Protein 27/25/3664 7.6  6.5 - 8.1 g/dL Final   Albumin 40/34/7425 4.2  3.5 - 5.0 g/dL Final   AST 95/63/8756 40  15 - 41 U/L Final   ALT 06/20/2023 98 (H)  0 - 44 U/L Final   Alkaline Phosphatase 06/20/2023 124  38 - 126 U/L Final   Total Bilirubin 06/20/2023 0.6  0.3 - 1.2 mg/dL Final   GFR, Estimated 06/20/2023 >60  >60 mL/min Final   Comment: (NOTE) Calculated using the CKD-EPI Creatinine Equation (2021)    Anion gap 06/20/2023 7  5 - 15 Final   Performed at [facility], [address] [place], [place] [address]   Alcohol, Ethyl (B) 06/20/2023 <10  <10 mg/dL Final   Comment: (NOTE) Lowest detectable limit for serum alcohol is 10 mg/dL.  For medical purposes only. Performed at [facility], [address], [place], [place]  [address]    Salicylate Lvl 06/20/2023 <7.0 (L)  7.0 - 30.0 mg/dL Final   Performed at [facility], [address], [place], [place] [address]   Acetaminophen (Tylenol), Serum 06/20/2023 <10 (L)  10 - 30 ug/mL Final   Comment: (NOTE) Therapeutic concentrations vary  significantly. A range of 10-30 ug/mL  may be an effective concentration for many patients. However, some  are best treated at concentrations outside of this range. Acetaminophen concentrations >150 ug/mL at 4 hours after ingestion  and >50 ug/mL at 12 hours after ingestion are often associated with  toxic reactions.  Performed at [facility], [address], [place], [place] [address]    WBC 06/20/2023 8.2  4.0 - 10.5 K/uL Final   RBC 06/20/2023 4.73  4.22 - 5.81 MIL/uL Final   Hemoglobin 06/20/2023 13.9  13.0 - 17.0 g/dL Final   HCT 91/47/8295 41.3  39.0 - 52.0 % Final   MCV 06/20/2023 87.3  80.0 - 100.0 fL Final   MCH 06/20/2023 29.4  26.0 - 34.0 pg Final   MCHC 06/20/2023 33.7  30.0 - 36.0 g/dL Final   RDW 62/13/0865 13.8  11.5 - 15.5 % Final   Platelets 06/20/2023 256  150 - 400 K/uL Final   nRBC 06/20/2023 0.0  0.0 - 0.2 % Final   Performed at [facility], [address], [place], [place] [address]   Opiates 06/20/2023 NONE DETECTED  NONE DETECTED Final   Cocaine 06/20/2023 NONE DETECTED  NONE DETECTED Final   Benzodiazepines 06/20/2023 NONE DETECTED  NONE DETECTED Final   Amphetamines 06/20/2023 NONE DETECTED  NONE DETECTED Final   Tetrahydrocannabinol 06/20/2023 NONE DETECTED  NONE DETECTED Final   Barbiturates 06/20/2023 NONE DETECTED  NONE DETECTED Final   Comment: (NOTE) DRUG SCREEN FOR MEDICAL PURPOSES ONLY.  IF CONFIRMATION IS NEEDED FOR ANY PURPOSE, NOTIFY LAB WITHIN 5 DAYS.  LOWEST DETECTABLE LIMITS FOR URINE DRUG SCREEN Drug Class                     Cutoff (ng/mL) Amphetamine and metabolites    1000 Barbiturate and metabolites    200 Benzodiazepine                 200 Opiates and metabolites        300 Cocaine and metabolites        300 THC                            50 Performed at [facility], [address] [place], [place] [address]   Admission on 06/03/2023, Discharged on 06/04/2023  Component Date Value Ref Range Status   Sodium 06/03/2023 134 (L)   135 - 145 mmol/L Final   Potassium 06/03/2023 3.7  3.5 - 5.1 mmol/L Final   Chloride 06/03/2023 98  98 - 111 mmol/L Final   CO2 06/03/2023 21 (L)  22 - 32 mmol/L Final   Glucose, Bld 06/03/2023 79  70 - 99 mg/dL Final   Glucose reference range applies only to samples taken after fasting for at least 8 hours.   BUN 06/03/2023 18  6 - 20 mg/dL Final   Creatinine, Ser 06/03/2023 0.76  0.61 - 1.24 mg/dL Final   Calcium 84/13/2440 9.1  8.9 - 10.3 mg/dL Final   Total Protein 09/23/2535 7.3  6.5 - 8.1 g/dL Final   Albumin 64/40/3474 4.4  3.5 - 5.0 g/dL Final   AST 25/95/6387 61 (  H)  15 - 41 U/L Final   ALT 06/03/2023 141 (H)  0 - 44 U/L Final   Alkaline Phosphatase 06/03/2023 79  38 - 126 U/L Final   Total Bilirubin 06/03/2023 1.1  0.3 - 1.2 mg/dL Final   GFR, Estimated 06/03/2023 >60  >60 mL/min Final   Comment: (NOTE) Calculated using the CKD-EPI Creatinine Equation (2021)    Anion gap 06/03/2023 15  5 - 15 Final   Performed at [facility], [address] [place], [place] [address]   Alcohol, Ethyl (B) 06/03/2023 <10  <10 mg/dL Final   Comment: (NOTE) Lowest detectable limit for serum alcohol is 10 mg/dL.  For medical purposes only. Performed at [facility], [address], [place], [place] [address]    WBC 06/03/2023 8.9  4.0 - 10.5 K/uL Final   RBC 06/03/2023 4.80  4.22 - 5.81 MIL/uL Final   Hemoglobin 06/03/2023 14.2  13.0 - 17.0 g/dL Final   HCT 09/81/1914 42.2  39.0 - 52.0 % Final   MCV 06/03/2023 87.9  80.0 - 100.0 fL Final   MCH 06/03/2023 29.6  26.0 - 34.0 pg Final   MCHC 06/03/2023 33.6  30.0 - 36.0 g/dL Final   RDW 78/29/5621 13.6  11.5 - 15.5 % Final   Platelets 06/03/2023 225  150 - 400 K/uL Final   nRBC 06/03/2023 0.0  0.0 - 0.2 % Final   Neutrophils Relative % 06/03/2023 62  % Final   Neutro Abs 06/03/2023 5.6  1.7 - 7.7 K/uL Final   Lymphocytes Relative 06/03/2023 29  % Final   Lymphs Abs 06/03/2023 2.5  0.7 - 4.0 K/uL Final   Monocytes Relative 06/03/2023 8  %  Final   Monocytes Absolute 06/03/2023 0.7  0.1 - 1.0 K/uL Final   Eosinophils Relative 06/03/2023 1  % Final   Eosinophils Absolute 06/03/2023 0.0  0.0 - 0.5 K/uL Final   Basophils Relative 06/03/2023 0  % Final   Basophils Absolute 06/03/2023 0.0  0.0 - 0.1 K/uL Final   Immature Granulocytes 06/03/2023 0  % Final   Abs Immature Granulocytes 06/03/2023 0.03  0.00 - 0.07 K/uL Final   Performed at [facility], [address] [place], [place] [address]   SARS Coronavirus 2 by RT PCR 06/03/2023 NEGATIVE  NEGATIVE Final   Comment: (NOTE) SARS-CoV-2 target nucleic acids are NOT DETECTED.  The SARS-CoV-2 RNA is generally detectable in upper and lower respiratory specimens during the acute phase of infection. The lowest concentration of SARS-CoV-2 viral copies this assay can detect is 250 copies / mL. A negative result does not preclude SARS-CoV-2 infection and should not be used as the sole basis for treatment or other patient management decisions.  A negative result may occur with improper specimen collection / handling, submission of specimen other than nasopharyngeal swab, presence of viral mutation(s) within the areas targeted by this assay, and inadequate number of viral copies (<250 copies / mL). A negative result must be combined with clinical observations, patient history, and epidemiological information.  Fact Sheet for Patients:   RoadLapTop.co.za  Fact Sheet for Healthcare Providers: http://kim-miller.com/  This test is not yet approved or                           cleared by the [place] FDA and has been authorized for detection and/or diagnosis of SARS-CoV-2 by FDA under an Emergency Use Authorization (EUA).  This EUA will remain in effect (meaning  this test can be used) for the duration of the COVID-19 declaration under Section 564(b)(1) of the Act, 21 U.S.C. section 360bbb-3(b)(1), unless the authorization is terminated  or revoked sooner.  Performed at [facility], [address] [place], [place] [address]    Acetaminophen (Tylenol), Serum 06/03/2023 <10 (L)  10 - 30 ug/mL Final   Comment: (NOTE) Therapeutic concentrations vary significantly. A range of 10-30 ug/mL  may be an effective concentration for many patients. However, some  are best treated at concentrations outside of this range. Acetaminophen concentrations >150 ug/mL at 4 hours after ingestion  and >50 ug/mL at 12 hours after ingestion are often associated with  toxic reactions.  Performed at [facility], [address], [place], [place] [address]   Admission on 05/23/2023, Discharged on 05/23/2023  Component Date Value Ref Range Status   Sodium 05/23/2023 137  135 - 145 mmol/L Final   Potassium 05/23/2023 3.5  3.5 - 5.1 mmol/L Final   Chloride 05/23/2023 105  98 - 111 mmol/L Final   CO2 05/23/2023 23  22 - 32 mmol/L Final   Glucose, Bld 05/23/2023 111 (H)  70 - 99 mg/dL Final   Glucose reference range applies only to samples taken after fasting for at least 8 hours.   BUN 05/23/2023 12  6 - 20 mg/dL Final   Creatinine, Ser 05/23/2023 0.82  0.61 - 1.24 mg/dL Final   Calcium 95/28/4132 8.9  8.9 - 10.3 mg/dL Final   Total Protein 44/11/270 6.8  6.5 - 8.1 g/dL Final   Albumin 53/66/4403 4.0  3.5 - 5.0 g/dL Final   AST 47/42/5956 32  15 - 41 U/L Final   ALT 05/23/2023 45 (H)  0 - 44 U/L Final   Alkaline Phosphatase 05/23/2023 58  38 - 126 U/L Final   Total Bilirubin 05/23/2023 0.6  0.3 - 1.2 mg/dL Final   GFR, Estimated 05/23/2023 >60  >60 mL/min Final   Comment: (NOTE) Calculated using the CKD-EPI Creatinine Equation (2021)    Anion gap 05/23/2023 9  5 - 15 Final   Performed at [facility], [address] [place], [place] [address]   Alcohol, Ethyl (B) 05/23/2023 <10  <10 mg/dL Final   Comment: (NOTE) Lowest detectable limit for serum alcohol is 10 mg/dL.  For medical purposes only. Performed at [facility], [address] [place], [place] [address]    Salicylate Lvl 05/23/2023 <7.0 (L)  7.0 - 30.0 mg/dL Final   Performed at [facility], [address], [place], [place] [address]   Acetaminophen (Tylenol), Serum 05/23/2023 <10 (L)  10 - 30 ug/mL Final   Comment: (NOTE) Therapeutic concentrations vary significantly. A range of 10-30 ug/mL  may be an effective concentration for many patients. However, some  are best treated at concentrations outside of this range. Acetaminophen concentrations >150 ug/mL at 4 hours after ingestion  and >50 ug/mL at 12 hours after ingestion are often associated with  toxic reactions.  Performed at [facility], [address], [place], [place] [address]    WBC 05/23/2023 5.8  4.0 - 10.5 K/uL Final   RBC 05/23/2023 5.02  4.22 - 5.81 MIL/uL Final   Hemoglobin 05/23/2023 14.9  13.0 - 17.0 g/dL Final   HCT 30/16/0109 44.5  39.0 - 52.0 % Final   MCV 05/23/2023 88.6  80.0 - 100.0 fL Final   MCH 05/23/2023 29.7  26.0 - 34.0 pg Final   MCHC 05/23/2023 33.5  30.0 - 36.0 g/dL Final  RDW 05/23/2023 13.3  11.5 - 15.5 % Final   Platelets 05/23/2023 213  150 - 400 K/uL Final   nRBC 05/23/2023 0.0  0.0 - 0.2 % Final   Performed at [facility], [address], [place], [place] [address]   Opiates 05/23/2023 NONE DETECTED  NONE DETECTED Final   Cocaine 05/23/2023 NONE DETECTED  NONE DETECTED Final   Benzodiazepines 05/23/2023 NONE DETECTED  NONE DETECTED Final   Amphetamines 05/23/2023 NONE DETECTED  NONE DETECTED Final   Tetrahydrocannabinol 05/23/2023 POSITIVE (A)  NONE DETECTED Final   Barbiturates 05/23/2023 NONE DETECTED  NONE DETECTED Final   Comment: (NOTE) DRUG SCREEN FOR MEDICAL PURPOSES ONLY.  IF CONFIRMATION IS NEEDED FOR ANY PURPOSE, NOTIFY LAB WITHIN 5 DAYS.  LOWEST DETECTABLE LIMITS FOR URINE DRUG SCREEN Drug Class                     Cutoff (ng/mL) Amphetamine and metabolites    1000 Barbiturate and metabolites    200 Benzodiazepine                 200 Opiates and  metabolites        300 Cocaine and metabolites        300 THC                            50 Performed at [facility], [address], [place], [place] [address]   Admission on 04/28/2023, Discharged on 05/03/2023  Component Date Value Ref Range Status   Cholesterol 04/30/2023 168  0 - 200 mg/dL Final   Triglycerides 62/95/2841 108  <150 mg/dL Final   HDL 32/44/0102 55  >40 mg/dL Final   Total CHOL/HDL Ratio 04/30/2023 3.1  RATIO Final   VLDL 04/30/2023 22  0 - 40 mg/dL Final   LDL Cholesterol 04/30/2023 91  0 - 99 mg/dL Final   Comment:        Total Cholesterol/HDL:CHD Risk Coronary Heart Disease Risk Table                     Men   Women  1/2 Average Risk   3.4   3.3  Average Risk       5.0   4.4  2 X Average Risk   9.6   7.1  3 X Average Risk  23.4   11.0        Use the calculated Patient Ratio above and the CHD Risk Table to determine the patient's CHD Risk.        ATP III CLASSIFICATION (LDL):  <100     mg/dL   Optimal  725-366  mg/dL   Near or Above                    Optimal  130-159  mg/dL   Borderline  440-347  mg/dL   High  >425     mg/dL   Very High Performed at [facility], [address] [place], [place] [address]    Hgb A1c MFr Bld 04/30/2023 5.3  4.8 - 5.6 % Final   Comment: (NOTE)         Prediabetes: 5.7 - 6.4         Diabetes: >6.4         Glycemic control for adults with diabetes: <7.0    Mean Plasma Glucose 04/30/2023 105  mg/dL Final   Comment: (NOTE)  Performed At: [facility] [address] [place], [place] [id number] Jolene Schimke MD [id number]   Admission on 04/27/2023, Discharged on 04/28/2023  Component Date Value Ref Range Status   Sodium 04/27/2023 135  135 - 145 mmol/L Final   Potassium 04/27/2023 3.7  3.5 - 5.1 mmol/L Final   Chloride 04/27/2023 99  98 - 111 mmol/L Final   CO2 04/27/2023 25  22 - 32 mmol/L Final   Glucose, Bld 04/27/2023 79  70 - 99 mg/dL Final   Glucose reference range applies only  to samples taken after fasting for at least 8 hours.   BUN 04/27/2023 10  6 - 20 mg/dL Final   Creatinine, Ser 04/27/2023 0.87  0.61 - 1.24 mg/dL Final   Calcium 95/28/4132 9.3  8.9 - 10.3 mg/dL Final   Total Protein 44/11/270 7.8  6.5 - 8.1 g/dL Final   Albumin 53/66/4403 4.7  3.5 - 5.0 g/dL Final   AST 47/42/5956 21  15 - 41 U/L Final   ALT 04/27/2023 19  0 - 44 U/L Final   Alkaline Phosphatase 04/27/2023 65  38 - 126 U/L Final   Total Bilirubin 04/27/2023 0.9  0.3 - 1.2 mg/dL Final   GFR, Estimated 04/27/2023 >60  >60 mL/min Final   Comment: (NOTE) Calculated using the CKD-EPI Creatinine Equation (2021)    Anion gap 04/27/2023 11  5 - 15 Final   Performed at [facility], [address] [place], [place] [address]   Alcohol, Ethyl (B) 04/27/2023 <10  <10 mg/dL Final   Comment: (NOTE) Lowest detectable limit for serum alcohol is 10 mg/dL.  For medical purposes only. Performed at [facility], [address] [place], [place] [address]    Salicylate Lvl 04/27/2023 <7.0 (L)  7.0 - 30.0 mg/dL Final   Performed at [facility], [address], [place], [place] [address]   Acetaminophen (Tylenol), Serum 04/27/2023 <10 (L)  10 - 30 ug/mL Final   Comment: (NOTE) Therapeutic concentrations vary significantly. A range of 10-30 ug/mL  may be an effective concentration for many patients. However, some  are best treated at concentrations outside of this range. Acetaminophen concentrations >150 ug/mL at 4 hours after ingestion  and >50 ug/mL at 12 hours after ingestion are often associated with  toxic reactions.  Performed at [facility], [address] [place], [place] [address]    WBC 04/27/2023 7.1  4.0 - 10.5 K/uL Final   RBC 04/27/2023 5.41  4.22 - 5.81 MIL/uL Final   Hemoglobin 04/27/2023 16.1  13.0 - 17.0 g/dL Final   HCT 30/16/0109 47.5  39.0 - 52.0 % Final   MCV 04/27/2023 87.8  80.0 - 100.0 fL Final   MCH 04/27/2023 29.8  26.0 - 34.0 pg Final   MCHC 04/27/2023 33.9  30.0 -  36.0 g/dL Final   RDW 32/35/5732 13.5  11.5 - 15.5 % Final   Platelets 04/27/2023 226  150 - 400 K/uL Final   nRBC 04/27/2023 0.0  0.0 - 0.2 % Final   Performed at [facility], [address], [place], [place] [address]   Opiates 04/27/2023 NONE DETECTED  NONE DETECTED Final   Cocaine 04/27/2023 NONE DETECTED  NONE DETECTED Final   Benzodiazepines 04/27/2023 NONE DETECTED  NONE DETECTED Final   Amphetamines 04/27/2023 NONE DETECTED  NONE DETECTED Final   Tetrahydrocannabinol 04/27/2023 POSITIVE (A)  NONE DETECTED Final   Barbiturates 04/27/2023 NONE DETECTED  NONE DETECTED Final   Comment: (NOTE) DRUG SCREEN FOR MEDICAL PURPOSES ONLY.  IF CONFIRMATION IS NEEDED FOR ANY PURPOSE, NOTIFY LAB WITHIN 5 DAYS.  LOWEST DETECTABLE LIMITS FOR URINE DRUG SCREEN Drug Class                     Cutoff (ng/mL) Amphetamine and metabolites    1000 Barbiturate and metabolites    200 Benzodiazepine                 200 Opiates and metabolites        300 Cocaine and metabolites        300 THC                            50 Performed at [facility] [address], [place], [place] [address]     Blood Alcohol level:  Lab Results  Component Value Date   [facility] <10 08/30/2023   ETH <10 07/29/2023    Metabolic Disorder Labs: Lab Results  Component Value Date   HGBA1C 5.3 04/30/2023   MPG 105 04/30/2023   MPG 105 08/28/2018   Lab Results  Component Value Date   PROLACTIN 6.7 08/28/2018   PROLACTIN 17.3 (H) 05/17/2017   Lab Results  Component Value Date   CHOL 168 04/30/2023   TRIG 108 04/30/2023   HDL 55 04/30/2023   CHOLHDL 3.1 04/30/2023   VLDL 22 04/30/2023   LDLCALC 91 04/30/2023   LDLCALC 104 (H) 08/28/2018    Physical Findings   AIMS    Flowsheet Row [facility]) [facility] Ad[facility]) [facility]  AIMS Total Score 0 0      AUDIT    Flowsheet Row  [facility]) [facility] Ad[facility] (Discharged) [facility]23/2023 in [facility] Admission (Discharged) [facility] Ad[facility] ([facility]) [facility]) [facility]  Alcohol Use Disorder Identification Test Final Score (AUDIT) 0 0 0 0 0      PHQ2-9    Flowsheet Row [facility]  PHQ-2 Total Score 6  PHQ-9 Total Score 27      Flowsheet Row ED from 08[facility]31/2023 in [facility] from 08/30/2023 in [facility] ED from 07/29/2023 in [facility] at [facility]  C-SSRS RISK CATEGORY Low Risk High Risk High Risk        Musculoskeletal  Strength & Muscle Tone: within normal limits Gait & Station: normal Patient leans: N/A  Psychiatric Specialty Exam  Presentation  General Appearance:  Appropriate for Environment  Eye Contact: Fair  Speech: Normal Rate  Speech Volume: Normal  Handedness: Right   Mood and Affect  Mood: Depressed  Affect: Congruent; Depressed   Thought Process  Thought Processes: Coherent  Descriptions of Associations:Intact  Orientation:Full (Time, Place and Person)  Thought Content:Logical  Diagnosis of Schizophrenia or Schizoaffective disorder in past: No    Hallucinations:Hallucinations: None  Ideas of Reference:None  Suicidal Thoughts:Suicidal Thoughts: Yes, Active SI Active Intent and/or Plan: Without Intent; With Plan (Plan to jump off a bridge if he goes back to his tent)  Homicidal Thoughts:Homicidal Thoughts: No   Sensorium  Memory: Immediate Fair; Recent Fair; Remote Fair  Judgment: Poor  Insight: Fair   Producer, television/film/video: Good  Attention Span: Good  Recall: Good  Fund of Knowledge: Good  Language: Good   Psychomotor Activity  Psychomotor Activity: Psychomotor Activity: Decreased   Assets  Assets: Desire for Improvement   Sleep  Sleep: Sleep: Fair Number of Hours of Sleep: 8   No data recorded  Physical Exam  Physical Exam Vitals and nursing note reviewed.  Constitutional:      General: He is not in acute distress.    Appearance: He is well-developed.  HENT:     Head: Normocephalic and atraumatic.  Pulmonary:     Effort: Pulmonary effort is normal. No respiratory distress.  Musculoskeletal:        General: No swelling.  Neurological:     General: No focal deficit present.     Mental Status: He is alert.    Review of Systems  Constitutional:  Negative for fever.  Cardiovascular:  Negative for chest pain and palpitations.  Gastrointestinal:  Negative for constipation, diarrhea, nausea and vomiting.  Neurological:  Negative for dizziness, weakness and headaches.  Psychiatric/Behavioral:  Positive for depression.    Blood pressure 118/80, pulse 72, temperature 97.8 F (36.6 C), temperature source Oral, resp. rate 18, SpO2 99%. There is no height or weight on file to calculate BMI.  Treatment Plan Summary: Daily contact with patient to assess and evaluate symptoms and progress in treatment and Medication management   Homelessness  lives in a tent in [place] and now is agreeable to going to a shelter in the [place] area which is an improvement from past encounters.  Transitional lymphocele does not appear to be an option this time due to his remission from cannabis. Plan to get the patient established with shelters in the [place].   MDD, severe, recurrent, without psychotic features  GAD   Chronic SI Chronic suicidal thoughts and does not meet criteria for inpatient.  Psychiatric admissions are primary related to spirits and  homelessness.  Goal of [facility] is to get patient established with local shelters or other resources that can help with housing and stability and underlying cause of mental health conditions. Continued home remeron 15 mg at bedtime Continued home effexor 150 mg daily Continue hydroxyzine 50 mg 3 times daily as needed for anxiety Continue trazodone 50 mg once at night as needed for insomnia   Nicotine use d/o  Cannabis use d/o Encouraged cessation NRTs    Meryl Dare, MD PGY-1 Psychiatry Resident 09/03/2023, 2:29 PM

## 2023-09-04 DIAGNOSIS — F332 Major depressive disorder, recurrent severe without psychotic features: Secondary | ICD-10-CM | POA: Diagnosis not present

## 2023-09-04 DIAGNOSIS — R45851 Suicidal ideations: Secondary | ICD-10-CM | POA: Diagnosis not present

## 2023-09-04 DIAGNOSIS — Z79899 Other long term (current) drug therapy: Secondary | ICD-10-CM | POA: Diagnosis not present

## 2023-09-04 DIAGNOSIS — Z87891 Personal history of nicotine dependence: Secondary | ICD-10-CM | POA: Diagnosis not present

## 2023-09-04 MED ORDER — MIRTAZAPINE 15 MG PO TABS: 15.0000 mg | ORAL_TABLET | Freq: Every day | ORAL | 0 refills | Status: AC

## 2023-09-04 MED ORDER — HYDROXYZINE HCL 50 MG PO TABS: 50.0000 mg | ORAL_TABLET | Freq: Two times a day (BID) | ORAL | 0 refills | Status: AC | PRN

## 2023-09-04 MED ORDER — NICOTINE 21 MG/24HR TD PT24: 21.0000 mg | MEDICATED_PATCH | Freq: Every day | TRANSDERMAL | 0 refills | Status: AC

## 2023-09-04 MED ORDER — VENLAFAXINE HCL ER 150 MG PO CP24: 150.0000 mg | ORAL_CAPSULE | Freq: Every day | ORAL | 0 refills | Status: AC

## 2023-09-04 MED ORDER — TRAZODONE HCL 100 MG PO TABS: 100.0000 mg | ORAL_TABLET | Freq: Every evening | ORAL | 0 refills | Status: AC | PRN

## 2023-09-04 NOTE — ED Notes (Signed)
Patient is in awaking and using the bathroom. NAD. Respirations are even and unlabored. Will continue to monitor for safety.

## 2023-09-04 NOTE — Discharge Summary (Signed)
Rosalie Doctor to be D/C'd  [facility]  per MD order. Discussed with the patient and all questions fully answered. An After Visit Summary was printed and given to the patient. Medication samples and scripts were also given to patient. All belongings returned. Patient escorted out and D/C vis taxi cab. Jammie Troup  Marquis Lunch  09/04/2023 1:32 PM

## 2023-09-04 NOTE — Progress Notes (Signed)
Pt is awake, alert and oriented X3 with flat affect. Pt did not voice any complaints of pain or discomfort. No signs of acute distress noted. Administered scheduled meds per order. Pt continues to endorse SI with plan to hang self. Pt verbally contracts for safety on the unit. Pt denies current HI/AVH. Staff will monitor for pt's safety.

## 2023-09-04 NOTE — Progress Notes (Signed)
Pt denies current SI/HI/AVH, plan or intent on reassessment. Staff will monitor for pt's safety.

## 2023-09-04 NOTE — ED Provider Notes (Addendum)
FBC/OBS ASAP Discharge Summary  Date and Time: 09/04/2023 5:50 PM  Name: Angel [name]  MRN:  [id number]   Discharge Diagnoses:  Final diagnoses:  Severe episode of recurrent major depressive disorder, without psychotic features [facility])  Homelessness   Angel [name] is a 42 year old male with past psychiatric history of MDD, GAD, experiencing homelessness, multiple hospitalizations for suicidal ideation who presented with suicidal thoughts with intent and plan to jump off a bridge.  Subjective: On the day of discharge the patient was experiencing continued depressive and anxiety symptoms consistent with his baseline but seemed to be more hopeful given that we were discussing new options for resources for housing for him.  He does report that if he had to return back to his tent that he would have thoughts to jump off a bridge.  He reports that he is eating and sleeping okay.  He does not report homicidal thoughts or AVH.  He reports that he has access to his birth certificate and Social Security card but not his ID.  He reports that he has a cell phone that works.  He reports that he is able to work but has not been working recently due to his housing instability and mental health issues.  He does not report any significant substance use except for cigarettes currently and cannabis in the recent past.  On discharge his PHQ score was decreased to 13 (from 27 on admission and day 3).  Stay Summary: Patient presented to the behavioral health urgent care with suicidal thoughts with intent and plan to jump off bridge.  He was able to contract for safety and was admitted to the facility based crisis for substance use issues.  While in [facility], he spoke to several shelters and transitional living programs and was ultimately [facility] [place].  No significant changes were made to his psychiatric medications.  He seemed very hopeful at discharge with the opportunity to get housing and work  and stabilize his life.  Psych medications: Continued on venlafaxine 150 mg once daily for mood, GAD Continued on mirtazapine 50 mg once daily for mood, insomnia Continued on hydroxyzine 25 mg 3 times daily as needed for anxiety and switched to just twice daily as needed given he was using it only once or twice a day at most. Continued on trazodone 50 mg once at night as needed for insomnia  Total Time spent with patient: 30 minutes  Past Psychiatric History:  Previous Psych Diagnoses: MDD, GAD, tobacco use d/o, cannabis use d/o, SIB, chronic SI Prior inpatient treatment:  Admitted to psych hospital for SI 07/2023 [facility], [facility] 05/2023, [facility] 04/2023, [facility] 04/2023. Admitted to behavioral health in 08/2018, for suicidal attempt, 03/09/2018 admitted to behavioral health for suicidal ideations, and 05/16/2017 admitted to behavioral health for suicidal ideation, 10/31/2013 admitted to behavioral health for suicidal ideation.  Reported being admitted to behavioral health in [place] but does not remember the year. Current/prior outpatient treatment: yes, med management Prior rehab hx: Denies Psychotherapy hx: Yes History of suicide: 1 suicide attempt by wrist cutting  History of homicide or aggression: Denies Psychiatric medication history: Fluoxetine which helped his anxiety, Vistaril which helps with anxiety, Remeron which helped his sleep, no reported medications that he reports poor outcomes from  Psychiatric medication compliance history: Noncompliant due to financial difficulty Neuromodulation history: Denies    Substance Abuse Hx: Alcohol: No current or past use Tobacco: Occasional use of cigarettes Illicit drugs: No current or past  use of illicit substances except occasional cannabis use. Rx drug abuse: No past or current use Rehab hx: No history of substance rehab   Past Medical History: Medical Diagnoses: Migraines treated with Tylenol extra-strength Home Rx: No home  meds Prior Hosp: No reported medical hospitalizations Prior Surgeries/Trauma: Reports no surgeries Head trauma, LOC, concussions, seizures: No history of TBI or seizures.  Allergies: No known drug allergies PCP: No current PCP   Family History: Medical: Does not report any medical family history Psych: Does not report any mental health issues or substance use except for MDD and aunt Psych Rx: No reported medication responses SA/HA: No reported family history of SA or HA Substance use family hx: None reported   Social History: Childhood (bring, raised, lives now, parents, siblings, schooling, education): Does not go into details on his childhood but has a very poor relationship with his family currently.  Reports having a brother. Abuse: Does not report any physical, emotional, sexual abuse anytime in life. Marital Status: Single Sexual orientation: Did not report Children: No children Employment: Currently unemployed.  Did not go into detail about his past employment Peer Group: Reports having no social support or peer group. Housing: Also living a house with his previous friends when they abandoned him and he was forced to leave the house. Finances: Significant financial issues Legal: No current legal issues. Military: Did not ask the patient.  Tobacco Cessation:  A prescription for an FDA-approved tobacco cessation medication provided at discharge  Current Medications:  Current Facility-Administered Medications  Medication Dose Route Frequency Provider Last Rate Last Admin   acetaminophen (TYLENOL) tablet 650 mg  650 mg Oral Q6H PRN Bing Neighbors, NP       alum & mag hydroxide-simeth (MAALOX/MYLANTA) 200-200-20 MG/5ML suspension 30 mL  30 mL Oral Q4H PRN Bing Neighbors, NP       hydrOXYzine (ATARAX) tablet 50 mg  50 mg Oral TID PRN Princess Bruins, DO   50 mg at 09/04/23 1142   magnesium hydroxide (MILK OF MAGNESIA) suspension 30 mL  30 mL Oral Daily PRN Bing Neighbors, NP       mirtazapine (REMERON) tablet 15 mg  15 mg Oral QHS Bing Neighbors, NP   15 mg at 09/03/23 2117   nicotine (NICODERM CQ - dosed in mg/24 hours) patch 21 mg  21 mg Transdermal Daily Princess Bruins, DO   21 mg at 09/04/23 0907   traZODone (DESYREL) tablet 50 mg  50 mg Oral QHS PRN Bing Neighbors, NP   50 mg at 09/03/23 2116   venlafaxine XR (EFFEXOR-XR) 24 hr capsule 150 mg  150 mg Oral Q breakfast Bing Neighbors, NP   150 mg at 09/04/23 0906   Current Outpatient Medications  Medication Sig Dispense Refill   hydrOXYzine (ATARAX) 50 MG tablet Take 1 tablet (50 mg total) by mouth 2 (two) times daily as needed for anxiety. 30 tablet 0   mirtazapine (REMERON) 15 MG tablet Take 1 tablet (15 mg total) by mouth at bedtime. 30 tablet 0   [START ON 09/05/2023] nicotine (NICODERM CQ - DOSED IN MG/24 HOURS) 21 mg/24hr patch Place 1 patch (21 mg total) onto the skin daily. 28 patch 0   traZODone (DESYREL) 100 MG tablet Take 1 tablet (100 mg total) by mouth at bedtime as needed for sleep. 30 tablet 0   venlafaxine XR (EFFEXOR-XR) 150 MG 24 hr capsule Take 1 capsule (150 mg total) by mouth daily with breakfast. 30  capsule 0    PTA Medications:  PTA Medications  Medication Sig   traZODone (DESYREL) 100 MG tablet Take 1 tablet (100 mg total) by mouth at bedtime as needed for sleep.   mirtazapine (REMERON) 15 MG tablet Take 1 tablet (15 mg total) by mouth at bedtime.   venlafaxine XR (EFFEXOR-XR) 150 MG 24 hr capsule Take 1 capsule (150 mg total) by mouth daily with breakfast.   [START ON 09/05/2023] nicotine (NICODERM CQ - DOSED IN MG/24 HOURS) 21 mg/24hr patch Place 1 patch (21 mg total) onto the skin daily.   hydrOXYzine (ATARAX) 50 MG tablet Take 1 tablet (50 mg total) by mouth 2 (two) times daily as needed for anxiety.   Facility Ordered Medications  Medication   [COMPLETED] hydrOXYzine (ATARAX) tablet 50 mg   acetaminophen (TYLENOL) tablet 650 mg   alum & mag hydroxide-simeth  (MAALOX/MYLANTA) 200-200-20 MG/5ML suspension 30 mL   magnesium hydroxide (MILK OF MAGNESIA) suspension 30 mL   traZODone (DESYREL) tablet 50 mg   venlafaxine XR (EFFEXOR-XR) 24 hr capsule 150 mg   mirtazapine (REMERON) tablet 15 mg   hydrOXYzine (ATARAX) tablet 50 mg   nicotine (NICODERM CQ - dosed in mg/24 hours) patch 21 mg       09/04/2023   12:47 PM 09/03/2023    2:05 PM 08/31/2023    6:00 PM  Depression screen PHQ 2/9  Decreased Interest 1 3 3   Down, Depressed, Hopeless 1 3 3   PHQ - 2 Score 2 6 6   Altered sleeping 1 3 3   Tired, decreased energy 3 3 3   Change in appetite 1 3 3   Feeling bad or failure about yourself  1 3 3   Trouble concentrating 1 3 3   Moving slowly or fidgety/restless 3 3 3   Suicidal thoughts 1 3 3   PHQ-9 Score 13 27 27   Difficult doing work/chores Not difficult at all Very difficult Somewhat difficult    Flowsheet Row ED from 08/31/20[facility] in [facility] ED from 08/30/2023 in [facility] Department at [facility] ED from 07/29/2023 in [name] Emergency Department at [facility]  C-SSRS RISK CATEGORY No Risk High Risk High Risk       Musculoskeletal  Strength & Muscle Tone: within normal limits Gait & Station: normal Patient leans: N/A  Psychiatric Specialty Exam  Presentation  General Appearance:  Appropriate for Environment  Eye Contact: Fair  Speech: Normal Rate  Speech Volume: Normal  Handedness: Right   Mood and Affect  Mood: Depressed; Hopeless (But improved by discharge when found out about acceptance to transitional facility)  Affect: Appropriate; Congruent; Depressed   Thought Process  Thought Processes: Coherent  Descriptions of Associations:Intact  Orientation:Full (Time, Place and Person)  Thought Content:Logical (Ruminating about his "failures" and hopelessness and occasional statements about suicidal thoughts if he were to go back to his tent.)  Diagnosis of  Schizophrenia or Schizoaffective disorder in past: No    Hallucinations:Hallucinations: None  Ideas of Reference:None  Suicidal Thoughts:Suicidal Thoughts: Yes, passive suicidal ideation in the context of homelessness  Homicidal Thoughts:Homicidal Thoughts: No   Sensorium  Memory: Immediate Fair; Recent Fair; Remote Fair  Judgment: Fair  Insight: Fair   Executive Functions  Concentration: Good  Attention Span: Good  Recall: Fair  Fund of Knowledge: Good  Language: Good   Psychomotor Activity  Psychomotor Activity: Psychomotor Activity: Normal   Assets  Assets: Desire for Improvement; Financial Resources/Insurance   Sleep  Sleep: Sleep: Good Number of  Hours of Sleep: 8   No data recorded  Physical Exam  Physical Exam Vitals and nursing note reviewed.  Constitutional:      General: He is not in acute distress.    Appearance: He is well-developed.  HENT:     Head: Normocephalic and atraumatic.  Pulmonary:     Effort: Pulmonary effort is normal. No respiratory distress.  Musculoskeletal:        General: No swelling.  Neurological:     General: No focal deficit present.     Mental Status: He is alert.    Review of Systems  Constitutional:  Negative for fever.  Cardiovascular:  Negative for chest pain and palpitations.  Gastrointestinal:  Negative for constipation, diarrhea, nausea and vomiting.  Neurological:  Negative for dizziness, weakness and headaches.   Blood pressure 127/70, pulse 67, temperature (!) 97.5 F (36.4 C), temperature source Oral, resp. rate 18, SpO2 100%. There is no height or weight on file to calculate BMI.  Demographic Factors:  Male, Caucasian, Low socioeconomic status, Living alone, and Unemployed  Loss Factors: Decrease in vocational status and Financial problems/change in socioeconomic status  Historical Factors: NA  Risk Reduction Factors:   Positive therapeutic relationship  Continued Clinical  Symptoms:  Severe Anxiety and/or Agitation Depression:   Anhedonia More than one psychiatric diagnosis  Cognitive Features That Contribute To Risk:  Closed-mindedness and Thought constriction (tunnel vision)    Suicide Risk:  Mild:  There are no identifiable suicide plans (assuming he does not return to his tent), no associated intent, mild dysphoria and related symptoms, good self-control (both objective and subjective assessment), few other risk factors, and identifiable protective factors, including available and accessible social support (through his association with the transitional living facility).  Plan Of Care/Follow-up recommendations: Would benefit from psychiatric follow-up.  Has prescriptions for the next month at least and can follow up at [facility] as needed or other providers that take Medicaid which the patient actively has.  Disposition: Transferred door-to-door to [facility] in [place]  Meryl Dare, MD PGY-1 Psychiatry Resident 09/04/2023, 5:50 PM

## 2023-09-04 NOTE — Discharge Planning (Signed)
LCSW spoke with Admissions Director Alcario Drought [phone] regarding patient. Patient has been accepted to [facility] 12 month residential program and can admit on today 09/04/2023 by 2:00pm; Transportation will be arranged via [employer] to address: [address] [place], [place] [address]. Number to call for emergencies: [phone]. Patient will need a 7 day supply of medication and 30 day script as able. MD and team made aware of plan. No other needs were reported at this time. LCSW to sign off. Please inform if further LCSW needs arise prior to discharge.   Fernande Boyden, LCSW Clinical Social Worker [facility] Ph: [phone]
